# Patient Record
Sex: Female | Born: 1945 | Race: Black or African American | Hispanic: No | Smoking: Former smoker
Health system: Southern US, Community
[De-identification: ages and names within clinical notes are randomized; demographics above are authoritative.]

## PROBLEM LIST (undated history)

## (undated) DIAGNOSIS — Z9981 Dependence on supplemental oxygen: Secondary | ICD-10-CM

## (undated) DIAGNOSIS — N644 Mastodynia: Secondary | ICD-10-CM

## (undated) DIAGNOSIS — R238 Other skin changes: Secondary | ICD-10-CM

## (undated) DIAGNOSIS — E039 Hypothyroidism, unspecified: Secondary | ICD-10-CM

## (undated) DIAGNOSIS — R233 Spontaneous ecchymoses: Secondary | ICD-10-CM

## (undated) DIAGNOSIS — R0600 Dyspnea, unspecified: Secondary | ICD-10-CM

## (undated) DIAGNOSIS — N189 Chronic kidney disease, unspecified: Secondary | ICD-10-CM

## (undated) DIAGNOSIS — K069 Disorder of gingiva and edentulous alveolar ridge, unspecified: Secondary | ICD-10-CM

## (undated) DIAGNOSIS — E079 Disorder of thyroid, unspecified: Secondary | ICD-10-CM

## (undated) DIAGNOSIS — C801 Malignant (primary) neoplasm, unspecified: Secondary | ICD-10-CM

## (undated) DIAGNOSIS — J449 Chronic obstructive pulmonary disease, unspecified: Secondary | ICD-10-CM

## (undated) DIAGNOSIS — K469 Unspecified abdominal hernia without obstruction or gangrene: Secondary | ICD-10-CM

## (undated) DIAGNOSIS — M1990 Unspecified osteoarthritis, unspecified site: Secondary | ICD-10-CM

## (undated) DIAGNOSIS — M5090 Cervical disc disorder, unspecified, unspecified cervical region: Secondary | ICD-10-CM

## (undated) DIAGNOSIS — F419 Anxiety disorder, unspecified: Secondary | ICD-10-CM

## (undated) DIAGNOSIS — F319 Bipolar disorder, unspecified: Secondary | ICD-10-CM

## (undated) DIAGNOSIS — Z973 Presence of spectacles and contact lenses: Secondary | ICD-10-CM

## (undated) DIAGNOSIS — G4700 Insomnia, unspecified: Secondary | ICD-10-CM

## (undated) DIAGNOSIS — F32A Depression, unspecified: Secondary | ICD-10-CM

## (undated) DIAGNOSIS — Z9989 Dependence on other enabling machines and devices: Secondary | ICD-10-CM

## (undated) DIAGNOSIS — R7303 Prediabetes: Secondary | ICD-10-CM

## (undated) DIAGNOSIS — R635 Abnormal weight gain: Secondary | ICD-10-CM

## (undated) DIAGNOSIS — I809 Phlebitis and thrombophlebitis of unspecified site: Secondary | ICD-10-CM

## (undated) HISTORY — PX: THYROIDECTOMY: SHX17

## (undated) HISTORY — DX: Other skin changes: R23.8

## (undated) HISTORY — PX: ABDOMINAL HYSTERECTOMY: SHX81

## (undated) HISTORY — DX: Unspecified osteoarthritis, unspecified site: M19.90

## (undated) HISTORY — PX: TONSILLECTOMY: SUR1361

## (undated) HISTORY — PX: HERNIA REPAIR: SHX51

## (undated) HISTORY — PX: CATARACT EXTRACTION: SUR2

## (undated) HISTORY — DX: Unspecified abdominal hernia without obstruction or gangrene: K46.9

## (undated) HISTORY — DX: Insomnia, unspecified: G47.00

## (undated) HISTORY — DX: Phlebitis and thrombophlebitis of unspecified site: I80.9

## (undated) HISTORY — DX: Chronic obstructive pulmonary disease, unspecified: J44.9

## (undated) HISTORY — DX: Mastodynia: N64.4

## (undated) HISTORY — PX: SPINE SURGERY: SHX786

## (undated) HISTORY — PX: FOOT SURGERY: SHX648

## (undated) HISTORY — DX: Malignant (primary) neoplasm, unspecified: C80.1

## (undated) HISTORY — DX: Cervical disc disorder, unspecified, unspecified cervical region: M50.90

## (undated) HISTORY — DX: Abnormal weight gain: R63.5

## (undated) HISTORY — DX: Disorder of thyroid, unspecified: E07.9

## (undated) HISTORY — DX: Spontaneous ecchymoses: R23.3

## (undated) HISTORY — DX: Presence of spectacles and contact lenses: Z97.3

## (undated) HISTORY — DX: Disorder of gingiva and edentulous alveolar ridge, unspecified: K06.9

---

## 1997-10-22 ENCOUNTER — Ambulatory Visit (HOSPITAL_COMMUNITY): Admission: RE | Admit: 1997-10-22 | Discharge: 1997-10-22 | Payer: Self-pay | Admitting: *Deleted

## 1997-10-25 ENCOUNTER — Other Ambulatory Visit: Admission: RE | Admit: 1997-10-25 | Discharge: 1997-10-25 | Payer: Self-pay | Admitting: Obstetrics & Gynecology

## 1998-02-26 ENCOUNTER — Ambulatory Visit (HOSPITAL_COMMUNITY): Admission: RE | Admit: 1998-02-26 | Discharge: 1998-02-26 | Payer: Self-pay | Admitting: Gastroenterology

## 1998-03-06 ENCOUNTER — Ambulatory Visit (HOSPITAL_COMMUNITY): Admission: RE | Admit: 1998-03-06 | Discharge: 1998-03-06 | Payer: Self-pay | Admitting: Orthopedic Surgery

## 1998-06-28 ENCOUNTER — Ambulatory Visit (HOSPITAL_COMMUNITY): Admission: RE | Admit: 1998-06-28 | Discharge: 1998-06-29 | Payer: Self-pay | Admitting: Orthopaedic Surgery

## 1999-05-12 ENCOUNTER — Other Ambulatory Visit: Admission: RE | Admit: 1999-05-12 | Discharge: 1999-05-12 | Payer: Self-pay | Admitting: Obstetrics and Gynecology

## 1999-05-29 ENCOUNTER — Ambulatory Visit (HOSPITAL_COMMUNITY): Admission: RE | Admit: 1999-05-29 | Discharge: 1999-05-29 | Payer: Self-pay | Admitting: Obstetrics and Gynecology

## 1999-05-29 ENCOUNTER — Encounter: Payer: Self-pay | Admitting: Obstetrics and Gynecology

## 2000-01-28 ENCOUNTER — Ambulatory Visit (HOSPITAL_COMMUNITY): Admission: RE | Admit: 2000-01-28 | Discharge: 2000-01-28 | Payer: Self-pay | Admitting: Family Medicine

## 2000-01-28 ENCOUNTER — Encounter: Payer: Self-pay | Admitting: Family Medicine

## 2000-02-16 ENCOUNTER — Encounter (HOSPITAL_BASED_OUTPATIENT_CLINIC_OR_DEPARTMENT_OTHER): Payer: Self-pay | Admitting: General Surgery

## 2000-02-18 ENCOUNTER — Ambulatory Visit (HOSPITAL_COMMUNITY): Admission: RE | Admit: 2000-02-18 | Discharge: 2000-02-19 | Payer: Self-pay | Admitting: General Surgery

## 2000-02-18 ENCOUNTER — Encounter (INDEPENDENT_AMBULATORY_CARE_PROVIDER_SITE_OTHER): Payer: Self-pay | Admitting: *Deleted

## 2000-10-07 ENCOUNTER — Ambulatory Visit (HOSPITAL_COMMUNITY): Admission: RE | Admit: 2000-10-07 | Discharge: 2000-10-07 | Payer: Self-pay | Admitting: Obstetrics and Gynecology

## 2000-10-07 ENCOUNTER — Encounter: Payer: Self-pay | Admitting: Obstetrics and Gynecology

## 2000-12-14 ENCOUNTER — Other Ambulatory Visit: Admission: RE | Admit: 2000-12-14 | Discharge: 2000-12-14 | Payer: Self-pay | Admitting: Obstetrics and Gynecology

## 2001-07-20 DIAGNOSIS — C3490 Malignant neoplasm of unspecified part of unspecified bronchus or lung: Secondary | ICD-10-CM | POA: Insufficient documentation

## 2001-07-20 DIAGNOSIS — C801 Malignant (primary) neoplasm, unspecified: Secondary | ICD-10-CM

## 2001-07-20 HISTORY — DX: Malignant (primary) neoplasm, unspecified: C80.1

## 2001-07-20 HISTORY — PX: LUNG LOBECTOMY: SHX167

## 2002-01-13 ENCOUNTER — Encounter: Admission: RE | Admit: 2002-01-13 | Discharge: 2002-01-13 | Payer: Self-pay | Admitting: Obstetrics and Gynecology

## 2002-01-13 ENCOUNTER — Encounter: Payer: Self-pay | Admitting: Obstetrics and Gynecology

## 2002-01-20 ENCOUNTER — Encounter (INDEPENDENT_AMBULATORY_CARE_PROVIDER_SITE_OTHER): Payer: Self-pay | Admitting: *Deleted

## 2002-01-20 ENCOUNTER — Encounter: Payer: Self-pay | Admitting: *Deleted

## 2002-01-21 ENCOUNTER — Encounter: Payer: Self-pay | Admitting: Emergency Medicine

## 2002-01-21 ENCOUNTER — Inpatient Hospital Stay (HOSPITAL_COMMUNITY): Admission: EM | Admit: 2002-01-21 | Discharge: 2002-02-02 | Payer: Self-pay | Admitting: Emergency Medicine

## 2002-01-23 ENCOUNTER — Encounter: Payer: Self-pay | Admitting: Family Medicine

## 2002-01-26 ENCOUNTER — Encounter: Payer: Self-pay | Admitting: Family Medicine

## 2002-01-27 ENCOUNTER — Encounter: Payer: Self-pay | Admitting: Thoracic Surgery

## 2002-01-28 ENCOUNTER — Encounter: Payer: Self-pay | Admitting: Thoracic Surgery

## 2002-01-29 ENCOUNTER — Encounter: Payer: Self-pay | Admitting: Thoracic Surgery

## 2002-01-30 ENCOUNTER — Encounter: Payer: Self-pay | Admitting: Thoracic Surgery

## 2002-01-30 ENCOUNTER — Encounter: Payer: Self-pay | Admitting: Family Medicine

## 2002-01-31 ENCOUNTER — Encounter: Payer: Self-pay | Admitting: Thoracic Surgery

## 2002-02-01 ENCOUNTER — Encounter: Payer: Self-pay | Admitting: Thoracic Surgery

## 2002-02-08 ENCOUNTER — Encounter: Payer: Self-pay | Admitting: Thoracic Surgery

## 2002-02-08 ENCOUNTER — Encounter: Admission: RE | Admit: 2002-02-08 | Discharge: 2002-02-08 | Payer: Self-pay | Admitting: Thoracic Surgery

## 2002-03-09 ENCOUNTER — Encounter: Payer: Self-pay | Admitting: Thoracic Surgery

## 2002-03-09 ENCOUNTER — Encounter: Admission: RE | Admit: 2002-03-09 | Discharge: 2002-03-09 | Payer: Self-pay | Admitting: Thoracic Surgery

## 2002-03-24 ENCOUNTER — Encounter: Payer: Self-pay | Admitting: Thoracic Surgery

## 2002-03-24 ENCOUNTER — Ambulatory Visit (HOSPITAL_COMMUNITY): Admission: RE | Admit: 2002-03-24 | Discharge: 2002-03-24 | Payer: Self-pay | Admitting: Thoracic Surgery

## 2002-05-01 ENCOUNTER — Ambulatory Visit: Admission: RE | Admit: 2002-05-01 | Discharge: 2002-05-01 | Payer: Self-pay | Admitting: Hematology & Oncology

## 2002-05-01 ENCOUNTER — Encounter: Payer: Self-pay | Admitting: Hematology & Oncology

## 2002-05-24 ENCOUNTER — Other Ambulatory Visit: Admission: RE | Admit: 2002-05-24 | Discharge: 2002-05-24 | Payer: Self-pay | Admitting: Obstetrics and Gynecology

## 2002-06-06 ENCOUNTER — Encounter: Admission: RE | Admit: 2002-06-06 | Discharge: 2002-06-06 | Payer: Self-pay | Admitting: Thoracic Surgery

## 2002-06-06 ENCOUNTER — Encounter: Payer: Self-pay | Admitting: Thoracic Surgery

## 2002-08-03 ENCOUNTER — Ambulatory Visit (HOSPITAL_COMMUNITY): Admission: RE | Admit: 2002-08-03 | Discharge: 2002-08-03 | Payer: Self-pay | Admitting: Hematology & Oncology

## 2002-08-03 ENCOUNTER — Encounter: Payer: Self-pay | Admitting: Hematology & Oncology

## 2002-11-09 ENCOUNTER — Encounter: Payer: Self-pay | Admitting: Hematology & Oncology

## 2002-11-09 ENCOUNTER — Ambulatory Visit (HOSPITAL_COMMUNITY): Admission: RE | Admit: 2002-11-09 | Discharge: 2002-11-09 | Payer: Self-pay | Admitting: Hematology & Oncology

## 2002-11-28 ENCOUNTER — Emergency Department (HOSPITAL_COMMUNITY): Admission: EM | Admit: 2002-11-28 | Discharge: 2002-11-29 | Payer: Self-pay | Admitting: *Deleted

## 2002-11-28 ENCOUNTER — Encounter: Payer: Self-pay | Admitting: Emergency Medicine

## 2003-01-16 ENCOUNTER — Encounter: Payer: Self-pay | Admitting: Obstetrics and Gynecology

## 2003-01-16 ENCOUNTER — Ambulatory Visit (HOSPITAL_COMMUNITY): Admission: RE | Admit: 2003-01-16 | Discharge: 2003-01-16 | Payer: Self-pay | Admitting: Obstetrics and Gynecology

## 2003-02-05 ENCOUNTER — Ambulatory Visit (HOSPITAL_COMMUNITY): Admission: RE | Admit: 2003-02-05 | Discharge: 2003-02-05 | Payer: Self-pay | Admitting: Hematology & Oncology

## 2003-02-05 ENCOUNTER — Encounter: Payer: Self-pay | Admitting: Hematology & Oncology

## 2003-05-07 ENCOUNTER — Ambulatory Visit (HOSPITAL_COMMUNITY): Admission: RE | Admit: 2003-05-07 | Discharge: 2003-05-07 | Payer: Self-pay | Admitting: Hematology & Oncology

## 2003-05-07 ENCOUNTER — Encounter: Payer: Self-pay | Admitting: Hematology & Oncology

## 2003-06-18 ENCOUNTER — Ambulatory Visit (HOSPITAL_COMMUNITY): Admission: RE | Admit: 2003-06-18 | Discharge: 2003-06-18 | Payer: Self-pay | Admitting: Orthopedic Surgery

## 2003-06-18 ENCOUNTER — Ambulatory Visit (HOSPITAL_COMMUNITY): Admission: RE | Admit: 2003-06-18 | Discharge: 2003-06-18 | Payer: Self-pay | Admitting: Orthopaedic Surgery

## 2003-06-19 ENCOUNTER — Ambulatory Visit (HOSPITAL_COMMUNITY): Admission: RE | Admit: 2003-06-19 | Discharge: 2003-06-19 | Payer: Self-pay | Admitting: Hematology & Oncology

## 2003-08-03 ENCOUNTER — Encounter: Admission: RE | Admit: 2003-08-03 | Discharge: 2003-08-03 | Payer: Self-pay | Admitting: Orthopaedic Surgery

## 2003-08-17 ENCOUNTER — Encounter: Admission: RE | Admit: 2003-08-17 | Discharge: 2003-08-17 | Payer: Self-pay | Admitting: Orthopaedic Surgery

## 2003-09-12 ENCOUNTER — Ambulatory Visit (HOSPITAL_COMMUNITY): Admission: RE | Admit: 2003-09-12 | Discharge: 2003-09-12 | Payer: Self-pay | Admitting: Hematology & Oncology

## 2003-11-16 ENCOUNTER — Ambulatory Visit (HOSPITAL_COMMUNITY): Admission: RE | Admit: 2003-11-16 | Discharge: 2003-11-16 | Payer: Self-pay | Admitting: Hematology & Oncology

## 2003-12-25 ENCOUNTER — Ambulatory Visit (HOSPITAL_COMMUNITY): Admission: RE | Admit: 2003-12-25 | Discharge: 2003-12-25 | Payer: Self-pay | Admitting: Internal Medicine

## 2004-01-17 ENCOUNTER — Ambulatory Visit (HOSPITAL_COMMUNITY): Admission: RE | Admit: 2004-01-17 | Discharge: 2004-01-17 | Payer: Self-pay | Admitting: Hematology & Oncology

## 2004-04-08 ENCOUNTER — Ambulatory Visit (HOSPITAL_COMMUNITY): Admission: RE | Admit: 2004-04-08 | Discharge: 2004-04-08 | Payer: Self-pay | Admitting: Hematology & Oncology

## 2004-07-15 ENCOUNTER — Encounter: Admission: RE | Admit: 2004-07-15 | Discharge: 2004-10-13 | Payer: Self-pay | Admitting: Family Medicine

## 2004-07-28 ENCOUNTER — Ambulatory Visit (HOSPITAL_COMMUNITY): Admission: RE | Admit: 2004-07-28 | Discharge: 2004-07-28 | Payer: Self-pay | Admitting: Hematology & Oncology

## 2004-08-04 ENCOUNTER — Ambulatory Visit: Payer: Self-pay | Admitting: Hematology & Oncology

## 2004-09-02 ENCOUNTER — Ambulatory Visit: Payer: Self-pay | Admitting: Internal Medicine

## 2004-11-06 ENCOUNTER — Encounter: Admission: RE | Admit: 2004-11-06 | Discharge: 2005-02-04 | Payer: Self-pay | Admitting: Family Medicine

## 2005-01-07 ENCOUNTER — Ambulatory Visit: Payer: Self-pay | Admitting: Hematology & Oncology

## 2005-01-12 ENCOUNTER — Ambulatory Visit (HOSPITAL_COMMUNITY): Admission: RE | Admit: 2005-01-12 | Discharge: 2005-01-12 | Payer: Self-pay | Admitting: Hematology & Oncology

## 2005-04-28 ENCOUNTER — Emergency Department (HOSPITAL_COMMUNITY): Admission: EM | Admit: 2005-04-28 | Discharge: 2005-04-28 | Payer: Self-pay | Admitting: Emergency Medicine

## 2005-05-04 ENCOUNTER — Ambulatory Visit (HOSPITAL_COMMUNITY): Admission: RE | Admit: 2005-05-04 | Discharge: 2005-05-04 | Payer: Self-pay | Admitting: Cardiology

## 2005-06-25 ENCOUNTER — Ambulatory Visit: Payer: Self-pay | Admitting: Hematology & Oncology

## 2005-06-30 ENCOUNTER — Ambulatory Visit (HOSPITAL_COMMUNITY): Admission: RE | Admit: 2005-06-30 | Discharge: 2005-06-30 | Payer: Self-pay | Admitting: Hematology & Oncology

## 2005-07-09 ENCOUNTER — Ambulatory Visit (HOSPITAL_COMMUNITY): Admission: RE | Admit: 2005-07-09 | Discharge: 2005-07-09 | Payer: Self-pay | Admitting: Hematology & Oncology

## 2005-09-21 ENCOUNTER — Other Ambulatory Visit: Admission: RE | Admit: 2005-09-21 | Discharge: 2005-09-21 | Payer: Self-pay | Admitting: Obstetrics and Gynecology

## 2005-10-07 ENCOUNTER — Ambulatory Visit: Payer: Self-pay | Admitting: Internal Medicine

## 2006-01-04 ENCOUNTER — Ambulatory Visit (HOSPITAL_COMMUNITY): Admission: RE | Admit: 2006-01-04 | Discharge: 2006-01-04 | Payer: Self-pay | Admitting: Gastroenterology

## 2006-01-12 ENCOUNTER — Ambulatory Visit: Payer: Self-pay | Admitting: Hematology & Oncology

## 2006-01-21 LAB — CBC WITH DIFFERENTIAL/PLATELET
Basophils Absolute: 0 10*3/uL (ref 0.0–0.1)
EOS%: 2.7 % (ref 0.0–7.0)
LYMPH%: 13.8 % — ABNORMAL LOW (ref 14.0–48.0)
MCH: 26.5 pg (ref 26.0–34.0)
MCV: 80.4 fL — ABNORMAL LOW (ref 81.0–101.0)
MONO%: 10.5 % (ref 0.0–13.0)
Platelets: 234 10*3/uL (ref 145–400)
RBC: 5.65 10*6/uL — ABNORMAL HIGH (ref 3.70–5.32)
RDW: 16.3 % — ABNORMAL HIGH (ref 11.3–14.5)

## 2006-01-21 LAB — COMPREHENSIVE METABOLIC PANEL
AST: 17 U/L (ref 0–37)
Albumin: 4 g/dL (ref 3.5–5.2)
Alkaline Phosphatase: 66 U/L (ref 39–117)
BUN: 20 mg/dL (ref 6–23)
Potassium: 3.8 mEq/L (ref 3.5–5.3)
Sodium: 141 mEq/L (ref 135–145)
Total Bilirubin: 0.4 mg/dL (ref 0.3–1.2)

## 2006-01-21 LAB — LACTATE DEHYDROGENASE: LDH: 190 U/L (ref 94–250)

## 2006-01-25 ENCOUNTER — Ambulatory Visit (HOSPITAL_COMMUNITY): Admission: RE | Admit: 2006-01-25 | Discharge: 2006-01-25 | Payer: Self-pay | Admitting: Hematology & Oncology

## 2006-05-26 ENCOUNTER — Ambulatory Visit: Payer: Self-pay | Admitting: Internal Medicine

## 2006-07-21 ENCOUNTER — Ambulatory Visit: Payer: Self-pay | Admitting: Internal Medicine

## 2006-11-29 ENCOUNTER — Encounter: Admission: RE | Admit: 2006-11-29 | Discharge: 2006-11-29 | Payer: Self-pay | Admitting: Orthopaedic Surgery

## 2007-01-01 ENCOUNTER — Encounter: Admission: RE | Admit: 2007-01-01 | Discharge: 2007-01-01 | Payer: Self-pay | Admitting: Cardiology

## 2007-01-27 ENCOUNTER — Ambulatory Visit: Payer: Self-pay | Admitting: Hematology & Oncology

## 2007-01-31 LAB — CBC WITH DIFFERENTIAL/PLATELET
Basophils Absolute: 0 10*3/uL (ref 0.0–0.1)
Eosinophils Absolute: 0.2 10*3/uL (ref 0.0–0.5)
HGB: 15 g/dL (ref 11.6–15.9)
MONO#: 0.5 10*3/uL (ref 0.1–0.9)
NEUT#: 3.6 10*3/uL (ref 1.5–6.5)
RBC: 5.57 10*6/uL — ABNORMAL HIGH (ref 3.70–5.32)
RDW: 15.7 % — ABNORMAL HIGH (ref 11.3–14.5)
WBC: 5.3 10*3/uL (ref 3.9–10.0)
lymph#: 1 10*3/uL (ref 0.9–3.3)

## 2007-01-31 LAB — COMPREHENSIVE METABOLIC PANEL
Albumin: 4 g/dL (ref 3.5–5.2)
BUN: 18 mg/dL (ref 6–23)
Calcium: 8.9 mg/dL (ref 8.4–10.5)
Chloride: 109 mEq/L (ref 96–112)
Glucose, Bld: 108 mg/dL — ABNORMAL HIGH (ref 70–99)
Potassium: 4 mEq/L (ref 3.5–5.3)
Sodium: 144 mEq/L (ref 135–145)
Total Protein: 6.5 g/dL (ref 6.0–8.3)

## 2007-05-11 ENCOUNTER — Ambulatory Visit (HOSPITAL_COMMUNITY): Admission: RE | Admit: 2007-05-11 | Discharge: 2007-05-11 | Payer: Self-pay | Admitting: Family Medicine

## 2007-06-26 ENCOUNTER — Encounter: Admission: RE | Admit: 2007-06-26 | Discharge: 2007-06-26 | Payer: Self-pay | Admitting: Orthopaedic Surgery

## 2007-08-08 ENCOUNTER — Ambulatory Visit (HOSPITAL_COMMUNITY): Admission: RE | Admit: 2007-08-08 | Discharge: 2007-08-08 | Payer: Self-pay | Admitting: Orthopaedic Surgery

## 2007-08-13 ENCOUNTER — Encounter: Admission: RE | Admit: 2007-08-13 | Discharge: 2007-08-13 | Payer: Self-pay | Admitting: Orthopaedic Surgery

## 2007-09-07 ENCOUNTER — Encounter: Admission: RE | Admit: 2007-09-07 | Discharge: 2007-09-07 | Payer: Self-pay | Admitting: Family Medicine

## 2007-10-13 ENCOUNTER — Encounter: Payer: Self-pay | Admitting: Internal Medicine

## 2007-11-01 ENCOUNTER — Ambulatory Visit (HOSPITAL_COMMUNITY): Admission: RE | Admit: 2007-11-01 | Discharge: 2007-11-01 | Payer: Self-pay | Admitting: Obstetrics and Gynecology

## 2007-11-10 ENCOUNTER — Ambulatory Visit (HOSPITAL_COMMUNITY): Admission: RE | Admit: 2007-11-10 | Discharge: 2007-11-10 | Payer: Self-pay | Admitting: Obstetrics and Gynecology

## 2007-11-10 ENCOUNTER — Encounter: Admission: RE | Admit: 2007-11-10 | Discharge: 2007-11-10 | Payer: Self-pay | Admitting: Surgery

## 2008-06-11 ENCOUNTER — Encounter: Admission: RE | Admit: 2008-06-11 | Discharge: 2008-06-11 | Payer: Self-pay | Admitting: Surgery

## 2008-06-21 ENCOUNTER — Encounter: Payer: Self-pay | Admitting: Internal Medicine

## 2008-06-25 ENCOUNTER — Ambulatory Visit (HOSPITAL_COMMUNITY): Admission: RE | Admit: 2008-06-25 | Discharge: 2008-06-25 | Payer: Self-pay | Admitting: Surgery

## 2008-06-26 ENCOUNTER — Ambulatory Visit: Payer: Self-pay | Admitting: Internal Medicine

## 2008-06-26 ENCOUNTER — Telehealth (INDEPENDENT_AMBULATORY_CARE_PROVIDER_SITE_OTHER): Payer: Self-pay | Admitting: *Deleted

## 2008-06-26 DIAGNOSIS — J449 Chronic obstructive pulmonary disease, unspecified: Secondary | ICD-10-CM | POA: Insufficient documentation

## 2008-06-29 ENCOUNTER — Encounter: Admission: RE | Admit: 2008-06-29 | Discharge: 2008-06-29 | Payer: Self-pay | Admitting: Surgery

## 2008-07-01 DIAGNOSIS — E119 Type 2 diabetes mellitus without complications: Secondary | ICD-10-CM | POA: Insufficient documentation

## 2008-07-05 ENCOUNTER — Ambulatory Visit: Payer: Self-pay | Admitting: Internal Medicine

## 2008-07-05 DIAGNOSIS — R0602 Shortness of breath: Secondary | ICD-10-CM | POA: Insufficient documentation

## 2008-07-06 ENCOUNTER — Encounter (HOSPITAL_COMMUNITY): Admission: RE | Admit: 2008-07-06 | Discharge: 2008-07-17 | Payer: Self-pay | Admitting: Cardiology

## 2008-07-16 ENCOUNTER — Telehealth: Payer: Self-pay | Admitting: Internal Medicine

## 2008-07-17 ENCOUNTER — Telehealth: Payer: Self-pay | Admitting: Internal Medicine

## 2008-08-20 HISTORY — PX: LAPAROSCOPIC GASTRIC BANDING: SHX1100

## 2008-08-29 ENCOUNTER — Encounter: Payer: Self-pay | Admitting: Internal Medicine

## 2008-09-04 ENCOUNTER — Ambulatory Visit (HOSPITAL_COMMUNITY): Admission: RE | Admit: 2008-09-04 | Discharge: 2008-09-05 | Payer: Self-pay | Admitting: Surgery

## 2008-09-21 ENCOUNTER — Encounter: Admission: RE | Admit: 2008-09-21 | Discharge: 2008-09-21 | Payer: Self-pay | Admitting: Surgery

## 2008-10-25 ENCOUNTER — Encounter: Payer: Self-pay | Admitting: Internal Medicine

## 2008-10-29 ENCOUNTER — Telehealth: Payer: Self-pay | Admitting: Internal Medicine

## 2008-12-03 ENCOUNTER — Ambulatory Visit (HOSPITAL_COMMUNITY): Admission: RE | Admit: 2008-12-03 | Discharge: 2008-12-03 | Payer: Self-pay | Admitting: Obstetrics and Gynecology

## 2009-03-18 ENCOUNTER — Encounter: Payer: Self-pay | Admitting: Internal Medicine

## 2009-03-18 ENCOUNTER — Ambulatory Visit (HOSPITAL_COMMUNITY): Admission: RE | Admit: 2009-03-18 | Discharge: 2009-03-18 | Payer: Self-pay | Admitting: Cardiology

## 2009-05-13 ENCOUNTER — Telehealth (INDEPENDENT_AMBULATORY_CARE_PROVIDER_SITE_OTHER): Payer: Self-pay | Admitting: *Deleted

## 2009-05-13 ENCOUNTER — Ambulatory Visit: Payer: Self-pay | Admitting: Internal Medicine

## 2009-05-15 ENCOUNTER — Telehealth: Payer: Self-pay | Admitting: Internal Medicine

## 2009-05-16 ENCOUNTER — Encounter: Admission: RE | Admit: 2009-05-16 | Discharge: 2009-05-16 | Payer: Self-pay | Admitting: Family Medicine

## 2009-06-17 ENCOUNTER — Ambulatory Visit: Payer: Self-pay | Admitting: Internal Medicine

## 2009-09-06 ENCOUNTER — Telehealth: Payer: Self-pay | Admitting: Internal Medicine

## 2009-10-03 ENCOUNTER — Telehealth: Payer: Self-pay | Admitting: Internal Medicine

## 2009-10-23 ENCOUNTER — Telehealth (INDEPENDENT_AMBULATORY_CARE_PROVIDER_SITE_OTHER): Payer: Self-pay | Admitting: *Deleted

## 2009-11-13 ENCOUNTER — Encounter (HOSPITAL_COMMUNITY): Admission: RE | Admit: 2009-11-13 | Discharge: 2010-01-13 | Payer: Self-pay | Admitting: Internal Medicine

## 2009-12-04 ENCOUNTER — Ambulatory Visit (HOSPITAL_COMMUNITY): Admission: RE | Admit: 2009-12-04 | Discharge: 2009-12-04 | Payer: Self-pay | Admitting: Family Medicine

## 2009-12-10 ENCOUNTER — Encounter: Admission: RE | Admit: 2009-12-10 | Discharge: 2009-12-10 | Payer: Self-pay | Admitting: Family Medicine

## 2009-12-12 ENCOUNTER — Telehealth: Payer: Self-pay | Admitting: Internal Medicine

## 2009-12-13 ENCOUNTER — Ambulatory Visit: Payer: Self-pay | Admitting: Internal Medicine

## 2009-12-13 DIAGNOSIS — R21 Rash and other nonspecific skin eruption: Secondary | ICD-10-CM | POA: Insufficient documentation

## 2009-12-31 ENCOUNTER — Encounter: Admission: RE | Admit: 2009-12-31 | Discharge: 2009-12-31 | Payer: Self-pay | Admitting: Family Medicine

## 2009-12-31 IMAGING — CR DG ABDOMEN 1V
1 series · 1 of 1 positions shown · non-contrast
Comparison: None

CLINICAL DATA: Gastric banding for morbid obesity.

ABDOMEN - 1 VIEW

[t abdomen supine]
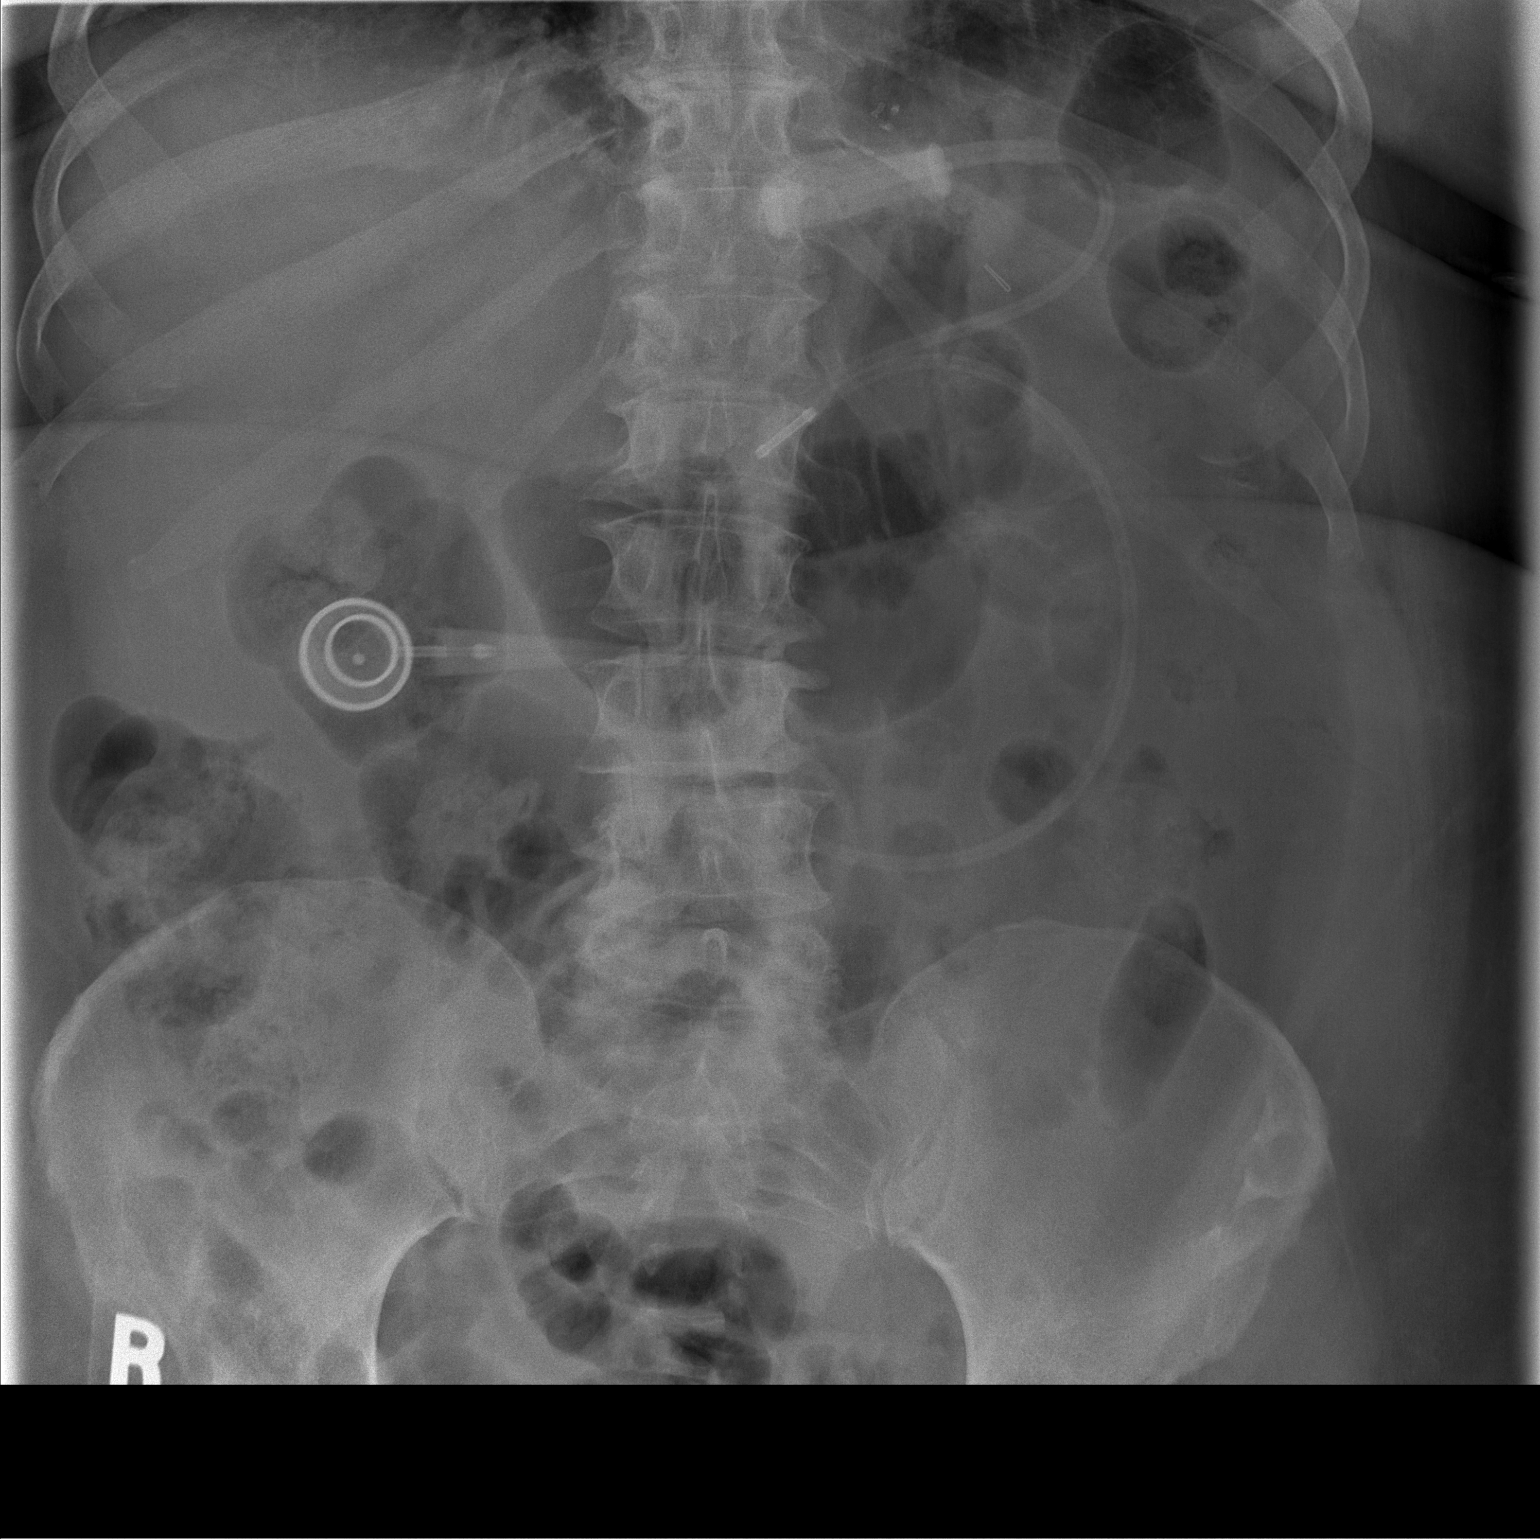

[1 of 1 positions shown; findings below may reference images not displayed]

FINDINGS: Gastric band is in position and it is slightly inclined.
It is nearly at the 3 o'clock to 9 o'clock position.  Bowel gas
pattern is unremarkable.
IMPRESSION: Unremarkable appearance of the gastric lap band.  No acute
findings.

## 2010-01-14 ENCOUNTER — Encounter (HOSPITAL_COMMUNITY): Admission: RE | Admit: 2010-01-14 | Discharge: 2010-03-20 | Payer: Self-pay | Admitting: Internal Medicine

## 2010-03-10 ENCOUNTER — Encounter: Payer: Self-pay | Admitting: Internal Medicine

## 2010-06-17 ENCOUNTER — Encounter: Admission: RE | Admit: 2010-06-17 | Discharge: 2010-06-17 | Payer: Self-pay | Admitting: Obstetrics and Gynecology

## 2010-06-18 ENCOUNTER — Encounter: Admission: RE | Admit: 2010-06-18 | Discharge: 2010-06-18 | Payer: Self-pay | Admitting: Family Medicine

## 2010-06-25 ENCOUNTER — Telehealth: Payer: Self-pay | Admitting: Internal Medicine

## 2010-06-27 ENCOUNTER — Ambulatory Visit: Payer: Self-pay | Admitting: Internal Medicine

## 2010-06-27 ENCOUNTER — Telehealth (INDEPENDENT_AMBULATORY_CARE_PROVIDER_SITE_OTHER): Payer: Self-pay | Admitting: *Deleted

## 2010-06-27 DIAGNOSIS — Z85118 Personal history of other malignant neoplasm of bronchus and lung: Secondary | ICD-10-CM | POA: Insufficient documentation

## 2010-07-01 ENCOUNTER — Telehealth (INDEPENDENT_AMBULATORY_CARE_PROVIDER_SITE_OTHER): Payer: Self-pay | Admitting: *Deleted

## 2010-08-09 ENCOUNTER — Encounter: Payer: Self-pay | Admitting: Hematology & Oncology

## 2010-08-09 ENCOUNTER — Encounter: Payer: Self-pay | Admitting: Family Medicine

## 2010-08-10 ENCOUNTER — Encounter: Payer: Self-pay | Admitting: Family Medicine

## 2010-08-10 ENCOUNTER — Encounter: Payer: Self-pay | Admitting: Orthopaedic Surgery

## 2010-08-10 ENCOUNTER — Encounter: Payer: Self-pay | Admitting: Gastroenterology

## 2010-08-11 ENCOUNTER — Encounter: Payer: Self-pay | Admitting: Cardiology

## 2010-08-19 NOTE — Progress Notes (Signed)
Summary: pulm rehab/ still waiting  Phone Note Call from Patient Call back at Home Phone [phone]   Caller: Patient Call For: pcc Summary of Call: pt has not heard from anyone at [facility]. she needs to do this asap in order to meet her deduction/ have coverage for this.  Initial call taken by: Tivis Ringer, CNA,  October 23, 2009 10:16 AM  Follow-up for Phone Call        spoke to pt she is aware she will hear from them as soon as they get rehab approved Follow-up by: Oneita Jolly,  October 23, 2009 10:51 AM

## 2010-08-19 NOTE — Progress Notes (Signed)
Summary: rash-  Phone Note Call from Patient Call back at Home Phone [phone]   Caller: Patient Call For: Indonesia Mckeough Reason for Call: Talk to Nurse Summary of Call: pt has a rash, above her skin, primarily all over,back of arm worse x 1 month, itches, red.  Initial call taken by: Eugene Gavia,  Dec 12, 2009 9:29 AM  Follow-up for Phone Call        [facility].Carron Curie CMA  Dec 12, 2009 10:15 AM Pt had appt to see CY on 12-13-09 fro rash. Carron Curie CMA  Dec 17, 2009 12:02 PM

## 2010-08-19 NOTE — Progress Notes (Signed)
Summary: cough/?xray  Phone Note Call from Patient Call back at Home Phone [phone]   Caller: Patient Call For: young Reason for Call: Talk to Nurse Summary of Call: Patient c/o hacking cough, sputum red in color, started yesterday.  Requesting xray. Initial call taken by: Lehman Prom,  June 27, 2010 8:10 AM  Follow-up for Phone Call        called spoke with patient who c/o wheezing, DOE, prod cough that has gone from clear to pink overnight, "very little nasal drainage" x1day. denies f/c/s.  wonders if she needs a cxr.  also would like promethazine/codeine cough syrup as this has helped in the past.  please advise, thanks!  nkda.  [facility] [address]  will be going out, please call: [phone].  Follow-up by: Boone Master CNA/MA,  June 27, 2010 9:03 AM  Additional Follow-up for Phone Call Additional follow up Details #1::        Per CDY-okay for patient to come in for CXR-offer workin slot as well for patient to be seen(today at 2pm). CXR is before appt-use hemoptysis and cough as dx codes.   Okay to call in Phenergan/codeine cough syrup #254ml 1 teaspoon qid as needed cough no refills.Reynaldo Minium CMA  June 27, 2010 10:19 AM     Additional Follow-up for Phone Call Additional follow up Details #2::    Spoke with pt and sched appt for this pm at 2 per [facility], cxr ordered in idx and EMR, pt aware to come in at least 20 min prior to appt to go down for cxr. Follow-up by: Vernie Murders,  June 27, 2010 10:39 AM

## 2010-08-19 NOTE — Progress Notes (Signed)
Summary: pulm rehab  Phone Note Call from Patient Call back at Home Phone [phone]   Caller: Patient Call For: young Reason for Call: Talk to Nurse Summary of Call: pt needs to be set up for pUlmonary Rehab again.Marland Kitchen/ Initial call taken by: Eugene Gavia,  October 03, 2009 9:29 AM  Follow-up for Phone Call        order was placed in 11/10 for pulmonary rehab, but for personal reasons pt was unable to attend. she states she is ready to attend now. Pelase adise if ok to place order. Carron Curie CMA  October 03, 2009 10:57 AM   Additional Follow-up for Phone Call Additional follow up Details #1::        Ok to start ;place order for pcc.Reynaldo Minium CMA  October 03, 2009 11:01 AM   pcc order placed. Carron Curie CMA  October 03, 2009 11:14 AM

## 2010-08-19 NOTE — Letter (Signed)
Summary: [facility]   Imported By: Sherian Rein 12/30/2009 11:07:29  _____________________________________________________________________  External Attachment:    Type:   Image     Comment:   External Document

## 2010-08-19 NOTE — Miscellaneous (Signed)
Summary: [facility]   Imported By: Sherian Rein 03/21/2010 08:51:54  _____________________________________________________________________  External Attachment:    Type:   Image     Comment:   External Document

## 2010-08-19 NOTE — Assessment & Plan Note (Signed)
Summary: rash/apc   Primary Provider/Referring Provider:  Renaye Rakers  CC:  Unexplained Rash on back, chest, arms, and and face.Marland Kitchen  History of Present Illness: 06/26/08-COPD, Hx Left LLresection , Considering lap band surgery- needs note. Noting easier DOE, not at rest and no increase in cough orwheeze. No chest pain, but some tightnes.Rarely has awakened with palpitiation.  Harder in hot weather. No known heart condition. No phlegm Daily Aleve for fibromyalgia. Daily Xanax for nerves. Had CX  2009/05/23- COPD, Hx LLL resection. 3 weeks cough, began with sore throat. Non purulent but has brought up some streaks of blood in clear mucus. Sharp pain through left breast with cough. Denies fever, chills, glands, GI upset. She had lap band surgery with 30 lb wt loss - Feb, 2010. Had flu vax. Proair and her neb do help. CXR 8/30//10- reviewed- post op changes on left, no mass, NAD.Marland Kitchen  June 17, 2009- COPD, Hx LLL resection Feel well now with bronchitis resolved and no more heme. Still happy after lap band surgery. She asked about progression of her moderate COPD and we reviewed PFT from last year, discussing exercise and weight loss. Usually just minor throat clearing of clear phlegm. Somedays a little wheeze.  had flu vax.  Dec 13, 2009- COPD, Hx LLL resection/ adenoCa She is pleased with pulmonary rehab. Dry cough x 1 month treated with cough drop. Denies wheeze, postnasal drip, reflux.  New problem- 1 month ago pruritic rash upper anterior chest, now moving to backs of arms, face and back. Switched to [employer] and taking benadryl to sleep. Denies new med.  Consider when to update CXR, PFT.  Preventive Screening-Counseling & Management  Alcohol-Tobacco     Smoking Status: quit > 6 months  Current Medications (verified): 1)  Januvia 100 Mg Tabs (Sitagliptin Phosphate) .... Take 1 By Mouth Once Daily 2)  Centrum Silver  Tabs (Multiple Vitamins-Minerals) .... Take 1 By Mouth Once  Daily 3)  Calcium 500 Mg Tabs (Calcium Carbonate) .... Take 1 By Mouth Two Times A Day 4)  Albuterol Sulfate (2.5 Mg/101ml) 0.083% Nebu (Albuterol Sulfate) .Marland Kitchen.. 1 Three Times A Day As Needed 5)  Proair Hfa 108 (90 Base) Mcg/act Aers (Albuterol Sulfate) .... 2 Puffs Four Times A Day As Needed 6)  Aspirin 81 Mg  Tabs (Aspirin) .... Take 1 Tablet By Mouth Once A Day 7)  Aleve 220 Mg Tabs (Naproxen Sodium) .... Take 2 Tablet By Mouth Once A Day  Allergies (verified): No Known Drug Allergies  Past History:  Past Medical History: Last updated: 06/26/2008 Fibromyalgia COPD Diabetes, Type 2 Degenerative joint disease Hx bronchogenic adenocancer/ left lower lobectomy/ chemo 2003 Hx morbid obesity  Past Surgical History: Last updated: 05-23-09 Total Abdominal Hysterectomy Tonsillectomy parathyroid cervical spine fusion heal spur carpal tunnel bilateral,  left lower lobectomy for adenoca 2003, chemo Lap band bariatric surgery Feb 2010  Family History: Last updated: 05-23-09 Father- died CVA Mother- living, hx CVA  Social History: Last updated: May 23, 2009 Patient states former smoker. 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  The patient denies shortness of breath with activity, shortness of breath at rest, productive cough, chest pain, irregular heartbeats, acid heartburn,  indigestion, loss of appetite, weight change, abdominal pain, difficulty swallowing, sore throat, and tooth/dental problems.    Vital Signs:  Patient profile:   65 year old female Height:      61 inches Weight:      229 pounds BMI:     43.43 O2 Sat:      100 % on Room air Pulse rate:   65 / minute BP sitting:   110 /  78  (left arm) Cuff size:   large  Vitals Entered By: Reynaldo Minium CMA (Dec 13, 2009 4:14 PM)  O2 Flow:  Room air  Physical Exam  Additional Exam:  General: A/Ox3; pleasant and cooperative, NAD ,morbidly obese SKIN: pink area right deltoid identified as area of pruritic rash, no hot, scaling or excoriated. NODES: no lymphadenopathy HEENT: Rondo/AT, EOM- WNL, Conjuctivae- clear, PERRLA, TM-WNL, Nose- clear, Throat- clear and wnl, Mallampati III NECK: Supple w/ fair ROM, JVD- none, normal carotid impulses w/o bruits Thyroid- , old left cervical surgery scar CHEST: very quiet with no cough or wheeze HEART: RRR, no m/g/r heard ABDOMEN: Soft  KVQ:QVZD, nl pulses, no edema  NEURO: Grossly intact to observation      Impression & Recommendations:  Problem # 1:  COPD (ICD-496) She remains off cigarettes and has done fairly well with some seasonal variation. Dry cough has been somewhat increased lately- blaming Spring pollen. Doubt infection. We will want to update PFT.  Problem # 2:  SKIN RASH (ICD-782.1) Diabetic so we considered fungal dermatitis and she will try bathing with dandruff shampoo. Suggest nonsedating antihistamines. There is not  much visible dermatitis. Avoid fragrance and enzyme laundry products.  Other Orders: Est. Patient Level IV (63875)  Patient Instructions: 1)  Please schedule a follow-up appointment in 6 months. 2)  Avoid laundry and bathing products with fragrances and enzyme cleaners for now. 3)  Try nonsedating otc antihistamine for itching- 4)  Claritin/ loratadine 5)  Allegra/ fexofenadine 6)  Try using a dandruff shampoo as a body wash for next few weeks- Head and Shoulders, Selsun Blue. 7)  Consider dermatologist if rash doesn't clear

## 2010-08-19 NOTE — Progress Notes (Signed)
Summary: Losing voice and congestion  Phone Note Call from Patient Call back at Home Phone [phone]   Caller: Patient Call For: Young Reason for Call: Talk to Nurse Summary of Call: Pt c/o losing voice this morning, PND x 2 to 3 weeks, chest congestion and tightness, pain under right shoulder blade, sweats. Denies fever. [phone] Initial call taken by: Zackery Barefoot CMA,  June 25, 2010 9:41 AM  Follow-up for Phone Call        Pt c/o PND with clear drainage for 2 weeks.  Now having chest heaviness and SOB and hoarseness.  Taking Mucinex and Albuterol nebs.  Also,  c/o pain under right shoulder blade.  Please advise. Abigail Miyamoto RN  June 25, 2010 10:40 AM   Additional Follow-up for Phone Call Additional follow up Details #1::        Per CDY- if more allergy type issues then suggest she use Loratadine 10mg  1 by mouth once daily as needed if more acute sickness going on then offer Zpak #1 take as directed no refills.Reynaldo Minium CMA  June 25, 2010 11:30 AM   pt wanted loratidine. rx sent. pt aware.Carron Curie CMA  June 25, 2010 11:41 AM     New/Updated Medications: LORATADINE 10 MG TABS (LORATADINE) Take 1 tablet by mouth once a day as needed Prescriptions: LORATADINE 10 MG TABS (LORATADINE) Take 1 tablet by mouth once a day as needed  #30 x 0   Entered by:   Carron Curie CMA   Authorized by:   Waymon Budge MD   Signed by:   Carron Curie CMA on 06/25/2010   Method used:   [facility]        [facility] [phone]* (retail)       [address]       [place], [place]  [address]       Ph: [phone] or [phone]       Fax: [phone]   RxID:   [id number]

## 2010-08-19 NOTE — Progress Notes (Signed)
Summary: cold / cough  Phone Note Call from Patient Call back at Home Phone [phone]   Caller: Patient Call For: [name] Summary of Call: pt c/o cough/ mostly clear mucus/ chest congestion x 3 days. no fever- no V or N. pt says she has been taking musinex and thera flu OTC. says the mucus is breaking up some, but pt wants some liquid musinex (pills are too big for her to swallow). pt also c/o feeling "drained- tired" with body aches. [facility] [address] [facility]  Initial call taken by: Tivis Ringer, CNA,  September 06, 2009 10:23 AM  Follow-up for Phone Call        Please advise. NKDA. Zackery Barefoot CMA  September 06, 2009 10:29 AM    Additional Follow-up for Phone Call Additional follow up Details #1::        1) Mucinex liquid is available otc 2) Body aches etc sound like flu. Suggest tylenol, fluids, rest. We can give Tamiflu if it has just started in last few days. Additional Follow-up by: Waymon Budge MD,  September 06, 2009 1:32 PM    Additional Follow-up for Phone Call Additional follow up Details #2::    called and spoke with pt and she is aware that mucinex is avaliable otc in liquid form----she will continue to rest and increase her fluids and tylenol as needed as directed by CY.  she will call back for appt if not getting better over the weekend. Randell Loop CMA  September 06, 2009 2:10 PM

## 2010-08-21 NOTE — Progress Notes (Signed)
Summary: cough not better  Phone Note Call from Patient Call back at Home Phone [phone]   Caller: Patient Call For: young Summary of Call: pt was seen 06/27/10. was told to call if cough syrup wasn't helping cough. it is not. [facility] [address] [address] call home # above until 5pm then cell [phone] Initial call taken by: Tivis Ringer, CNA,  July 01, 2010 4:28 PM  Follow-up for Phone Call        Spoke with pt.  She was here for appt with CDY on 06/27/10 and given pred taper and prometh/cod syrup.  She states that she has also started taking mucinex, and her cough is the same- no better or worse.  She is thinking she may need a stronger cough syrup.  Pls advise thanks! NKDA Follow-up by: Vernie Murders,  July 01, 2010 4:38 PM  Additional Follow-up for Phone Call Additional follow up Details #1::        Per CDY-okay to give Tussionex cough syrup #272ml  1 tsp two times a day as needed with 1 refill. Pt is aware this has been called in to pharmacy.Reynaldo Minium CMA  July 01, 2010 5:26 PM     New/Updated Medications: Sandria Senter ER 10-8 MG/5ML LQCR (HYDROCOD POLST-CHLORPHEN POLST) 1 tsp two times a day as needed Prescriptions: TUSSIONEX PENNKINETIC ER 10-8 MG/5ML LQCR (HYDROCOD POLST-CHLORPHEN POLST) 1 tsp two times a day as needed  #221ml x 1   Entered by:   Reynaldo Minium CMA   Authorized by:   Waymon Budge MD   Signed by:   Reynaldo Minium CMA on 07/01/2010   Method used:   Telephoned to ...       [facility]  [address] [place] [phone]* (retail)       [phone]       [place], [place]  [address]       Ph: [phone] or [phone]       Fax: [phone]   RxID:   [id number]

## 2010-08-21 NOTE — Assessment & Plan Note (Signed)
Summary: cough/cxr first//lmr   Primary Provider/Referring Provider:  Renaye Rakers  CC:  Acute visit-hemoptysis, SOB, wheezing, and pain under breasts..  History of Present Illness: 06/26/08-COPD, Hx Left LLresection , Considering lap band surgery- needs note. Noting easier DOE, not at rest and no increase in cough orwheeze. No chest pain, but some tightnes.Rarely has awakened with palpitiation.  Harder in hot weather. No known heart condition. No phlegm Daily Aleve for fibromyalgia. Daily Xanax for nerves. Had CX  05-19-2009- COPD, Hx LLL resection. 3 weeks cough, began with sore throat. Non purulent but has brought up some streaks of blood in clear mucus. Sharp pain through left breast with cough. Denies fever, chills, glands, GI upset. She had lap band surgery with 30 lb wt loss - Feb, 2010. Had flu vax. Proair and her neb do help. CXR 8/30//10- reviewed- post op changes on left, no mass, NAD  June 27, 2010- COPD, Hx LLL resection. Nurse-CC: Acute visit-hemoptysis,SOB,wheezing, pain under breasts. CXR- 06/27/10  NAD, NSC.  Acute visit. 2 weeks coughing hard. Began as chest congestion/ heavy. Mucinex seemed to bring it up. Initially clear. . Today coughed up light pink. Denies fever, sore throat, doesn't feel bad. Exposed to sick grandchildren. Using Proair and nebulizer several times each day with this.        Preventive Screening-Counseling & Management  Alcohol-Tobacco     Smoking Status: quit > 6 months     Packs/Day: 1.5     Year Quit: 2003  Current Medications (verified): 1)  Januvia 100 Mg Tabs (Sitagliptin Phosphate) .... Take 1 By Mouth Once Daily 2)  Centrum Silver  Tabs (Multiple Vitamins-Minerals) .... Take 1 By Mouth Once Daily 3)  Calcium 500 Mg Tabs (Calcium Carbonate) .... Take 1 By Mouth Two Times A Day 4)  Albuterol Sulfate (2.5 Mg/3ml) 0.083% Nebu (Albuterol Sulfate) .Marland Kitchen.. 1 Three Times A Day As Needed 5)  Proair Hfa 108 (90 Base) Mcg/act Aers  (Albuterol Sulfate) .... 2 Puffs Four Times A Day As Needed 6)  Aspirin 81 Mg  Tabs (Aspirin) .... Take 1 Tablet By Mouth Once A Day 7)  Aleve 220 Mg Tabs (Naproxen Sodium) .... Take 2 Tablet By Mouth Once A Day  Allergies (verified): No Known Drug Allergies  Past History:  Past Medical History: Last updated: 06/26/2008 Fibromyalgia COPD Diabetes, Type 2 Degenerative joint disease Hx bronchogenic adenocancer/ left lower lobectomy/ chemo 2003 Hx morbid obesity  Past Surgical History: Last updated: May 19, 2009 Total Abdominal Hysterectomy Tonsillectomy parathyroid cervical spine fusion heal spur carpal tunnel bilateral,  left lower lobectomy for adenoca 2003, chemo Lap band bariatric surgery Feb 2010  Family History: Last updated: 05-19-09 Father- died CVA Mother- living, hx CVA  Social History: Last updated: May 19, 2009 Patient states former smoker. 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  The patient denies shortness of breath at rest, non-productive cough, coughing up blood, chest pain, irregular heartbeats, acid heartburn, indigestion, loss of appetite, weight change, abdominal pain, difficulty swallowing, sore throat, tooth/dental problems, headaches, nasal congestion/difficulty breathing through nose, and sneezing.    Vital Signs:  Patient profile:   65 year old female Height:      61 inches Weight:  242.38 pounds BMI:     45.96 O2 Sat:      92 % on Room air Pulse rate:   109 / minute BP sitting:   118 / 72  (right arm) Cuff size:   large  Vitals Entered By: Reynaldo Minium CMA (June 27, 2010 2:06 PM)  O2 Flow:  Room air CC: Acute visit-hemoptysis,SOB,wheezing,pain under breasts.   Physical  Exam  Additional Exam:  General: A/Ox3; pleasant and cooperative, NAD ,morbidly obese SKIN: pink area right deltoid identified as area of pruritic rash, no hot, scaling or excoriated. NODES: no lymphadenopathy HEENT: Paul/AT, EOM- WNL, Conjuctivae- clear, PERRLA, TM-WNL, Nose- clear, Throat- clear and wnl, Mallampati III NECK: Supple w/ fair ROM, JVD- none, normal carotid impulses w/o bruits Thyroid- , old left cervical surgery scar CHEST: cough, wheeze HEART: RRR, no m/g/r heard ABDOMEN: Soft  EAV:WUJW, nl pulses, no edema  NEURO: Grossly intact to observation      CXR  Procedure date:  06/27/2010  Findings:      DG CHEST 2 VIEW - [id number]   Clinical Data: Cough, short of breath, former smoker, hemoptysis   CHEST - 2 VIEW   Comparison: Chest x-ray of 12/31/2009   Findings: The lungs are clear.  Mediastinal contours appear normal. The heart is within normal limits in size.  There are degenerative changes throughout the thoracic spine.   IMPRESSION: Stable chest x-ray.  No active lung disease.   Read By:  Juline Patch,  M.D.     Released By:  Juline Patch,  M.D.   Impression & Recommendations:  Problem # 1:  COPD (ICD-496) Acute bronchitic exacerbation of COPD. CXR doesn't show new problem. Blood tinge today after several days of hard cough likely reflects benign hemorrhagic bronchitis, but we will need to follow up. Hard cough was associated with some musculoskeletal rib pains. We discussed options and will give prednisone, accepting the impact on her glucose. I don't see a target yet for an anti biotic.   Problem # 2:  HEMOPTYSIS UNSPECIFIED (ICD-786.30) Discussed above. Has not reactivated significant bleed. Her updated medication list for this problem includes:    Centrum Silver Tabs (Multiple vitamins-minerals) .Marland Kitchen... Take 1 by mouth once daily    Calcium 500 Mg Tabs (Calcium carbonate) .Marland Kitchen... Take 1 by mouth two times a day    Albuterol Sulfate (2.5 Mg/25ml)  0.083% Nebu (Albuterol sulfate) .Marland Kitchen... 1 three times a day as needed    Proair Hfa 108 (90 Base) Mcg/act Aers (Albuterol sulfate) .Marland Kitchen... 2 puffs four times a day as needed    Prednisone 10 Mg Tabs (Prednisone) .Marland Kitchen... 1 tab four times daily x 2 days, 3 times daily x 2 days, 2 times daily x 2 days, 1 time daily x 2 days  Problem # 3:  MORBID OBESITY, HX OF (ICD-V13.8) There is a component of obesity hypoventilation and also simple deconditioning which limit exercise tolerance.   Medications Added to Medication List This Visit: 1)  Prednisone 10 Mg Tabs (Prednisone) .Marland Kitchen.. 1 tab four times daily x 2 days, 3 times daily x 2 days, 2 times daily x 2 days, 1 time daily x 2 days 2)  Promethazine Vc/codeine 6.25-5-10 Mg/14ml Syrp (Phenyleph-promethazine-cod) .Marland Kitchen.. 1 teaspoon four times a day as needed cough  Other Orders: Est. Patient Level IV (95621)  Patient Instructions: 1)  Please schedule a follow-up appointment in 6 months. 2)  CXR was done today and discussed. 3)  Script for cough syrup 4)  Script for prednisone 5)  If you notice more blood, persistent cough or anything that concerns you, pleaase let me know.  Prescriptions: PROMETHAZINE VC/CODEINE 6.25-5-10 MG/5ML SYRP (PHENYLEPH-PROMETHAZINE-COD) 1 teaspoon four times a day as needed cough  #200 ml x 0   Entered and Authorized by:   Waymon Budge MD   Signed by:   Waymon Budge MD on 06/27/2010   Method used:   Print then Give to Patient   RxID:   [id number] PREDNISONE 10 MG TABS (PREDNISONE) 1 tab four times daily x 2 days, 3 times daily x 2 days, 2 times daily x 2 days, 1 time daily x 2 days  #20 x 0   Entered and Authorized by:   Waymon Budge MD   Signed by:   Waymon Budge MD on 06/27/2010   Method used:   Print then Give to Patient   RxID:   [id number]      CXR  Procedure date:  06/27/2010  Findings:      DG CHEST 2 VIEW - [id number]   Clinical Data: Cough, short of breath, former smoker, hemoptysis    CHEST - 2 VIEW   Comparison: Chest x-ray of 12/31/2009   Findings: The lungs are clear.  Mediastinal contours appear normal. The heart is within normal limits in size.  There are degenerative changes throughout the thoracic spine.   IMPRESSION: Stable chest x-ray.  No active lung disease.   Read By:  Juline Patch,  M.D.     Released By:  Juline Patch,  M.D.

## 2010-09-10 ENCOUNTER — Telehealth (INDEPENDENT_AMBULATORY_CARE_PROVIDER_SITE_OTHER): Payer: Self-pay | Admitting: *Deleted

## 2010-09-16 NOTE — Progress Notes (Signed)
Summary: albuterol and walker rx<<<done  Phone Note Call from Patient Call back at Home Phone [phone]   Caller: Patient Call For: young Summary of Call: pt is out of albuterol has some that she believes has expired. [facility] [address]  Initial call taken by: Tivis Ringer, CNA,  September 10, 2010 11:06 AM  Follow-up for Phone Call        Called, spoke with pt.  States she has albuterol nebs but they expired in Dec 2011.  Requesting new albuterol rx to be sent to [facility].  Rx sent -- pt aware.  Also, pt states it was recommneded that she use a walker to assist with walking when she was in pulmonary rehab.  States medicare will cover a walker with rx.  Dr. Maple Hudson, pls advise if you are ok with writing rx for walker.  Thanks!  **If walker rx ok with CDY, pt will pick it up. Follow-up by: Gweneth Dimitri RN,  September 10, 2010 12:07 PM  Additional Follow-up for Phone Call Additional follow up Details #1::        Per CDY-okay to order walker.Reynaldo Minium CMA  September 10, 2010 2:20 PM   Rx printed and placed on CY's cart for signature. Gweneth Dimitri RN  September 10, 2010 2:23 PM     Additional Follow-up for Phone Call Additional follow up Details #2::    Rx for walker at front for pick up.Reynaldo Minium CMA  September 11, 2010 10:19 AM   Pt is aware RX is ready for pick up.Michel Bickers CMA  September 11, 2010 10:32 AM  New/Updated Medications: Dan Humphreys use as directed dx code - 496 Prescriptions: WALKER use as directed dx code - 496  #1 x 0   Entered by:   Gweneth Dimitri RN   Authorized by:   Waymon Budge MD   Signed by:   Gweneth Dimitri RN on 09/10/2010   Method used:   Print then Give to Patient   RxID:   [id number] ALBUTEROL SULFATE (2.5 MG/3ML) 0.083% NEBU (ALBUTEROL SULFATE) 1 three times a day as needed  #100 x prn   Entered by:   Gweneth Dimitri RN   Authorized by:   Waymon Budge MD   Signed by:   Gweneth Dimitri RN on 09/10/2010   Method  used:   [facility]        [facility] [phone][facility] (retail)       [address]       [place], [place]  [address]       Ph: [phone] or [phone]       Fax: [phone]   RxID:   [id number]

## 2010-10-02 ENCOUNTER — Other Ambulatory Visit (HOSPITAL_COMMUNITY): Payer: Self-pay | Admitting: Obstetrics and Gynecology

## 2010-10-02 DIAGNOSIS — Z1231 Encounter for screening mammogram for malignant neoplasm of breast: Secondary | ICD-10-CM

## 2010-10-03 LAB — GLUCOSE, CAPILLARY: Glucose-Capillary: 117 mg/dL — ABNORMAL HIGH (ref 70–99)

## 2010-10-06 LAB — GLUCOSE, CAPILLARY: Glucose-Capillary: 92 mg/dL (ref 70–99)

## 2010-10-07 LAB — GLUCOSE, CAPILLARY
Glucose-Capillary: 111 mg/dL — ABNORMAL HIGH (ref 70–99)
Glucose-Capillary: 71 mg/dL (ref 70–99)
Glucose-Capillary: 86 mg/dL (ref 70–99)
Glucose-Capillary: 97 mg/dL (ref 70–99)

## 2010-10-09 ENCOUNTER — Other Ambulatory Visit: Payer: Self-pay | Admitting: Family Medicine

## 2010-10-09 DIAGNOSIS — R609 Edema, unspecified: Secondary | ICD-10-CM

## 2010-10-09 DIAGNOSIS — R52 Pain, unspecified: Secondary | ICD-10-CM

## 2010-10-10 ENCOUNTER — Ambulatory Visit
Admission: RE | Admit: 2010-10-10 | Discharge: 2010-10-10 | Disposition: A | Discharge status: Home / Self Care | Payer: Medicare Other | Source: Ambulatory Visit | Attending: Family Medicine | Admitting: Family Medicine

## 2010-10-10 DIAGNOSIS — R609 Edema, unspecified: Secondary | ICD-10-CM

## 2010-10-10 DIAGNOSIS — R52 Pain, unspecified: Secondary | ICD-10-CM

## 2010-10-14 ENCOUNTER — Other Ambulatory Visit: Payer: Self-pay | Admitting: Interventional Radiology

## 2010-10-14 DIAGNOSIS — I83819 Varicose veins of unspecified lower extremities with pain: Secondary | ICD-10-CM

## 2010-10-21 ENCOUNTER — Other Ambulatory Visit: Payer: Medicare Other

## 2010-11-04 LAB — CBC
Hemoglobin: 13.2 g/dL (ref 12.0–15.0)
MCHC: 32.4 g/dL (ref 30.0–36.0)
MCHC: 32.2 g/dL (ref 30.0–36.0)
MCV: 83.2 fL (ref 78.0–100.0)
MCV: 83.1 fL (ref 78.0–100.0)
Platelets: 186 10*3/uL (ref 150–400)
RBC: 4.92 MIL/uL (ref 3.87–5.11)
RDW: 16.5 % — ABNORMAL HIGH (ref 11.5–15.5)

## 2010-11-04 LAB — DIFFERENTIAL
Basophils Relative: 1 % (ref 0–1)
Eosinophils Absolute: 0 10*3/uL (ref 0.0–0.7)
Eosinophils Relative: 4 % (ref 0–5)
Lymphocytes Relative: 25 % (ref 12–46)
Lymphs Abs: 1.4 10*3/uL (ref 0.7–4.0)
Monocytes Absolute: 0.8 10*3/uL (ref 0.1–1.0)
Monocytes Relative: 13 % — ABNORMAL HIGH (ref 3–12)
Monocytes Relative: 10 % (ref 3–12)

## 2010-11-04 LAB — COMPREHENSIVE METABOLIC PANEL
AST: 18 U/L (ref 0–37)
Albumin: 3.6 g/dL (ref 3.5–5.2)
CO2: 29 mEq/L (ref 19–32)
Calcium: 8.9 mg/dL (ref 8.4–10.5)
Creatinine, Ser: 0.94 mg/dL (ref 0.4–1.2)
GFR calc Af Amer: >60 mL/min (ref >60)
GFR calc non Af Amer: >60 mL/min (ref >60)
Total Protein: 6.4 g/dL (ref 6.0–8.3)

## 2010-11-04 LAB — GLUCOSE, CAPILLARY
Glucose-Capillary: 147 mg/dL — ABNORMAL HIGH (ref 70–99)
Glucose-Capillary: 185 mg/dL — ABNORMAL HIGH (ref 70–99)
Glucose-Capillary: 205 mg/dL — ABNORMAL HIGH (ref 70–99)

## 2010-12-02 NOTE — Op Note (Signed)
NAMEGABRYEL, TALAMO         ACCOUNT NO.:  [id number]   MEDICAL RECORD NO.:  [id number]          PATIENT TYPE:  AMB   LOCATION:  DAY                          FACILITY:  [facility]   PHYSICIAN:  Sandria Bales. Ezzard Standing, M.D.  DATE OF BIRTH:  May 22, 1946   DATE OF PROCEDURE:  09/04/2008  DATE OF DISCHARGE:                               OPERATIVE REPORT   Date of surgery ?   PREOPERATIVE DIAGNOSES:  1. Morbid obesity (weight of 257, BMI of 48.5).  2. Umbilical hernia.   POSTOPERATIVE DIAGNOSES:  1. Morbid obesity (weight of 257, BMI of 48.5).  2. Umbilical hernia with incarcerated omentum.   PROCEDURE:  Lap-Band, AP standard, subcutaneous port placed with mesh  backing.   SURGEON:  Sandria Bales. Ezzard Standing, M.D.   FIRST ASSISTANT:  Sharlet Salina T. Hoxworth, M.D.   ANESTHESIA:  General endotracheal.   ESTIMATED BLOOD LOSS:  Minimal.   INDICATIONS FOR PROCEDURE:  Ms. [name] is a 65 year old black  female, a patient of Renaye Rakers, M.D. who has been through our preop  bariatric program and is interested a Lap-Band placement.   The indications, potential complication of  Lap-Band placement were  explained to the patient.  The potential complications include, but are  not limited to bleeding, infection, open surgery and long-term erosion  or slippage of the band.   OPERATIVE NOTE:  The patient placed was placed in a supine position,  given a general endotracheal  anesthetic.  Her abdomen was prepped with  CHG and sterilely draped.  She was given a gram of Ancef.  She had PAS  stockings in place.  A time-out was held identifying the patient and the  procedure.   I accessed her abdominal cavity in the left upper quadrant using a 12 mm  OptiView Ethicon trocar.  I put it in the abdominal cavity.  She did  have a fair amount of adhesions.  She does have an umbilical hernia with  incarcerated omentum.  She has other scar tissue lower towards the her  pelvis and a little bit on the left side.   I then placed five additional  trocars, a 5 mm subxiphoid port for the liver retractor, a 15 mm right  subcostal port,  a 11 mm right paramedian port, a 11 mm left paramedian  port and a 5 mm left lateral port.   She had a fairly large liver which was somewhat difficult to retract out  of the way, but I put the [facility] liver retractor under the liver.   We passed the sizing tube and blew it up with 15 mL of air and pulled  this back.  There was no evidence of a hiatal hernia at least by this  functional study and the patient has no reflux, so I had no plans for  hiatal hernia repair.   I opened a window along the angle of His.  The left side of the  gastroesophageal junction and then went along and opened the  gastrohepatic ligament well on the inside the right crus.  She actually  had a lot of fat covering her crus, probably more than the  average  patient, but her upper fat otherwise was maybe average, so I was going  to place an AP standard band.  I placed a finger dissector behind her  gastroesophageal junction, passed the tubing around the proximal stomach  and then cinched it down over the sizing tube.  Again, this was an AP  standard band.   I then imbricated the stomach laterally over the band.  I put three  sutures of 2-0 Ethibond suture and held these with a tie knot.  After  completed the band laid good and flat.  The sutures in the outer  curvature of the stomach looked well.  There was no bleeding.  We did  bruise or beat up the undersurface of the liver which I really had  trouble getting out of the way.   The liver retractor was then removed.  There was no bleeding and the  trocars were removed in turn.  The band tubing brought out the right  paramedian port.  Because of the depth of her abdominal wall I did place  a subcutaneous port.  I sewed a piece of polypropylene mesh to the back  of the port and placed the port immediately lateral to her right  paramedian  incision.  I covered this over with 2-0 Vicryl sutures.  The  skin site was closed with 5-0 Vicryl suture.  The skin site was painted  with tincture of Benzoin, Steri-Strips.   The patient tolerated the procedure well and was transported to the  recovery room in good condition.  Sponge and needle count were correct  at the end of the case.      Sandria Bales. Ezzard Standing, M.D.  Electronically Signed     DHN/MEDQ  D:  09/04/2008  T:  09/04/2008  Job:  [id number]   cc:   Veverly Fells. Ophelia Charter, M.D.  Fax: [phone]   Clinton D. Maple Hudson, MD, FCCP, FACP  [facility]-Pulmonary [facility]  [address]  [place]  [place] [address]   Rose Phi. Myna Hidalgo, M.D.  Fax: [phone]   Hal Morales, M.D.  Fax: [phone]   Renaye Rakers, M.D.  Fax: [phone]

## 2010-12-05 NOTE — Assessment & Plan Note (Signed)
[facility]                               PULMONARY OFFICE NOTE   [name], [name]                       MRN:          [id number]  DATE:05/31/2006                            DOB:          [birth year]    HISTORY OF PRESENT ILLNESS:  The patient is a female patient of Dr. Roxy Cedar  who presents for an acute office visit.  Patient complains of a 1 week  history of increased productive cough with thick, yellow sputum, nasal  congestion that has been bloody at times and wheezing.  Patient denies any  chest pain, orthopnea, PND or leg swelling.   PAST MEDICAL HISTORY:  Is reviewed.   CURRENT MEDICATIONS:  Is reviewed.   EXAMINATION:  Patient is a pleasant female in no acute distress.  She is afebrile  with stable vital signs.  O2 saturation is 92% on room air.  HEENT:  Nasal passages are erythematous.  Oropharynx is clear.  NECK:  Supple with no adenopathy.  LUNGS:  Sounds reveal coarse breath sounds with a few expiratory wheezes.  CARDIAC:  Regular rate and rhythm.  ABDOMEN:  Soft.  EXTREMITIES:  No edema.   IMPRESSION AND PLAN:  Acute exacerbation of asthmatic bronchitis.  Patient  is to begin Avelox times 7 days.  Mucinex DM twice a day.  Prednisone taper  over the next week.  Xopenex nebulizer treatment was given in the office  today.  Patient is to return with Dr. Maple Hudson back in 2 weeks or sooner if  needed.      Rubye Oaks, NP  Electronically Signed      Clinton D. Maple Hudson, MD, Tonny Bollman, FACP  Electronically Signed   TP/MedQ  DD: 05/31/2006  DT: 05/31/2006  Job #: [id number]

## 2010-12-05 NOTE — Op Note (Signed)
[place]  Patient:    [name]                        MRN: [id number] Proc. Date: 02/18/00 Adm. Date:  [id number] Disc. Date: [id number] Attending:  Sonda Primes CC:         [name] [name], M.D. 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  PROCEDURE:  Parathyroid exploration and excision of left lower parathyroid gland.  SURGEON:  Dr. [name].  ASSISTANT:  [name]. [name], M.D.  ANESTHESIA:  General.  INDICATIONS FOR PROCEDURE:  The patient is a 65 year old woman presenting with asymptomatic hypercalcemia with PTH levels approximately 270.  Sestamibi scan showing a increased uptake on systems of left lower parathyroid adenoma.  The patient is brought to the operating room now for neck exploration and parathyroidectomy.  DESCRIPTION OF PROCEDURE:  Following the induction of satisfactory general anesthesia with the patient positioned supinely, the anterior neck and chest were prepped and draped to be included in the sterile operative field.  It should be noted that the patient had a previous anterior cervical fusion.  I used the portion of the same incision and made a collar incision approximately two fingerbreadths above the sternal notch.  This was down the subcutaneous tissue and across the platysmal muscle.  Flaps were raised superiorly and inferiorly down to the strap muscles, and the strap muscles were opened in the midline and dissection carried down upon the thyroid.  I used a probe to identify the region of increased uptake in the left lower pole area, and on palpation of this area, there was, in fact, a large parathyroid gland there. Dissection was carried down around the parathyroid gland, carefully dissecting the surrounding soft tissues and  clamping its blood supply with clips. Parathyroid was removed for pathologic evaluation.  Frozen section diagnosis showed parathyroid tissue consistent with an adenoma.  The neck was further explored by exploring the right lower side where a normal parathyroid gland was encountered.  No further exploration was carried out.  All areas of dissection checked for hemostasis and noted to be dry.  Sponge, instrument, and sharp counts verified.  Strap muscles closed in the midline with a running suture of 2-0 Vicryl.  Platysma muscle was reapproximated with interrupted 3-0 Vicryl sutures and the skin closed with running 5-0 Monocryl.  The wound was then reinforced with Steri-Strips and sterile dressings applied.  Anesthetic reversed.  The patient was removed from the operating room to the recovery room in stable condition, having tolerated the procedure well. DD:  02/18/00 TD:  02/19/00 Job: [id number]

## 2010-12-05 NOTE — Discharge Summary (Signed)
[name]. [facility]  Patient:    [name], [name] Visit Number: [id number] MRN: [id number]          Service Type: MED Location: 2000 2018 01 Attending Physician:  Cameron Proud Dictated by:   Maxwell [place], RNFA Admit Date:  01/20/2002 Discharge Date: 02/02/2002   CC:         [name], M.D.  Leilani Able, M.D.  Rose Phi. Myna Hidalgo, M.D.  Charlcie Cradle Delford Field, M.D. [facility]  CVTS Office   Discharge Summary  DATE OF BIRTH: 05-02-1946  ADMISSION DIAGNOSIS: Left lung mass.  PAST MEDICAL HISTORY:  History of phlebitis, superficial.  PAST SURGICAL HISTORY: 1. Cervical spine surgery. 2. Hysterectomy. 3. Parathyroid tumor excision.  ALLERGIES: PENICILLIN. She recalls a rash with this. No bronchospasm or swelling and she has taken cephalosporins without any difficulty.  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  HISTORY OF PRESENT ILLNESS: [name] is a 65 year old African-American female who presented with a one month history of intensifying left sided chest pain, mild shortness of breath and profuse sweating. She sought medical attention at the [facility] emergency room because she thought she was having a heart attack.  HOSPITAL COURSE: On January 21, 2002 the emergency room evaluation included an EKG which returned as normal, a chest x-ray which revealed a 6 cm left mid lung lesion. A CT scan done the same day confirmed a 5.9 cm mass on the left lower lobe of her lung. This was thought to be consistent with carcinoma, also showed mild lymphadenopathy in the hilar area and the AP window. Pulmonology consult was obtained with Dr. Shan Levans. After examination of the patient and review of available films, he recommended fine needle biopsy to be done by radiologist for diagnoses as well as PFTs in anticipation of probable need for pulmonary surgery and CVTS consult. On January 23, 2002 fine needle biopsy was completed by the radiologist without complications. On January 24, 2002 preliminary pathology was returned on this biopsy as small call carcinoma. PFTs done the same day revealed mild obstructive disease. Also on January 24, 2002 [name] was evaluated by Dr. Edwyna Shell. After exam of the patient and review of all the available records, Dr. Edwyna Shell recommended surgical intervention for final diagnosis. The procedure and benefits were discussed with [name] and her family and she agreed to proceed.  SURGICAL PROCEDURE: On January 26, 2002 [name] underwent an uncomplicated left VATS, left lower lobectomy, and node sampling and insertion of ON-Q catheter for postoperative pain control with Marcaine. She tolerated this procedure well and was transferred in stable condition to the PACU. [name] has remained hemodynamically stable since surgery and her postoperative course has been uneventful. Lovenox was initiated on postoperative day one for her history of phlebitis. On January 26, 2002 final pathology, [facility] #[id number], revealed moderately differentiated squamous cell carcinoma, invading but not through the visceral pleura. Bronchial margins are free of disease and lymph nodes were all negative for metastatic disease. On January 27, 2002 oncology consult was obtained with Dr. Arlan Organ. He classified this cancer as a stage B (T2-N0). He thinks that she is a possible candidate for his current protocol. He further recommended repeat staging with CT scans of the brain, chest, abdomen, and pelvis as well as a bone scan. He plans to follow her as an inpatient and an outpatient. On January 30, 2002 CT scans of the brain, chest, abdomen,  and pelvis were performed. They were all essentially negative for metastatic disease. Bone scan was also performed and the results are unavailable at this time. On January 31, 2002 [name] reports feeling well except for incisional soreness.  Her lung sounds are clearing. She is requiring very little supplemental O2. She has felt oral candidiasis which is being treated with magic mouthwash and Diflucan. She is tolerating a regular diet, no nausea. She is voiding without any difficulty. Her incisions are healing well. She is ambulating three to four times a day with minimal assistance and her pain control was adequate. It is anticipated that she will be ready for discharge home tomorrow, February 01, 2002.  DISCHARGE CONDITION: Improved.  ACTIVITY: She has been asked to refrain from any driving, heavy lifting, pushing, or pulling. She has also been instructed to continue her breathing exercises and daily walking.  DIET: Low fat, low salt.  WOUND CARE: She may shower with mild soap and water. She has been asked to examine her incisions daily and if they become red, hot, swollen, draining, or if she has a fever greater than 101 degrees, she has been asked to call Dr. Remo Lipps office.  DISCHARGE MEDICATIONS: 1. Tylox one to two p.o. q.4-6h. p.r.n. pain. 2. Nicotine patch 14 mg. She is to change the patch daily. Continue this    dosage for four more weeks and decrease the dosage to 7 mg patch for four    more weeks. She has also been reminded to refrain from smoking,    particularly while using this patch. 3. Enteric coated aspirin 325 mg p.o. q.d. 4. Allegra p.r.n.  FOLLOW-UP: 1. She has an appointment to see Dr. Edwyna Shell at the CVTS Office on Thursday,    February 09, 2002 at 2:10 p.m. She has been asked to get a chest x-ray at 1:45    p.m. that day and bring the chest x-ray to Dr. Remo Lipps office. 2. She will be followed up as an outpatient by Dr. Myna Hidalgo. Arrangements     will be made prior to discharge. Dictated by:   Maxwell [place], RNFA Attending Physician:  Cameron Proud DD:  01/31/02 TD:  02/02/02 Job: [id number]

## 2010-12-05 NOTE — Assessment & Plan Note (Signed)
[facility]                             PULMONARY OFFICE NOTE   GLORENE, LEITZKE                       MRN:          [id number]  DATE:07/21/2006                            DOB:          1946-01-16    PROBLEM:  1. Chronic obstructive pulmonary disease.  2. History of left lower lobectomy for bronchogenic cancer, 2003.  3. Degenerative joint disease.  4. Diabetes.   HISTORY:  Had flu vaccine and believes she is up-to-date on pneumococcal  vaccine.  Three or four days ago began generalized aching, mild nausea,  anorexia, chest congestion, and temperatures in the range of 101.  Sputum is brown and gray.  She feels that her ears are draining.   MEDICATION:  1. Alprazolam 1/2 x1 mg t.i.d.  2. Aspirin 325 mg.  3. Aleve 220 mg.  4. Allegra 180 mg.  5. Januvia 100 mg.  6. Quinine 325 mg at h.s.  7. Albuterol inhaler.  8. Albuterol by nebulizer for Combivent inhaler.   DRUG INTOLERANCE:  DRUG INTOLERANT TO PENICILLIN WITH RASH.   OBJECTIVE:  Weight 246 pounds, BP 138/82, pulse regular 100, room air  saturation 91%.  Red throat, mild, diffuse chest congestion.  Work of breathing is not  labored.  There is no stridor.  I do not find adenopathy.  Tympanic  membranes look clear.  HEART:  Sounds are regular.   A CT scan of the chest a year ago had shown stable emphysema and  postoperative changes from prior left lower lobectomy.   IMPRESSION:  1. Viral syndrome suggestive of influenza.  2. Chronic obstructive pulmonary disease.  3. History of lower lobectomy for cancer.   PLAN:  She is given an inhalation treatment today with Xopenex 1.25 mg,  Depo-Medrol 80 mg IM.  Tamiflu 75 mg b.i.d. for 5 days, albuterol Rescue  inhaler 2 puffs q.i.d. p.r.n., Tussionex,  because of her interest in trying it, 150 ml at 5 ml q.12 h. p.r.n.  Schedule return in 6 weeks anticipating that we are going to need a  chest x-ray and pulmonary function test.     Clinton D. Maple Hudson, MD, Tonny Bollman, FACP  Electronically Signed    CDY/MedQ  DD: 07/21/2006  DT: 07/21/2006  Job #: [id number]   cc:   Rose Phi. Myna Hidalgo, M.D.  Betti D. Pecola Leisure, M.D.

## 2010-12-05 NOTE — Op Note (Signed)
[place]  Patient:    [name], [name] Visit Number: [id number] MRN: [id number]          Service Type: MED Location: [phone] Attending Physician:  Beverely Low Dictated by:   D. Karle Plumber, M.D. Admit Date:  01/20/2002   CC:         Charlcie Cradle. Delford Field, M.D. [facility]  [name], M.D.   Operative Report  PREOPERATIVE DIAGNOSIS:  Squamous cell cancer, left lower lobe.  POSTOPERATIVE DIAGNOSIS:  Squamous cell cancer, left lower lobe.  OPERATION PERFORMED:  Left video-assisted thoracic surgery, left thoracotomy, left lower lobectomy.  SURGEON:  D. Karle Plumber, M.D.  FIRST ASSISTANT:  Maxwell [place], RNFA  ANESTHESIA:  General anesthesia.  INDICATION:  This patient was found to have a left lower lobe mass, with a history of smoking.  A needle biopsy revealed squamous cell cancer.  There was no hilar or mediastinal adenopathy.  DESCRIPTION OF PROCEDURE:  She was brought to the operating room, underwent general anesthesia and was turned to the left lateral thoracotomy position. She was prepped and draped in the usual sterile manner.  A dual-lumen tube was inserted.  Two trocar sites were made in the anterior and posterior axillary lines at the 7th intercostal space.  Two trocars were inserted.  A 30 degree scope was inserted and the lesion was seen in the superior segment of the left lower lobe; it was not attached to the chest wall.  Posterolateral thoracotomy was made over the 5th intercostal space, with the latissimus being divided and serratus reflected anterior.  A portion of the 6th rib was taken posteriorly at the angle subperiosteally, then a Finochietto retractor was inserted and a Tuffier was placed at right angles.  Inferior pulmonary ligament was taken down with electrocautery.  The posterior mediastinum was dissected out.  There were a marked amount of what appeared to be 1- to 2-cm nodes that appeared to be  more plain than with cancer, but multiple 11-L, 10-L and 9-L nodes were dissected out.  The inferior pulmonary vein was dissected out and looped with a vascular tape.  Dissection was then carried up to the pulmonary artery and the major portion of the fissure was dissected out and divided with an EZ-45; this exposed the superior pulmonary vein and two nodes were dissected out around the vein -- they were 11-L nodes -- and then the vein was divided with an Autosuture 30 stapler.  Dissection was carried inferiorly, dividing the inferior portion of the fissure with the EZ-45, and then dissecting out some 10-L and 11-L nodes.  The basilar branches of the pulmonary artery were looped with a vascular tape and then stapled and divided with the Autosuture vascular stapler.  Finally, several more nodes were dissected around the bronchus.  The inferior pulmonary vein was then stapled and divided with Autosuture stapler. The bronchus was divided with an EZ-45; the left lower lobe was removed.  Two chest tubes were brought through the trocar sites and tied in place. FocalSeal was applied to the staple line using first the primer, then the sealant, and then the ultraviolet wand for ______.  A Marcaine block was done in the usual fashion and the chest was closed with #2 chromics and then the chest was then closed with interrupted #1 Dexon in the muscle layer, 2-0 Dexon in the subcutaneous tissue.  Prior to closing though, On-Q catheters were inserted separately through sheaths and secured underneath the pericostals  for Marcaine infusion.  The patient tolerated the procedure well and was returned to the recovery room in stable condition. Dictated by:   D. Karle Plumber, M.D. Attending Physician:  Beverely Low DD:  01/26/02 TD:  01/29/02 Job: [id number]

## 2010-12-05 NOTE — Op Note (Signed)
   NAMETEQUILA, ROTTMANN                          ACCOUNT NO.:  [id number]   MEDICAL RECORD NO.:  [id number]                   PATIENT TYPE:  OIB   LOCATION:  2854                                 FACILITY:  [facility]   PHYSICIAN:  Ines Bloomer, M.D.              DATE OF BIRTH:  07-20-1946   DATE OF PROCEDURE:  03/24/2002  DATE OF DISCHARGE:  03/24/2002                                 OPERATIVE REPORT   PREOPERATIVE DIAGNOSIS:  Status post left lower lobectomy for non-small cell  lung cancer.   POSTOPERATIVE DIAGNOSIS:  Status post left lower lobectomy for non-small  cell lung cancer.   OPERATION:  Insertion of left subclavian Port-A-Cath.   SURGEON:  Ines Bloomer, M.D.   ANESTHESIA:  General, 1% Xylocaine anesthesia and IV sedation.   DESCRIPTION OF PROCEDURE:  After prepping and draping the left chest, area  was infiltrated with 1% Xylocaine.  A left subclavian puncture was  performed.  A guide wire was threaded to the right atrium.  Then another  area was infiltrated with 1% Xylocaine.  Below this, a transverse incision  was made, and a pocket for the reservoir was dissected out.  The Port-A-Cath  was placed in the pocket and tunneled up to the stab wound around the guide  wire.  The tubing was measured to full, and cut __________; over the guide  wire was passed the dilator with peel-away sheath.  The dilator and guide  wire were removed, and through the peel away sheath was passed the tubing to  be in the right atrium by fluoro.  The peel away sheath was removed.  The  Port-A-Cath was flushed with heparin.  Wounds were closed with 3-0 Vicryl on  subcutaneous tissue and Dermabond to the skin.  The patient was returned to  the recovery room in stable condition.     Ines Bloomer, M.D.                    Ines Bloomer, M.D.    DPB/MEDQ  D:  03/24/2002  T:  03/25/2002  Job:  [id number]

## 2010-12-05 NOTE — Consult Note (Signed)
[place]. [facility]  Patient:    [name], [name] Visit Number: [id number] MRN: [id number]          Service Type: MED Location: [phone] Attending Physician:  Beverely Low Dictated by:   Charlcie Cradle Delford Field, M.D. [facility] Proc. Date: 01/21/02 Admit Date:  01/20/2002   CC:         [name], M.D.  Leilani Able, M.D.   Consultation Report  CHIEF COMPLAINT:  Lung mass and chest pain.  HISTORY OF PRESENT ILLNESS:  A 65 year old African-American female, history of left lung mass that has developed in the face of increasing left-sided pleuritic chest pain over the past 30 days, mild increased shortness of breath, and diffuse profuse night sweats and daytime sweating.  The patient was found to have on CT scan of the chest a large left lung mass with small amounts of lymph nodes in the hilar region and also the aortopulmonary window.  The patient is referred for further evaluation.  The patient has had streaky hemoptysis with otherwise a dry cough.  Denies any severe shortness of breath, has had no change in weight or appetite or leg pain or swelling.  PAST MEDICAL HISTORY:  No history of diabetes or hypertension.  Does have a history of phlebitis in the past.  ALLERGIES:  PENICILLIN.  SOCIAL HISTORY:  She smokes three-fourths pack a day for 37 years.  Is unemployed.  Previously worked at [employer].  PAST SURGICAL HISTORY:  Cervical spine surgery, hysterectomy, parathyroid tumor removed.  MEDICATIONS:  Medications prior to admission:  Allegra and aspirin p.r.n.  FAMILY HISTORY:  Noncontributory.  PHYSICAL EXAMINATION:  VITAL SIGNS:  Temperature 98, pulse 57, respirations 20, blood pressure 100/65, saturation 95% on room air.  HEENT/NECK:  No jugular venous distention or lymphadenopathy.  Oropharynx clear.  Neck supple.  CHEST:  Clear bilaterally to auscultation and percussion.  There is no evidence of wheeze or rhonchi.  CARDIAC:   Regular rate and rhythm without S3, normal S1, S2.  ABDOMEN:  Soft, nontender, bowel sounds active.  EXTREMITIES:  No edema or clubbing, no venous disease.  NEUROLOGIC:  Intact.  LABORATORY DATA:  CT scan of the chest showed a large mass seen in the superior segment of the left lower lobe measuring 5.9 x 5 cm in diameter that is continuous with the left hilum.  There are small amounts of left hilar adenopathy.  There is also a small 1 cm lymph node seen in the aortopulmonary window.  No other masses or adenopathy seen.  The liver is clear.  No evidence of pleural disease.  There is mild emphysema seen in both upper lobes.  White count 6.0, hemoglobin 14.9, and platelet count 214.  Sodium 141, potassium 4.1, chloride 109, CO2 28, blood sugar 113, BUN 15, creatinine 1.1, calcium 9.0.  CK-MB 3.5, troponin I was 0.02.  IMPRESSION: 1. Large left lung mass with contiguous involvement of the left hilum and left    chest wall with secondary pleuritic chest pain secondary to chest wall    involvement and small hilar lymph nodes. This may be a stage III-A.  The    aortopulmonary window nodule is somewhat concerning and may make this a    stage III-B lung cancer. 2. Early emphysema. 3. Active smoking. 4. Doubt pulmonary embolism or myocardial infarction or cardiac event.  RECOMMENDATIONS: 1. Obtain needle biopsy of the left lung mass per radiology. 2. Obtain full set of pulmonary function studies.  3. Obtain a CVTS consult on January 23, 2002, to evaluate the potential    resectability of this lesion. Dictated by:   Charlcie Cradle Delford Field, M.D. [facility] Attending Physician:  Beverely Low DD:  01/21/02 TD:  01/24/02 Job: [id number]

## 2010-12-05 NOTE — Procedures (Signed)
[place]. [facility]  Patient:    [name], [name] Visit Number: [id number] MRN: [id number]          Service Type: MED Location: [phone] Attending Physician:  Beverely Low Dictated by:   Guadalupe Maple, M.D. Proc. Date: 01/26/02 Admit Date:  01/20/2002   CC:         Guadalupe Maple, M.D.  Patients chart   Procedure Report  PROCEDURE:  Lumbar epidural catheter placement for postoperative pain control.  INDICATION:  The patient is a 65 year old African-American female with a history of a carcinoma involving the left upper lobe of the lung.  She underwent a left thoracotomy and left upper lobectomy by Dr. Norton Blizzard under general anesthesia.  Dr. Norton Blizzard requested that I insert an epidural catheter for postoperative pain control.  Both he and I agreed that this was indicated in this patient due to the painful nature of the surgery.  DESCRIPTION OF PROCEDURE:  Upon completion of the procedure, while the patient was still in the right lower decubitus position, the lower thoracic and upper lumbar area was prepped and draped sterilely.  The T12 and L1 interspace was entered in the midline using an 18 gauge Tuohy needle.  One pass of the needle was required to enter the epidural space using a loss of resistance technique with air.  The aspiration of the needle was negative for blood or CSF.  Then 10 cc of preservative-free containing 50 mg of 1% lidocaine was injected through the needle without difficulty.  A catheter was then threaded 4 cm into the epidural space.  The needle was removed without difficulty and the catheter was taped securely in place.  The patient tolerated the procedure well without apparent complications.  She was then turned to the supine position and extubated. When she comes to the recovery room, she will be started an epidural fentanyl infusion and followed on a daily by the anesthesia service.  She  tolerated the procedure well. Dictated by:   Guadalupe Maple, M.D. Attending Physician:  Beverely Low DD:  01/26/02 TD:  01/29/02 Job: [id number]

## 2010-12-08 ENCOUNTER — Ambulatory Visit (HOSPITAL_COMMUNITY)
Admission: RE | Admit: 2010-12-08 | Discharge: 2010-12-08 | Disposition: A | Discharge status: Home / Self Care | Payer: Medicare Other | Source: Ambulatory Visit | Attending: Obstetrics and Gynecology | Admitting: Obstetrics and Gynecology

## 2010-12-08 ENCOUNTER — Ambulatory Visit (HOSPITAL_COMMUNITY): Payer: BC Managed Care – PPO

## 2010-12-08 DIAGNOSIS — Z1231 Encounter for screening mammogram for malignant neoplasm of breast: Secondary | ICD-10-CM | POA: Insufficient documentation

## 2010-12-22 ENCOUNTER — Telehealth: Payer: Self-pay | Admitting: Internal Medicine

## 2010-12-22 NOTE — Telephone Encounter (Signed)
[id number]

## 2010-12-23 MED ORDER — AZITHROMYCIN 250 MG PO TABS: ORAL_TABLET | ORAL | Status: AC

## 2010-12-23 MED ORDER — PREDNISONE 10 MG PO TABS: ORAL_TABLET | ORAL | Status: DC

## 2010-12-23 MED ORDER — HYDROCODONE-HOMATROPINE 5-1.5 MG/5ML PO SYRP: 5.0000 mL | ORAL_SOLUTION | Freq: Four times a day (QID) | ORAL | Status: AC | PRN

## 2010-12-23 NOTE — Telephone Encounter (Signed)
Spoke with pt and notified that rxs for pred taper, zpack and hydromet have been called to pharm. Pt verbalized understanding.

## 2010-12-23 NOTE — Telephone Encounter (Signed)
Called, spoke with pt.  She c/o cough, wheezing, PND - started on Sunday.  Cough is prod with gray mucus.  Hoarseness today.  Denies increased SOB.  Using mucinex once daily and tussionex which is helping some.  Pt also c/o itching qhs x months.  States she takes a benadryl at night to help with this.  Requesting OV but no openings.  Advised will forward message to CDY for his recs.  She is ok with this.  Pls advise.  Thanks  nkda - verified [facility] [place]

## 2010-12-23 NOTE — Telephone Encounter (Signed)
[id number]

## 2010-12-23 NOTE — Telephone Encounter (Signed)
When I spoke with pharmacist to call in rxs, she advised me that pt just had refill on tussionex 200 ml picked up on 12/21/10, so I cancelled the hydromet and advised pt of this and she verbalized understanding.

## 2010-12-23 NOTE — Telephone Encounter (Signed)
Per CY-okay to give Zpak #1 take as directed no refills, Prednisone 10 mg #20 take 4 x 2 days, 3 x 2 days, 2 x 2 days, 1 x 2 days then stop no refills, and Hydromet cough syrup #200 ml 1 tsp every 6 hours prn cough no refills.

## 2010-12-25 ENCOUNTER — Ambulatory Visit: Payer: Self-pay | Admitting: Internal Medicine

## 2011-01-07 ENCOUNTER — Encounter: Payer: Medicare Other | Attending: Surgery | Admitting: *Deleted

## 2011-01-07 ENCOUNTER — Encounter (INDEPENDENT_AMBULATORY_CARE_PROVIDER_SITE_OTHER): Payer: Self-pay | Admitting: Surgery

## 2011-01-07 DIAGNOSIS — Z9884 Bariatric surgery status: Secondary | ICD-10-CM | POA: Insufficient documentation

## 2011-01-07 DIAGNOSIS — Z713 Dietary counseling and surveillance: Secondary | ICD-10-CM | POA: Insufficient documentation

## 2011-01-07 DIAGNOSIS — Z09 Encounter for follow-up examination after completed treatment for conditions other than malignant neoplasm: Secondary | ICD-10-CM | POA: Insufficient documentation

## 2011-01-15 ENCOUNTER — Other Ambulatory Visit: Payer: Self-pay | Admitting: Gastroenterology

## 2011-01-15 ENCOUNTER — Ambulatory Visit (HOSPITAL_COMMUNITY)
Admission: RE | Admit: 2011-01-15 | Discharge: 2011-01-15 | Disposition: A | Discharge status: Home / Self Care | Payer: Medicare Other | Source: Ambulatory Visit | Attending: Gastroenterology | Admitting: Gastroenterology

## 2011-01-15 DIAGNOSIS — Z79899 Other long term (current) drug therapy: Secondary | ICD-10-CM | POA: Insufficient documentation

## 2011-01-15 DIAGNOSIS — D128 Benign neoplasm of rectum: Secondary | ICD-10-CM | POA: Insufficient documentation

## 2011-01-15 DIAGNOSIS — E119 Type 2 diabetes mellitus without complications: Secondary | ICD-10-CM | POA: Insufficient documentation

## 2011-01-15 DIAGNOSIS — J449 Chronic obstructive pulmonary disease, unspecified: Secondary | ICD-10-CM | POA: Insufficient documentation

## 2011-01-15 DIAGNOSIS — Z09 Encounter for follow-up examination after completed treatment for conditions other than malignant neoplasm: Secondary | ICD-10-CM | POA: Insufficient documentation

## 2011-01-15 DIAGNOSIS — M129 Arthropathy, unspecified: Secondary | ICD-10-CM | POA: Insufficient documentation

## 2011-01-15 DIAGNOSIS — D129 Benign neoplasm of anus and anal canal: Secondary | ICD-10-CM | POA: Insufficient documentation

## 2011-01-15 DIAGNOSIS — K620 Anal polyp: Secondary | ICD-10-CM | POA: Insufficient documentation

## 2011-01-15 DIAGNOSIS — K648 Other hemorrhoids: Secondary | ICD-10-CM | POA: Insufficient documentation

## 2011-01-15 DIAGNOSIS — K621 Rectal polyp: Secondary | ICD-10-CM | POA: Insufficient documentation

## 2011-01-15 DIAGNOSIS — J4489 Other specified chronic obstructive pulmonary disease: Secondary | ICD-10-CM | POA: Insufficient documentation

## 2011-01-15 DIAGNOSIS — K5730 Diverticulosis of large intestine without perforation or abscess without bleeding: Secondary | ICD-10-CM | POA: Insufficient documentation

## 2011-01-15 DIAGNOSIS — Z85118 Personal history of other malignant neoplasm of bronchus and lung: Secondary | ICD-10-CM | POA: Insufficient documentation

## 2011-01-22 NOTE — Op Note (Signed)
  [name], [name]         ACCOUNT NO.:  [id number]  MEDICAL RECORD NO.:  [id number]  LOCATION:  WLEN                         FACILITY:  [facility]  PHYSICIAN:  Shirley Friar, MDDATE OF BIRTH:  11-Oct-1945  DATE OF PROCEDURE:  01/15/2011 DATE OF DISCHARGE:                              OPERATIVE REPORT   PROCEDURE:  Colonoscopy.  INDICATIONS:  History of colon polyps.  MEDICATIONS:  MAC by Anesthesia with fentanyl 100 mcg IV, Versed 2 mg IV, propofol 300 mg IV.  FINDINGS:  Rectal exam was unremarkable.  A pediatric colonoscope was inserted into the rectum and advanced to the sigmoid colon where there was very tortuous colon with diffuse diverticulosis which prevented passage of the pediatric colonoscope despite repeated loop reduction and withdrawal and reinsertion as well as placing the patient in the supine position.  Pediatric colonoscope was withdrawn and an adult colonoscope was inserted into the colon.  The patient was placed back in the left lateral decubitus position and advanced with difficulty to the cecum.  There was an excessive looping and repeated loop reduction was necessary.  The ileocecal valve and appendiceal orifice were identified.  On careful withdrawal of the colonoscope, the colon prep was noted to be fair in the cecum and proximal ascending colon, and repeated irrigation changed the fair prep into a good and adequate prep and the rest of the colon was well prepped.  On careful withdrawal of the colonoscope, there were 5 small sessile to flat polyps seen in the rectosigmoid and rectum, the largest being 4 mm.  These polyps were removed with cold biopsy.  In the sigmoid colon, there were scattered diverticula noted.  The colonoscope was pulled back into the rectum and retroflexion was unsuccessful but on slow withdrawal from the rectum, there were medium-sized internal hemorrhoids noted.  ASSESSMENT: 1. Colon polyps x5 status post removal with  cold biopsy x5. 2. Sigmoid diverticulosis. 3. Medium-sized internal hemorrhoids.  PLAN: 1. No aspirin products for 2 weeks. 2. Followup on path. 3. High-fiber diet.    Shirley Friar, MD    VCS/MEDQ  D:  01/15/2011  T:  01/15/2011  Job:  [id number]  Electronically Signed by Charlott Rakes MD on 01/22/2011 04:25:33 PM

## 2011-02-18 ENCOUNTER — Other Ambulatory Visit: Payer: Self-pay | Admitting: Internal Medicine

## 2011-02-27 ENCOUNTER — Encounter (INDEPENDENT_AMBULATORY_CARE_PROVIDER_SITE_OTHER): Payer: Self-pay

## 2011-03-12 ENCOUNTER — Encounter (INDEPENDENT_AMBULATORY_CARE_PROVIDER_SITE_OTHER): Payer: Self-pay | Admitting: Surgery

## 2011-03-13 ENCOUNTER — Ambulatory Visit (INDEPENDENT_AMBULATORY_CARE_PROVIDER_SITE_OTHER): Payer: Medicare Other | Admitting: Physician Assistant

## 2011-03-13 ENCOUNTER — Encounter (INDEPENDENT_AMBULATORY_CARE_PROVIDER_SITE_OTHER): Payer: Self-pay

## 2011-03-13 VITALS — BP 122/82 | Ht 61.0 in | Wt 248.2 lb

## 2011-03-13 DIAGNOSIS — Z4651 Encounter for fitting and adjustment of gastric lap band: Secondary | ICD-10-CM

## 2011-03-13 NOTE — Patient Instructions (Signed)
Take clear liquids for the next 48 hours. Thin protein shakes are ok to start on Saturday evening. Call us if you have persistent vomiting or regurgitation, night cough or reflux symptoms. Return as scheduled or sooner if you notice no changes in hunger/portion sizes.   

## 2011-03-13 NOTE — Progress Notes (Signed)
  HISTORY: [name] is a 65 y.o.female who received an AP-Standard lap-band in February 2010 by Dr. Ezzard Standing. She comes in today very frustrated with her lack of progress with weight loss with the band. She says her hunger is still present but she has never eaten large portion sizes in the past nor now. She is asking whether conversion to a gastric bypass is possible but in the meantime she would like an adjustment.  VITAL SIGNS: Filed Vitals:   03/13/11 1007  BP: 122/82    PHYSICAL EXAM: Physical exam reveals a very well-appearing 65 y.o.female in no apparent distress Neurologic: Awake, alert, oriented Psych: Bright affect, conversant Respiratory: Breathing even and unlabored. No stridor or wheezing Abdomen: Soft, nontender, nondistended to palpation. Incisions well-healed. No incisional hernias. Port easily palpated. Extremities: Atraumatic, good range of motion.  ASSESMENT: 65 y.o.  female  s/p AP-Standard lap-band.   PLAN: The patient's port was accessed with a 20G Huber needle without difficulty. Clear fluid was aspirated and 0.5 mL saline was added to the port to give a total volume of 6 mL. The patient was able to swallow water without difficulty following the procedure and was instructed to take clear liquids for the next 24-48 hours and advance slowly as tolerated. We'll have her follow-up with Dr. Ezzard Standing in 2 months.

## 2011-04-03 ENCOUNTER — Ambulatory Visit (INDEPENDENT_AMBULATORY_CARE_PROVIDER_SITE_OTHER): Payer: Medicare Other | Admitting: Surgery

## 2011-04-06 ENCOUNTER — Encounter (INDEPENDENT_AMBULATORY_CARE_PROVIDER_SITE_OTHER): Payer: Self-pay

## 2011-04-08 ENCOUNTER — Encounter (INDEPENDENT_AMBULATORY_CARE_PROVIDER_SITE_OTHER): Payer: Self-pay | Admitting: Surgery

## 2011-04-08 ENCOUNTER — Ambulatory Visit (INDEPENDENT_AMBULATORY_CARE_PROVIDER_SITE_OTHER): Payer: Medicare Other | Admitting: Surgery

## 2011-04-08 DIAGNOSIS — Z9884 Bariatric surgery status: Secondary | ICD-10-CM

## 2011-04-08 NOTE — Progress Notes (Signed)
ASSESSMENT AND PLAN: 1.  Lap Band  Placed 09/14/2008.  AP standard.  Originial weight 257, BMI 48.49.  Last band fill 04/08/2011.  Doing much better this time.   She came back to talk about changing to a RYGB, but now she is happy with her band and seems to have the right volume.  I or Mardelle Matte will see her back in 3 months.  2.  Morbid Obesity  3.  Umbilical hernia 4.  History of lung cancer. 5.  COPD.  Followed by Dr. Melba Coon.  She is periodically on steroids for pulmonary flair ups.  She is not right now. 6.  History of back pain and knee pain. 7.  DM.  Type II.  She has been off her meds for 2 month.  HISTORY OF PRESENT ILLNESS: Chief Complaint  Patient presents with  . Other    discuss change from lap band to RNY    [name] is a 65 y.o. (DOB: 1945/08/12)  AA female who is a patient of [name], MD and comes to me today for follow up of lap band.  The patient had been unhappy with her band. She made appointment for the visit today to talk about possibly converting to Roux-en-Y gastric bypass, but since her last band fill she has lost 15 pounds. She has much better restriction from her diet and satisfied with her lap band currently.  We did not talk about further surgery.  We did talk a little about steroids and how they affect her appetite.  She occasionally needs steroids for her lungs.  I think she is doing a lot of things correct now involving her diet. She has been off her diabetes medicine about 2 months.  PHYSICAL EXAM: There were no vitals taken for this visit.  General: Obese AA female. HEENT: Normal. Pupils equal. Normal dentition. Neck: Supple. No thyroid mass. Lymph Nodes:  No supraclavicular or cervical nodes. Lungs: Clear and symmetric. Heart:  RRR. No murmur. Abdomen: Port in the RUQ.  No mass. Extremities:  Good strength in upper and lower extremities. Neurologic:  Grossly intact to motor and sensory function.  DATA REVIEWED: Chart  flow sheet

## 2011-04-09 ENCOUNTER — Ambulatory Visit: Payer: Medicare Other | Admitting: *Deleted

## 2011-04-09 LAB — CREATININE, SERUM
Creatinine, Ser: 0.88
GFR calc non Af Amer: >60

## 2011-04-16 ENCOUNTER — Other Ambulatory Visit: Payer: Self-pay

## 2011-04-16 DIAGNOSIS — M7989 Other specified soft tissue disorders: Secondary | ICD-10-CM

## 2011-04-16 DIAGNOSIS — M79609 Pain in unspecified limb: Secondary | ICD-10-CM

## 2011-04-24 LAB — COMPREHENSIVE METABOLIC PANEL
BUN: 15 mg/dL (ref 6–23)
CO2: 27 mEq/L (ref 19–32)
Chloride: 105 mEq/L (ref 96–112)
Creatinine, Ser: 1.02 mg/dL (ref 0.4–1.2)
GFR calc non Af Amer: 55 mL/min — ABNORMAL LOW (ref >60)
Total Bilirubin: 1 mg/dL (ref 0.3–1.2)

## 2011-04-24 LAB — DIFFERENTIAL
Basophils Absolute: 0 10*3/uL (ref 0.0–0.1)
Lymphocytes Relative: 20 % (ref 12–46)
Neutro Abs: 3.3 10*3/uL (ref 1.7–7.7)
Neutrophils Relative %: 64 % (ref 43–77)

## 2011-04-24 LAB — CBC
HCT: 47.3 % — ABNORMAL HIGH (ref 36.0–46.0)
MCHC: 32.7 g/dL (ref 30.0–36.0)
MCV: 81.8 fL (ref 78.0–100.0)
RBC: 5.78 MIL/uL — ABNORMAL HIGH (ref 3.87–5.11)
WBC: 5.1 10*3/uL (ref 4.0–10.5)

## 2011-05-04 ENCOUNTER — Encounter: Payer: Self-pay | Admitting: Vascular Surgery

## 2011-05-11 ENCOUNTER — Ambulatory Visit (INDEPENDENT_AMBULATORY_CARE_PROVIDER_SITE_OTHER)
Admission: RE | Admit: 2011-05-11 | Discharge: 2011-05-11 | Disposition: A | Discharge status: Home / Self Care | Payer: Medicare Other | Source: Ambulatory Visit | Attending: Internal Medicine | Admitting: Internal Medicine

## 2011-05-11 ENCOUNTER — Ambulatory Visit (INDEPENDENT_AMBULATORY_CARE_PROVIDER_SITE_OTHER): Payer: Medicare Other | Admitting: Internal Medicine

## 2011-05-11 ENCOUNTER — Encounter: Payer: Self-pay | Admitting: Internal Medicine

## 2011-05-11 VITALS — BP 112/64 | HR 69 | Ht 61.0 in | Wt 232.4 lb

## 2011-05-11 DIAGNOSIS — Z23 Encounter for immunization: Secondary | ICD-10-CM

## 2011-05-11 DIAGNOSIS — J4489 Other specified chronic obstructive pulmonary disease: Secondary | ICD-10-CM

## 2011-05-11 DIAGNOSIS — J449 Chronic obstructive pulmonary disease, unspecified: Secondary | ICD-10-CM

## 2011-05-11 DIAGNOSIS — Z87898 Personal history of other specified conditions: Secondary | ICD-10-CM

## 2011-05-11 DIAGNOSIS — Z85118 Personal history of other malignant neoplasm of bronchus and lung: Secondary | ICD-10-CM

## 2011-05-11 MED ORDER — ALBUTEROL SULFATE HFA 108 (90 BASE) MCG/ACT IN AERS: 2.0000 | INHALATION_SPRAY | Freq: Four times a day (QID) | RESPIRATORY_TRACT | Status: DC | PRN

## 2011-05-11 NOTE — Patient Instructions (Addendum)
Order- CXR- COPD, hx LLL lobectomy/ CA  Flu vax

## 2011-05-11 NOTE — Progress Notes (Signed)
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 Last here- 06/27/10 She feels "80% better" since last here. Has been able to lose a little weight which helped. No more hemoptysis. Using pro-air rescue inhaler only if she exerts herself a lot and continues maintenance Spiriva. No cough or recent wheeze. CXR 06/27/2010-NAD, clear.  ROS-see HPI Constitutional:   No-   weight loss, night sweats, fevers, chills, fatigue, lassitude. HEENT:   No-  headaches, difficulty swallowing, tooth/dental problems, sore throat,       No-  sneezing, itching, ear ache, nasal congestion, post nasal drip,  CV:  No-   chest pain, orthopnea, PND, swelling in lower extremities, anasarca, dizziness, palpitations Resp: No-   shortness of breath with exertion or at rest.              No-   productive cough,  No non-productive cough,  No- coughing up of blood.              No-   change in color of mucus.  No- wheezing.   Skin: No-   rash or lesions. GI:  No-   heartburn, indigestion, abdominal pain, nausea, vomiting, diarrhea,                 change in bowel habits, loss of appetite GU: No-   dysuria, change in color of urine, no urgency or frequency.  No- flank pain. MS:  No-   joint pain or swelling.  No- decreased range of motion.  No- back pain. Neuro-     nothing unusual Psych:  No- change in mood or affect. No depression or anxiety.  No memory loss.  OBJ General- Alert, Oriented, Affect-appropriate, Distress- none acute, obese Skin- rash-none, lesions- none, excoriation- none Lymphadenopathy- none Head- atraumatic            Eyes- Gross vision intact, PERRLA, conjunctivae clear secretions            Ears- Hearing, canals-normal            Nose- Clear, no-Septal dev, mucus, polyps, erosion, perforation             Throat- Mallampati II , mucosa clear , drainage- none, tonsils- atrophic Neck- flexible , trachea midline, no stridor  , thyroid nl, carotid no bruit Chest - symmetrical excursion , unlabored           Heart/CV- RRR , no murmur , no gallop  , no rub, nl s1 s2                           - JVD- none , edema- none, stasis changes- none, varices- none           Lung- clear to P&A, wheeze- none, cough- none , dullness-none, rub- none           Chest wall- scar left chest Port-A-Cath Abd- tender-no, distended-no, bowel sounds-present, HSM- no Br/ Gen/ Rectal- Not done, not indicated Extrem- cyanosis- none, clubbing, none, atrophy- none, strength- nl Neuro- grossly intact to observation

## 2011-05-12 NOTE — Assessment & Plan Note (Signed)
No recent hemoptysis. Clinically stable.

## 2011-05-12 NOTE — Assessment & Plan Note (Signed)
Feeling comfortable and stable on present therapy

## 2011-05-19 NOTE — Progress Notes (Signed)
Quick Note:  Pt aware of results. ______ 

## 2011-06-19 ENCOUNTER — Encounter: Payer: Self-pay | Admitting: Vascular Surgery

## 2011-06-22 ENCOUNTER — Other Ambulatory Visit: Payer: Medicare Other | Admitting: Vascular Surgery

## 2011-06-22 ENCOUNTER — Other Ambulatory Visit: Payer: Medicare Other

## 2011-06-22 ENCOUNTER — Ambulatory Visit (INDEPENDENT_AMBULATORY_CARE_PROVIDER_SITE_OTHER): Payer: Medicare Other | Admitting: Vascular Surgery

## 2011-06-22 ENCOUNTER — Encounter: Payer: Self-pay | Admitting: Vascular Surgery

## 2011-06-22 VITALS — BP 132/72 | HR 86 | Resp 20 | Ht 61.0 in | Wt 224.0 lb

## 2011-06-22 DIAGNOSIS — M79609 Pain in unspecified limb: Secondary | ICD-10-CM

## 2011-06-22 DIAGNOSIS — I83893 Varicose veins of bilateral lower extremities with other complications: Secondary | ICD-10-CM

## 2011-06-22 NOTE — Progress Notes (Signed)
Subjective:     Patient ID: [name], female   DOB: 03/05/1946, 65 y.o.   MRN: [id number]  HPI this 65 year old female was referred for venous insufficiency of the left leg. She has no history of definite DVT although she was told by Dr. Tanda Rockers many years ago that she had clots in both legs. She was never treated with Coumadin. She has been noticing some aching discomfort in the left leg over the past few years and has had chronic swelling in the left leg he has no history of stasis ulcers, bleeding, or bulging varicosities. She is not were long light elastic compression stockings or elevate her legs or regular basis.  Past Medical History  Diagnosis Date  . Diabetes mellitus   . COPD (chronic obstructive pulmonary disease)   . Arthritis   . Phlebitis   . DJD (degenerative joint disease)   . Hernia   . Bruises easily   . Wears glasses   . Gum disease   . Breast pain in female   . Abdominal pain     around naval  . Weight increase   . Cancer 2003    lung  . Thyroid disease   . Chronic depression   . Cervical disc disease   . Insomnia   . Hypertension     History  Substance Use Topics  . Smoking status: Former Smoker -- 37 years    Types: Cigarettes    Quit date: 06/21/2002  . Smokeless tobacco: Never Used  . Alcohol Use: No    Family History  Problem Relation Age of Onset  . Stroke Mother   . Stroke Father     No Active Allergies  Current outpatient prescriptions:albuterol (PROAIR HFA) 108 (90 BASE) MCG/ACT inhaler, Inhale 2 puffs into the lungs 4 (four) times daily as needed for wheezing or shortness of breath. NEEDS OV FOR MORE REFILLS, Disp: 1 Inhaler, Rfl: prn;  ALPRAZolam (XANAX) 0.25 MG tablet, as needed., Disp: , Rfl: ;  calcium carbonate (OS-CAL) 600 MG TABS, Take 600 mg by mouth 2 (two) times daily with a meal.  , Disp: , Rfl:  Multiple Vitamin (MULTIVITAMIN) tablet, Take 1 tablet by mouth daily.  , Disp: , Rfl: ;  tiotropium (SPIRIVA) 18 MCG  inhalation capsule, Place 18 mcg into inhaler and inhale daily.  , Disp: , Rfl: ;  traMADol (ULTRAM-ER) 100 MG 24 hr tablet, Take 100 mg by mouth daily.  , Disp: , Rfl:   BP 132/72  Pulse 86  Resp 20  Ht 5\' 1"  (1.549 m)  Wt 224 lb (101.606 kg)  BMI 42.32 kg/m2  Body mass index is 42.32 kg/(m^2).        Review of Systems she complains of change in eyesight, leg discomfort with walking and lying flat, bronchitis, asthma, wheezing, arthritis, joint pain, muscle pain, dyspnea on exertion, depression, anxiety, attention deficit disorder, hiatal hernia, constipation, in dysuria denies all other systems in the complete review of systems     Objective:   Physical Exam blood pressure 130/72 heart rate 86 respirations 20 Generally she is an obese female is in no apparent distress alert and oriented x3 HEENT normal for age Lungs no rhonchi or wheezing Cardiovascular regular and no murmurs carotid pulses 3+ no audible bruits Abdomen obese no palpable masses Musculoskeletal free major deformities Neurologic exam normal In free of rashes Lower extremity exam reveals 3+ femoral and dorsalis pedis pulses palpable bilaterally. There is mild edema in the left calf compared  to the right 1 cm difference in circumference. There are no active ulcerations or bulging varicosities noted she does have multiple spider and reticular veins particularly in the left leg below the knee.  Today I ordered a venous duplex exam of both leg which are reviewed and interpreted. She does have reflux in areas of both great saphenous systems could not throughout the veins are not large. There is no DVT small saphenous veins are unremarkable.    Assessment:    chronic venous insufficiency left leg with scattered areas of reflux in left and right great saphenous systems but no DVT Nothing to treat with laser ablation at this point    Plan:  #1 elevate legs as much as possible during the day #2 short leg elastic  compression stockings on a daily basis #3 if she develops bulging varicosities or worsening of her symptoms or edema return for repeat duplex exam no specific treatment indicated at this time

## 2011-07-27 ENCOUNTER — Telehealth (INDEPENDENT_AMBULATORY_CARE_PROVIDER_SITE_OTHER): Payer: Self-pay | Admitting: Surgery

## 2011-08-05 ENCOUNTER — Encounter (INDEPENDENT_AMBULATORY_CARE_PROVIDER_SITE_OTHER): Payer: Medicare Other | Admitting: Surgery

## 2011-08-07 ENCOUNTER — Encounter (INDEPENDENT_AMBULATORY_CARE_PROVIDER_SITE_OTHER): Payer: Medicare Other

## 2011-08-12 NOTE — Procedures (Unsigned)
LOWER EXTREMITY VENOUS REFLUX EXAM  INDICATION:  Varicose veins and lower extremity pain  EXAM:  Using color-flow imaging and pulse Doppler spectral analysis, the bilateral common femoral, femoral, popliteal, posterior tibial, great and small saphenous veins were evaluated.  There is evidence suggesting deep venous insufficiency in the bilateral lower extremity common femoral veins.  The right saphenofemoral junction is competent.  The left saphenofemoral junction is not competent with reflux of >500 milliseconds.  The bilateral GSV's are not competent with reflux of >500 milliseconds with the calibers as described below.   The bilateral proximal small saphenous veins demonstrate competency.  GSV Diameter (used if found to be incompetent only)                                                    Right     Left Proximal Greater Saphenous Vein                    0.84 cm   0.91 cm Proximal-to-mid-thigh                              0.53 cm   0.68 cm Mid thigh                                          0.52 cm   0.57 cm Mid-distal thigh Distal thigh                                       0.65 cm   0.56 cm Knee                                               0.47 cm   V.V  IMPRESSION: 1. The bilateral great saphenous veins are not competent with reflux     of >500 milliseconds. 2. The right great saphenous vein is not tortuous. 3. The left great saphenous vein at the knee segment is tortuous. 4. The deep venous system bilateral common femoral veins are not     competent with reflux of >500 milliseconds. 5. The bilateral small saphenous veins are competent.  ___________________________________________ [name]. [name], M.D.  SH/MEDQ  D:  06/22/2011  T:  06/22/2011  Job:  [id number]

## 2011-08-21 ENCOUNTER — Encounter (INDEPENDENT_AMBULATORY_CARE_PROVIDER_SITE_OTHER): Payer: Self-pay | Admitting: Surgery

## 2011-08-21 ENCOUNTER — Ambulatory Visit (INDEPENDENT_AMBULATORY_CARE_PROVIDER_SITE_OTHER): Payer: Medicare Other | Admitting: Surgery

## 2011-08-21 ENCOUNTER — Encounter (INDEPENDENT_AMBULATORY_CARE_PROVIDER_SITE_OTHER): Payer: Medicare Other

## 2011-08-21 VITALS — BP 128/81 | HR 74 | Temp 98.2°F | Resp 24 | Ht 60.25 in | Wt 221.2 lb

## 2011-08-21 DIAGNOSIS — Z9884 Bariatric surgery status: Secondary | ICD-10-CM

## 2011-08-21 NOTE — Progress Notes (Signed)
[facility]  [phone]  Office Visit  ASSESSMENT AND PLAN: 1.  Lap Band  Placed 09/14/2008.  AP standard.  Originial weight 257, BMI 48.49.  Last band fill 04/08/2011.  She is doing much better.  She has lost another 11 pounds since I last saw her in Sept 2012.  She can tell a difference in her clothes.  She will see me back in one year - on her lap band anniversary.  2.  Morbid Obesity  3.  Umbilical hernia 4.  History of lung cancer. 5.  COPD.  Followed by Dr. Melba Mitchell.  She is periodically on steroids for pulmonary flair ups.  She is not right now. 6.  History of back pain and knee pain. 7.  DM.  Type II.  She has been off her meds for 2 month.  HISTORY OF PRESENT ILLNESS: Chief Complaint  Patient presents with  . Lap Band Fill    [name] is a 66 y.o. (DOB: July 20, 1946)  AA female who is a patient of Adrienne Parr, MD, MD and comes to me today for follow up of lap band.  She is doing much better.  She knows what she needs to eat.  Ice cream is a weakness of hers.  She is not getting enough exercise. 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  She is to finish this in Dec 2013.  Then she works at night.  We talked about exercise.  She has thought about buying a stationary bike, which I think would be great.  She is doing well enough to see me on an annual basis.  PHYSICAL EXAM: There were no vitals taken for this visit.  General: Obese AA female.  In good spirits.  Abdomen: Port in the RUQ.  No mass.  No tenderness or hernia.  I did not adjust her lap band.  She has an estimated 6.0 cc in the band.  DATA REVIEWED: Chart flow sheet  Adrienne Kin, MD, [facility] Office phone:  [phone]

## 2011-08-21 NOTE — Patient Instructions (Signed)
1.  Keep up the good work on the diet.  Watch out for the ice cream!  2.  Try to increase exercise - treadmill or exercise bike would be great.

## 2011-08-26 ENCOUNTER — Telehealth: Payer: Self-pay | Admitting: Internal Medicine

## 2011-08-26 NOTE — Telephone Encounter (Signed)
I spoke with pt and advised her she did the flu shot in oct, 2012. Pt voiced her understanding and needed nothing further

## 2011-09-18 ENCOUNTER — Ambulatory Visit: Payer: Medicare Other | Admitting: Internal Medicine

## 2011-11-27 ENCOUNTER — Other Ambulatory Visit (HOSPITAL_COMMUNITY): Payer: Self-pay | Admitting: Family Medicine

## 2011-11-27 DIAGNOSIS — Z1231 Encounter for screening mammogram for malignant neoplasm of breast: Secondary | ICD-10-CM

## 2011-12-22 ENCOUNTER — Ambulatory Visit (HOSPITAL_COMMUNITY): Payer: Medicare Other

## 2012-01-15 ENCOUNTER — Ambulatory Visit (HOSPITAL_COMMUNITY)
Admission: RE | Admit: 2012-01-15 | Discharge: 2012-01-15 | Disposition: A | Discharge status: Home / Self Care | Payer: Medicare Other | Source: Ambulatory Visit | Attending: Family Medicine | Admitting: Family Medicine

## 2012-01-15 DIAGNOSIS — Z1231 Encounter for screening mammogram for malignant neoplasm of breast: Secondary | ICD-10-CM | POA: Insufficient documentation

## 2012-01-20 ENCOUNTER — Other Ambulatory Visit: Payer: Self-pay | Admitting: Family Medicine

## 2012-01-20 DIAGNOSIS — R928 Other abnormal and inconclusive findings on diagnostic imaging of breast: Secondary | ICD-10-CM

## 2012-01-27 ENCOUNTER — Ambulatory Visit
Admission: RE | Admit: 2012-01-27 | Discharge: 2012-01-27 | Disposition: A | Discharge status: Home / Self Care | Payer: Medicare Other | Source: Ambulatory Visit | Attending: Family Medicine | Admitting: Family Medicine

## 2012-01-27 DIAGNOSIS — R928 Other abnormal and inconclusive findings on diagnostic imaging of breast: Secondary | ICD-10-CM

## 2012-04-19 ENCOUNTER — Encounter (HOSPITAL_COMMUNITY): Payer: Self-pay | Admitting: Pharmacy Technician

## 2012-04-29 ENCOUNTER — Other Ambulatory Visit: Payer: Self-pay

## 2012-04-29 ENCOUNTER — Emergency Department (HOSPITAL_COMMUNITY): Payer: Medicare Other

## 2012-04-29 ENCOUNTER — Inpatient Hospital Stay (HOSPITAL_COMMUNITY)
Admission: EM | Admit: 2012-04-29 | Discharge: 2012-05-03 | DRG: 287 | Disposition: A | Discharge status: Home / Self Care | Payer: Medicare Other | Attending: Cardiology | Admitting: Cardiology

## 2012-04-29 ENCOUNTER — Encounter (HOSPITAL_COMMUNITY): Payer: Self-pay | Admitting: *Deleted

## 2012-04-29 DIAGNOSIS — Z7982 Long term (current) use of aspirin: Secondary | ICD-10-CM

## 2012-04-29 DIAGNOSIS — E669 Obesity, unspecified: Secondary | ICD-10-CM | POA: Diagnosis present

## 2012-04-29 DIAGNOSIS — E119 Type 2 diabetes mellitus without complications: Secondary | ICD-10-CM | POA: Diagnosis present

## 2012-04-29 DIAGNOSIS — Z9884 Bariatric surgery status: Secondary | ICD-10-CM

## 2012-04-29 DIAGNOSIS — Z823 Family history of stroke: Secondary | ICD-10-CM

## 2012-04-29 DIAGNOSIS — E785 Hyperlipidemia, unspecified: Secondary | ICD-10-CM | POA: Diagnosis present

## 2012-04-29 DIAGNOSIS — I10 Essential (primary) hypertension: Secondary | ICD-10-CM | POA: Diagnosis present

## 2012-04-29 DIAGNOSIS — F329 Major depressive disorder, single episode, unspecified: Secondary | ICD-10-CM | POA: Diagnosis present

## 2012-04-29 DIAGNOSIS — J449 Chronic obstructive pulmonary disease, unspecified: Secondary | ICD-10-CM | POA: Diagnosis present

## 2012-04-29 DIAGNOSIS — Z6841 Body Mass Index (BMI) 40.0 and over, adult: Secondary | ICD-10-CM

## 2012-04-29 DIAGNOSIS — F3289 Other specified depressive episodes: Secondary | ICD-10-CM | POA: Diagnosis present

## 2012-04-29 DIAGNOSIS — I200 Unstable angina: Secondary | ICD-10-CM

## 2012-04-29 DIAGNOSIS — Z87891 Personal history of nicotine dependence: Secondary | ICD-10-CM

## 2012-04-29 DIAGNOSIS — I9589 Other hypotension: Secondary | ICD-10-CM | POA: Diagnosis not present

## 2012-04-29 DIAGNOSIS — J4489 Other specified chronic obstructive pulmonary disease: Secondary | ICD-10-CM | POA: Diagnosis present

## 2012-04-29 DIAGNOSIS — R0789 Other chest pain: Principal | ICD-10-CM | POA: Diagnosis present

## 2012-04-29 DIAGNOSIS — Z79899 Other long term (current) drug therapy: Secondary | ICD-10-CM

## 2012-04-29 LAB — COMPREHENSIVE METABOLIC PANEL
Albumin: 3.8 g/dL (ref 3.5–5.2)
Alkaline Phosphatase: 76 U/L (ref 39–117)
BUN: 12 mg/dL (ref 6–23)
Chloride: 105 mEq/L (ref 96–112)
Potassium: 4.7 mEq/L (ref 3.5–5.1)
Total Bilirubin: 0.2 mg/dL — ABNORMAL LOW (ref 0.3–1.2)

## 2012-04-29 LAB — CBC
HCT: 45.3 % (ref 36.0–46.0)
Hemoglobin: 14.6 g/dL (ref 12.0–15.0)
RBC: 5.46 MIL/uL — ABNORMAL HIGH (ref 3.87–5.11)
RDW: 15.5 % (ref 11.5–15.5)
WBC: 6.5 10*3/uL (ref 4.0–10.5)

## 2012-04-29 LAB — POCT I-STAT TROPONIN I: Troponin i, poc: 0.02 ng/mL (ref 0.00–0.08)

## 2012-04-29 MED ORDER — METOPROLOL TARTRATE 25 MG PO TABS
25.0000 mg | ORAL_TABLET | Freq: Two times a day (BID) | ORAL | Status: DC
  Administered 2012-04-29 – 2012-05-01 (×4): 25 mg via ORAL
  Filled 2012-04-29 (×7): qty 1

## 2012-04-29 MED ORDER — ALPRAZOLAM 0.25 MG PO TABS
0.2500 mg | ORAL_TABLET | Freq: Every evening | ORAL | Status: DC | PRN
  Administered 2012-04-30 – 2012-05-02 (×3): 0.25 mg via ORAL
  Filled 2012-04-29 (×3): qty 1

## 2012-04-29 MED ORDER — LORATADINE 10 MG PO TABS
10.0000 mg | ORAL_TABLET | ORAL | Status: AC
  Filled 2012-04-29 (×2): qty 1

## 2012-04-29 MED ORDER — EZETIMIBE-SIMVASTATIN 10-40 MG PO TABS
1.0000 | ORAL_TABLET | ORAL | Status: AC
  Administered 2012-04-30: 1 via ORAL
  Filled 2012-04-29 (×2): qty 1

## 2012-04-29 MED ORDER — ASPIRIN EC 325 MG PO TBEC
325.0000 mg | DELAYED_RELEASE_TABLET | Freq: Every day | ORAL | Status: DC
  Administered 2012-04-30: 325 mg via ORAL
  Filled 2012-04-29 (×2): qty 1

## 2012-04-29 MED ORDER — ALBUTEROL SULFATE HFA 108 (90 BASE) MCG/ACT IN AERS: 2.0000 | INHALATION_SPRAY | Freq: Four times a day (QID) | RESPIRATORY_TRACT | Status: DC | PRN

## 2012-04-29 MED ORDER — CLOPIDOGREL BISULFATE 75 MG PO TABS
75.0000 mg | ORAL_TABLET | Freq: Every day | ORAL | Status: DC
  Administered 2012-04-30 – 2012-05-03 (×4): 75 mg via ORAL
  Filled 2012-04-29 (×4): qty 1

## 2012-04-29 NOTE — ED Notes (Signed)
Patient was told by her MD, Nolon Rod that if she started having chest pain she should respond to the ED.  Patient is scheduled to have a cardiac catheterization for Tuesday.

## 2012-04-29 NOTE — ED Notes (Addendum)
Patient with recurrent chest pain since 09/26 and today pain got worse.  Patient has been taking nitro for relief and it would help but the pain would come back.  Patient did become nauseated, pain radiated down to her left breast.  Patient is under MD care for it.  Patient is scheduled to have a cath on tuesday

## 2012-04-29 NOTE — ED Notes (Signed)
Family at bedside. 

## 2012-04-29 NOTE — ED Provider Notes (Signed)
History     CSN: [id number]  Arrival date & time 04/29/12  4098   First MD Initiated Contact with Patient 04/29/12 2034      Chief Complaint  Patient presents with  . Chest Pain    (Consider location/radiation/quality/duration/timing/severity/associated sxs/prior treatment) Patient is a 66 y.o. female presenting with chest pain. The history is provided by the patient.  Chest Pain The chest pain began 3 - 5 hours ago. Duration of episode(s) is 2 hours. Chest pain occurs constantly. The chest pain is resolved. Associated with: Started at rest. At its most intense, the pain is at 7/10. The pain is currently at 0/10. The severity of the pain is moderate. The quality of the pain is described as dull, pressure-like and squeezing. The pain radiates to the left shoulder. Primary symptoms include cough. Pertinent negatives for primary symptoms include no shortness of breath, no wheezing, no palpitations, no nausea and no vomiting.  Pertinent negatives for associated symptoms include no diaphoresis. She tried nothing for the symptoms.  Her past medical history is significant for diabetes, hyperlipidemia and hypertension.     Past Medical History  Diagnosis Date  . Diabetes mellitus   . COPD (chronic obstructive pulmonary disease)   . Arthritis   . Phlebitis   . DJD (degenerative joint disease)   . Hernia   . Bruises easily   . Wears glasses   . Gum disease   . Breast pain in female   . Abdominal pain     around naval  . Weight increase   . Cancer 2003    lung  . Thyroid disease   . Chronic depression   . Cervical disc disease   . Insomnia   . Hypertension     Past Surgical History  Procedure Date  . Laparoscopic gastric banding Feb 2010  . Lung lobectomy 2003    left  . Foot surgery     right  . Abdominal hysterectomy     partial  . Thyroidectomy     partial  . Tonsillectomy   . Spine surgery     fusion on the neck    Family History  Problem Relation Age of  Onset  . Stroke Mother   . Stroke Father     History  Substance Use Topics  . Smoking status: Former Smoker -- 37 years    Types: Cigarettes    Quit date: 06/21/2002  . Smokeless tobacco: Never Used  . Alcohol Use: No    OB History    Grav Para Term Preterm Abortions TAB SAB Ect Mult Living                  Review of Systems  Constitutional: Negative for diaphoresis.  Respiratory: Positive for cough. Negative for shortness of breath and wheezing.   Cardiovascular: Positive for chest pain. Negative for palpitations.  Gastrointestinal: Negative for nausea and vomiting.  All other systems reviewed and are negative.    Allergies  Review of patient's allergies indicates no known allergies.  Home Medications   Current Outpatient Rx  Name Route Sig Dispense Refill  . ALBUTEROL SULFATE HFA 108 (90 BASE) MCG/ACT IN AERS Inhalation Inhale 2 puffs into the lungs every 6 (six) hours as needed. For shortness of breath    . ALPRAZOLAM 0.25 MG PO TABS Oral Take 0.25 mg by mouth at bedtime as needed. For sleep    . ASPIRIN EC 325 MG PO TBEC Oral Take 325 mg by mouth daily.    Marland Kitchen  CLOPIDOGREL BISULFATE 75 MG PO TABS Oral Take 75 mg by mouth daily.    Marland Kitchen DIPHENHYDRAMINE HCL 25 MG PO CAPS Oral Take 25 mg by mouth daily as needed. For itching    . EZETIMIBE-SIMVASTATIN 10-40 MG PO TABS Oral Take 1 tablet by mouth at bedtime.    Marland Kitchen FEXOFENADINE HCL 180 MG PO TABS Oral Take 180 mg by mouth daily.    Marland Kitchen METOPROLOL TARTRATE 25 MG PO TABS Oral Take 25 mg by mouth 2 (two) times daily.    . ADULT MULTIVITAMIN W/MINERALS CH Oral Take 1 tablet by mouth daily.    Frazier Butt FREE OP Both Eyes Place 1 drop into both eyes daily as needed. For red eyes      BP 119/80  Pulse 65  Temp 98.6 F (37 C) (Oral)  Resp 16  SpO2 98%  Physical Exam  Nursing note and vitals reviewed. Constitutional: She is oriented to person, place, and time. She appears well-developed and well-nourished. No distress.  HENT:    Head: Normocephalic and atraumatic.  Mouth/Throat: Oropharynx is clear and moist.  Eyes: Conjunctivae normal and EOM are normal. Pupils are equal, round, and reactive to light.  Neck: Normal range of motion. Neck supple.  Cardiovascular: Normal rate, regular rhythm and intact distal pulses.   No murmur heard. Pulmonary/Chest: Effort normal and breath sounds normal. No respiratory distress. She has no wheezes. She has no rales.  Abdominal: Soft. She exhibits no distension. There is no tenderness. There is no rebound and no guarding.  Musculoskeletal: Normal range of motion. She exhibits no edema and no tenderness.  Neurological: She is alert and oriented to person, place, and time.  Skin: Skin is warm and dry. No rash noted. No erythema.  Psychiatric: She has a normal mood and affect. Her behavior is normal.    ED Course  Procedures (including critical care time)  Labs Reviewed  CBC - Abnormal; Notable for the following:    RBC 5.46 (*)     All other components within normal limits  COMPREHENSIVE METABOLIC PANEL - Abnormal; Notable for the following:    Total Bilirubin 0.2 (*)     GFR calc non Af Amer 55 (*)     GFR calc Af Amer 63 (*)     All other components within normal limits  POCT I-STAT TROPONIN I   Dg Chest 2 View  04/29/2012  *RADIOLOGY REPORT*  Clinical Data: Chest pain and shortness of breath  CHEST - 2 VIEW  Comparison: 05/11/2011  Findings: The cardiac silhouette is normal in size and configuration.  No mediastinal or hilar mass or adenopathy. Postsurgical changes from left lung surgery including rib defects of the left fourth and fifth ribs are stable.  The lungs are clear. No pleural effusion or pneumothorax.  IMPRESSION: No acute cardiopulmonary disease.  No change from the prior study.   Original Report Authenticated By: Domenic Moras, M.D.     Date: 04/29/2012  Rate: 62  Rhythm: normal sinus rhythm  QRS Axis: left  Intervals: normal  ST/T Wave abnormalities:  Diffuse T-wave inversion  Conduction Disutrbances:none  Narrative Interpretation:   Old EKG Reviewed: changes noted    No diagnosis found.    MDM   Patient with complaints of chest pain that have been intermittent since the end of September. Today she had the pain in her left breast going into her left arm which did not go away with nitroglycerin. She's taken 5 nitroglycerin and the pain is  still persistent. On EKG patient had significant T wave inversion in diffusely. Which is new from a prior EKG which I have from 2009. Patient sees Dr. Jacinto Halim and has a scheduled catheterization on Tuesday. Currently patient is pain-free she is already taken aspirin 325 and Plavix and nitroglycerin.  Troponin, chest x-ray and labs are within normal limits. Patient is currently pain free on exam. Will discuss the patient with her cardiologist for further care        Gwyneth Sprout, MD 04/29/12 2348

## 2012-04-30 ENCOUNTER — Other Ambulatory Visit: Payer: Self-pay

## 2012-04-30 LAB — TROPONIN I: Troponin I: <0.3 ng/mL (ref <0.30)

## 2012-04-30 MED ORDER — ASPIRIN EC 81 MG PO TBEC
81.0000 mg | DELAYED_RELEASE_TABLET | Freq: Every day | ORAL | Status: DC
  Administered 2012-05-01: 81 mg via ORAL
  Filled 2012-04-30: qty 1

## 2012-04-30 MED ORDER — DIPHENHYDRAMINE HCL 25 MG PO CAPS: 25.0000 mg | ORAL_CAPSULE | Freq: Three times a day (TID) | ORAL | Status: DC | PRN

## 2012-04-30 MED ORDER — NITROGLYCERIN 2 % TD OINT
0.5000 [in_us] | TOPICAL_OINTMENT | Freq: Four times a day (QID) | TRANSDERMAL | Status: DC
Start: 2012-04-30 — End: 2012-05-02
  Administered 2012-04-30 – 2012-05-02 (×8): 0.5 [in_us] via TOPICAL
  Filled 2012-04-30: qty 30

## 2012-04-30 MED ORDER — ASPIRIN 300 MG RE SUPP
300.0000 mg | RECTAL | Status: AC
  Filled 2012-04-30: qty 1

## 2012-04-30 MED ORDER — ACETAMINOPHEN 325 MG PO TABS: 650.0000 mg | ORAL_TABLET | ORAL | Status: DC | PRN

## 2012-04-30 MED ORDER — ONDANSETRON HCL 4 MG/2ML IJ SOLN: 4.0000 mg | Freq: Four times a day (QID) | INTRAMUSCULAR | Status: DC | PRN

## 2012-04-30 MED ORDER — NITROGLYCERIN 0.4 MG SL SUBL: 0.4000 mg | SUBLINGUAL_TABLET | SUBLINGUAL | Status: DC | PRN

## 2012-04-30 MED ORDER — SODIUM CHLORIDE 0.9 % IJ SOLN: 3.0000 mL | Freq: Two times a day (BID) | INTRAMUSCULAR | Status: DC

## 2012-04-30 MED ORDER — ASPIRIN 81 MG PO CHEW: 324.0000 mg | CHEWABLE_TABLET | ORAL | Status: AC

## 2012-04-30 MED ORDER — MAGNESIUM HYDROXIDE 400 MG/5ML PO SUSP
30.0000 mL | Freq: Every day | ORAL | Status: DC | PRN
  Administered 2012-04-30: 30 mL via ORAL
  Filled 2012-04-30: qty 30

## 2012-04-30 MED ORDER — ENOXAPARIN SODIUM 100 MG/ML ~~LOC~~ SOLN
1.0000 mg/kg | Freq: Two times a day (BID) | SUBCUTANEOUS | Status: DC
  Administered 2012-04-30 – 2012-05-02 (×6): 95 mg via SUBCUTANEOUS
  Filled 2012-04-30 (×8): qty 1

## 2012-04-30 NOTE — H&P (Signed)
[name] is an 66 y.o. female.   Chief Complaint: Chest pain HPI: Patient is a 66 year old African American female with history of diabetes, obesity, hyperlipidemia and COPD with prior tobacco use, presenting with chest discomfort that started about to 3 weeks ago. I've seen her in the office about 10 days ago and her symptoms were typical for exertional angina pectoris with unstable angina that occurred probably week prior to me seeing her and she has had outpatient lab drawn which had revealed positive troponins. However patient had been asymptomatic when I saw her without any chest discomfort. I had initiated aggressive medical therapy with planned outpatient cardiac catheterization. I did before patient echocardiogram which had revealed result left systolic function without any regional wall motion modality. She had been doing well and has been using sublingual nitroglycerin approximately one a day with immediate relief of chest discomfort. Yesterday she had chest heaviness with radiation to the left arm associated with dizziness and used several sublingual nitroglycerin finally presented to the emergency department. By the time she came to the emergency room, the chest pain has abated. However I kept her here due to her multiple cardiovascular risk factors and symptoms suggesting unstable angina. Patient is presently having minimal chest discomfort that last a few seconds to a few minutes but  spontaneously subsides. She states that she's back to her baseline and feels comfortable. She denies any shortness of breath, PND or orthopnea with the chest discomfort. She has chronic baseline shortness of breath which has not significantly changed. Her daughter is at the bedside.  Past Medical History  Diagnosis Date  . Diabetes mellitus   . COPD (chronic obstructive pulmonary disease)   . Arthritis   . Phlebitis   . DJD (degenerative joint disease)   . Hernia   . Bruises easily   . Wears  glasses   . Gum disease   . Breast pain in female   . Abdominal pain     around naval  . Weight increase   . Cancer 2003    lung  . Thyroid disease   . Chronic depression   . Cervical disc disease   . Insomnia     Past Surgical History  Procedure Date  . Laparoscopic gastric banding Feb 2010  . Lung lobectomy 2003    left  . Foot surgery     right  . Abdominal hysterectomy     partial  . Thyroidectomy     partial  . Tonsillectomy   . Spine surgery     fusion on the neck    Family History  Problem Relation Age of Onset  . Stroke Mother   . Stroke Father    Social History:  reports that she quit smoking about 9 years ago. Her smoking use included Cigarettes. She quit after 37 years of use. She has never used smokeless tobacco. She reports that she does not drink alcohol or use illicit drugs.  Allergies: No Known Allergies  Medications Prior to Admission  Medication Sig Dispense Refill  . albuterol (PROVENTIL HFA;VENTOLIN HFA) 108 (90 BASE) MCG/ACT inhaler Inhale 2 puffs into the lungs every 6 (six) hours as needed. For shortness of breath      . ALPRAZolam (XANAX) 0.25 MG tablet Take 0.25 mg by mouth at bedtime as needed. For sleep      . aspirin EC 325 MG tablet Take 325 mg by mouth daily.      . clopidogrel (PLAVIX) 75 MG tablet Take 75  mg by mouth daily.      . diphenhydrAMINE (BENADRYL) 25 mg capsule Take 25 mg by mouth daily as needed. For itching      . ezetimibe-simvastatin (VYTORIN) 10-40 MG per tablet Take 1 tablet by mouth at bedtime.      . fexofenadine (ALLEGRA) 180 MG tablet Take 180 mg by mouth daily.      . metoprolol tartrate (LOPRESSOR) 25 MG tablet Take 25 mg by mouth 2 (two) times daily.      . Multiple Vitamin (MULTIVITAMIN WITH MINERALS) TABS Take 1 tablet by mouth daily.      Bertram Gala Glycol-Propyl Glycol (SYSTANE FREE OP) Place 1 drop into both eyes daily as needed. For red eyes        Results for orders placed during the hospital encounter  of 04/29/12 (from the past 48 hour(s))  CBC     Status: Abnormal   Collection Time   04/29/12  7:59 PM      Component Value Range Comment   WBC 6.5  4.0 - 10.5 K/uL    RBC 5.46 (*) 3.87 - 5.11 MIL/uL    Hemoglobin 14.6  12.0 - 15.0 g/dL    HCT 91.4  78.2 - 95.6 %    MCV 83.0  78.0 - 100.0 fL    MCH 26.7  26.0 - 34.0 pg    MCHC 32.2  30.0 - 36.0 g/dL    RDW 21.3  08.6 - 57.8 %    Platelets 200  150 - 400 K/uL   COMPREHENSIVE METABOLIC PANEL     Status: Abnormal   Collection Time   04/29/12  7:59 PM      Component Value Range Comment   Sodium 144  135 - 145 mEq/L    Potassium 4.7  3.5 - 5.1 mEq/L    Chloride 105  96 - 112 mEq/L    CO2 30  19 - 32 mEq/L    Glucose, Bld 94  70 - 99 mg/dL    BUN 12  6 - 23 mg/dL    Creatinine, Ser 4.69  0.50 - 1.10 mg/dL    Calcium 9.8  8.4 - 62.9 mg/dL    Total Protein 6.8  6.0 - 8.3 g/dL    Albumin 3.8  3.5 - 5.2 g/dL    AST 17  0 - 37 U/L    ALT 10  0 - 35 U/L    Alkaline Phosphatase 76  39 - 117 U/L    Total Bilirubin 0.2 (*) 0.3 - 1.2 mg/dL    GFR calc non Af Amer 55 (*) >90 mL/min    GFR calc Af Amer 63 (*) >90 mL/min   POCT I-STAT TROPONIN I     Status: Normal   Collection Time   04/29/12  8:33 PM      Component Value Range Comment   Troponin i, poc 0.02  0.00 - 0.08 ng/mL    Comment 3            TROPONIN I     Status: Normal   Collection Time   04/29/12 10:36 PM      Component Value Range Comment   Troponin I <0.30  <0.30 ng/mL   TROPONIN I     Status: Normal   Collection Time   04/30/12  6:05 AM      Component Value Range Comment   Troponin I <0.30  <0.30 ng/mL   TROPONIN I     Status: Normal  Collection Time   04/30/12 10:18 AM      Component Value Range Comment   Troponin I <0.30  <0.30 ng/mL    Dg Chest 2 View  04/29/2012  *RADIOLOGY REPORT*  Clinical Data: Chest pain and shortness of breath  CHEST - 2 VIEW  Comparison: 05/11/2011  Findings: The cardiac silhouette is normal in size and configuration.  No mediastinal  or hilar mass or adenopathy. Postsurgical changes from left lung surgery including rib defects of the left fourth and fifth ribs are stable.  The lungs are clear. No pleural effusion or pneumothorax.  IMPRESSION: No acute cardiopulmonary disease.  No change from the prior study.   Original Report Authenticated By: Domenic Moras, M.D.     Review of Systems - General ROS: positive for  - diabetes diet controlled negative for - chills, fatigue, fever or hot flashes Respiratory ROS: no cough, shortness of breath, or wheezing With chest pain. No hemoptysis. Cardiovascular ROS: positive for - chest pain and dyspnea on exertion   Blood pressure 105/70, pulse 55, temperature 98.4 F (36.9 C), temperature source Oral, resp. rate 19, height 5\' 1"  (1.549 m), weight 97.251 kg (214 lb 6.4 oz), SpO2 94.00%. General appearance: alert, cooperative and appears stated age Eyes: conjunctivae/corneas clear. PERRL, EOM's intact. Fundi benign. Neck: no adenopathy, no carotid bruit, no JVD, supple, symmetrical, trachea midline and thyroid not enlarged, symmetric, no tenderness/mass/nodules Neck: JVP - normal, carotids 2+= without bruits Resp: clear to auscultation bilaterally Chest wall: no tenderness Cardio: regular rate and rhythm, S1, S2 normal, no murmur, click, rub or gallop GI: soft, non-tender; bowel sounds normal; no masses,  no organomegaly and obese Extremities: extremities normal, atraumatic, no cyanosis or edema Pulses: 2+ and symmetric Skin: Skin color, texture, turgor normal. No rashes or lesions Neurologic: Alert and oriented X 3, normal strength and tone. Normal symmetric reflexes. Normal coordination and gait   Assessment/Plan 1. Chest pain suggestive of unstable angina pectoris 2. Abnormal EKG. EKG performed on 04/29/2012 and 04/30/2012 revealed normal sinus rhythm, normal axis, inferior and lateral deep T wave inversion suggestive of inferolateral ischemia versus subendocardial myocardial  infarction. QT is mildly prolonged. 3. Obesity, moderate. History of bariatric surgery in the remote past. 4. Hypertension 5. Hyperlipidemia 6. Diabetes mellitus type 2 controlled, diet  Recommendation: Patient is extremely high risk for cardiovascular events. Markedly abnormal EKG with ongoing chest discomfort. Patient is presently stable in the cardiac markers have been negative myocardial injury. Her echocardiogram that was done a week ago had revealed preserved left systolic function with mild left hypertrophy and mild diastolic dysfunction without any significant valvular abnormality. I will admit the patient and start her on subcutaneous Lovenox and also nitroglycerin paste. She was planned to have outpatient cardiac catheterization on Tuesday, however I will change it to inpatient cardiac catheterization on Monday morning given her ongoing chest discomfort and unstable angina pectoris. I have met the patient and her daughter and explained to them regarding the risks, benefits, alternatives to cardiac catheterization and is willing to proceed.  Pamella Pert, MD 04/30/2012, 11:26 AM

## 2012-05-01 LAB — LIPID PANEL
HDL: 47 mg/dL (ref >39)
LDL Cholesterol: 60 mg/dL (ref 0–99)
Total CHOL/HDL Ratio: 2.6 RATIO
VLDL: 15 mg/dL (ref 0–40)

## 2012-05-01 LAB — CBC WITH DIFFERENTIAL/PLATELET
Eosinophils Absolute: 0.3 10*3/uL (ref 0.0–0.7)
Eosinophils Relative: 5 % (ref 0–5)
Hemoglobin: 14.3 g/dL (ref 12.0–15.0)
Lymphs Abs: 1.6 10*3/uL (ref 0.7–4.0)
MCH: 26.7 pg (ref 26.0–34.0)
MCV: 82.6 fL (ref 78.0–100.0)
Monocytes Absolute: 0.7 10*3/uL (ref 0.1–1.0)
Monocytes Relative: 14 % — ABNORMAL HIGH (ref 3–12)
RBC: 5.36 MIL/uL — ABNORMAL HIGH (ref 3.87–5.11)

## 2012-05-01 MED ORDER — ACTIVE PARTNERSHIP FOR HEALTH OF YOUR HEART BOOK
Freq: Once | Status: AC
  Administered 2012-05-01: 15:00:00
  Filled 2012-05-01 (×2): qty 1

## 2012-05-01 MED ORDER — SODIUM CHLORIDE 0.9 % IV SOLN
1.0000 mL/kg/h | INTRAVENOUS | Status: DC
  Administered 2012-05-02: 1 mL/kg/h via INTRAVENOUS

## 2012-05-01 MED ORDER — SODIUM CHLORIDE 0.9 % IV SOLN: 250.0000 mL | INTRAVENOUS | Status: DC | PRN

## 2012-05-01 MED ORDER — ASPIRIN 81 MG PO CHEW
324.0000 mg | CHEWABLE_TABLET | ORAL | Status: AC
  Administered 2012-05-02: 324 mg via ORAL
  Filled 2012-05-01: qty 4

## 2012-05-01 MED ORDER — SODIUM CHLORIDE 0.9 % IJ SOLN
3.0000 mL | Freq: Two times a day (BID) | INTRAMUSCULAR | Status: DC
  Administered 2012-05-01 – 2012-05-02 (×2): 3 mL via INTRAVENOUS

## 2012-05-01 MED ORDER — ASPIRIN EC 81 MG PO TBEC
81.0000 mg | DELAYED_RELEASE_TABLET | Freq: Every day | ORAL | Status: DC
  Administered 2012-05-03: 81 mg via ORAL
  Filled 2012-05-01: qty 1

## 2012-05-01 MED ORDER — SODIUM CHLORIDE 0.9 % IJ SOLN: 3.0000 mL | INTRAMUSCULAR | Status: DC | PRN

## 2012-05-01 NOTE — Progress Notes (Signed)
Subjective:  Patient presently not having any chest discomfort since starting nitroglycerin paste and subcutaneous Lovenox. Slept well no new symptomatology. Objective:  Vital Signs in the last 24 hours: Temp:  [98 F (36.7 C)-98.3 F (36.8 C)] 98.3 F (36.8 C) (10/13 1705) Pulse Rate:  [57-102] 67  (10/13 1705) Resp:  [16-18] 16  (10/13 1705) BP: (98-111)/(51-64) 111/64 mmHg (10/13 1705) SpO2:  [95 %-99 %] 99 % (10/13 1705)  Intake/Output from previous day: 10/12 0701 - 10/13 0700 In: 720 [P.O.:720] Out: -   Physical Exam:  General appearance: alert, cooperative and appears stated age  Eyes: conjunctivae/corneas clear. PERRL, EOM's intact. Fundi benign.  Neck: no adenopathy, no carotid bruit, no JVD, supple, symmetrical, trachea midline and thyroid not enlarged, symmetric, no tenderness/mass/nodules  Neck: JVP - normal, carotids 2+= without bruits  Resp: clear to auscultation bilaterally  Chest wall: no tenderness  Cardio: regular rate and rhythm, S1, S2 normal, no murmur, click, rub or gallop  GI: soft, non-tender; bowel sounds normal; no masses, no organomegaly and obese  Extremities: extremities normal, atraumatic, no cyanosis or edema  Pulses: 2+ and symmetric  Skin: Skin color, texture, turgor normal. No rashes or lesions  Neurologic: Alert and oriented X 3, normal strength and tone. Normal symmetric reflexes. Normal coordination and gait       Lab Results:  Basename 05/01/12 0610 04/29/12 1959  WBC 5.3 6.5  HGB 14.3 14.6  PLT 180 200    Basename 04/29/12 1959  NA 144  K 4.7  CL 105  CO2 30  GLUCOSE 94  BUN 12  CREATININE 1.04    Basename 04/30/12 1018 04/30/12 0605  TROPONINI <0.30 <0.30   Hepatic Function Panel  Basename 04/29/12 1959  PROT 6.8  ALBUMIN 3.8  AST 17  ALT 10  ALKPHOS 76  BILITOT 0.2*  BILIDIR --  IBILI --   Lipid Panel     Component Value Date/Time   CHOL 122 05/01/2012 0610   TRIG 76 05/01/2012 0610   HDL 47 05/01/2012  0610   CHOLHDL 2.6 05/01/2012 0610   VLDL 15 05/01/2012 0610   LDLCALC 60 05/01/2012 0610      Assessment/Plan:  1. Chest pain suggestive of unstable angina pectoris  2. Abnormal EKG. EKG performed on 04/29/2012 and 04/30/2012 and 04/21/12:  revealed normal sinus rhythm, normal axis, inferior and lateral deep T wave inversion suggestive of inferolateral ischemia versus subendocardial myocardial infarction. QT is mildly prolonged.  3. Obesity, moderate. History of bariatric surgery in the remote past.  4. Hypertension  5. Hyperlipidemia  6. Diabetes mellitus type 2 controlled, diet  Recommendation: As patient remained stable without any further chest discomfort, we will continue to observe her. Patient is scheduled for cardiac catheterization in the morning. All questions were answered. Patient is alreadyseen the video  for the cardiac catheterization.  Hyatt Capobianco,JAGADEESH R, M.D. 05/01/2012, 5:27 PM  

## 2012-05-01 NOTE — Plan of Care (Signed)
Problem: Phase II Progression Outcomes Goal: Cath/PCI Day Path if indicated Outcome: Completed/Met Date Met:  05/01/12 Monday 05/02/12, #115 cath video watched, cath brochure given

## 2012-05-02 ENCOUNTER — Encounter (HOSPITAL_COMMUNITY)
Admission: EM | Disposition: A | Discharge status: Home / Self Care | Payer: Self-pay | Source: Home / Self Care | Attending: Cardiology

## 2012-05-02 ENCOUNTER — Ambulatory Visit (HOSPITAL_COMMUNITY): Admission: RE | Admit: 2012-05-02 | Payer: Medicare Other | Source: Ambulatory Visit | Admitting: Cardiology

## 2012-05-02 HISTORY — PX: LEFT HEART CATHETERIZATION WITH CORONARY ANGIOGRAM: SHX5451

## 2012-05-02 LAB — BASIC METABOLIC PANEL
BUN: 9 mg/dL (ref 6–23)
Creatinine, Ser: 0.79 mg/dL (ref 0.50–1.10)
GFR calc Af Amer: >90 mL/min (ref >90)
GFR calc non Af Amer: 85 mL/min — ABNORMAL LOW (ref >90)
Potassium: 5.1 mEq/L (ref 3.5–5.1)

## 2012-05-02 LAB — CBC
HCT: 43 % (ref 36.0–46.0)
MCHC: 32.8 g/dL (ref 30.0–36.0)
RDW: 15.7 % — ABNORMAL HIGH (ref 11.5–15.5)

## 2012-05-02 LAB — GLUCOSE, CAPILLARY: Glucose-Capillary: 96 mg/dL (ref 70–99)

## 2012-05-02 SURGERY — LEFT HEART CATHETERIZATION WITH CORONARY ANGIOGRAM
Anesthesia: LOCAL

## 2012-05-02 MED ORDER — HEPARIN (PORCINE) IN NACL 2-0.9 UNIT/ML-% IJ SOLN
INTRAMUSCULAR | Status: AC
  Filled 2012-05-02: qty 1000

## 2012-05-02 MED ORDER — ONDANSETRON HCL 4 MG/2ML IJ SOLN
4.0000 mg | Freq: Four times a day (QID) | INTRAMUSCULAR | Status: DC | PRN
  Administered 2012-05-02: 4 mg via INTRAVENOUS

## 2012-05-02 MED ORDER — NITROGLYCERIN 0.2 MG/ML ON CALL CATH LAB
INTRAVENOUS | Status: AC
  Filled 2012-05-02: qty 1

## 2012-05-02 MED ORDER — ACETAMINOPHEN 325 MG PO TABS: 650.0000 mg | ORAL_TABLET | ORAL | Status: DC | PRN

## 2012-05-02 MED ORDER — SODIUM CHLORIDE 0.9 % IV SOLN: 1.0000 mL/kg/h | INTRAVENOUS | Status: DC

## 2012-05-02 MED ORDER — HYDROMORPHONE HCL PF 2 MG/ML IJ SOLN
INTRAMUSCULAR | Status: AC
  Filled 2012-05-02: qty 1

## 2012-05-02 MED ORDER — MIDAZOLAM HCL 2 MG/2ML IJ SOLN
INTRAMUSCULAR | Status: AC
  Filled 2012-05-02: qty 2

## 2012-05-02 MED ORDER — ATROPINE SULFATE 1 MG/ML IJ SOLN
INTRAMUSCULAR | Status: AC
  Filled 2012-05-02: qty 1

## 2012-05-02 MED ORDER — VERAPAMIL HCL 2.5 MG/ML IV SOLN
INTRAVENOUS | Status: AC
  Filled 2012-05-02: qty 2

## 2012-05-02 MED ORDER — HEPARIN SODIUM (PORCINE) 1000 UNIT/ML IJ SOLN
INTRAMUSCULAR | Status: AC
  Filled 2012-05-02: qty 1

## 2012-05-02 MED ORDER — ONDANSETRON HCL 4 MG/2ML IJ SOLN
INTRAMUSCULAR | Status: AC
  Filled 2012-05-02: qty 2

## 2012-05-02 MED ORDER — LIDOCAINE HCL (PF) 1 % IJ SOLN
INTRAMUSCULAR | Status: AC
  Filled 2012-05-02: qty 30

## 2012-05-02 NOTE — Interval H&P Note (Signed)
History and Physical Interval Note:  05/02/2012 6:38 AM  [name]  has presented today for surgery, with the diagnosis of Chest pain  The various methods of treatment have been discussed with the patient and family. After consideration of risks, benefits and other options for treatment, the patient has consented to  Procedure(s) (LRB) with comments: LEFT HEART CATHETERIZATION WITH CORONARY ANGIOGRAM (N/A) and possible angioplasty as a surgical intervention .  The patient's history has been reviewed, patient examined, no change in status, stable for surgery.  I have reviewed the patient's chart and labs.  Questions were answered to the patient's satisfaction.     Pamella Pert

## 2012-05-02 NOTE — Progress Notes (Signed)
05/02/2012 11:22 AM Nursing note  Upon return from cath lab pt. bp noted at 93/50 HR sinus brady 55. Dr. Jacinto Halim paged and made aware. Orders received and enacted. Will continue to closely monitor patient.  Geni Skorupski, Blanchard Kelch

## 2012-05-02 NOTE — Discharge Summary (Addendum)
Physician Discharge Summary  Patient ID: [name] MRN: [id number] DOB/AGE: 67-Sep-1947 67 y.o.  Admit date: 04/29/2012 Discharge date: 05/02/2012  Primary Discharge Diagnosis: Chest pain, very typical for unstable angina, abnormal EKG. Secondary Discharge Diagnosis Diet controlled diabetes mellitus 2. Hypertension 3. Obesity 4. Shortness of breath and dyspnea and exertion 5. Post procedure (cardiac catheterization) hypertension due to conscious sedation.  Significant Diagnostic Studies: Cardiac catheterization on 05/02/2012: Normal coronary arteries, codominant left and right coronary system. Circumflex coronary artery very large. No evidence of atherosclerotic coronary artery disease by cardiac catheterization. Normal left systolic function.  Consults:   Hospital Course: Patient presented to the emergency department with chest pain and an abnormal EKG. She was ruled out for myocardial infarction. Because of ongoing chest discomfort she was kept in the hospital. She was started on anticoagulation with subcutaneous Lovenox and nitro paste was applied to the patient. Since doing this, patient did not have any further chest discomfort. The following Monday, she underwent cardiac catheterization revealing normal coronary arteries. Hence patient was felt stable for discharge. However after the procedure, while patient was in the coronary area, patient continued to be hypotensive requiring fluid resuscitation. She was essentially asymptomatic. Hence we decided to keep her overnight and performed CBC and BMP which were all within normal limits without any evidence of blood loss. Patient's telemetry showed normal sinus rhythm. She remained stable the following morning and her right wrist site at arterial access, is without any hematoma. Patient was ready for discharge. I have advised her that she needs to make an appointment to see her GI doctor to evaluate for noncardiac causes of chest  pain.  Recommendation: She'll need aggressive risk modification. Continued primary prevention is indicated. Evaluation for noncardiac cause of chest pain is indicated.   Discharge Exam: Blood pressure 99/62, pulse 58, temperature 98 F (36.7 C), temperature source Oral, resp. rate 18, height 5\' 1"  (1.549 m), weight 97.251 kg (214 lb 6.4 oz), SpO2 99.00%.   General appearance: alert, cooperative and appears stated age  Eyes: conjunctivae/corneas clear. PERRL, EOM's intact. Fundi benign.  Neck: no adenopathy, no carotid bruit, no JVD, supple, symmetrical, trachea midline and thyroid not enlarged, symmetric, no tenderness/mass/nodules  Neck: JVP - normal, carotids 2+= without bruits  Resp: clear to auscultation bilaterally  Chest wall: no tenderness  Cardio: regular rate and rhythm, S1, S2 normal, no murmur, click, rub or gallop  GI: soft, non-tender; bowel sounds normal; no masses, no organomegaly and obese  Extremities: extremities normal, atraumatic, no cyanosis or edema  Pulses: 2+ and symmetric  Skin: Skin color, texture, turgor normal. No rashes or lesions  Neurologic: Alert and oriented X 3, normal strength and tone. Normal symmetric reflexes. Normal coordination and gait   Labs:   Lab Results  Component Value Date   WBC 5.7 05/02/2012   HGB 14.1 05/02/2012   HCT 43.0 05/02/2012   MCV 82.5 05/02/2012   PLT 173 05/02/2012    Lab 04/29/12 1959  NA 144  K 4.7  CL 105  CO2 30  BUN 12  CREATININE 1.04  CALCIUM 9.8  PROT 6.8  BILITOT 0.2*  ALKPHOS 76  ALT 10  AST 17  GLUCOSE 94   Lab Results  Component Value Date   TROPONINI <0.30 04/30/2012    Lipid Panel     Component Value Date/Time   CHOL 122 05/01/2012 0610   TRIG 76 05/01/2012 0610   HDL 47 05/01/2012 0610   CHOLHDL 2.6 05/01/2012 0610  VLDL 15 05/01/2012 0610   LDLCALC 60 05/01/2012 0610     Dg Chest 2 View  04/29/2012  *RADIOLOGY REPORT*  Clinical Data: Chest pain and shortness of breath  CHEST  - 2 VIEW  Comparison: 05/11/2011  Findings: The cardiac silhouette is normal in size and configuration.  No mediastinal or hilar mass or adenopathy. Postsurgical changes from left lung surgery including rib defects of the left fourth and fifth ribs are stable.  The lungs are clear. No pleural effusion or pneumothorax.  IMPRESSION: No acute cardiopulmonary disease.  No change from the prior study.   Original Report Authenticated By: Domenic Moras, M.D.     EKG: Normal sinus rhythm, normal axis, inferior and lateral T wave inversion suggestive of subendocardial myocardial infarction versus ischemia.  FOLLOW UP PLANS AND APPOINTMENTS Discharge Orders    Future Appointments: Provider: Department: Dept Phone: Center:   05/05/2012 3:15 PM [facility] [phone] None   05/20/2012 2:45 PM Waymon Budge, MD [facility] [phone] None       Medication List     As of 05/03/2012  9:43 AM    STOP taking these medications         clopidogrel 75 MG tablet   Commonly known as: PLAVIX      ezetimibe-simvastatin 10-40 MG per tablet   Commonly known as: VYTORIN      TAKE these medications         albuterol 108 (90 BASE) MCG/ACT inhaler   Commonly known as: PROVENTIL HFA;VENTOLIN HFA   Inhale 2 puffs into the lungs every 6 (six) hours as needed. For shortness of breath      ALPRAZolam 0.25 MG tablet   Commonly known as: XANAX   Take 0.25 mg by mouth at bedtime as needed. For sleep      aspirin 81 MG EC tablet   Take 1 tablet (81 mg total) by mouth daily.      diphenhydrAMINE 25 mg capsule   Commonly known as: BENADRYL   Take 25 mg by mouth daily as needed. For itching      fexofenadine 180 MG tablet   Commonly known as: ALLEGRA   Take 180 mg by mouth daily.      metoprolol tartrate 25 MG tablet   Commonly known as: LOPRESSOR   1/2 tablet twice daily      multivitamin with minerals Tabs   Take 1 tablet by mouth daily.      omeprazole 40 MG  capsule   Commonly known as: PRILOSEC   Take 1 capsule (40 mg total) by mouth daily.      pravastatin 10 MG tablet   Commonly known as: PRAVACHOL   Take 2 tablets (20 mg total) by mouth every evening.      SYSTANE FREE OP   Place 1 drop into both eyes daily as needed. For red eyes        Follow-up Information    Follow up with Pamella Pert, MD. Call in 4 weeks.   Contact information:   [address]  [place] [address] [phone]       Follow up with Shirley Friar., MD. Call in 1 week. (For evaluation of chest pain)    Contact information:   [address] [place] [place]nie Crum[place] [address] [phone]           Pamella Pert, MD 05/02/2012, 8:24 AM

## 2012-05-02 NOTE — Care Management Note (Unsigned)
    Page 1 of 1   05/02/2012     3:27:54 PM   CARE MANAGEMENT NOTE 05/02/2012  Patient:  [name],[name]   Account Number:  [id number]  Date Initiated:  05/02/2012  Documentation initiated by:  Javid Kemler  Subjective/Objective Assessment:   PT ADM ON 04/29/12 WITH UNSTABLE ANGINA PAIN.  PTA, PT INDEPENDENT, LIVES WITH FAMILY.     Action/Plan:   CATH PENDING TODAY.  LIKELY DC IF CATH NORMAL.  WILL FOLLOW FOR HOME NEEDS.   Anticipated DC Date:  05/02/2012   Anticipated DC Plan:  HOME/SELF CARE      DC Planning Services  CM consult      Choice offered to / List presented to:             Status of service:  In process, will continue to follow Medicare Important Message given?   (If response is "NO", the following Medicare IM given date fields will be blank) Date Medicare IM given:   Date Additional Medicare IM given:    Discharge Disposition:    Per UR Regulation:  Reviewed for med. necessity/level of care/duration of stay  If discussed at Long Length of Stay Meetings, dates discussed:    Comments:

## 2012-05-02 NOTE — CV Procedure (Signed)
Procedure performed:  Left heart catheterization including hemodynamic monitoring of the left ventricle, left ventriculography, selective right and left coronary arteriography.  Indication patient is a 66 year-old woman with history of hypertension,  hyperlipidemia, diet controlled  Diabetes Mellitus   who presents with chest pain suggestive of unstable angina pectoris with abnormal EKG.  Hence is brought to the cardiac catheterization lab to evaluate her coronary anatomy for definitive diagnosis of CAD.  Hemodynamic data:  Left ventricular pressure was 83/8 with LVEDP of 11 mm mercury. Aortic pressure was 83/55 with a mean of 69 mm mercury. No pressure gradient across the aortic valve  Left ventricle: Performed in the RAO projection revealed LVEF of 55-60 There was No MR. No Wall motion abnormality.  Right coronary artery: The vessel is smooth, normal, Very small with a very tiny PD branch. It is Co- Dominant. Normal  Left main coronary artery is large and normal.  Circumflex coronary artery: A large vessel giving origin to a large obtuse marginal 1. It is dominant. Normal  LAD:  LAD gives origin to a large diagonal-1. LAD Ends before reaching the apex. Normal  Ramus intermediate: It is a very large branch. It is smooth and normal.  Impression: Normal coronary arteries left and right codominant system, right coronary artery is extremely small. No angiographic evidence of coronary artery disease. Evaluation for noncardiac causes of chest pain is indicated. I suspect the markedly abnormal EKG is probably due to hypertension.  Technique: Under sterile precautions using a 6 French right radial  arterial access, a 6 French sheath was introduced into the right radial artery. A 5 [place] Tig 4 catheter was advanced into the ascending aorta selective  right coronary artery and left coronary artery was cannulated and angiography was performed in multiple views. The catheter was pulled back Out of the  body over exchange length J-wire. 50 cc of contrast was utilized for diagnostic the  Sam Catheter was used to perform LV gram which was performed in LAO projection.  Catheter exchanged out of the body over J-Wire. NO immediate complications noted. Patient tolerated the procedure well.   Rec: Medical therapy with aggressive risk factor reduction.   Disposition: Will be discharged home today with outpatient follow up.

## 2012-05-02 NOTE — H&P (View-Only) (Signed)
Subjective:  Patient presently not having any chest discomfort since starting nitroglycerin paste and subcutaneous Lovenox. Slept well no new symptomatology. Objective:  Vital Signs in the last 24 hours: Temp:  [98 F (36.7 C)-98.3 F (36.8 C)] 98.3 F (36.8 C) (10/13 1705) Pulse Rate:  [57-102] 67  (10/13 1705) Resp:  [16-18] 16  (10/13 1705) BP: (98-111)/(51-64) 111/64 mmHg (10/13 1705) SpO2:  [95 %-99 %] 99 % (10/13 1705)  Intake/Output from previous day: 10/12 0701 - 10/13 0700 In: 720 [P.O.:720] Out: -   Physical Exam:  General appearance: alert, cooperative and appears stated age  Eyes: conjunctivae/corneas clear. PERRL, EOM's intact. Fundi benign.  Neck: no adenopathy, no carotid bruit, no JVD, supple, symmetrical, trachea midline and thyroid not enlarged, symmetric, no tenderness/mass/nodules  Neck: JVP - normal, carotids 2+= without bruits  Resp: clear to auscultation bilaterally  Chest wall: no tenderness  Cardio: regular rate and rhythm, S1, S2 normal, no murmur, click, rub or gallop  GI: soft, non-tender; bowel sounds normal; no masses, no organomegaly and obese  Extremities: extremities normal, atraumatic, no cyanosis or edema  Pulses: 2+ and symmetric  Skin: Skin color, texture, turgor normal. No rashes or lesions  Neurologic: Alert and oriented X 3, normal strength and tone. Normal symmetric reflexes. Normal coordination and gait       Lab Results:  Basename 05/01/12 0610 04/29/12 1959  WBC 5.3 6.5  HGB 14.3 14.6  PLT 180 200    Basename 04/29/12 1959  NA 144  K 4.7  CL 105  CO2 30  GLUCOSE 94  BUN 12  CREATININE 1.04    Basename 04/30/12 1018 04/30/12 0605  TROPONINI <0.30 <0.30   Hepatic Function Panel  Basename 04/29/12 1959  PROT 6.8  ALBUMIN 3.8  AST 17  ALT 10  ALKPHOS 76  BILITOT 0.2*  BILIDIR --  IBILI --   Lipid Panel     Component Value Date/Time   CHOL 122 05/01/2012 0610   TRIG 76 05/01/2012 0610   HDL 47 05/01/2012  0610   CHOLHDL 2.6 05/01/2012 0610   VLDL 15 05/01/2012 0610   LDLCALC 60 05/01/2012 0610      Assessment/Plan:  1. Chest pain suggestive of unstable angina pectoris  2. Abnormal EKG. EKG performed on 04/29/2012 and 04/30/2012 and 04/21/12:  revealed normal sinus rhythm, normal axis, inferior and lateral deep T wave inversion suggestive of inferolateral ischemia versus subendocardial myocardial infarction. QT is mildly prolonged.  3. Obesity, moderate. History of bariatric surgery in the remote past.  4. Hypertension  5. Hyperlipidemia  6. Diabetes mellitus type 2 controlled, diet  Recommendation: As patient remained stable without any further chest discomfort, we will continue to observe her. Patient is scheduled for cardiac catheterization in the morning. All questions were answered. Patient is alreadyseen the video  for the cardiac catheterization.  Pamella Pert, M.D. 05/01/2012, 5:27 PM

## 2012-05-03 MED ORDER — ASPIRIN 81 MG PO TBEC: 81.0000 mg | DELAYED_RELEASE_TABLET | Freq: Every day | ORAL | Status: DC

## 2012-05-03 MED ORDER — OMEPRAZOLE 40 MG PO CPDR: 40.0000 mg | DELAYED_RELEASE_CAPSULE | Freq: Every day | ORAL | Status: DC

## 2012-05-03 MED ORDER — METOPROLOL TARTRATE 25 MG PO TABS: ORAL_TABLET | ORAL | Status: DC

## 2012-05-03 MED ORDER — PRAVASTATIN SODIUM 10 MG PO TABS: 20.0000 mg | ORAL_TABLET | Freq: Every evening | ORAL | Status: DC

## 2012-05-03 NOTE — Plan of Care (Signed)
Problem: Phase III Progression Outcomes Goal: Vascular site scale level 0 - I Vascular Site Scale Level 0: No bruising/bleeding/hematoma Level I (Mild): Bruising/Ecchymosis, minimal bleeding/ooozing, palpable hematoma < 3 cm Level II (Moderate): Bleeding not affecting hemodynamic parameters, pseudoaneurysm, palpable hematoma > 3 cm  Outcome: Completed/Met Date Met:  05/03/12 Level 0

## 2012-05-05 ENCOUNTER — Ambulatory Visit (INDEPENDENT_AMBULATORY_CARE_PROVIDER_SITE_OTHER): Payer: Medicare Other | Admitting: Physician Assistant

## 2012-05-05 ENCOUNTER — Encounter (INDEPENDENT_AMBULATORY_CARE_PROVIDER_SITE_OTHER): Payer: Self-pay

## 2012-05-05 VITALS — BP 144/84 | HR 60 | Ht 60.25 in | Wt 213.2 lb

## 2012-05-05 DIAGNOSIS — Z4651 Encounter for fitting and adjustment of gastric lap band: Secondary | ICD-10-CM

## 2012-05-05 NOTE — Progress Notes (Signed)
  HISTORY: Mashayla Dilone Hannen is a 66 y.o.female who received an AP-Standard lap-band in February 2010 by Dr. Ezzard Standing. She complains of persistent solid food dysphagia and chest discomfort. She says she was recently discharged from the hospital for a negative chest pain workup including a cardiac cath. She is supposed to follow-up with GI as well.  VITAL SIGNS: Filed Vitals:   05/05/12 1504  BP: 144/84  Pulse: 60    PHYSICAL EXAM: Physical exam reveals a very well-appearing 66 y.o.female in no apparent distress Neurologic: Awake, alert, oriented Psych: Bright affect, conversant Respiratory: Breathing even and unlabored. No stridor or wheezing Abdomen: Soft, nontender, nondistended to palpation. Incisions well-healed. No incisional hernias. Port easily palpated. Extremities: Atraumatic, good range of motion.  ASSESMENT: 66 y.o.  female  s/p AP-Standard lap-band.   PLAN: The patient's port was accessed with a 20G Huber needle without difficulty. Clear fluid was aspirated and 2 mL saline was removed from the port after which she was able to swallow water with much greater ease. She is prescribed Omeprazole, which I asked that she continue. We'll have her back in 3 weeks for re-evaluation.

## 2012-05-05 NOTE — Patient Instructions (Addendum)
Return in 3 weeks. Focus on good food choices as well as physical activity. Return sooner if you have an increase in hunger, portion sizes or weight. Return also for difficulty swallowing, night cough, reflux. Take the omeprazole as prescribed.

## 2012-05-20 ENCOUNTER — Ambulatory Visit: Payer: Medicare Other | Admitting: Internal Medicine

## 2012-05-24 ENCOUNTER — Encounter: Payer: Self-pay | Admitting: Internal Medicine

## 2012-05-24 ENCOUNTER — Ambulatory Visit (INDEPENDENT_AMBULATORY_CARE_PROVIDER_SITE_OTHER): Payer: Medicare Other | Admitting: Internal Medicine

## 2012-05-24 ENCOUNTER — Encounter: Payer: Self-pay | Admitting: *Deleted

## 2012-05-24 VITALS — BP 110/62 | HR 56 | Ht 62.0 in | Wt 223.0 lb

## 2012-05-24 DIAGNOSIS — Z23 Encounter for immunization: Secondary | ICD-10-CM

## 2012-05-24 DIAGNOSIS — J4489 Other specified chronic obstructive pulmonary disease: Secondary | ICD-10-CM

## 2012-05-24 DIAGNOSIS — J449 Chronic obstructive pulmonary disease, unspecified: Secondary | ICD-10-CM

## 2012-05-24 MED ORDER — ALBUTEROL SULFATE HFA 108 (90 BASE) MCG/ACT IN AERS: 2.0000 | INHALATION_SPRAY | Freq: Four times a day (QID) | RESPIRATORY_TRACT | Status: DC | PRN

## 2012-05-24 NOTE — Progress Notes (Signed)
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 Last here- 06/27/10 She feels "80% better" since last here. Has been able to lose a little weight which helped. No more hemoptysis. Using pro-air rescue inhaler only if she exerts herself a lot and continues maintenance Spiriva. No cough or recent wheeze. CXR 06/27/2010-NAD, clear.  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 States that breathing has not been good lately. lots of sob, wheezing and cough. feels fatigued all the time. Little effect from her rescue inhaler. Easy dyspnea walking across parking lot. Post hospital visit-04/29/2012 2 05/02/2012 by Dr.Ganji/ cardiology for unstable angina. Clear coronary arteries on cath. Was not anemic. Reports increased sharp pains in chest. Has felt better since fluid was removed from her lap band. COPD assessment test (CABG) score 22/40 PFT 07/05/2008-moderate obstructive airways disease with response to bronchodilator, diffusion moderately reduced. FEV1 1.75/87%, FEV1/FVC 0.53, DLCO 49% CXR 04/29/12-reviewed IMPRESSION:  No acute cardiopulmonary disease. No change from the prior study.  Original Report Authenticated By: Domenic Moras, M.D.   ROS-see HPI Constitutional:   No-   weight loss, night sweats, fevers, chills,+ fatigue, lassitude. HEENT:   No-  headaches, difficulty swallowing, tooth/dental problems, sore throat,       No-  sneezing, itching, ear ache, nasal congestion, post nasal drip,  CV:  No-   chest pain, orthopnea, PND, swelling in lower extremities, anasarca, dizziness, palpitations Resp: +  shortness of breath with exertion or at rest.              No-   productive cough,  + non-productive cough,  No- coughing up of blood.              No-   change in color of mucus. +wheezing.   Skin: No-   rash or lesions. GI:   No-   heartburn, indigestion, abdominal pain, nausea, vomiting, GU: . MS:  No-   joint pain or swelling.   Neuro-     nothing unusual Psych:  No- change in mood or affect. No depression or anxiety.  No memory loss.  OBJ General- Alert, Oriented, Affect-appropriate, Distress- none acute, +obese Skin- rash-none, lesions- none, excoriation- none Lymphadenopathy- none Head- atraumatic            Eyes- Gross vision intact, PERRLA, conjunctivae clear secretions            Ears- Hearing, canals-normal            Nose- Clear, no-Septal dev, mucus, polyps, erosion, perforation             Throat- Mallampati II , mucosa clear , drainage- none, tonsils- atrophic Neck- flexible , trachea midline, no stridor , thyroid nl, carotid no bruit Chest - symmetrical excursion , unlabored           Heart/CV- RRR , no murmur , no gallop  , no rub, nl s1 s2                           - JVD- none , edema- none, stasis changes- none, varices- none           Lung- clear to P&A, wheeze- none, cough- none , dullness-none, rub- none           Chest wall- scar left chest Port-A-Cath Abd-  Br/  Gen/ Rectal- Not done, not indicated Extrem- cyanosis- none, clubbing, none, atrophy- none, strength- nl Neuro- grossly intact to observation

## 2012-05-24 NOTE — Patient Instructions (Addendum)
Refill script sent for rescue inhaler  Flu vax  Pace yourself,  But try to walk regularly so you can build some stamina

## 2012-05-25 ENCOUNTER — Encounter: Payer: Self-pay | Admitting: *Deleted

## 2012-05-25 ENCOUNTER — Telehealth: Payer: Self-pay | Admitting: Internal Medicine

## 2012-05-25 NOTE — Telephone Encounter (Signed)
LMTCB

## 2012-05-26 ENCOUNTER — Encounter (INDEPENDENT_AMBULATORY_CARE_PROVIDER_SITE_OTHER): Payer: Medicare Other

## 2012-05-27 NOTE — Telephone Encounter (Signed)
I spoke with pt and she stated her rescue inhaler was going to cost her $65 and she can't afford that. I called the pharmacy and was advised she will need to call her insurance to see what brand name RX for the albuterol that they will cover cheaper. I called and made pt aware of this. She stated she will call us back and let us know once she finds out

## 2012-06-05 MED ORDER — ALBUTEROL SULFATE (2.5 MG/3ML) 0.083% IN NEBU: 2.5000 mg | INHALATION_SOLUTION | Freq: Four times a day (QID) | RESPIRATORY_TRACT | Status: DC | PRN

## 2012-06-05 NOTE — Assessment & Plan Note (Signed)
She is noticing much effect from her rescue inhaler. It helped reduce fluid volume in her lap band. Some of the problem may be weight gain/obesity.

## 2012-06-23 ENCOUNTER — Ambulatory Visit (INDEPENDENT_AMBULATORY_CARE_PROVIDER_SITE_OTHER): Payer: Medicare Other | Admitting: Physician Assistant

## 2012-06-23 ENCOUNTER — Encounter (INDEPENDENT_AMBULATORY_CARE_PROVIDER_SITE_OTHER): Payer: Medicare Other

## 2012-06-23 ENCOUNTER — Encounter (INDEPENDENT_AMBULATORY_CARE_PROVIDER_SITE_OTHER): Payer: Self-pay

## 2012-06-23 VITALS — BP 136/78 | HR 82 | Temp 98.0°F | Resp 18 | Ht 60.25 in | Wt 232.0 lb

## 2012-06-23 DIAGNOSIS — Z4651 Encounter for fitting and adjustment of gastric lap band: Secondary | ICD-10-CM

## 2012-06-23 NOTE — Progress Notes (Signed)
  HISTORY: [name] is a 66 y.o.female who received an AP-Standard lap-band in February 2010 by Dr. Ezzard Standing. She comes in with no further symptoms of chest tightness for which a cardiac workup has returned normal per her history. She believes this was secondary to stress from taking a full academic courseload. Since reducing to part-time, she feels much better but she's gained 19 lbs since fluid was removed. She has no regurgitation or reflux but she complains of hunger and large portion sizes.  VITAL SIGNS: Filed Vitals:   06/23/12 1433  BP: 136/78  Pulse: 82  Temp: 98 F (36.7 C)  Resp: 18    PHYSICAL EXAM: Physical exam reveals a very well-appearing 66 y.o.female in no apparent distress Neurologic: Awake, alert, oriented Psych: Bright affect, conversant Respiratory: Breathing even and unlabored. No stridor or wheezing Abdomen: Soft, nontender, nondistended to palpation. Incisions well-healed. No incisional hernias. Port easily palpated. Extremities: Atraumatic, good range of motion.  ASSESMENT: 66 y.o.  female  s/p AP-Standard lap-band.   PLAN: The patient's port was accessed with a 20G Huber needle without difficulty. Clear fluid was aspirated and 1.5 mL saline was added to the port. The patient was able to swallow water without difficulty following the procedure and was instructed to take clear liquids for the next 24-48 hours and advance slowly as tolerated.

## 2012-06-23 NOTE — Patient Instructions (Signed)
Take clear liquids tonight. Thin protein shakes are ok to start tomorrow morning. Slowly advance your diet thereafter. Call us if you have persistent vomiting or regurgitation, night cough or reflux symptoms. Return as scheduled or sooner if you notice no changes in hunger/portion sizes.  

## 2012-07-18 ENCOUNTER — Telehealth: Payer: Self-pay | Admitting: Internal Medicine

## 2012-07-18 MED ORDER — HYDROCODONE-HOMATROPINE 5-1.5 MG/5ML PO SYRP: 5.0000 mL | ORAL_SOLUTION | Freq: Four times a day (QID) | ORAL | Status: DC | PRN

## 2012-07-18 NOTE — Telephone Encounter (Signed)
Called spoke with patient, advised CY okayed for refill on her hydromet.  Pt verbalized her understanding.  Rx telephoned to verified pharmacy to pharmacist Hessie Diener.  Nothing further needed; will sign off.

## 2012-07-18 NOTE — Telephone Encounter (Signed)
Per CY - Hydromet 1 tsp Q 6hrs prn cough

## 2012-07-18 NOTE — Telephone Encounter (Signed)
Spoke with pt  She is c/o cough x 1 wk-prod with white to grey sputum She states not having any increased SOB, CP/chest tightness, f/c/s Would like hydrocodone cough syrup called in Last ov 05/24/12 Next ov 11/25/12 No Known Allergies

## 2012-07-22 ENCOUNTER — Ambulatory Visit (INDEPENDENT_AMBULATORY_CARE_PROVIDER_SITE_OTHER): Payer: Medicare Other | Admitting: Internal Medicine

## 2012-07-22 ENCOUNTER — Encounter: Payer: Self-pay | Admitting: Internal Medicine

## 2012-07-22 ENCOUNTER — Other Ambulatory Visit: Payer: Medicare Other

## 2012-07-22 VITALS — BP 110/68 | HR 98 | Ht 62.0 in | Wt 234.0 lb

## 2012-07-22 DIAGNOSIS — R059 Cough, unspecified: Secondary | ICD-10-CM

## 2012-07-22 DIAGNOSIS — J449 Chronic obstructive pulmonary disease, unspecified: Secondary | ICD-10-CM

## 2012-07-22 DIAGNOSIS — R509 Fever, unspecified: Secondary | ICD-10-CM

## 2012-07-22 DIAGNOSIS — R05 Cough: Secondary | ICD-10-CM

## 2012-07-22 LAB — INFLUENZA A AND B

## 2012-07-22 MED ORDER — AZITHROMYCIN 250 MG PO TABS: ORAL_TABLET | ORAL | Status: DC

## 2012-07-22 MED ORDER — HYDROCOD POLST-CHLORPHEN POLST 10-8 MG/5ML PO LQCR: 5.0000 mL | Freq: Two times a day (BID) | ORAL | Status: DC | PRN

## 2012-07-22 NOTE — Progress Notes (Signed)
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 Last here- 06/27/10 She feels "80% better" since last here. Has been able to lose a little weight which helped. No more hemoptysis. Using pro-air rescue inhaler only if she exerts herself a lot and continues maintenance Spiriva. No cough or recent wheeze. CXR 06/27/2010-NAD, clear.  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 States that breathing has not been good lately. lots of sob, wheezing and cough. feels fatigued all the time. Little effect from her rescue inhaler. Easy dyspnea walking across parking lot. Post hospital visit-04/29/2012 2 05/02/2012 by Dr.Ganji/ cardiology for unstable angina. Clear coronary arteries on cath. Was not anemic. Reports increased sharp pains in chest. Has felt better since fluid was removed from her lap band. COPD assessment test (CABG) score 22/40 PFT 07/05/2008-moderate obstructive airways disease with response to bronchodilator, diffusion moderately reduced. FEV1 1.75/87%, FEV1/FVC 0.53, DLCO 49% CXR 04/29/12-reviewed IMPRESSION:  No acute cardiopulmonary disease. No change from the prior study.  Original Report Authenticated By: Domenic Moras, M.D.   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 FOLLOWS FOR: Acute illness-this fever 100.6 highest; cough-productive dirty yellow in color, SOB, wheezing. Using neb tx(helps slightly and rescue inhaler, as well as Tussionex) Hydromet cough syrup is ineffective. Now fifth day of acute illness as described above. Steadily progressive aching. Queasy stomach with diarrhea  ROS-see HPI Constitutional:   No-   weight loss, night sweats, +fevers, chills,+ fatigue, lassitude. HEENT:   No-  headaches, difficulty  swallowing, tooth/dental problems, sore throat,       No-  sneezing, itching, ear ache, +nasal congestion, post nasal drip,  CV:  No-   chest pain, orthopnea, PND, swelling in lower extremities, anasarca, dizziness, palpitations Resp: +  shortness of breath with exertion or at rest.              No-   productive cough,  + non-productive cough,  No- coughing up of blood.              No-   change in color of mucus. +wheezing.   Skin: No-   rash or lesions. GI:  No-   heartburn, indigestion, abdominal pain, nausea, vomiting, GU: . MS:  No-   joint pain or swelling.   Neuro-     nothing unusual Psych:  No- change in mood or affect. No depression or anxiety.  No memory loss.  OBJ General- Alert, Oriented, Affect-appropriate, Distress- none acute, +obese Skin- rash-none, lesions- none, excoriation- none. Skin is warm to touch+ Lymphadenopathy- none Head- atraumatic            Eyes- Gross vision intact, PERRLA, conjunctivae clear secretions            Ears- Hearing, canals-normal            Nose- Clear, no-Septal dev, mucus, polyps, erosion, perforation             Throat- Mallampati II , mucosa + red , drainage- none, tonsils- atrophic Neck- flexible , trachea midline, no stridor , thyroid nl, carotid no bruit Chest - symmetrical excursion , unlabored           Heart/CV- RRR , no murmur , no gallop  , no rub, nl s1 s2                           -  JVD- none , edema- none, stasis changes- none, varices- none           Lung- clear to P&A, wheeze- none,+ dry cough , dullness-none, rub- none           Chest wall- scar left chest Port-A-Cath Abd-  Br/ Gen/ Rectal- Not done, not indicated Extrem- cyanosis- none, clubbing, none, atrophy- none, strength- nl Neuro- grossly intact to observation

## 2012-07-22 NOTE — Patient Instructions (Addendum)
Scripts for cough syrup and for antibiotic  Flu swab  Fluids, rest, stay warm.

## 2012-07-25 ENCOUNTER — Telehealth: Payer: Self-pay | Admitting: Internal Medicine

## 2012-07-25 NOTE — Telephone Encounter (Signed)
Spoke with patient informed her of results/recs as listed below per Dr. Maple Hudson. Patient verbalized understanding and nothing further needed at this time.  Result Note     Influenza swab was negative

## 2012-07-25 NOTE — Progress Notes (Signed)
Quick Note:  Spoke with patientl, informed her of results as listed below. Patient verbalized understanding and nothing further needed at this time. ______

## 2012-07-26 ENCOUNTER — Telehealth: Payer: Self-pay | Admitting: Pulmonary Disease

## 2012-07-26 MED ORDER — FLUCONAZOLE 150 MG PO TABS: 150.0000 mg | ORAL_TABLET | Freq: Once | ORAL | Status: DC

## 2012-07-26 NOTE — Telephone Encounter (Signed)
Pt states that she has finished the antibiotic that CY gave her and now she thinks she has thrush in her mouth. She wants to know CY recommendations.  Please advise.

## 2012-07-26 NOTE — Telephone Encounter (Signed)
Diflucan 150mg  #3 1 daily for 3 days.  Pt is aware that the prescription has been sent in.

## 2012-08-02 NOTE — Assessment & Plan Note (Signed)
Acute exacerbation with infection/bronchitis. Possibly flu although there is more GI upset unusual for that disease Plan-fluids, Z-Pak, prescription for Tamiflu to hold. Tussionex. Nasal swab assay for influenza

## 2012-08-04 ENCOUNTER — Encounter (INDEPENDENT_AMBULATORY_CARE_PROVIDER_SITE_OTHER): Payer: Self-pay

## 2012-08-04 ENCOUNTER — Ambulatory Visit (INDEPENDENT_AMBULATORY_CARE_PROVIDER_SITE_OTHER): Payer: Medicare Other | Admitting: Physician Assistant

## 2012-08-04 ENCOUNTER — Encounter (INDEPENDENT_AMBULATORY_CARE_PROVIDER_SITE_OTHER): Payer: Medicare Other

## 2012-08-04 VITALS — BP 120/68 | HR 88 | Temp 97.0°F | Ht 60.25 in | Wt 228.6 lb

## 2012-08-04 DIAGNOSIS — Z4651 Encounter for fitting and adjustment of gastric lap band: Secondary | ICD-10-CM

## 2012-08-04 NOTE — Patient Instructions (Signed)
Take clear liquids tonight. Thin protein shakes are ok to start tomorrow morning. Slowly advance your diet thereafter. Call us if you have persistent vomiting or regurgitation, night cough or reflux symptoms. Return as scheduled or sooner if you notice no changes in hunger/portion sizes.  

## 2012-08-04 NOTE — Progress Notes (Signed)
  HISTORY: Shantaya Bluestone Vanes is a 67 y.o.female who received an AP-Standard lap-band in February 2010 by Dr. Ezzard Standing. She comes in with continued hunger and larger than desired portion sizes but fortunately she's lost almost 4 lbs. She denies regurgitation or reflux. She'd like a fill today.  VITAL SIGNS: Filed Vitals:   08/04/12 1354  BP: 120/68  Pulse: 88  Temp: 97 F (36.1 C)    PHYSICAL EXAM: Physical exam reveals a very well-appearing 67 y.o.female in no apparent distress Neurologic: Awake, alert, oriented Psych: Bright affect, conversant Respiratory: Breathing even and unlabored. No stridor or wheezing Abdomen: Soft, nontender, nondistended to palpation. Incisions well-healed. No incisional hernias. Port easily palpated. Extremities: Atraumatic, good range of motion.  ASSESMENT: 67 y.o.  female  s/p AP-Standard lap-band.   PLAN: The patient's port was accessed with a 20G Huber needle without difficulty. Clear fluid was aspirated and 0.5 mL saline was added to the port. The patient was able to swallow water without difficulty following the procedure and was instructed to take clear liquids for the next 24-48 hours and advance slowly as tolerated.

## 2012-08-24 ENCOUNTER — Telehealth: Payer: Self-pay | Admitting: Internal Medicine

## 2012-08-24 MED ORDER — HYDROCOD POLST-CHLORPHEN POLST 10-8 MG/5ML PO LQCR: 5.0000 mL | Freq: Two times a day (BID) | ORAL | Status: DC | PRN

## 2012-08-24 NOTE — Telephone Encounter (Signed)
Ok to refill tussionex 

## 2012-08-24 NOTE — Telephone Encounter (Signed)
CY-please advise if okay to refill. Thanks.  

## 2012-09-09 ENCOUNTER — Telehealth: Payer: Self-pay | Admitting: Internal Medicine

## 2012-09-09 NOTE — Telephone Encounter (Signed)
Last ov 07/22/12. Pending OV 11/25/12. Pt c/o PND, sore/scratchy throat, prod cough with clear to yellow mucus that is worse at night. She denies any fever, sob or chest tightness. She is taking Benadryl QHS and Claritin daily for allergies. CY, pls advise of any recs.No Known Allergies

## 2012-09-09 NOTE — Telephone Encounter (Signed)
lmomtcb x1 for pt 

## 2012-09-09 NOTE — Telephone Encounter (Signed)
Per CY-try Zpak if not allergic and Mucinex

## 2012-09-12 ENCOUNTER — Telehealth: Payer: Self-pay | Admitting: *Deleted

## 2012-09-12 MED ORDER — AZITHROMYCIN 250 MG PO TABS: ORAL_TABLET | ORAL | Status: DC

## 2012-09-12 NOTE — Telephone Encounter (Signed)
LMTCBx1 I went ahead and sent zpak to pharmacy because med list shows pt has had this in the past. Need to advise the pt. Carron Curie, CMA

## 2012-09-12 NOTE — Telephone Encounter (Signed)
Pt aware that Rx has been called into pharmacy.

## 2012-09-13 NOTE — Telephone Encounter (Signed)
LMTCB x 1 

## 2012-09-13 NOTE — Telephone Encounter (Signed)
Pt informed that rx for Zpak was sent to pharmacy.

## 2012-11-03 ENCOUNTER — Encounter (INDEPENDENT_AMBULATORY_CARE_PROVIDER_SITE_OTHER): Payer: MEDICARE

## 2012-11-25 ENCOUNTER — Encounter: Payer: Self-pay | Admitting: Internal Medicine

## 2012-11-25 ENCOUNTER — Ambulatory Visit (INDEPENDENT_AMBULATORY_CARE_PROVIDER_SITE_OTHER): Payer: Medicare Other | Admitting: Internal Medicine

## 2012-11-25 VITALS — BP 122/82 | HR 58 | Ht 62.0 in | Wt 228.0 lb

## 2012-11-25 DIAGNOSIS — Z85118 Personal history of other malignant neoplasm of bronchus and lung: Secondary | ICD-10-CM

## 2012-11-25 DIAGNOSIS — J449 Chronic obstructive pulmonary disease, unspecified: Secondary | ICD-10-CM

## 2012-11-25 NOTE — Patient Instructions (Addendum)
Handicapped application- done  Please call as needed

## 2012-11-25 NOTE — Progress Notes (Signed)
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 Last here- 06/27/10 She feels "80% better" since last here. Has been able to lose a little weight which helped. No more hemoptysis. Using pro-air rescue inhaler only if she exerts herself a lot and continues maintenance Spiriva. No cough or recent wheeze. CXR 06/27/2010-NAD, clear.  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 States that breathing has not been good lately. lots of sob, wheezing and cough. feels fatigued all the time. Little effect from her rescue inhaler. Easy dyspnea walking across parking lot. Post hospital visit-04/29/2012 2 05/02/2012 by Dr.Ganji/ cardiology for unstable angina. Clear coronary arteries on cath. Was not anemic. Reports increased sharp pains in chest. Has felt better since fluid was removed from her lap band. COPD assessment test (CABG) score 22/40 PFT 07/05/2008-moderate obstructive airways disease with response to bronchodilator, diffusion moderately reduced. FEV1 1.75/87%, FEV1/FVC 0.53, DLCO 49% CXR 04/29/12-reviewed IMPRESSION:  No acute cardiopulmonary disease. No change from the prior study.  Original Report Authenticated By: Domenic Moras, M.D.   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 FOLLOWS FOR: Acute illness-this fever 100.6 highest; cough-productive dirty yellow in color, SOB, wheezing. Using neb tx(helps slightly and rescue inhaler, as well as Tussionex) Hydromet cough syrup is ineffective. Now fifth day of acute illness as described above. Steadily progressive aching. 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 Reports DOE, dry cough and wheezing. Denies chest pain or chest tightness.  No acute event. CXR 10/ 11 /13 IMPRESSION:  No acute cardiopulmonary disease. No change from the prior study.  Original Report Authenticated By: Domenic Moras, M.D.is COPD Assessment Test- 11/25/12    19 ROS-see HPI Constitutional:   No-   weight loss, night sweats, +fevers, chills,+ fatigue, lassitude. HEENT:   No-  headaches, difficulty swallowing, tooth/dental problems, sore throat,       No-  sneezing, itching, ear ache, +nasal congestion, post nasal drip,  CV:  No-   chest pain, orthopnea, PND, swelling in lower extremities, anasarca, dizziness, palpitations Resp: +  shortness of breath with exertion or at rest.              No-   productive cough,  + non-productive cough,  No- coughing up of blood.              No-   change in color of mucus. +wheezing.   Skin: No-   rash or lesions. GI:  No-   heartburn, indigestion, abdominal pain, nausea, vomiting, GU: . MS:  No-   joint pain or swelling.   Neuro-     nothing unusual Psych:  No- change in mood or affect. No depression or anxiety.  No memory loss.  OBJ General- Alert, Oriented, Affect-appropriate, Distress- none acute, +obese Skin- rash-none, lesions- none, excoriation- none. Skin is warm to touch+ Lymphadenopathy- none Head- atraumatic            Eyes- Gross vision intact, PERRLA, conjunctivae clear secretions            Ears- Hearing, canals-normal            Nose- Clear, no-Septal dev, mucus, polyps,  erosion, perforation             Throat- Mallampati II , mucosa + red , drainage- none, tonsils- atrophic Neck- flexible , trachea midline, no stridor , thyroid nl, carotid no bruit Chest - symmetrical excursion , unlabored           Heart/CV- RRR , no murmur , no gallop  , no rub, nl s1 s2                           - JVD- none , edema- none, stasis changes- none, varices- none           Lung- clear to P&A, wheeze- none,+  dry cough , dullness-none, rub- none           Chest wall- scar left chest Port-A-Cath Abd-  Br/ Gen/ Rectal- Not done, not indicated Extrem- cyanosis- none, clubbing, none, atrophy- none, strength- nl Neuro- grossly intact to observation

## 2012-12-07 NOTE — Assessment & Plan Note (Signed)
No known recurrence 

## 2012-12-07 NOTE — Assessment & Plan Note (Addendum)
COPD, history of l lobectomy. Mild seasonal exacerbation. Plan- handicapped parking, medication talk, encourage walking for stamina

## 2013-02-27 ENCOUNTER — Other Ambulatory Visit (HOSPITAL_COMMUNITY): Payer: Self-pay | Admitting: Family Medicine

## 2013-02-27 DIAGNOSIS — Z1231 Encounter for screening mammogram for malignant neoplasm of breast: Secondary | ICD-10-CM

## 2013-03-01 ENCOUNTER — Ambulatory Visit (HOSPITAL_COMMUNITY)
Admission: RE | Admit: 2013-03-01 | Discharge: 2013-03-01 | Disposition: A | Discharge status: Home / Self Care | Payer: Medicare Other | Source: Ambulatory Visit | Attending: Family Medicine | Admitting: Family Medicine

## 2013-03-01 DIAGNOSIS — Z1231 Encounter for screening mammogram for malignant neoplasm of breast: Secondary | ICD-10-CM | POA: Insufficient documentation

## 2013-03-23 ENCOUNTER — Encounter (INDEPENDENT_AMBULATORY_CARE_PROVIDER_SITE_OTHER): Payer: Self-pay

## 2013-03-23 ENCOUNTER — Ambulatory Visit (INDEPENDENT_AMBULATORY_CARE_PROVIDER_SITE_OTHER): Payer: Medicare Other | Admitting: Physician Assistant

## 2013-03-23 VITALS — BP 140/90 | HR 78 | Resp 20 | Ht 60.5 in | Wt 232.4 lb

## 2013-03-23 DIAGNOSIS — Z4651 Encounter for fitting and adjustment of gastric lap band: Secondary | ICD-10-CM

## 2013-03-23 NOTE — Patient Instructions (Signed)

## 2013-03-23 NOTE — Progress Notes (Signed)
  HISTORY: [name] is a 67 y.o.female who received an AP-Standard lap-band in February 2010 by Dr. Ezzard Standing. She comes in with close to 4 lbs weight gain since January of this year. She denies regurgitation or reflux symptoms but does have increased hunger and portions. She is still battling sweets and ice cream as sources of calories. She complains of musculoskeletal pain that limits her activity.  VITAL SIGNS: Filed Vitals:   03/23/13 0951  BP: 140/90  Pulse: 78  Resp: 20    PHYSICAL EXAM: Physical exam reveals a very well-appearing 67 y.o.female in no apparent distress Neurologic: Awake, alert, oriented Psych: Bright affect, conversant Respiratory: Breathing even and unlabored. No stridor or wheezing Abdomen: Soft, nontender, nondistended to palpation. Incisions well-healed. No incisional hernias. Port easily palpated. Extremities: Atraumatic, good range of motion.  ASSESMENT: 67 y.o.  female  s/p AP-Standard lap-band.   PLAN: The patient's port was accessed with a 20G Huber needle without difficulty. Clear fluid was aspirated and 0.5 mL saline was added to the port. The patient was able to swallow water without difficulty following the procedure and was instructed to take clear liquids for the next 24-48 hours and advance slowly as tolerated.

## 2013-04-13 ENCOUNTER — Other Ambulatory Visit: Payer: Self-pay | Admitting: Internal Medicine

## 2013-05-15 ENCOUNTER — Telehealth: Payer: Self-pay | Admitting: Internal Medicine

## 2013-05-15 MED ORDER — PREDNISONE 10 MG PO TABS: ORAL_TABLET | ORAL | Status: DC

## 2013-05-15 NOTE — Telephone Encounter (Signed)
rx sent. LMTCBx1 to advise the pt. Carron Curie, CMA

## 2013-05-15 NOTE — Telephone Encounter (Signed)
Last OV 11-2012, upcoming appt on 06-02-13. I spoke with the pt and she is c/o increased SOB with exertion, dry cough, chest congestion but unable to cough up anything, feels it gets stuck in her throat. She also states she can hear herself wheezing.  No Known Allergies Current Outpatient Prescriptions on File Prior to Visit  Medication Sig Dispense Refill  . albuterol (PROVENTIL HFA;VENTOLIN HFA) 108 (90 BASE) MCG/ACT inhaler Inhale 2 puffs into the lungs every 6 (six) hours as needed for wheezing or shortness of breath. For shortness of breath  1 Inhaler  prn  . albuterol (PROVENTIL) (2.5 MG/3ML) 0.083% nebulizer solution Use 1 vial 3 times daily as needed DX 496  300 mL  PRN  . ALPRAZolam (XANAX) 0.25 MG tablet Take 0.25 mg by mouth at bedtime as needed. For sleep      . aspirin EC 81 MG EC tablet Take 1 tablet (81 mg total) by mouth daily.      . chlorpheniramine-HYDROcodone (TUSSIONEX PENNKINETIC ER) 10-8 MG/5ML LQCR Take 5 mLs by mouth every 12 (twelve) hours as needed.  200 mL  0  . diphenhydrAMINE (BENADRYL) 25 mg capsule Take 25 mg by mouth daily as needed. For itching      . metoprolol tartrate (LOPRESSOR) 25 MG tablet 1/2 tablet twice daily      . Polyethyl Glycol-Propyl Glycol (SYSTANE FREE OP) Place 1 drop into both eyes daily as needed. For red eyes      . pravastatin (PRAVACHOL) 10 MG tablet Take 2 tablets (20 mg total) by mouth every evening.  30 tablet  3   No current facility-administered medications on file prior to visit.

## 2013-05-15 NOTE — Telephone Encounter (Signed)
Suggest prednisone 10 mg, # 20, 4 X 2 DAYS, 3 X 2 DAYS, 2 X 2 DAYS, 1 X 2 DAYS  

## 2013-05-15 NOTE — Telephone Encounter (Signed)
Pt advised. Laurette Villescas, CMA  

## 2013-06-02 ENCOUNTER — Encounter (INDEPENDENT_AMBULATORY_CARE_PROVIDER_SITE_OTHER): Payer: Self-pay

## 2013-06-02 ENCOUNTER — Ambulatory Visit (INDEPENDENT_AMBULATORY_CARE_PROVIDER_SITE_OTHER)
Admission: RE | Admit: 2013-06-02 | Discharge: 2013-06-02 | Disposition: A | Discharge status: Home / Self Care | Payer: Medicare Other | Source: Ambulatory Visit | Attending: Internal Medicine | Admitting: Internal Medicine

## 2013-06-02 ENCOUNTER — Encounter: Payer: Self-pay | Admitting: Internal Medicine

## 2013-06-02 ENCOUNTER — Ambulatory Visit (INDEPENDENT_AMBULATORY_CARE_PROVIDER_SITE_OTHER): Payer: Medicare Other | Admitting: Internal Medicine

## 2013-06-02 VITALS — BP 142/84 | HR 67 | Ht 62.0 in | Wt 237.6 lb

## 2013-06-02 DIAGNOSIS — J4489 Other specified chronic obstructive pulmonary disease: Secondary | ICD-10-CM

## 2013-06-02 DIAGNOSIS — Z85118 Personal history of other malignant neoplasm of bronchus and lung: Secondary | ICD-10-CM

## 2013-06-02 DIAGNOSIS — M791 Myalgia, unspecified site: Secondary | ICD-10-CM

## 2013-06-02 DIAGNOSIS — G4733 Obstructive sleep apnea (adult) (pediatric): Secondary | ICD-10-CM

## 2013-06-02 DIAGNOSIS — J449 Chronic obstructive pulmonary disease, unspecified: Secondary | ICD-10-CM

## 2013-06-02 DIAGNOSIS — J440 Chronic obstructive pulmonary disease with acute lower respiratory infection: Secondary | ICD-10-CM

## 2013-06-02 DIAGNOSIS — IMO0001 Reserved for inherently not codable concepts without codable children: Secondary | ICD-10-CM

## 2013-06-02 DIAGNOSIS — J209 Acute bronchitis, unspecified: Secondary | ICD-10-CM

## 2013-06-02 MED ORDER — HYDROCOD POLST-CHLORPHEN POLST 10-8 MG/5ML PO LQCR: 5.0000 mL | Freq: Two times a day (BID) | ORAL | Status: DC | PRN

## 2013-06-02 NOTE — Progress Notes (Signed)
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 Last here- 06/27/10 She feels "80% better" since last here. Has been able to lose a little weight which helped. No more hemoptysis. Using pro-air rescue inhaler only if she exerts herself a lot and continues maintenance Spiriva. No cough or recent wheeze. CXR 06/27/2010-NAD, clear.  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 States that breathing has not been good lately. lots of sob, wheezing and cough. feels fatigued all the time. Little effect from her rescue inhaler. Easy dyspnea walking across parking lot. Post hospital visit-04/29/2012 2 05/02/2012 by Dr.Ganji/ cardiology for unstable angina. Clear coronary arteries on cath. Was not anemic. Reports increased sharp pains in chest. Has felt better since fluid was removed from her lap band. COPD assessment test (CABG) score 22/40 PFT 07/05/2008-moderate obstructive airways disease with response to bronchodilator, diffusion moderately reduced. FEV1 1.75/87%, FEV1/FVC 0.53, DLCO 49% CXR 04/29/12-reviewed IMPRESSION:  No acute cardiopulmonary disease. No change from the prior study.  Original Report Authenticated By: Domenic Moras, M.D.   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 FOLLOWS FOR: Acute illness-this fever 100.6 highest; cough-productive dirty yellow in color, SOB, wheezing. Using neb tx(helps slightly and rescue inhaler, as well as Tussionex) Hydromet cough syrup is ineffective. Now fifth day of acute illness as described above. Steadily progressive aching. 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 Reports DOE, dry cough and wheezing. Denies chest pain or chest tightness.  No acute event. CXR 10/ 11 /13 IMPRESSION:  No acute cardiopulmonary disease. No change from the prior study.  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  CAT score 24 Weight up from 228 in Jan to 237 now.  Using Benadryl to sleep that  carries over with some daytime tiredness Increased cough this week without fever or purulent discharge Complains of chest wall and thigh cramps, present for some months. We discussed possible relationship to her statin.  ROS-see HPI Constitutional:   No-   weight loss, night sweats, fevers, chills,+ fatigue, lassitude. HEENT:   No-  headaches, difficulty swallowing, tooth/dental problems, sore throat,       No-  sneezing, itching, ear ache, +nasal congestion, post nasal drip,  CV:  No-   chest pain, orthopnea, PND, swelling in lower extremities, anasarca, dizziness, palpitations Resp: +  shortness of breath with exertion or at rest.              No-   productive cough,  + non-productive cough,  No- coughing up of blood.              No-   change in color of mucus. +wheezing.   Skin: No-   rash or lesions. GI:  No-   heartburn, indigestion, abdominal pain, nausea, vomiting, GU: . MS:  No-   joint pain or swelling.  + cramping myalgias Neuro-     nothing unusual Psych:  No-  change in mood or affect. No depression or anxiety.  No memory loss.  OBJ General- Alert, Oriented, Affect-appropriate, Distress- none acute, +obese Skin- rash-none, lesions- none, excoriation- none.  Lymphadenopathy- none Head- atraumatic            Eyes- Gross vision intact, PERRLA, conjunctivae clear secretions             Ears- Hearing, canals-normal            Nose- Clear, no-Septal dev, mucus, polyps, erosion, perforation             Throat- Mallampati III-IV , mucosa + red , drainage- none, tonsils- atrophic Neck- flexible , trachea midline, no stridor , thyroid nl, carotid no bruit Chest - symmetrical excursion , unlabored           Heart/CV- RRR , no murmur , no gallop  , no rub, nl s1 s2                           - JVD- none , edema- none, stasis changes- none, varices- none           Lung- +raspy, unlabored, wheeze- none,+ dry cough , dullness-none, rub- none           Chest wall- scar left chest Port-A-Cath Abd-  Br/ Gen/ Rectal- Not done, not indicated Extrem- cyanosis- none, clubbing, none, atrophy- none, strength- nl Neuro- grossly intact to observation

## 2013-06-02 NOTE — Patient Instructions (Signed)
Flu vax- standard  Order- CXR    Dx COPD  Order- split protocol NPSG   Dx OSA

## 2013-06-09 ENCOUNTER — Telehealth: Payer: Self-pay | Admitting: Internal Medicine

## 2013-06-09 NOTE — Telephone Encounter (Signed)
I spoke with patient about results and she verbalized understanding and had no questions 

## 2013-06-09 NOTE — Telephone Encounter (Signed)
CXR- stable with clear lungs. 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 Nothing new or active

## 2013-06-09 NOTE — Telephone Encounter (Signed)
Please advise regarding CXr results Dr. Maple Hudson thanks  --CXR printed with note.

## 2013-06-18 DIAGNOSIS — M791 Myalgia, unspecified site: Secondary | ICD-10-CM | POA: Insufficient documentation

## 2013-06-18 DIAGNOSIS — J209 Acute bronchitis, unspecified: Secondary | ICD-10-CM | POA: Insufficient documentation

## 2013-06-18 NOTE — Assessment & Plan Note (Signed)
No recurrence. 

## 2013-06-18 NOTE — Assessment & Plan Note (Signed)
Suspect relation to her statin. Plan-trial of statin for one month. Discuss results with her PCP.

## 2013-06-18 NOTE — Assessment & Plan Note (Signed)
Sounds like a viral syndrome this week. She will manage symptomatically as discussed. Plan-chest x-ray, flu vaccine

## 2013-06-22 ENCOUNTER — Encounter (INDEPENDENT_AMBULATORY_CARE_PROVIDER_SITE_OTHER): Payer: Self-pay | Admitting: Physician Assistant

## 2013-06-22 ENCOUNTER — Ambulatory Visit (INDEPENDENT_AMBULATORY_CARE_PROVIDER_SITE_OTHER): Payer: Medicare Other | Admitting: Physician Assistant

## 2013-06-22 ENCOUNTER — Encounter (INDEPENDENT_AMBULATORY_CARE_PROVIDER_SITE_OTHER): Payer: Medicare Other

## 2013-06-22 VITALS — BP 126/76 | HR 62 | Temp 98.0°F | Resp 18 | Ht 60.25 in | Wt 239.0 lb

## 2013-06-22 DIAGNOSIS — Z4651 Encounter for fitting and adjustment of gastric lap band: Secondary | ICD-10-CM

## 2013-06-22 NOTE — Progress Notes (Signed)
  HISTORY: [name] is a 67 y.o.female who received an AP-Standard lap-band in February 2010 by Dr. Ezzard Standing. She comes in with 6 lbs weight gain since her last visit. She is now within 18 lbs of her pre-op weight. She says she's now able to eat bread as a part of a sandwich, which she hasn't been able to do in the past. She denies regurgitation or reflux. She rarely eats much during the day but will awaken at night and snack due to persistent insomnia.  VITAL SIGNS: Filed Vitals:   06/22/13 1107  BP: 126/76  Pulse: 62  Temp: 98 F (36.7 C)  Resp: 18    PHYSICAL EXAM: Physical exam reveals a very well-appearing 67 y.o.female in no apparent distress Neurologic: Awake, alert, oriented Psych: Bright affect, conversant Respiratory: Breathing even and unlabored. No stridor or wheezing Abdomen: Soft, nontender, nondistended to palpation. Incisions well-healed. No incisional hernias. Port easily palpated. Extremities: Atraumatic, good range of motion.  ASSESMENT: 67 y.o.  female  s/p AP-Standard lap-band.   PLAN: The patient's port was accessed with a 20G Huber needle without difficulty. Clear fluid was aspirated and 0.5 mL saline was added to the port to give a total predicted volume of 6.5 mL. This is after confirmation of 6 mL fluid in the band. The patient was able to swallow water without difficulty following the procedure and was instructed to take clear liquids for the next 24-48 hours and advance slowly as tolerated. Hopefully this will give her an acceptable level of restriction. We'll have her back in three months or sooner if needed.

## 2013-06-22 NOTE — Patient Instructions (Signed)

## 2013-07-03 ENCOUNTER — Ambulatory Visit (HOSPITAL_BASED_OUTPATIENT_CLINIC_OR_DEPARTMENT_OTHER): Payer: Medicare Other | Attending: Internal Medicine | Admitting: Radiology

## 2013-07-03 VITALS — Ht 62.0 in | Wt 234.0 lb

## 2013-07-03 DIAGNOSIS — R0989 Other specified symptoms and signs involving the circulatory and respiratory systems: Secondary | ICD-10-CM | POA: Insufficient documentation

## 2013-07-03 DIAGNOSIS — G4733 Obstructive sleep apnea (adult) (pediatric): Secondary | ICD-10-CM

## 2013-07-03 DIAGNOSIS — R0609 Other forms of dyspnea: Secondary | ICD-10-CM | POA: Insufficient documentation

## 2013-07-08 DIAGNOSIS — G4733 Obstructive sleep apnea (adult) (pediatric): Secondary | ICD-10-CM

## 2013-07-08 NOTE — Sleep Study (Signed)
   NAME: [name] DATE OF BIRTH:  Jul 19, 1946 MEDICAL RECORD NUMBER [id number]  LOCATION: [facility]  PHYSICIAN: YOUNG,CLINTON D  DATE OF STUDY: 07/03/2013  SLEEP STUDY TYPE: Nocturnal Polysomnogram               REFERRING PHYSICIAN: Jetty Duhamel D, MD  INDICATION FOR STUDY: Insomnia with sleep apnea  EPWORTH SLEEPINESS SCORE:   10/24 HEIGHT: 5\' 2"  (157.5 cm)  WEIGHT: 234 lb (106.142 kg)    Body mass index is 42.79 kg/(m^2).  NECK SIZE: 14.5 in.  MEDICATIONS: Charted for review  SLEEP ARCHITECTURE: Total sleep time 293 minutes with sleep efficiency 73.6%. Stage I was 11.4%, stage II 72.5%, stage III absent, REM 16% of total sleep time. Sleep latency 6.5 minutes, REM latency 175.5 minutes, awake after sleep onset 98.5 minutes, arousal index of 14.3. Bedtime medication: Benadryl, melatonin  RESPIRATORY DATA: Apnea hypopnea index (AHI) 5.3 per hour. A total of 26 events were scored including one obstructive apnea, 3 central apneas, 22 hypopneas. All events were associated with non-supine sleep position. REM AHI 30.6 per hour. There were not enough events to qualify for split protocol CPAP titration.  OXYGEN DATA: Moderately loud snoring with oxygen desaturation to a nadir of 70% and mean oxygen saturation through the study of 90.4% on room air.  CARDIAC DATA: Normal sinus rhythm  MOVEMENT/PARASOMNIA: A total of 20 limb jerks were counted of which 4 were associated with arousal or awakening for periodic limb movement with arousal index of 0.8 per hour. No bathroom trips.  IMPRESSION/ RECOMMENDATION:   1) Minimal obstructive sleep apnea/hypopnea syndrome, AHI 5.3 per hour with nonsupine events. REM AHI 30.6 per hour. Moderately loud snoring with oxygen desaturation to a nadir of 70% and mean oxygen saturation through the study of 90.4% on room air.   Signed Jetty Duhamel M.D. Waymon Budge Diplomate, American Board of Sleep  Medicine   ELECTRONICALLY SIGNED ON:  07/08/2013, 3:44 PM [facility] PH: [phone]   FX: [phone] ACCREDITED BY THE AMERICAN ACADEMY OF SLEEP MEDICINE

## 2013-07-17 ENCOUNTER — Ambulatory Visit (INDEPENDENT_AMBULATORY_CARE_PROVIDER_SITE_OTHER): Payer: Medicare Other | Admitting: Internal Medicine

## 2013-07-17 ENCOUNTER — Encounter: Payer: Self-pay | Admitting: Internal Medicine

## 2013-07-17 VITALS — BP 122/80 | HR 77 | Ht 62.0 in | Wt 228.6 lb

## 2013-07-17 DIAGNOSIS — Z85118 Personal history of other malignant neoplasm of bronchus and lung: Secondary | ICD-10-CM

## 2013-07-17 DIAGNOSIS — G4733 Obstructive sleep apnea (adult) (pediatric): Secondary | ICD-10-CM

## 2013-07-17 DIAGNOSIS — J449 Chronic obstructive pulmonary disease, unspecified: Secondary | ICD-10-CM

## 2013-07-17 NOTE — Progress Notes (Signed)
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 Last here- 06/27/10 She feels "80% better" since last here. Has been able to lose a little weight which helped. No more hemoptysis. Using pro-air rescue inhaler only if she exerts herself a lot and continues maintenance Spiriva. No cough or recent wheeze. CXR 06/27/2010-NAD, clear.  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 States that breathing has not been good lately. lots of sob, wheezing and cough. feels fatigued all the time. Little effect from her rescue inhaler. Easy dyspnea walking across parking lot. Post hospital visit-04/29/2012 2 05/02/2012 by Dr.Ganji/ cardiology for unstable angina. Clear coronary arteries on cath. Was not anemic. Reports increased sharp pains in chest. Has felt better since fluid was removed from her lap band. COPD assessment test (CABG) score 22/40 PFT 07/05/2008-moderate obstructive airways disease with response to bronchodilator, diffusion moderately reduced. FEV1 1.75/87%, FEV1/FVC 0.53, DLCO 49% CXR 04/29/12-reviewed IMPRESSION:  No acute cardiopulmonary disease. No change from the prior study.  Original Report Authenticated By: Domenic Moras, M.D.   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 FOLLOWS FOR: Acute illness-this fever 100.6 highest; cough-productive dirty yellow in color, SOB, wheezing. Using neb tx(helps slightly and rescue inhaler, as well as Tussionex) Hydromet cough syrup is ineffective. Now fifth day of acute illness as described above. Steadily progressive aching. 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 Reports DOE, dry cough and wheezing. Denies chest pain or chest tightness.  No acute event. CXR 10/ 11 /13 IMPRESSION:  No acute cardiopulmonary disease. No change from the prior study.  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  CAT score 24 Weight up from 228 in Jan to 237 now.  Using Benadryl to sleep that  carries over with some daytime tiredness Increased cough this week without fever or purulent discharge Complains of chest wall and thigh cramps, present for some months. We discussed possible relationship to her statin.  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  Discuss sleep study Breathing well with no respiratory infection this winter NPSG 07/03/13- AHI 5.3/ hr, weight 234 pounds CXR 06/02/13 IMPRESSION:  Chronic changes as described above. No acute abnormality seen in the  chest.  Electronically Signed  By: Roque Lias M.D.  On: 06/02/2013 15:49  ROS-see HPI Constitutional:   No-   weight loss, night sweats, fevers, chills,+ fatigue, lassitude. HEENT:   No-  headaches, difficulty swallowing, tooth/dental problems, sore throat,       No-  sneezing, itching, ear ache, +nasal congestion, post nasal drip,  CV:  No-   chest pain, orthopnea, PND, swelling in lower extremities, anasarca, dizziness, palpitations Resp: +  shortness of breath with exertion or at rest.              No-   productive cough,  non-productive cough,  No- coughing up of blood.              No-   change in color of mucus. +wheezing.   Skin: No-   rash or lesions. GI:  No-   heartburn,  indigestion, abdominal pain, nausea, vomiting, GU: . MS:  No-   joint pain or swelling.  + cramping myalgias Neuro-     nothing unusual Psych:  No- change in mood or affect. No depression or anxiety.  No memory loss.  OBJ General- Alert, Oriented, Affect-appropriate, Distress- none acute, +obese Skin- rash-none, lesions- none, excoriation- none.  Lymphadenopathy- none Head- atraumatic            Eyes- Gross vision intact, PERRLA, conjunctivae clear secretions            Ears- Hearing, canals-normal            Nose- Clear, no-Septal dev, mucus, polyps, erosion, perforation             Throat- Mallampati III-IV , mucosa + red , drainage- none, tonsils- atrophic Neck- flexible , trachea midline, no stridor , thyroid nl, carotid no bruit Chest - symmetrical excursion , unlabored           Heart/CV- RRR , no murmur , no gallop  , no rub, nl s1 s2                           - JVD- none , edema- none, stasis changes- none, varices- none           Lung- clear, wheeze- none, cough-none , dullness-none, rub- none           Chest wall- scar left chest Port-A-Cath Abd-  Br/ Gen/ Rectal- Not done, not indicated Extrem- cyanosis- none, clubbing, none, atrophy- none, strength- nl Neuro- grossly intact to observation

## 2013-07-17 NOTE — Patient Instructions (Signed)
Breathing at night and snoring can be better if you can lose some weight and sleep off the flat of your back  Please call as needed

## 2013-07-20 DIAGNOSIS — D493 Neoplasm of unspecified behavior of breast: Secondary | ICD-10-CM | POA: Insufficient documentation

## 2013-08-06 DIAGNOSIS — G4733 Obstructive sleep apnea (adult) (pediatric): Secondary | ICD-10-CM

## 2013-08-06 HISTORY — DX: Obstructive sleep apnea (adult) (pediatric): G47.33

## 2013-08-06 NOTE — Assessment & Plan Note (Signed)
No recurrence at this point

## 2013-08-06 NOTE — Assessment & Plan Note (Signed)
Well-controlled  at this time 

## 2013-11-09 ENCOUNTER — Encounter (INDEPENDENT_AMBULATORY_CARE_PROVIDER_SITE_OTHER): Payer: Self-pay

## 2013-11-09 ENCOUNTER — Ambulatory Visit (INDEPENDENT_AMBULATORY_CARE_PROVIDER_SITE_OTHER): Payer: Medicare Other | Admitting: Physician Assistant

## 2013-11-09 DIAGNOSIS — Z9884 Bariatric surgery status: Secondary | ICD-10-CM

## 2013-11-09 NOTE — Patient Instructions (Signed)
Return in three months. Focus on good food choices as well as physical activity. Return sooner if you have an increase in hunger, portion sizes or weight. Return also for difficulty swallowing, night cough, reflux.

## 2013-11-09 NOTE — Progress Notes (Signed)
  HISTORY: [name] is a 68 y.o.female who received an AP-Standard lap-band in February 2010 by Dr. Lucia Gaskins. She comes in today with 28 lbs weight loss since her last visit. She has lost 46 lbs since surgery. Since her last visit she's had a breast reduction, but the bulk of her weight loss has been from changes in her diet and avoiding sweets. She has no complaints of significant hunger or eating larger than desired portions. She has no regurgitation or reflux.  VITAL SIGNS: Filed Vitals:   11/09/13 1104  BP: 138/92  Pulse: 76  Temp: 98.6 F (37 C)  Resp: 14    PHYSICAL EXAM: Physical exam reveals a very well-appearing 68 y.o.female in no apparent distress Neurologic: Awake, alert, oriented Psych: Bright affect, conversant Respiratory: Breathing even and unlabored. No stridor or wheezing Extremities: Atraumatic, good range of motion. Skin: Warm, Dry, no rashes Musculoskeletal: Normal gait, Joints normal  ASSESMENT: 68 y.o.  female  s/p AP-Standard lap-band.   PLAN: She's squarely in the green zone so we deferred a fill today. She's very happy with her progress and is eager to get under 200 lbs. This is the lowest her weight has been since she's been with [place]. I congratulated her on her progress. I suggested walking about 20 minutes every morning as a start to physical activity. We'll have her back in three months or sooner if needed.

## 2013-12-26 ENCOUNTER — Ambulatory Visit (INDEPENDENT_AMBULATORY_CARE_PROVIDER_SITE_OTHER): Payer: Medicare Other | Admitting: Internal Medicine

## 2013-12-26 ENCOUNTER — Encounter: Payer: Self-pay | Admitting: Internal Medicine

## 2013-12-26 ENCOUNTER — Encounter (INDEPENDENT_AMBULATORY_CARE_PROVIDER_SITE_OTHER): Payer: Self-pay

## 2013-12-26 ENCOUNTER — Ambulatory Visit (INDEPENDENT_AMBULATORY_CARE_PROVIDER_SITE_OTHER)
Admission: RE | Admit: 2013-12-26 | Discharge: 2013-12-26 | Disposition: A | Discharge status: Home / Self Care | Payer: Medicare Other | Source: Ambulatory Visit | Attending: Internal Medicine | Admitting: Internal Medicine

## 2013-12-26 VITALS — BP 112/70 | HR 60 | Temp 98.0°F | Ht 60.0 in | Wt 207.6 lb

## 2013-12-26 DIAGNOSIS — J440 Chronic obstructive pulmonary disease with acute lower respiratory infection: Secondary | ICD-10-CM

## 2013-12-26 DIAGNOSIS — J209 Acute bronchitis, unspecified: Secondary | ICD-10-CM

## 2013-12-26 DIAGNOSIS — J441 Chronic obstructive pulmonary disease with (acute) exacerbation: Secondary | ICD-10-CM

## 2013-12-26 DIAGNOSIS — Z85118 Personal history of other malignant neoplasm of bronchus and lung: Secondary | ICD-10-CM

## 2013-12-26 MED ORDER — HYDROCOD POLST-CHLORPHEN POLST 10-8 MG/5ML PO LQCR: 5.0000 mL | Freq: Two times a day (BID) | ORAL | Status: DC | PRN

## 2013-12-26 MED ORDER — AZITHROMYCIN 250 MG PO TABS: ORAL_TABLET | ORAL | Status: DC

## 2013-12-26 NOTE — Patient Instructions (Signed)
Order- CXR dx  Acute exacerbation of COPD  Script sent for Zpak  Script printed for cough syrup

## 2013-12-26 NOTE — Progress Notes (Signed)
05/10/11- 2 yoF former smoker, followed for COPD, hx LLL resection/chemo 5038 NSCCA, complicated by DM, obesity/ lap band surgery. Last here- 06/27/10 She feels "80% better" since last here. Has been able to lose a little weight which helped. No more hemoptysis. Using pro-air rescue inhaler only if she exerts herself a lot and continues maintenance Spiriva. No cough or recent wheeze. CXR 06/27/2010-NAD, clear.  05/24/12- 6 yoF former smoker, followed for COPD, hx LLL resection/chemo 8828 NSCCA, complicated by DM, obesity/ lap band surgery. States that breathing has not been good lately. lots of sob, wheezing and cough. feels fatigued all the time. Little effect from her rescue inhaler. Easy dyspnea walking across parking lot. Post hospital visit-04/29/2012 2 05/02/2012 by Dr.Ganji/ cardiology for unstable angina. Clear coronary arteries on cath. Was not anemic. Reports increased sharp pains in chest. Has felt better since fluid was removed from her lap band. COPD assessment test (CABG) score 22/40 PFT 07/05/2008-moderate obstructive airways disease with response to bronchodilator, diffusion moderately reduced. FEV1 1.75/87%, FEV1/FVC 0.53, DLCO 49% CXR 04/29/12-reviewed IMPRESSION:  No acute cardiopulmonary disease. No change from the prior study.  Original Report Authenticated By: Lasandra Beech, M.D.   07/22/12- 64 yoF former smoker, followed for COPD, hx LLL resection/chemo 0034 NSCCA, complicated by DM, obesity/ lap band surgery. FOLLOWS FOR: Acute illness-this fever 100.6 highest; cough-productive dirty yellow in color, SOB, wheezing. Using neb tx(helps slightly and rescue inhaler, as well as Tussionex) Hydromet cough syrup is ineffective. Now fifth day of acute illness as described above. Steadily progressive aching. Queasy stomach with diarrhea  11/25/12- 66 yoF former smoker, followed for COPD, hx LLL resection/chemo 9179 NSCCA, complicated by DM, obesity/ lap band surgery FOLLOWS FOR:  Breathing has gotten worse. Reports DOE, dry cough and wheezing. Denies chest pain or chest tightness.  No acute event. CXR 10/ 11 /13 IMPRESSION:  No acute cardiopulmonary disease. No change from the prior study.  Original Report Authenticated By: Lasandra Beech, M.D.is COPD Assessment Test- 11/25/12    19  06/02/13- 66 yoF former smoker, followed for COPD, hx LLL resection/chemo 1505 NSCCA, complicated by DM, obesity/ lap band surgery Follows for- SOB with exertion, frequent urination, some chest discomfort, fatigue.  CAT score 24 Weight up from 228 in Jan to 237 now.  Using Benadryl to sleep that  carries over with some daytime tiredness Increased cough this week without fever or purulent discharge Complains of chest wall and thigh cramps, present for some months. We discussed possible relationship to her statin.  07/17/13- 67 yoF former smoker, followed for COPD, hx LLL resection/chemo 6979 NSCCA, complicated by DM, obesity/ lap band surgery FOLLOWS FOR:  Breathing doing well.  Discuss sleep study Breathing well with no respiratory infection this winter NPSG 07/03/13- AHI 5.3/ hr, weight 234 pounds CXR 06/02/13 IMPRESSION:  Chronic changes as described above. No acute abnormality seen in the  chest.  Electronically Signed  By: Sabino Dick M.D.  On: 06/02/2013 15:49  12/26/13- 67 yoF former smoker, followed for COPD, hx LLL resection/chemo 4801 NSCCA, complicated by DM, obesity/ lap band surgery FOLLOWS FOR: Pt states she has an increase in cough with grey, green mucous with bloody streaks since 6/4. Pt states these s/s started when she went to nail salon. Denies CP/tightness.   Blames smell of acetone at nail salon. Small streak of blood only with very hard cough. Denies chest pain, palpitation, edema, adenopathy. Had uncomplicated breast reduction surgery previously.  ROS-see HPI Constitutional:   No-   weight  loss, night sweats, fevers, chills,+ fatigue, lassitude. HEENT:   No-   headaches, difficulty swallowing, tooth/dental problems, sore throat,       No-  sneezing, itching, ear ache, +nasal congestion, post nasal drip,  CV:  No-   chest pain, orthopnea, PND, swelling in lower extremities, anasarca, dizziness, palpitations Resp: +  shortness of breath with exertion or at rest.              +productive cough,  non-productive cough,  + coughing up of blood.              No-   change in color of mucus. +wheezing.   Skin: No-   rash or lesions. GI:  No-   heartburn, indigestion, abdominal pain, nausea, vomiting, GU: . MS:  No-   joint pain or swelling.  + cramping myalgias Neuro-     nothing unusual Psych:  No- change in mood or affect. No depression or anxiety.  No memory loss.  OBJ General- Alert, Oriented, Affect-appropriate, Distress- none acute, +obese Skin- rash-none, lesions- none, excoriation- none.  Lymphadenopathy- none Head- atraumatic            Eyes- Gross vision intact, PERRLA, conjunctivae clear secretions            Ears- Hearing, canals-normal            Nose- Clear, no-Septal dev, mucus, polyps, erosion, perforation             Throat- Mallampati III-IV , mucosa + red , drainage- none, tonsils- atrophic Neck- flexible , trachea midline, no stridor , thyroid nl, carotid no bruit Chest - symmetrical excursion , unlabored           Heart/CV- RRR , no murmur , no gallop  , no rub, nl s1 s2                           - JVD- none , edema- none, stasis changes- none, varices- none           Lung- clear, wheeze- none, cough+ mild , dullness-none, rub- none           Chest wall- scar left chest Port-A-Cath Abd-  Br/ Gen/ Rectal- Not done, not indicated Extrem- cyanosis- none, clubbing, none, atrophy- none, strength- nl Neuro- grossly intact to observation

## 2013-12-28 ENCOUNTER — Telehealth: Payer: Self-pay | Admitting: Internal Medicine

## 2013-12-28 NOTE — Telephone Encounter (Signed)
Results have been explained to patient, pt expressed understanding. Nothing further needed.  

## 2014-01-04 ENCOUNTER — Ambulatory Visit
Admission: RE | Admit: 2014-01-04 | Discharge: 2014-01-04 | Disposition: A | Discharge status: Home / Self Care | Payer: Medicare Other | Source: Ambulatory Visit | Attending: Family Medicine | Admitting: Family Medicine

## 2014-01-04 ENCOUNTER — Other Ambulatory Visit: Payer: Self-pay | Admitting: Family Medicine

## 2014-01-04 DIAGNOSIS — M25512 Pain in left shoulder: Secondary | ICD-10-CM

## 2014-01-15 ENCOUNTER — Ambulatory Visit: Payer: Medicare Other | Admitting: Internal Medicine

## 2014-01-29 ENCOUNTER — Telehealth: Payer: Self-pay | Admitting: Internal Medicine

## 2014-01-29 MED ORDER — AMOXICILLIN 500 MG PO TABS: 500.0000 mg | ORAL_TABLET | Freq: Three times a day (TID) | ORAL | Status: DC

## 2014-01-29 NOTE — Telephone Encounter (Signed)
Pt aware of recs. RX called in. Nothing further needed 

## 2014-01-29 NOTE — Telephone Encounter (Signed)
Last OV w/ CDY 12/26/13. Called spoke w/ pt. C/o hacking prod cough light yellow phlem, exhausted and SOB d/t all her coughing x Saturday. She is taking hydrocone cough syrup but doesn't seem to help. Please advise CDY thanks  No Known Allergies   Current Outpatient Prescriptions on File Prior to Visit  Medication Sig Dispense Refill  . albuterol (PROVENTIL) (2.5 MG/3ML) 0.083% nebulizer solution Use 1 vial 3 times daily as needed DX 496  300 mL  PRN  . ALPRAZolam (XANAX) 0.25 MG tablet Take 0.25 mg by mouth at bedtime as needed. For sleep      . aspirin EC 81 MG EC tablet Take 1 tablet (81 mg total) by mouth daily.      Marland Kitchen azithromycin (ZITHROMAX) 250 MG tablet 2 today then one daily  6 tablet  0  . chlorpheniramine-HYDROcodone (TUSSIONEX PENNKINETIC ER) 10-8 MG/5ML LQCR Take 5 mLs by mouth every 12 (twelve) hours as needed.  200 mL  0  . diphenhydrAMINE (BENADRYL) 25 mg capsule Take 25 mg by mouth daily as needed. For itching      . metoprolol tartrate (LOPRESSOR) 25 MG tablet 1/2 tablet twice daily      . Polyethyl Glycol-Propyl Glycol (SYSTANE FREE OP) Place 1 drop into both eyes daily as needed. For red eyes      . pravastatin (PRAVACHOL) 10 MG tablet Take 2 tablets (20 mg total) by mouth every evening.  30 tablet  3  . tiotropium (SPIRIVA) 18 MCG inhalation capsule Place 18 mcg into inhaler and inhale daily.       No current facility-administered medications on file prior to visit.

## 2014-01-29 NOTE — Telephone Encounter (Signed)
Pt returned call

## 2014-01-29 NOTE — Telephone Encounter (Signed)
lmomtcb x1 for pt 

## 2014-01-29 NOTE — Telephone Encounter (Signed)
Offer amoxacillin 500 mg, # 21, 1 three times daily  May also help to take an antihistamine like Allegra 180 or benadryl.

## 2014-02-07 ENCOUNTER — Telehealth: Payer: Self-pay | Admitting: Internal Medicine

## 2014-02-07 MED ORDER — PREDNISONE 10 MG PO TABS: ORAL_TABLET | ORAL | Status: DC

## 2014-02-07 NOTE — Telephone Encounter (Signed)
Called spoke with patient and discussed VS' recommendations.  Pt okay with these recommendations and verbalized her understanding.  Pt is aware her rx was sent to [facility] and that our office will call her back in the morning about an appt.  Dr Annamaria Boots, please advise on appt.  TP is not in the office the rest of the week.  Thank you.

## 2014-02-07 NOTE — Telephone Encounter (Signed)
Pt returning call. [place] Bing, CMA

## 2014-02-07 NOTE — Telephone Encounter (Signed)
lmomtcb x1 

## 2014-02-07 NOTE — Telephone Encounter (Signed)
Please send script for prednisone 10 mg pills >> 3 pills for 2 days, 2 pills for 2 days, 1 pill for 2 days.  Dispense 12 pills with no refills.  Please schedule her an appointment this week with Dr. Annamaria Boots or Tammy Parrett to further assess.

## 2014-02-07 NOTE — Telephone Encounter (Signed)
Called spoke with patient who reports her cough has worsened again, with mostly clear mucus but the amount of the mucus had increased.  She also mentions some wheezing, dyspnea, hoarseness, scratchy throat, fatigue.  The Tussionex does help the cough at night, but she does not take this during the day.  [name] is concerned about pneumonia (no recent xrays mention pneumonia).    CDY not in the office this afternoon, will forward to doc of the afternoon.  Dr Halford Chessman please advise, thank you. [facility] [name] was given zpak on 6.9.15 and Amoxicillin 500mg  TID x7days on 7.13.15 No Known Allergies    Medication List       This list is accurate as of: 02/07/14  2:49 PM.  Always use your most recent med list.               albuterol (2.5 MG/3ML) 0.083% nebulizer solution  Commonly known as:  PROVENTIL  Use 1 vial 3 times daily as needed DX 496     ALPRAZolam 0.25 MG tablet  Commonly known as:  XANAX  Take 0.25 mg by mouth at bedtime as needed. For sleep     amoxicillin 500 MG tablet  Commonly known as:  AMOXIL  Take 1 tablet (500 mg total) by mouth 3 (three) times daily.     aspirin 81 MG EC tablet  Take 1 tablet (81 mg total) by mouth daily.     azithromycin 250 MG tablet  Commonly known as:  ZITHROMAX  2 today then one daily     chlorpheniramine-HYDROcodone 10-8 MG/5ML Lqcr  Commonly known as:  TUSSIONEX PENNKINETIC ER  Take 5 mLs by mouth every 12 (twelve) hours as needed.     diphenhydrAMINE 25 mg capsule  Commonly known as:  BENADRYL  Take 25 mg by mouth daily as needed. For itching     metoprolol tartrate 25 MG tablet  Commonly known as:  LOPRESSOR  1/2 tablet twice daily     pravastatin 10 MG tablet  Commonly known as:  PRAVACHOL  Take 2 tablets (20 mg total) by mouth every evening.     SYSTANE FREE OP  Place 1 drop into both eyes daily as needed. For red eyes     tiotropium 18 MCG inhalation capsule  Commonly known as:  SPIRIVA  Place 18 mcg into inhaler and inhale  daily.

## 2014-02-08 ENCOUNTER — Encounter (INDEPENDENT_AMBULATORY_CARE_PROVIDER_SITE_OTHER): Payer: Medicare Other

## 2014-02-08 NOTE — Telephone Encounter (Signed)
Pt aware of CDY recs. Nothing further needed

## 2014-02-08 NOTE — Telephone Encounter (Signed)
Per CY-lets have patient take Rx of Prednisone given and call back first part of next week and let us know how she is feeling. If patient is concerned about PNA then she can come by for CBC diff and CXR as outpatient. Thanks.

## 2014-02-13 ENCOUNTER — Telehealth: Payer: Self-pay | Admitting: Internal Medicine

## 2014-02-13 NOTE — Telephone Encounter (Signed)
Pt returned call

## 2014-02-13 NOTE — Telephone Encounter (Signed)
Called spoke with pt. She reports she has finished the prednisone RX. Pt reports she is still having coughing spells-white/slight beige phlem. Every AM she coughs for about 10-15 min.  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 Please advise Dr. Annamaria Boots thanks  No Known Allergies    Current Outpatient Prescriptions on File Prior to Visit  Medication Sig Dispense Refill  . albuterol (PROVENTIL) (2.5 MG/3ML) 0.083% nebulizer solution Use 1 vial 3 times daily as needed DX 496  300 mL  PRN  . ALPRAZolam (XANAX) 0.25 MG tablet Take 0.25 mg by mouth at bedtime as needed. For sleep      . amoxicillin (AMOXIL) 500 MG tablet Take 1 tablet (500 mg total) by mouth 3 (three) times daily.  21 tablet  0  . aspirin EC 81 MG EC tablet Take 1 tablet (81 mg total) by mouth daily.      Marland Kitchen azithromycin (ZITHROMAX) 250 MG tablet 2 today then one daily  6 tablet  0  . chlorpheniramine-HYDROcodone (TUSSIONEX PENNKINETIC ER) 10-8 MG/5ML LQCR Take 5 mLs by mouth every 12 (twelve) hours as needed.  200 mL  0  . diphenhydrAMINE (BENADRYL) 25 mg capsule Take 25 mg by mouth daily as needed. For itching      . metoprolol tartrate (LOPRESSOR) 25 MG tablet 1/2 tablet twice daily      . Polyethyl Glycol-Propyl Glycol (SYSTANE FREE OP) Place 1 drop into both eyes daily as needed. For red eyes      . pravastatin (PRAVACHOL) 10 MG tablet Take 2 tablets (20 mg total) by mouth every evening.  30 tablet  3  . predniSONE (DELTASONE) 10 MG tablet 3 pills for 2 days, 2 pills for 2 days, 1 pill for 2 days and stop.  12 tablet  0  . tiotropium (SPIRIVA) 18 MCG inhalation capsule Place 18 mcg into inhaler and inhale daily.       No current facility-administered medications on file prior to visit.

## 2014-02-13 NOTE — Telephone Encounter (Signed)
Spoke with pt-- states that she wants to try the Mucinex DM for now and will call back if abx needed.  Nothing further needed.

## 2014-02-13 NOTE — Telephone Encounter (Signed)
LMTC x 1  

## 2014-02-13 NOTE — Telephone Encounter (Signed)
She could try Mucinex DM to help control this If she feels she still has some infection, the offer doxycycline 100 mg, # 8, 2 today then one daiy

## 2014-02-25 NOTE — Assessment & Plan Note (Signed)
Acute exacerbation associated with irritant odors exposure. Blood streak with hard coughing is probably a benign hemorrhagic bronchitis but we need to watch this former smoker as discussed. Plan-chest x-ray, Z-Pak, refill cough syrup

## 2014-02-25 NOTE — Assessment & Plan Note (Signed)
Plan-chest x-ray

## 2014-04-30 ENCOUNTER — Telehealth: Payer: Self-pay | Admitting: Internal Medicine

## 2014-04-30 MED ORDER — HYDROCOD POLST-CHLORPHEN POLST 10-8 MG/5ML PO LQCR: 5.0000 mL | Freq: Two times a day (BID) | ORAL | Status: DC | PRN

## 2014-04-30 NOTE — Telephone Encounter (Signed)
Called and spoke with pt and she is aware of rx that is ready to be picked up. She will try and come by today to pick this up.  Will hold in my box to see if she picks this up today and if not, i will mail to her tomorrow.

## 2014-04-30 NOTE — Telephone Encounter (Signed)
Spoke with patient, requesting refill of her Hydrocodone (Hycodan) cough syrup. Pt reports she has caught a cold and has a cough, unable to sleep at night.  Hycodan Rx last filled 2013 Upcoming OV 06-28-14  Please advise Dr Annamaria Boots. Thanks.  No Known Allergies Current Outpatient Prescriptions on File Prior to Visit  Medication Sig Dispense Refill  . albuterol (PROVENTIL) (2.5 MG/3ML) 0.083% nebulizer solution Use 1 vial 3 times daily as needed DX 496  300 mL  PRN  . ALPRAZolam (XANAX) 0.25 MG tablet Take 0.25 mg by mouth at bedtime as needed. For sleep      . amoxicillin (AMOXIL) 500 MG tablet Take 1 tablet (500 mg total) by mouth 3 (three) times daily.  21 tablet  0  . aspirin EC 81 MG EC tablet Take 1 tablet (81 mg total) by mouth daily.      Marland Kitchen azithromycin (ZITHROMAX) 250 MG tablet 2 today then one daily  6 tablet  0  . chlorpheniramine-HYDROcodone (TUSSIONEX PENNKINETIC ER) 10-8 MG/5ML LQCR Take 5 mLs by mouth every 12 (twelve) hours as needed.  200 mL  0  . diphenhydrAMINE (BENADRYL) 25 mg capsule Take 25 mg by mouth daily as needed. For itching      . metoprolol tartrate (LOPRESSOR) 25 MG tablet 1/2 tablet twice daily      . Polyethyl Glycol-Propyl Glycol (SYSTANE FREE OP) Place 1 drop into both eyes daily as needed. For red eyes      . pravastatin (PRAVACHOL) 10 MG tablet Take 2 tablets (20 mg total) by mouth every evening.  30 tablet  3  . predniSONE (DELTASONE) 10 MG tablet 3 pills for 2 days, 2 pills for 2 days, 1 pill for 2 days and stop.  12 tablet  0  . tiotropium (SPIRIVA) 18 MCG inhalation capsule Place 18 mcg into inhaler and inhale daily.       No current facility-administered medications on file prior to visit.

## 2014-04-30 NOTE — Telephone Encounter (Signed)
Ok to refill 

## 2014-05-02 NOTE — Telephone Encounter (Signed)
I checked outgoing folder and rx has been picked up.  Will close message

## 2014-06-08 ENCOUNTER — Other Ambulatory Visit: Payer: Self-pay | Admitting: Orthopaedic Surgery

## 2014-06-08 DIAGNOSIS — M542 Cervicalgia: Secondary | ICD-10-CM

## 2014-06-27 ENCOUNTER — Ambulatory Visit
Admission: RE | Admit: 2014-06-27 | Discharge: 2014-06-27 | Disposition: A | Discharge status: Home / Self Care | Payer: Medicare Other | Source: Ambulatory Visit | Attending: Orthopaedic Surgery | Admitting: Orthopaedic Surgery

## 2014-06-27 DIAGNOSIS — M542 Cervicalgia: Secondary | ICD-10-CM

## 2014-06-28 ENCOUNTER — Encounter (HOSPITAL_COMMUNITY): Payer: Self-pay | Admitting: Cardiology

## 2014-06-28 ENCOUNTER — Ambulatory Visit: Payer: Medicare Other | Admitting: Internal Medicine

## 2014-06-28 VITALS — BP 122/68 | HR 57 | Ht 60.0 in | Wt 204.0 lb

## 2014-06-28 DIAGNOSIS — J440 Chronic obstructive pulmonary disease with acute lower respiratory infection: Principal | ICD-10-CM

## 2014-06-28 DIAGNOSIS — J209 Acute bronchitis, unspecified: Secondary | ICD-10-CM

## 2014-06-28 MED ORDER — ALBUTEROL SULFATE (2.5 MG/3ML) 0.083% IN NEBU: INHALATION_SOLUTION | RESPIRATORY_TRACT | Status: DC

## 2014-06-28 MED ORDER — ALBUTEROL SULFATE 108 (90 BASE) MCG/ACT IN AEPB: 2.0000 | INHALATION_SPRAY | RESPIRATORY_TRACT | Status: DC | PRN

## 2014-06-28 MED ORDER — TIOTROPIUM BROMIDE MONOHYDRATE 18 MCG IN CAPS: 18.0000 ug | ORAL_CAPSULE | Freq: Every day | RESPIRATORY_TRACT | Status: DC

## 2014-06-28 MED ORDER — COMPRESSOR/NEBULIZER MISC
Status: AC
Start: 2014-06-28 — End: ?

## 2014-06-28 NOTE — Patient Instructions (Signed)
Script printed for new nebulizer and albuterol neb solution to send to [facility]             For neb every 6 hours if needed    Dx COPD with bronchitis, controlled  Script printed, with sample card and instruction, for free albuterol rescue inhaler  When you are ready, you can decide whether to go back to your  blue Ventolin albuterol inhaler, or stay with the new Proair Respiclick device. Don't use both, that would be duplication.

## 2014-06-28 NOTE — Progress Notes (Signed)
05/10/11- 76 yoF former smoker, followed for COPD, hx LLL resection/chemo 6734 NSCCA, complicated by DM, obesity/ lap band surgery. Last here- 06/27/10 She feels "80% better" since last here. Has been able to lose a little weight which helped. No more hemoptysis. Using pro-air rescue inhaler only if she exerts herself a lot and continues maintenance Spiriva. No cough or recent wheeze. CXR 06/27/2010-NAD, clear.  05/24/12- 33 yoF former smoker, followed for COPD, hx LLL resection/chemo 1937 NSCCA, complicated by DM, obesity/ lap band surgery. States that breathing has not been good lately. lots of sob, wheezing and cough. feels fatigued all the time. Little effect from her rescue inhaler. Easy dyspnea walking across parking lot. Post hospital visit-04/29/2012 2 05/02/2012 by Dr.Ganji/ cardiology for unstable angina. Clear coronary arteries on cath. Was not anemic. Reports increased sharp pains in chest. Has felt better since fluid was removed from her lap band. COPD assessment test (CABG) score 22/40 PFT 07/05/2008-moderate obstructive airways disease with response to bronchodilator, diffusion moderately reduced. FEV1 1.75/87%, FEV1/FVC 0.53, DLCO 49% CXR 04/29/12-reviewed IMPRESSION:  No acute cardiopulmonary disease. No change from the prior study.  Original Report Authenticated By: Lasandra Beech, M.D.   07/22/12- 3 yoF former smoker, followed for COPD, hx LLL resection/chemo 9024 NSCCA, complicated by DM, obesity/ lap band surgery. FOLLOWS FOR: Acute illness-this fever 100.6 highest; cough-productive dirty yellow in color, SOB, wheezing. Using neb tx(helps slightly and rescue inhaler, as well as Tussionex) Hydromet cough syrup is ineffective. Now fifth day of acute illness as described above. Steadily progressive aching. Queasy stomach with diarrhea  11/25/12- 66 yoF former smoker, followed for COPD, hx LLL resection/chemo 0973 NSCCA, complicated by DM, obesity/ lap band surgery FOLLOWS FOR:  Breathing has gotten worse. Reports DOE, dry cough and wheezing. Denies chest pain or chest tightness.  No acute event. CXR 10/ 11 /13 IMPRESSION:  No acute cardiopulmonary disease. No change from the prior study.  Original Report Authenticated By: Lasandra Beech, M.D.is COPD Assessment Test- 11/25/12    19  06/02/13- 66 yoF former smoker, followed for COPD, hx LLL resection/chemo 5329 NSCCA, complicated by DM, obesity/ lap band surgery Follows for- SOB with exertion, frequent urination, some chest discomfort, fatigue.  CAT score 24 Weight up from 228 in Jan to 237 now.  Using Benadryl to sleep that  carries over with some daytime tiredness Increased cough this week without fever or purulent discharge Complains of chest wall and thigh cramps, present for some months. We discussed possible relationship to her statin.  07/17/13- 67 yoF former smoker, followed for COPD, hx LLL resection/chemo 9242 NSCCA, complicated by DM, obesity/ lap band surgery FOLLOWS FOR:  Breathing doing well.  Discuss sleep study Breathing well with no respiratory infection this winter NPSG 07/03/13- AHI 5.3/ hr, weight 234 pounds CXR 06/02/13 IMPRESSION:  Chronic changes as described above. No acute abnormality seen in the  chest.  Electronically Signed  By: Sabino Dick M.D.  On: 06/02/2013 15:49  12/26/13- 67 yoF former smoker, followed for COPD, hx LLL resection/chemo 6834 NSCCA, complicated by DM, obesity/ lap band surgery FOLLOWS FOR: Pt states she has an increase in cough with grey, green mucous with bloody streaks since 6/4. Pt states these s/s started when she went to nail salon. Denies CP/tightness.   Blames smell of acetone at nail salon. Small streak of blood only with very hard cough. Denies chest pain, palpitation, edema, adenopathy. Had uncomplicated breast reduction surgery previously.  06/28/14 7 yoF former smoker, followed for COPD, hx  LLL resection/chemo 1610 NSCCA, complicated by DM, obesity/ lap  band surgery FOLLOWS RUE:AVWUJ neb machine and albuterol rx to [facility]; rescue inhaler refilled (uses Ventolin but needs something she can get for now for cheaper-Proair with coupon).Will need Spiriva refilled as well. Breathing is doing well as long as no extreme exertion.   CXR 12/26/13 IMPRESSION: Emphysema. No acute abnormalities. Electronically Signed  By: Rozetta Nunnery M.D.  On: 12/26/2013 15:20  ROS-see HPI Constitutional:   No-   weight loss, night sweats, fevers, chills,+ fatigue, lassitude. HEENT:   No-  headaches, difficulty swallowing, tooth/dental problems, sore throat,       No-  sneezing, itching, ear ache, +nasal congestion, post nasal drip,  CV:  No-   chest pain, orthopnea, PND, swelling in lower extremities, anasarca, dizziness, palpitations Resp: +  shortness of breath with exertion or at rest.              +productive cough,  non-productive cough,  + coughing up of blood.              No-   change in color of mucus. +wheezing.   Skin: No-   rash or lesions. GI:  No-   heartburn, indigestion, abdominal pain, nausea, vomiting, GU: . MS:  No-   joint pain or swelling.  + cramping myalgias Neuro-     nothing unusual Psych:  No- change in mood or affect. No depression or anxiety.  No memory loss.  OBJ General- Alert, Oriented, Affect-appropriate, Distress- none acute, +obese Skin- rash-none, lesions- none, excoriation- none.  Lymphadenopathy- none Head- atraumatic            Eyes- Gross vision intact, PERRLA, conjunctivae clear secretions            Ears- Hearing, canals-normal            Nose- Clear, no-Septal dev, mucus, polyps, erosion, perforation             Throat- Mallampati III-IV , mucosa + red , drainage- none, tonsils- atrophic Neck- flexible , trachea midline, no stridor , thyroid nl, carotid no bruit Chest - symmetrical excursion , unlabored           Heart/CV- RRR , no murmur , no gallop  , no rub, nl s1 s2                           - JVD- none , edema-  none, stasis changes- none, varices- none           Lung- clear, wheeze- none, cough+ mild , dullness-none, rub- none           Chest wall- scar left chest Port-A-Cath Abd-  Br/ Gen/ Rectal- Not done, not indicated Extrem- cyanosis- none, clubbing, none, atrophy- none, strength- nl Neuro- grossly intact to observation

## 2014-12-31 ENCOUNTER — Ambulatory Visit: Payer: Medicare Other | Admitting: Internal Medicine

## 2015-05-08 ENCOUNTER — Other Ambulatory Visit: Payer: Self-pay

## 2015-05-08 DIAGNOSIS — Z1231 Encounter for screening mammogram for malignant neoplasm of breast: Secondary | ICD-10-CM

## 2015-06-03 ENCOUNTER — Ambulatory Visit: Payer: BC Managed Care – PPO

## 2015-07-03 ENCOUNTER — Telehealth: Payer: Self-pay | Admitting: Internal Medicine

## 2015-07-03 NOTE — Telephone Encounter (Signed)
Attempted to contact pt at [phone] (the number given) and received a message that this number has been disconnected. lmtcb x1 for pt on her listed home number.

## 2015-07-03 NOTE — Telephone Encounter (Signed)
Patient Returned call [phone]

## 2015-07-03 NOTE — Telephone Encounter (Signed)
Spoke with patient- Needs OV - c/o increased SOB, chest tightness/heaviness and sluggish feeling x 2 weeks. Reports O2 reading in low 90's. Pt has not been seen since 06/28/2014 and cancelled last OV 12/2014. Aware that we have no available appts with Dr Annamaria Boots today. Patient okay with waiting until 07/04/15 to see CY, states that she will call if she gets any worse. Patient scheduled to see Dr Annamaria Boots 07/04/15 at 3pm. Nothing further needed.

## 2015-07-04 ENCOUNTER — Encounter: Payer: Self-pay | Admitting: Internal Medicine

## 2015-07-04 ENCOUNTER — Ambulatory Visit (INDEPENDENT_AMBULATORY_CARE_PROVIDER_SITE_OTHER): Payer: Medicare Other | Admitting: Internal Medicine

## 2015-07-04 ENCOUNTER — Ambulatory Visit (INDEPENDENT_AMBULATORY_CARE_PROVIDER_SITE_OTHER)
Admission: RE | Admit: 2015-07-04 | Discharge: 2015-07-04 | Disposition: A | Discharge status: Home / Self Care | Payer: Medicare Other | Source: Ambulatory Visit | Attending: Internal Medicine | Admitting: Internal Medicine

## 2015-07-04 VITALS — BP 112/78 | HR 63 | Ht 60.0 in | Wt 215.4 lb

## 2015-07-04 DIAGNOSIS — Z85118 Personal history of other malignant neoplasm of bronchus and lung: Secondary | ICD-10-CM | POA: Diagnosis not present

## 2015-07-04 DIAGNOSIS — J449 Chronic obstructive pulmonary disease, unspecified: Secondary | ICD-10-CM

## 2015-07-04 DIAGNOSIS — Z23 Encounter for immunization: Secondary | ICD-10-CM | POA: Diagnosis not present

## 2015-07-04 MED ORDER — HYDROCODONE-HOMATROPINE 5-1.5 MG/5ML PO SYRP: 5.0000 mL | ORAL_SOLUTION | Freq: Four times a day (QID) | ORAL | Status: DC | PRN

## 2015-07-04 NOTE — Assessment & Plan Note (Signed)
Mild to moderate obstructive airways disease. Mild bronchitic exacerbation. Previous lung cancer resection. Also thinks she needs right now is cough syrup-discussed. Plan-chest x-ray, refill cough syrup, office spirometry done

## 2015-07-04 NOTE — Patient Instructions (Addendum)
Script printed for cough syrup   Order- office spirometry   Dx dyspnea on exertion  Order CXR    Dx hx lung Ca resection, COPD  Prevnar 13   Pneumonia vaccine

## 2015-07-04 NOTE — Assessment & Plan Note (Signed)
No recurrence so far Plan-chest x-ray

## 2015-07-04 NOTE — Progress Notes (Signed)
05/10/11- 69 yoF former smoker, followed for COPD, hx LLL resection/chemo 6734 NSCCA, complicated by DM, obesity/ lap band surgery. Last here- 06/27/10 She feels "80% better" since last here. Has been able to lose a little weight which helped. No more hemoptysis. Using pro-air rescue inhaler only if she exerts herself a lot and continues maintenance Spiriva. No cough or recent wheeze. CXR 06/27/2010-NAD, clear.  05/24/12- 69 yoF former smoker, followed for COPD, hx LLL resection/chemo 1937 NSCCA, complicated by DM, obesity/ lap band surgery. States that breathing has not been good lately. lots of sob, wheezing and cough. feels fatigued all the time. Little effect from her rescue inhaler. Easy dyspnea walking across parking lot. Post hospital visit-04/29/2012 2 05/02/2012 by Dr.Ganji/ cardiology for unstable angina. Clear coronary arteries on cath. Was not anemic. Reports increased sharp pains in chest. Has felt better since fluid was removed from her lap band. COPD assessment test (CABG) score 22/40 PFT 07/05/2008-moderate obstructive airways disease with response to bronchodilator, diffusion moderately reduced. FEV1 1.75/87%, FEV1/FVC 0.53, DLCO 49% CXR 04/29/12-reviewed IMPRESSION:  No acute cardiopulmonary disease. No change from the prior study.  Original Report Authenticated By: Lasandra Beech, M.D.   07/22/12- 69 yoF former smoker, followed for COPD, hx LLL resection/chemo 9024 NSCCA, complicated by DM, obesity/ lap band surgery. FOLLOWS FOR: Acute illness-this fever 100.6 highest; cough-productive dirty yellow in color, SOB, wheezing. Using neb tx(helps slightly and rescue inhaler, as well as Tussionex) Hydromet cough syrup is ineffective. Now fifth day of acute illness as described above. Steadily progressive aching. Queasy stomach with diarrhea  11/25/12- 66 yoF former smoker, followed for COPD, hx LLL resection/chemo 0973 NSCCA, complicated by DM, obesity/ lap band surgery FOLLOWS FOR:  Breathing has gotten worse. Reports DOE, dry cough and wheezing. Denies chest pain or chest tightness.  No acute event. CXR 10/ 11 /13 IMPRESSION:  No acute cardiopulmonary disease. No change from the prior study.  Original Report Authenticated By: Lasandra Beech, M.D.is COPD Assessment Test- 11/25/12    19  06/02/13- 66 yoF former smoker, followed for COPD, hx LLL resection/chemo 5329 NSCCA, complicated by DM, obesity/ lap band surgery Follows for- SOB with exertion, frequent urination, some chest discomfort, fatigue.  CAT score 24 Weight up from 228 in Jan to 237 now.  Using Benadryl to sleep that  carries over with some daytime tiredness Increased cough this week without fever or purulent discharge Complains of chest wall and thigh cramps, present for some months. We discussed possible relationship to her statin.  07/17/13- 67 yoF former smoker, followed for COPD, hx LLL resection/chemo 9242 NSCCA, complicated by DM, obesity/ lap band surgery FOLLOWS FOR:  Breathing doing well.  Discuss sleep study Breathing well with no respiratory infection this winter NPSG 07/03/13- AHI 5.3/ hr, weight 234 pounds CXR 06/02/13 IMPRESSION:  Chronic changes as described above. No acute abnormality seen in the  chest.  Electronically Signed  By: Sabino Dick M.D.  On: 06/02/2013 15:49  12/26/13- 67 yoF former smoker, followed for COPD, hx LLL resection/chemo 6834 NSCCA, complicated by DM, obesity/ lap band surgery FOLLOWS FOR: Pt states she has an increase in cough with grey, green mucous with bloody streaks since 6/4. Pt states these s/s started when she went to nail salon. Denies CP/tightness.   Blames smell of acetone at nail salon. Small streak of blood only with very hard cough. Denies chest pain, palpitation, edema, adenopathy. Had uncomplicated breast reduction surgery previously.  06/28/14 69 yoF former smoker, followed for COPD, hx  LLL resection/chemo 2992 NSCCA, complicated by DM, obesity/ lap  band surgery FOLLOWS EQA:STMHD neb machine and albuterol rx to [facility]; rescue inhaler refilled (uses Ventolin but needs something she can get for now for cheaper-Proair with coupon).Will need Spiriva refilled as well. Breathing is doing well as long as no extreme exertion.   CXR 12/26/13 IMPRESSION: Emphysema. No acute abnormalities. Electronically Signed  By: Rozetta Nunnery M.D.  On: 12/26/2013 15:20  07/04/2015-69 year old female former smoker followed for COPD, history left lower lobe resection/chemotherapy 6222 NSCCA, complicated by DM, obesity/lap band surgery Follows for: COPD,OSA-minimal/no CPAP. Pt c/o mostly dry cough, wheeze, SOB with exertion and some CP/tightness when SOB.  Increased dyspnea on exertion last few weeks-possibly related to seasonal weather change. Admits some chest tightness with exertion. We reviewed previous cardiac cath not showing coronary obstruction. She has follow-up with Dr. Einar Gip. Office Spirometry 07/04/2015-mild airway obstruction. FEV1/FVC 0.55  ROS-see HPI Constitutional:   No-   weight loss, night sweats, fevers, chills,+ fatigue, lassitude. HEENT:   No-  headaches, difficulty swallowing, tooth/dental problems, sore throat,       No-  sneezing, itching, ear ache, +nasal congestion, post nasal drip,  CV:  No-   chest pain, orthopnea, PND, swelling in lower extremities, anasarca, dizziness, palpitations Resp: +  shortness of breath with exertion or at rest.        productive cough,  non-productive cough,   coughing up of blood.              No-   change in color of mucus. wheezing.   Skin: No-   rash or lesions. GI:  No-   heartburn, indigestion, abdominal pain, nausea, vomiting, GU: . MS:  No-   joint pain or swelling.   cramping myalgias Neuro-     nothing unusual Psych:  No- change in mood or affect. No depression or anxiety.  No memory loss.  OBJ General- Alert, Oriented, Affect-appropriate, Distress- none acute, +obese Skin- rash-none, lesions-  none, excoriation- none.  Lymphadenopathy- none Head- atraumatic            Eyes- Gross vision intact, PERRLA, conjunctivae clear secretions            Ears- Hearing, canals-normal            Nose- Clear, no-Septal dev, mucus, polyps, erosion, perforation             Throat- Mallampati III-IV , mucosa + red , drainage- none, tonsils- atrophic Neck- flexible , trachea midline, no stridor , thyroid nl, carotid no bruit Chest - symmetrical excursion , unlabored           Heart/CV- RRR , no murmur , no gallop  , no rub, nl s1 s2                           - JVD- none , edema- none, stasis changes- none, varices- none           Lung- clear, wheeze- none, cough-none , dullness-none, rub- none           Chest wall- scar left chest Port-A-Cath Abd-  Br/ Gen/ Rectal- Not done, not indicated Extrem- cyanosis- none, clubbing, none, atrophy- none, strength- nl Neuro- grossly intact to observation

## 2015-07-08 ENCOUNTER — Telehealth: Payer: Self-pay | Admitting: Internal Medicine

## 2015-07-08 NOTE — Telephone Encounter (Signed)
Informed pt of results per CY Nothing further is needed

## 2015-07-08 NOTE — Telephone Encounter (Signed)
[phone], pt cb

## 2015-07-31 ENCOUNTER — Telehealth: Payer: Self-pay | Admitting: Internal Medicine

## 2015-07-31 NOTE — Telephone Encounter (Signed)
Spoke with pt. Reports increased coughing and chest tightness. Cough is producing yellow mucus. Onset was 3 weeks ago. Denies fever, wheezing or SOB. She has been scheduled with Sarah on 08/02/15 at 12pm. Nothing further was needed.

## 2015-07-31 NOTE — Telephone Encounter (Signed)
[phone], pt cb again

## 2015-08-02 ENCOUNTER — Other Ambulatory Visit: Payer: Self-pay | Admitting: Acute Care

## 2015-08-02 ENCOUNTER — Ambulatory Visit (INDEPENDENT_AMBULATORY_CARE_PROVIDER_SITE_OTHER): Payer: Medicare Other | Admitting: Acute Care

## 2015-08-02 ENCOUNTER — Encounter: Payer: Self-pay | Admitting: Acute Care

## 2015-08-02 ENCOUNTER — Ambulatory Visit (INDEPENDENT_AMBULATORY_CARE_PROVIDER_SITE_OTHER)
Admission: RE | Admit: 2015-08-02 | Discharge: 2015-08-02 | Disposition: A | Discharge status: Home / Self Care | Payer: Medicare Other | Source: Ambulatory Visit | Attending: Acute Care | Admitting: Acute Care

## 2015-08-02 ENCOUNTER — Telehealth: Payer: Self-pay | Admitting: Acute Care

## 2015-08-02 VITALS — BP 124/70 | HR 61 | Temp 98.2°F | Wt 216.0 lb

## 2015-08-02 DIAGNOSIS — J440 Chronic obstructive pulmonary disease with acute lower respiratory infection: Secondary | ICD-10-CM | POA: Diagnosis not present

## 2015-08-02 DIAGNOSIS — R05 Cough: Secondary | ICD-10-CM | POA: Diagnosis not present

## 2015-08-02 DIAGNOSIS — J449 Chronic obstructive pulmonary disease, unspecified: Secondary | ICD-10-CM

## 2015-08-02 DIAGNOSIS — J209 Acute bronchitis, unspecified: Secondary | ICD-10-CM

## 2015-08-02 DIAGNOSIS — R059 Cough, unspecified: Secondary | ICD-10-CM

## 2015-08-02 MED ORDER — HYDROCODONE-CHLORPHENIRAMINE 5-4 MG/5ML PO SOLN: 5.0000 mL | Freq: Two times a day (BID) | ORAL | Status: DC | PRN

## 2015-08-02 MED ORDER — AZITHROMYCIN 250 MG PO TABS
250.0000 mg | ORAL_TABLET | ORAL | Status: DC
Start: 2015-08-02 — End: 2016-01-06

## 2015-08-02 MED ORDER — PREDNISONE 10 MG PO TABS: ORAL_TABLET | ORAL | Status: DC

## 2015-08-02 NOTE — Patient Instructions (Addendum)
Continue to take your Mucinex DM syrup We will start Z-Pack. While on antibiotic take a probiotic OTC or eat Activia Yogurt We will get a chest x ray today to make sure you are not developing a pneumonia. I will call with the results. Prednisone taper, 10 mg tablets, 4 tabs x 2 days, 3 tabs x 2 days, 2 tabs x 2 days, then 1 tab x 2 days, then stop. Hydrocodone-Chlorphen ER Suspension. Take 5 mls. Every 12 hours. Don't drive if sleepy. Follow up with me in  2 weeks. Nasal Saline Gel for nasal stuffiness Try Flonase 2 sprays every 12 hours for nasal stuffiness. Please contact office for sooner follow up if symptoms do not improve or worsen or seek emergency care .

## 2015-08-02 NOTE — Telephone Encounter (Signed)
I spoke with patient about results and she verbalized understanding and had no questions 

## 2015-08-02 NOTE — Assessment & Plan Note (Signed)
Pt returns to the office today with continued SOB on exertion, yellow to brown thick sputum, and cough , sore throat, nasal congestion. She states it started as a cold and has moved to her chest. This has been ongoing for about 4 weeks. No fever,Denies chest pain, palpitation, edema, adenopathy.  Continue to take your Mucinex DM syrup We will start Z-Pack. While on antibiotic take a probiotic OTC or eat Activia Yogurt We will get a chest x ray today to make sure you are not developing a pneumonia. I will call with the results. Prednisone taper, 10 mg tablets, 4 tabs x 2 days, 3 tabs x 2 days, 2 tabs x 2 days, then 1 tab x 2 days, then stop. Hydrocodone-Chlorphen ER Suspension for cough. Take 5 mls. Every 12 hours. Don't drive if sleepy. Follow up with me in  2 weeks. Nasal Saline Gel for nasal stuffiness Try Flonase 2 sprays every 12 hours for nasal stuffiness. Please contact office for sooner follow up if symptoms do not improve or worsen or seek emergency care .

## 2015-08-02 NOTE — Progress Notes (Signed)
Subjective:    Patient ID: [name], female    DOB: 1946/06/15, 70 y.o.   MRN: [id number]  HPI 70 year old female former smoker followed for COPD mixed type, history left lower lobe resection/chemotherapy 4098 NSCCA, complicated by DM, obesity/lap band surgery Follows for: COPD,OSA-minimal/no CPAP, mild to moderate obstructive disease. Significant Studies:  08/02/15: CXR: No acute cardiopulmonary disease. 2. Stable postsurgical changes on the left. No change from the prior chest radiograph.  08/02/15: Acute OV: Pt states she has  SOB on exertion and increase in cough and yellow sputum, sore throat, nasal congestion and chest congestion. She states that this has been going on for 3 weeks and is not getting better. She denies fever, chills,othopnea,or hemoptysis.Cough is preventing her from sleeping.  No Known Allergies  Past Medical History  Diagnosis Date  . Diabetes mellitus   . COPD (chronic obstructive pulmonary disease) ([place])   . Arthritis   . Phlebitis   . DJD (degenerative joint disease)   . Hernia   . Bruises easily   . Wears glasses   . Gum disease   . Breast pain in female   . Abdominal pain     around naval  . Weight increase   . Cancer ([place]) 2003    lung  . Thyroid disease   . Chronic depression   . Cervical disc disease   . Insomnia     Current outpatient prescriptions:  .  albuterol (PROVENTIL) (2.5 MG/3ML) 0.083% nebulizer solution, Use 1 vial 3 times daily as needed DX 496, Disp: 300 mL, Rfl: PRN .  Albuterol Sulfate (PROAIR RESPICLICK) 119 (90 BASE) MCG/ACT AEPB, Inhale 2 puffs into the lungs every 4 (four) hours as needed., Disp: 1 each, Rfl: prn .  ALPRAZolam (XANAX) 0.25 MG tablet, Take 0.25 mg by mouth at bedtime as needed. For sleep, Disp: , Rfl:  .  aspirin EC 81 MG EC tablet, Take 1 tablet (81 mg total) by mouth daily., Disp: , Rfl:  .  cetirizine (ZYRTEC) 10 MG tablet, Take 10 mg by mouth daily., Disp: , Rfl:  .  cholecalciferol  (VITAMIN D) 1000 units tablet, Take 1,000 Units by mouth daily., Disp: , Rfl:  .  diphenhydrAMINE (BENADRYL) 25 mg capsule, Take 25 mg by mouth daily as needed. For itching, Disp: , Rfl:  .  fluvastatin (LESCOL) 20 MG capsule, Take 1 capsule by mouth daily., Disp: , Rfl: 30 .  metoprolol tartrate (LOPRESSOR) 25 MG tablet, 1/2 tablet twice daily, Disp: , Rfl:  .  Multiple Vitamin (MULTIVITAMIN) tablet, Take 1 tablet by mouth daily., Disp: , Rfl:  .  Nebulizers (COMPRESSOR/NEBULIZER) MISC, Use as directed, Disp: 1 each, Rfl: prn .  tiotropium (SPIRIVA) 18 MCG inhalation capsule, Place 1 capsule (18 mcg total) into inhaler and inhale daily., Disp: 30 capsule, Rfl: prn .  azithromycin (ZITHROMAX) 250 MG tablet, Take 1 tablet (250 mg total) by mouth as directed., Disp: 6 tablet, Rfl: 0 .  Hydrocodone-Chlorpheniramine 5-4 MG/5ML SOLN, Take 5 mLs by mouth every 12 (twelve) hours as needed (cough)., Disp: 200 mL, Rfl: 0 .  HYDROcodone-homatropine (HYCODAN) 5-1.5 MG/5ML syrup, Take 5 mLs by mouth every 6 (six) hours as needed for cough. (Patient not taking: Reported on 08/02/2015), Disp: 180 mL, Rfl: 0 .  predniSONE (DELTASONE) 10 MG tablet, Take 4 tabs po x 2 days, then 3 x 2 days, then 2 x 2 days, then 1 x 2 days then stop., Disp: 20 tablet, Rfl: 0 Review of Systems  Bold text indicates positive  Constitutional:   No  weight loss, night sweats,  Fevers, chills, fatigue, or  lassitude.  HEENT:   No headaches,  Difficulty swallowing,  Tooth/dental problems, or  Sore throat,                No sneezing, itching, ear ache, nasal congestion, post nasal drip,   CV:  No chest pain,  Orthopnea, PND, swelling in lower extremities, anasarca, dizziness, palpitations, syncope.   GI  No heartburn, indigestion, abdominal pain, nausea, vomiting, diarrhea, change in bowel habits, loss of appetite, bloody stools.   Resp:  shortness of breath with exertion not at rest.   excess mucus,  productive cough,  No  non-productive cough,  No coughing up of blood. change in color of mucus.   wheezing.  No chest wall deformity  Skin: no rash or lesions.  GU: no dysuria, change in color of urine, no urgency or frequency.  No flank pain, no hematuria   MS:  No joint pain or swelling.  No decreased range of motion.  No back pain.  Psych:  No change in mood or affect. No depression or anxiety.  No memory loss.          Objective:   Physical Exam  BP 124/70 mmHg  Pulse 61  Temp(Src) 98.2 F (36.8 C) (Oral)  Wt 216 lb (97.977 kg)  SpO2 96%  Physical Exam:  General- No distress,  A&Ox3 ENT: slight sinus tenderness, TM clear, pale nasal mucosa, no oral exudate, post nasal drip, no LAN, Cough Cardiac: S1, S2, regular rate and rhythm, no murmur Chest: + wheeze/ rales/ dullness; no accessory muscle use, no nasal flaring, no sternal retractions Abd.: Soft Non-tender Ext: No edema Neuro:  normal strength Skin: No rashes, warm and dry Psych: normal mood and behavior     Assessment & Plan:

## 2015-08-02 NOTE — Assessment & Plan Note (Signed)
Continued cough with sinus congestion, chest congestion.  Plan:  Hydrocodone-chlorphen ER Suspension: take 1 teaspoon ( 5cc's) every 12 hours. Counseled on not driving if sleepy.

## 2015-08-02 NOTE — Telephone Encounter (Signed)
LM x 1  Notes Recorded by Magdalen Spatz, NP on 08/02/2015 at 1:33 PM Please call Ms. Corpuz and let he know her CXR was fine.To take the Z-Pack and prednisone taper as we prescribed, and we will see her back in 2 weeks or sooner as needed.The number she gave me was [phone], which is the [facility]. I called earlier and they told me she won't be there until after 3pm. Thanks so much.

## 2015-08-05 ENCOUNTER — Other Ambulatory Visit: Payer: Self-pay | Admitting: Internal Medicine

## 2015-08-16 ENCOUNTER — Ambulatory Visit: Payer: Medicare Other | Admitting: Acute Care

## 2016-01-06 ENCOUNTER — Ambulatory Visit (INDEPENDENT_AMBULATORY_CARE_PROVIDER_SITE_OTHER): Payer: BC Managed Care – PPO | Admitting: Internal Medicine

## 2016-01-06 ENCOUNTER — Encounter: Payer: Self-pay | Admitting: Internal Medicine

## 2016-01-06 VITALS — BP 130/74 | HR 59 | Ht 60.0 in | Wt 214.6 lb

## 2016-01-06 DIAGNOSIS — J449 Chronic obstructive pulmonary disease, unspecified: Secondary | ICD-10-CM | POA: Diagnosis not present

## 2016-01-06 DIAGNOSIS — Z85118 Personal history of other malignant neoplasm of bronchus and lung: Secondary | ICD-10-CM

## 2016-01-06 MED ORDER — HYDROCODONE-CHLORPHENIRAMINE 5-4 MG/5ML PO SOLN: 5.0000 mL | Freq: Two times a day (BID) | ORAL | Status: DC | PRN

## 2016-01-06 NOTE — Patient Instructions (Signed)
Script for cough syrup to hold   Suggest you restart your Spiriva and see if that helps the shortness of breath with exertion.  Please call as needed

## 2016-01-06 NOTE — Progress Notes (Signed)
05/10/11- 76 yoF former smoker, followed for COPD, hx LLL resection/chemo 6734 NSCCA, complicated by DM, obesity/ lap band surgery. Last here- 06/27/10 She feels "80% better" since last here. Has been able to lose a little weight which helped. No more hemoptysis. Using pro-air rescue inhaler only if she exerts herself a lot and continues maintenance Spiriva. No cough or recent wheeze. CXR 06/27/2010-NAD, clear.  05/24/12- 33 yoF former smoker, followed for COPD, hx LLL resection/chemo 1937 NSCCA, complicated by DM, obesity/ lap band surgery. States that breathing has not been good lately. lots of sob, wheezing and cough. feels fatigued all the time. Little effect from her rescue inhaler. Easy dyspnea walking across parking lot. Post hospital visit-04/29/2012 2 05/02/2012 by Dr.Ganji/ cardiology for unstable angina. Clear coronary arteries on cath. Was not anemic. Reports increased sharp pains in chest. Has felt better since fluid was removed from her lap band. COPD assessment test (CABG) score 22/40 PFT 07/05/2008-moderate obstructive airways disease with response to bronchodilator, diffusion moderately reduced. FEV1 1.75/87%, FEV1/FVC 0.53, DLCO 49% CXR 04/29/12-reviewed IMPRESSION:  No acute cardiopulmonary disease. No change from the prior study.  Original Report Authenticated By: Lasandra Beech, M.D.   07/22/12- 3 yoF former smoker, followed for COPD, hx LLL resection/chemo 9024 NSCCA, complicated by DM, obesity/ lap band surgery. FOLLOWS FOR: Acute illness-this fever 100.6 highest; cough-productive dirty yellow in color, SOB, wheezing. Using neb tx(helps slightly and rescue inhaler, as well as Tussionex) Hydromet cough syrup is ineffective. Now fifth day of acute illness as described above. Steadily progressive aching. Queasy stomach with diarrhea  11/25/12- 66 yoF former smoker, followed for COPD, hx LLL resection/chemo 0973 NSCCA, complicated by DM, obesity/ lap band surgery FOLLOWS FOR:  Breathing has gotten worse. Reports DOE, dry cough and wheezing. Denies chest pain or chest tightness.  No acute event. CXR 10/ 11 /13 IMPRESSION:  No acute cardiopulmonary disease. No change from the prior study.  Original Report Authenticated By: Lasandra Beech, M.D.is COPD Assessment Test- 11/25/12    19  06/02/13- 66 yoF former smoker, followed for COPD, hx LLL resection/chemo 5329 NSCCA, complicated by DM, obesity/ lap band surgery Follows for- SOB with exertion, frequent urination, some chest discomfort, fatigue.  CAT score 24 Weight up from 228 in Jan to 237 now.  Using Benadryl to sleep that  carries over with some daytime tiredness Increased cough this week without fever or purulent discharge Complains of chest wall and thigh cramps, present for some months. We discussed possible relationship to her statin.  07/17/13- 67 yoF former smoker, followed for COPD, hx LLL resection/chemo 9242 NSCCA, complicated by DM, obesity/ lap band surgery FOLLOWS FOR:  Breathing doing well.  Discuss sleep study Breathing well with no respiratory infection this winter NPSG 07/03/13- AHI 5.3/ hr, weight 234 pounds CXR 06/02/13 IMPRESSION:  Chronic changes as described above. No acute abnormality seen in the  chest.  Electronically Signed  By: Sabino Dick M.D.  On: 06/02/2013 15:49  12/26/13- 67 yoF former smoker, followed for COPD, hx LLL resection/chemo 6834 NSCCA, complicated by DM, obesity/ lap band surgery FOLLOWS FOR: Pt states she has an increase in cough with grey, green mucous with bloody streaks since 6/4. Pt states these s/s started when she went to nail salon. Denies CP/tightness.   Blames smell of acetone at nail salon. Small streak of blood only with very hard cough. Denies chest pain, palpitation, edema, adenopathy. Had uncomplicated breast reduction surgery previously.  06/28/14 7 yoF former smoker, followed for COPD, hx  LLL resection/chemo 5427 NSCCA, complicated by DM, obesity/ lap  band surgery FOLLOWS CWC:BJSEG neb machine and albuterol rx to [facility]; rescue inhaler refilled (uses Ventolin but needs something she can get for now for cheaper-Proair with coupon).Will need Spiriva refilled as well. Breathing is doing well as long as no extreme exertion.   CXR 12/26/13 IMPRESSION: Emphysema. No acute abnormalities. Electronically Signed  By: Rozetta Nunnery M.D.  On: 12/26/2013 15:20  07/04/2015-70 year old female former smoker followed for COPD, history left lower lobe resection/chemotherapy 3151 NSCCA, complicated by DM, obesity/lap band surgery Follows for: COPD,OSA-minimal/no CPAP. Pt c/o mostly dry cough, wheeze, SOB with exertion and some CP/tightness when SOB.  Increased dyspnea on exertion last few weeks-possibly related to seasonal weather change. Admits some chest tightness with exertion. We reviewed previous cardiac cath not showing coronary obstruction. She has follow-up with Dr. Einar Gip. Office Spirometry 07/04/2015-mild airway obstruction. FEV1/FVC 0.55  01/06/2016-70 year old female former smoker followed for COPD, history left lower lobe resection/chemotherapy 7616 NSCCa, complicated by DM, obesity/lap band surgery lov 1/13- treated for acute bronchitis with Z-Pak, prednisone taper, cough syrup, Flonase FOLLOWS FOR: Pt would like to have cough syrup Rx that Maggie Schwalbe gave last--Not Hycodan. Pt feeling much better since last OV with SG.  Pt then noted slight wheezing sounds at night on some occasions. Occasional cough with irritant exposure like strong odors and temperature changes. Woke from sleep once "gurgling" but doesn't recognize reflux. Gradually think she's becoming more short of breath with exertion such as hills and stairs. No acute events. She had dropped off Spiriva but is willing to retry. CXR 08/02/2015 IMPRESSION: 1. No acute cardiopulmonary disease. 2. Stable postsurgical changes on the left. No change from the prior chest  radiograph. Electronically Signed  By: Lajean Manes M.D.  On: 08/02/2015 13:16  ROS-see HPI Constitutional:   No-   weight loss, night sweats, fevers, chills,+ fatigue, lassitude. HEENT:   No-  headaches, difficulty swallowing, tooth/dental problems, sore throat,       No-  sneezing, itching, ear ache, +nasal congestion, post nasal drip,  CV:  No-   chest pain, orthopnea, PND, swelling in lower extremities, anasarca, dizziness, palpitations Resp: +  shortness of breath with exertion or at rest.        productive cough,  non-productive cough,   coughing up of blood.              No-   change in color of mucus. wheezing.   Skin: No-   rash or lesions. GI:  No-   heartburn, indigestion, abdominal pain, nausea, vomiting, GU: . MS:  No-   joint pain or swelling.   cramping myalgias Neuro-     nothing unusual Psych:  No- change in mood or affect. No depression or anxiety.  No memory loss.  OBJ General- Alert, Oriented, Affect-appropriate, Distress- none acute, +obese Skin- rash-none, lesions- none, excoriation- none.  Lymphadenopathy- none Head- atraumatic            Eyes- Gross vision intact, PERRLA, conjunctivae clear secretions            Ears- Hearing, canals-normal            Nose- Clear, no-Septal dev, mucus, polyps, erosion, perforation             Throat- Mallampati III-IV , mucosa + red , drainage- none, tonsils- atrophic Neck- flexible , trachea midline, no stridor , thyroid nl, carotid no bruit Chest - symmetrical excursion , unlabored  Heart/CV- RRR , no murmur , no gallop  , no rub, nl s1 s2                           - JVD- none , edema- none, stasis changes- none, varices- none           Lung- clear, wheeze- none, cough-none , dullness-none, rub- none           Chest wall- scar left chest Port-A-Cath Abd-  Br/ Gen/ Rectal- Not done, not indicated Extrem- cyanosis- none, clubbing, none, atrophy- none, strength- nl Neuro- grossly intact to  observation

## 2016-01-07 NOTE — Assessment & Plan Note (Signed)
Clinically in remission, followed by Oncology

## 2016-01-07 NOTE — Assessment & Plan Note (Signed)
Some chronic cough and exertional dyspnea which may have begun when she dropped Spiriva. Plan-restart Spiriva. Refill cough syrup.

## 2016-01-07 NOTE — Assessment & Plan Note (Signed)
She would still benefit from further weight loss and is encouraged to try

## 2016-01-13 DIAGNOSIS — F419 Anxiety disorder, unspecified: Secondary | ICD-10-CM | POA: Diagnosis not present

## 2016-01-13 DIAGNOSIS — R252 Cramp and spasm: Secondary | ICD-10-CM | POA: Diagnosis not present

## 2016-01-13 DIAGNOSIS — R5382 Chronic fatigue, unspecified: Secondary | ICD-10-CM | POA: Diagnosis not present

## 2016-01-13 DIAGNOSIS — E118 Type 2 diabetes mellitus with unspecified complications: Secondary | ICD-10-CM | POA: Diagnosis not present

## 2016-01-13 DIAGNOSIS — J449 Chronic obstructive pulmonary disease, unspecified: Secondary | ICD-10-CM | POA: Diagnosis not present

## 2016-02-18 DIAGNOSIS — H35363 Drusen (degenerative) of macula, bilateral: Secondary | ICD-10-CM | POA: Diagnosis not present

## 2016-02-18 DIAGNOSIS — D3131 Benign neoplasm of right choroid: Secondary | ICD-10-CM | POA: Diagnosis not present

## 2016-02-18 DIAGNOSIS — E119 Type 2 diabetes mellitus without complications: Secondary | ICD-10-CM | POA: Diagnosis not present

## 2016-02-25 ENCOUNTER — Other Ambulatory Visit: Payer: Self-pay | Admitting: Specialist

## 2016-02-25 ENCOUNTER — Telehealth: Payer: Self-pay | Admitting: Internal Medicine

## 2016-02-25 DIAGNOSIS — R5381 Other malaise: Secondary | ICD-10-CM | POA: Diagnosis not present

## 2016-02-25 DIAGNOSIS — I10 Essential (primary) hypertension: Secondary | ICD-10-CM | POA: Diagnosis not present

## 2016-02-25 DIAGNOSIS — E7800 Pure hypercholesterolemia, unspecified: Secondary | ICD-10-CM | POA: Diagnosis not present

## 2016-02-25 DIAGNOSIS — R5383 Other fatigue: Secondary | ICD-10-CM | POA: Diagnosis not present

## 2016-02-25 DIAGNOSIS — E119 Type 2 diabetes mellitus without complications: Secondary | ICD-10-CM | POA: Diagnosis not present

## 2016-02-25 NOTE — Telephone Encounter (Signed)
Received request for records from [facility]. I faxed 8 pages to them and received confirmation from the fax that they received the 8 pages

## 2016-02-27 ENCOUNTER — Telehealth: Payer: Self-pay | Admitting: Adult Health

## 2016-02-27 NOTE — Telephone Encounter (Signed)
Received paper from [facility] for patient to receive surgical clearance for bariatric surgery.  Spoke with TP about surgical clearance  Okay to place on TP's schedule in a 30 minute slot Arlyce Harman must be done at ov  LVM for pt to return call.

## 2016-02-28 NOTE — Telephone Encounter (Signed)
Pt cb, appt scheduled for surgical clearance w/spiro for 03/06/16 @ 930am w/TP, nothing further needed

## 2016-03-06 ENCOUNTER — Encounter: Payer: Self-pay | Admitting: Adult Health

## 2016-03-06 ENCOUNTER — Ambulatory Visit (INDEPENDENT_AMBULATORY_CARE_PROVIDER_SITE_OTHER)
Admission: RE | Admit: 2016-03-06 | Discharge: 2016-03-06 | Disposition: A | Discharge status: Home / Self Care | Payer: Medicare Other | Source: Ambulatory Visit | Attending: Adult Health | Admitting: Adult Health

## 2016-03-06 ENCOUNTER — Ambulatory Visit (INDEPENDENT_AMBULATORY_CARE_PROVIDER_SITE_OTHER): Payer: Medicare Other | Admitting: Adult Health

## 2016-03-06 VITALS — BP 122/80 | HR 57 | Temp 97.9°F | Ht 60.0 in | Wt 218.0 lb

## 2016-03-06 DIAGNOSIS — J449 Chronic obstructive pulmonary disease, unspecified: Secondary | ICD-10-CM

## 2016-03-06 DIAGNOSIS — Z01818 Encounter for other preprocedural examination: Secondary | ICD-10-CM | POA: Diagnosis not present

## 2016-03-06 NOTE — Assessment & Plan Note (Signed)
Mild to Moderate COPD well controlled on Spiriva   Plan  Patient Instructions  You have a moderate surgical risk from pulmonary standpoint .  Chest xray today  Continue on Spiriva daily  Follow up Dr. Annamaria Boots  In 4-6 months and As needed

## 2016-03-06 NOTE — Progress Notes (Signed)
Subjective:    Patient ID: [name], female    DOB: Jul 23, 1945, 70 y.o.   MRN: [id number]  HPI 70 year old female former smoker followed for COPD with Dr. Annamaria Boots. She has a history of a left lower lobe resection and chemotherapy in 2003 for non-small cell lung cancer. History of obesity status post lap band surgery.   03/06/2016 Follow up : COPD , surgical clearance She presents for a follow-up for COPD. She also will be undergoing gastric bypass surgery and requires pulmonary surgical clearance.  Patient says overall she is doing well with her breathing. She denies any shortness of breath, wheezing, or increased cough. It is not on any oxygen, and O2 saturation today is 97% on room air. She says that she is active, does housework. Says can not walk long distance due to dyspnea.  Simple spirometry today shows mild COPD with an FEV1 at 78%, ratio 58, FVC 104%. Takes Spiriva daily .  Chest x-ray in January 2017 showed no acute process with stable postsurgical changes on the left. Had lap bad surgery in 2008 . Lost around 60lbs. But wants to lose more and has trouble with lap band with taking her meds.  No recent abx or steroids, last 07/2015 .  Dr. Darnell Level with [facility] surgeon on [place].  Has very  mild OSA with 2014 PSG at ~5.3/hr (wt 234lbs). Did not require CPAP . Recommended weight loss.  Has DM , diet controlled.    Past Medical History:  Diagnosis Date  . Abdominal pain    around naval  . Arthritis   . Breast pain in female   . Bruises easily   . Cancer ([place]) 2003   lung  . Cervical disc disease   . Chronic depression   . COPD (chronic obstructive pulmonary disease) ([place])   . Diabetes mellitus   . DJD (degenerative joint disease)   . Gum disease   . Hernia   . Insomnia   . Phlebitis   . Thyroid disease   . Wears glasses   . Weight increase    Current Outpatient Prescriptions on File Prior to Visit  Medication Sig Dispense Refill  . albuterol  (PROVENTIL) (2.5 MG/3ML) 0.083% nebulizer solution Use 1 vial 3 times daily as needed DX 496 300 mL PRN  . Albuterol Sulfate (PROAIR RESPICLICK) 269 (90 BASE) MCG/ACT AEPB Inhale 2 puffs into the lungs every 4 (four) hours as needed. 1 each prn  . ALPRAZolam (XANAX) 0.25 MG tablet Take 0.25 mg by mouth at bedtime as needed. For sleep    . aspirin EC 81 MG EC tablet Take 1 tablet (81 mg total) by mouth daily.    . cetirizine (ZYRTEC) 10 MG tablet Take 10 mg by mouth daily.    . cholecalciferol (VITAMIN D) 1000 units tablet Take 1,000 Units by mouth daily.    . diphenhydrAMINE (BENADRYL) 25 mg capsule Take 25 mg by mouth daily as needed. For itching    . fluvastatin (LESCOL) 20 MG capsule Take 1 capsule by mouth daily.  30  . Hydrocodone-Chlorpheniramine 5-4 MG/5ML SOLN Take 5 mLs by mouth every 12 (twelve) hours as needed (cough). 200 mL 0  . metoprolol tartrate (LOPRESSOR) 25 MG tablet 1/2 tablet twice daily    . Multiple Vitamin (MULTIVITAMIN) tablet Take 1 tablet by mouth daily.    . Nebulizers (COMPRESSOR/NEBULIZER) MISC Use as directed 1 each prn  . SPIRIVA HANDIHALER 18 MCG inhalation capsule PLACE 1 CAPSULE INTO INHALER AND INHALE  DAILY. 30 capsule 3   No current facility-administered medications on file prior to visit.       Review of Systems  Constitutional:   No  weight loss, night sweats,  Fevers, chills, fatigue, or  lassitude.  HEENT:   No headaches,  Difficulty swallowing,  Tooth/dental problems, or  Sore throat,                No sneezing, itching, ear ache, nasal congestion, post nasal drip,   CV:  No chest pain,  Orthopnea, PND, swelling in lower extremities, anasarca, dizziness, palpitations, syncope.   GI  No heartburn, indigestion, abdominal pain, nausea, vomiting, diarrhea, change in bowel habits, loss of appetite, bloody stools.   Resp:  .  No excess mucus, no productive cough,  No non-productive cough,  No coughing up of blood.  No change in color of mucus.  No  wheezing.  No chest wall deformity  Skin: no rash or lesions.  GU: no dysuria, change in color of urine, no urgency or frequency.  No flank pain, no hematuria   MS:  No joint pain or swelling.  No decreased range of motion.  No back pain.  Psych:  No change in mood or affect. No depression or anxiety.  No memory loss.          Objective:   Physical Exam  Vitals:   03/06/16 0942  BP: 122/80  Pulse: (!) 57  Temp: 97.9 F (36.6 C)  TempSrc: Oral  SpO2: 97%  Weight: 218 lb (98.9 kg)  Height: 5' (1.524 m)  Body mass index is 42.58 kg/m.  GEN: A/Ox3; pleasant , NAD, obese    HEENT:  [place]/AT,  EACs-clear, TMs-wnl, NOSE-clear, THROAT-clear, no lesions, no postnasal drip or exudate noted.   NECK:  Supple w/ fair ROM; no JVD; normal carotid impulses w/o bruits; no thyromegaly or nodules palpated; no lymphadenopathy.    RESP  Clear  P & A; w/o, wheezes/ rales/ or rhonchi. no accessory muscle use, no dullness to percussion  CARD:  RRR, no m/r/g  , no peripheral edema, pulses intact, no cyanosis or clubbing.  GI:   Soft & nt; nml bowel sounds; no organomegaly or masses detected.   Musco: Warm bil, no deformities or joint swelling noted.   Neuro: alert, no focal deficits noted.    Skin: Warm, no lesions or rashes  Annisha Baar NP-C   Pulmonary and Critical Care  03/06/2016

## 2016-03-06 NOTE — Assessment & Plan Note (Signed)
Pulmonary Preop surgical clearance for upcoming gastric bypass surgery  She has controlled COPD with Mild to moderate airflow obstruction  Along with her age, COPD and Mild OSA does place her at a moderate surgical risk but does not exclude her from surgery.  I have discussed the possible potential pulmonary complications and she is aware of risk.    Major Pulmonary risks identified in the multifactorial risk analysis are but not limited to a) pneumonia; b) recurrent intubation risk; c) prolonged or recurrent acute respiratory failure needing mechanical ventilation; d) prolonged hospitalization; e) DVT/Pulmonary embolism; f) Acute Pulmonary edema  Recommend : Pulmonary consult as needed.  1. Short duration of surgery as much as possible and avoid paralytic if possible 2. Recovery in step down or ICU with Pulmonary consultation if needed  3. DVT prophylaxis 4. Aggressive pulmonary toilet with o2, bronchodilatation, and incentive spirometry and early ambulation   Case discussed with Dr. Annamaria Boots  .

## 2016-03-06 NOTE — Patient Instructions (Signed)
You have a moderate surgical risk from pulmonary standpoint .  Chest xray today  Continue on Spiriva daily  Follow up Dr. Annamaria Boots  In 4-6 months and As needed

## 2016-03-09 ENCOUNTER — Ambulatory Visit (HOSPITAL_COMMUNITY)
Admission: RE | Admit: 2016-03-09 | Discharge: 2016-03-09 | Disposition: A | Discharge status: Home / Self Care | Payer: Medicare Other | Source: Ambulatory Visit | Attending: Specialist | Admitting: Specialist

## 2016-03-09 ENCOUNTER — Telehealth: Payer: Self-pay | Admitting: Adult Health

## 2016-03-09 DIAGNOSIS — I10 Essential (primary) hypertension: Secondary | ICD-10-CM | POA: Diagnosis not present

## 2016-03-09 DIAGNOSIS — K760 Fatty (change of) liver, not elsewhere classified: Secondary | ICD-10-CM | POA: Diagnosis not present

## 2016-03-09 DIAGNOSIS — E7800 Pure hypercholesterolemia, unspecified: Secondary | ICD-10-CM | POA: Insufficient documentation

## 2016-03-09 NOTE — Telephone Encounter (Signed)
[name], NP  Katherine Roan Cox, CMA        CXR is clear cont w/ ov recs  ---  I spoke with patient about results and she verbalized understanding and had no questions

## 2016-04-16 DIAGNOSIS — E119 Type 2 diabetes mellitus without complications: Secondary | ICD-10-CM | POA: Diagnosis not present

## 2016-04-16 DIAGNOSIS — Z713 Dietary counseling and surveillance: Secondary | ICD-10-CM | POA: Diagnosis not present

## 2016-04-20 ENCOUNTER — Telehealth: Payer: Self-pay | Admitting: Adult Health

## 2016-04-20 NOTE — Telephone Encounter (Signed)
Spoke with pt and advised that OV note was routed to Dr Darnell Level via [facility] Pro[facility]cal. This included her surgical clearance information. She will let us know if they need anything else. Nothing further needed at this time.

## 2016-05-05 DIAGNOSIS — I10 Essential (primary) hypertension: Secondary | ICD-10-CM | POA: Diagnosis not present

## 2016-05-05 DIAGNOSIS — Z01812 Encounter for preprocedural laboratory examination: Secondary | ICD-10-CM | POA: Diagnosis not present

## 2016-05-05 DIAGNOSIS — R638 Other symptoms and signs concerning food and fluid intake: Secondary | ICD-10-CM | POA: Diagnosis not present

## 2016-05-05 DIAGNOSIS — R5383 Other fatigue: Secondary | ICD-10-CM | POA: Diagnosis not present

## 2016-05-05 DIAGNOSIS — Z5181 Encounter for therapeutic drug level monitoring: Secondary | ICD-10-CM | POA: Diagnosis not present

## 2016-05-05 DIAGNOSIS — R5381 Other malaise: Secondary | ICD-10-CM | POA: Diagnosis not present

## 2016-05-11 ENCOUNTER — Encounter: Payer: Self-pay | Admitting: Cardiology

## 2016-05-11 ENCOUNTER — Encounter (INDEPENDENT_AMBULATORY_CARE_PROVIDER_SITE_OTHER): Payer: Self-pay

## 2016-05-11 ENCOUNTER — Ambulatory Visit (INDEPENDENT_AMBULATORY_CARE_PROVIDER_SITE_OTHER): Payer: Medicare Other | Admitting: Cardiology

## 2016-05-11 VITALS — BP 134/86 | HR 64 | Ht 60.0 in | Wt 217.6 lb

## 2016-05-11 DIAGNOSIS — Z01810 Encounter for preprocedural cardiovascular examination: Secondary | ICD-10-CM | POA: Diagnosis not present

## 2016-05-11 DIAGNOSIS — E7800 Pure hypercholesterolemia, unspecified: Secondary | ICD-10-CM

## 2016-05-11 DIAGNOSIS — I10 Essential (primary) hypertension: Secondary | ICD-10-CM | POA: Diagnosis not present

## 2016-05-11 NOTE — Patient Instructions (Signed)
Medication Instructions:  The current medical regimen is effective;  continue present plan and medications.  Follow-Up: Follow up as needed with Dr Marlou Porch.  If you need a refill on your cardiac medications before your next appointment, please call your pharmacy.  Thank you for choosing [facility]!!

## 2016-05-11 NOTE — Progress Notes (Signed)
Cardiology Office Note    Date:  05/11/2016   ID:  MILDRETH REEK, DOB 02/24/1946, MRN [id number]  PCP:  Maggie Font, MD  Cardiologist:   Candee Furbish, MD     History of Present Illness:  [name] is a 70 y.o. female here for pre op bariatric surgery evaluation at the request of Dr. Darnell Level.   Has COPD, FEV1 at 78% (mild to moderate in review of Rexene Edison, NP note). hx LLL resection/chemo 2003 [facility]CCA. Had lap bad surgery in 2008 with 60lbs weight loss.  She wants to lose more and has trouble with lap band with taking her meds.   Mild OSA - no CPAP required.   DM, essential hypertension.   Had left heart cath on 05/02/12 by Dr. Einar Gip with normal coronary arteries. Indication was abnormal ECG.   Overall she feels well, no orthopnea, no PND, no chest pain, no syncope, no bleeding, mild shortness of breath with exertion which is at baseline from her COPD.  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   Past Medical History:  Diagnosis Date  . Abdominal pain    around naval  . Arthritis   . Breast pain in female   . Bruises easily   . Cancer ([place]) 2003   lung  . Cervical disc disease   . Chronic depression   . COPD (chronic obstructive pulmonary disease) ([place])   . Diabetes mellitus   . DJD (degenerative joint disease)   . Gum disease   . Hernia   . Insomnia   . Phlebitis   . Thyroid disease   . Wears glasses   . Weight increase     Past Surgical History:  Procedure Laterality Date  . ABDOMINAL HYSTERECTOMY     partial  . FOOT SURGERY     right  . LAPAROSCOPIC GASTRIC BANDING  Feb 2010  . LEFT HEART CATHETERIZATION WITH CORONARY ANGIOGRAM N/A 05/02/2012   Procedure: LEFT HEART CATHETERIZATION WITH CORONARY ANGIOGRAM;  Surgeon: Laverda Page, MD;  Location: [facility] CATH LAB;  Service: Cardiovascular;  Laterality: N/A;  . LUNG  LOBECTOMY  2003   left  . SPINE SURGERY     fusion on the neck  . THYROIDECTOMY     partial  . TONSILLECTOMY      Current Medications: Outpatient Medications Prior to Visit  Medication Sig Dispense Refill  . albuterol (PROVENTIL) (2.5 MG/3ML) 0.083% nebulizer solution Use 1 vial 3 times daily as needed DX 496 300 mL PRN  . Albuterol Sulfate (PROAIR RESPICLICK) 202 (90 BASE) MCG/ACT AEPB Inhale 2 puffs into the lungs every 4 (four) hours as needed. 1 each prn  . ALPRAZolam (XANAX) 0.25 MG tablet Take 0.25 mg by mouth at bedtime as needed. For sleep    . cetirizine (ZYRTEC) 10 MG tablet Take 10 mg by mouth daily.    . cholecalciferol (VITAMIN D) 1000 units tablet Take 1,000 Units by mouth daily.    . diphenhydrAMINE (BENADRYL) 25 mg capsule Take 25 mg by mouth daily as needed. For itching    . fluvastatin (LESCOL) 20 MG capsule Take 1 capsule by mouth daily.  30  . Hydrocodone-Chlorpheniramine 5-4 MG/5ML SOLN Take 5 mLs by mouth every 12 (twelve) hours as needed (cough). 200 mL 0  . Multiple Vitamin (MULTIVITAMIN) tablet Take 1 tablet by mouth daily.    . Nebulizers (COMPRESSOR/NEBULIZER)  MISC Use as directed 1 each prn  . SPIRIVA HANDIHALER 18 MCG inhalation capsule PLACE 1 CAPSULE INTO INHALER AND INHALE DAILY. 30 capsule 3  . aspirin EC 81 MG EC tablet Take 1 tablet (81 mg total) by mouth daily.    . metoprolol tartrate (LOPRESSOR) 25 MG tablet 1/2 tablet twice daily     No facility-administered medications prior to visit.      Allergies:   Review of patient's allergies indicates no known allergies.   Social History   Social History  . Marital status: Divorced    Spouse name: N/A  . Number of children: N/A  . Years of education: N/A   Social History Main Topics  . Smoking status: Former Smoker    Years: 37.00    Types: Cigarettes    Quit date: 06/21/2002  . Smokeless tobacco: Never Used  . Alcohol use No  . Drug use: No  . Sexual activity: Not Asked   Other Topics  Concern  . None   Social History Narrative  . None     Family History:  The patient's family history includes Stroke in her father and mother.   ROS:   Please see the history of present illness.    ROS All other systems reviewed and are negative.   PHYSICAL EXAM:   VS:  BP 134/86   Pulse 64   Ht 5' (1.524 m)   Wt 217 lb 9.6 oz (98.7 kg)   BMI 42.50 kg/m    GEN: Well nourished, well developed, in no acute distress  HEENT: normal  Neck: no JVD, carotid bruits, or masses Cardiac: RRR; no murmurs, rubs, or gallops,no edema  Respiratory:  clear to auscultation bilaterally, normal work of breathing GI: soft, nontender, nondistended, + BS MS: no deformity or atrophy  Skin: warm and dry, no rash Neuro:  Alert and Oriented x 3, Strength and sensation are intact Psych: euthymic mood, full affect  Wt Readings from Last 3 Encounters:  05/11/16 217 lb 9.6 oz (98.7 kg)  03/06/16 218 lb (98.9 kg)  01/06/16 214 lb 9.6 oz (97.3 kg)      Studies/Labs Reviewed:   EKG:  EKG is ordered today.  The ekg ordered today demonstrates 05/11/16-sinus rhythm, incomplete right bundle branch block, left anterior fascicular block, small septal infarct pattern. Poor R-wave progression. Personally viewed.  Recent Labs: No results found for requested labs within last 8760 hours.   Lipid Panel    Component Value Date/Time   CHOL 122 05/01/2012 0610   TRIG 76 05/01/2012 0610   HDL 47 05/01/2012 0610   CHOLHDL 2.6 05/01/2012 0610   VLDL 15 05/01/2012 0610   LDLCALC 60 05/01/2012 0610    Additional studies/ records that were reviewed today include:   Cardiac cath 05/02/16:  Hemodynamic data:  Left ventricular pressure was 83/8 with LVEDP of 11 mm mercury. Aortic pressure was 83/55 with a mean of 69 mm mercury. No pressure gradient across the aortic valve  Left ventricle: Performed in the RAO projection revealed LVEF of 55-60 There was No MR. No Wall motion abnormality.  Right coronary  artery: The vessel is smooth, normal, Very small with a very tiny PD branch. It is Co- Dominant. Normal  Left main coronary artery is large and normal.  Circumflex coronary artery: A large vessel giving origin to a large obtuse marginal 1. It is dominant. Normal  LAD:  LAD gives origin to a large diagonal-1. LAD Ends before reaching the apex. Normal  Ramus intermediate: It is a very large branch. It is smooth and normal.  Impression: Normal coronary arteries left and right codominant system, right coronary artery is extremely small. No angiographic evidence of coronary artery disease. Evaluation for noncardiac causes of chest pain is indicated. I suspect the markedly abnormal EKG is probably due to hypertension.     ASSESSMENT:    1. Preoperative cardiovascular examination   2. Essential hypertension   3. Pure hypercholesterolemia   4. Morbid obesity (HCC)      PLAN:  In order of problems listed above:  Preoperative cardiovascular exam  - Based upon prior cardiac catheterization which showed normal coronary arteries, normal pump function, lack of anginal symptoms, baseline COPD with mild shortness of breath with activity, she may proceed with her bariatric surgery with low overall cardiac risk. No further cardiac testing is needed at this time. She is able to complete greater than 4 METS of activity.  COPD  - Mild to moderate as above.  - Please see pulmonary note for further details.  Hypertension, essential  - Well controlled with metoprolol.  Hyperlipidemia  - Continue with Lescol. Doing well.  Morbid obesity  - Wishes to proceed forward with gastric bypass surgery. History of lap band in the past.  Left anterior fascicular block  - Watch for any other conduction abnormalities. Currently stable.  Medication Adjustments/Labs and Tests Ordered: Current medicines are reviewed at length with the patient today.  Concerns regarding medicines are outlined above.   Medication changes, Labs and Tests ordered today are listed in the Patient Instructions below. Patient Instructions  Medication Instructions:  The current medical regimen is effective;  continue present plan and medications.  Follow-Up: Follow up as needed with Dr Marlou Porch.  If you need a refill on your cardiac medications before your next appointment, please call your pharmacy.  Thank you for choosing [facility]!!        Signed, Candee Furbish, MD  05/11/2016 10:29 AM    [address], [place], [place]  [address] Phone: [phone]; Fax: [phone]

## 2016-05-12 NOTE — Addendum Note (Signed)
Addended by: Shellia Cleverly on: 05/12/2016 01:05 PM   Modules accepted: Orders

## 2016-05-14 DIAGNOSIS — Z5181 Encounter for therapeutic drug level monitoring: Secondary | ICD-10-CM | POA: Diagnosis not present

## 2016-05-22 DIAGNOSIS — K5730 Diverticulosis of large intestine without perforation or abscess without bleeding: Secondary | ICD-10-CM | POA: Diagnosis not present

## 2016-05-22 DIAGNOSIS — K640 First degree hemorrhoids: Secondary | ICD-10-CM | POA: Diagnosis not present

## 2016-05-22 DIAGNOSIS — Z86010 Personal history of colonic polyps: Secondary | ICD-10-CM | POA: Diagnosis not present

## 2016-05-22 DIAGNOSIS — D126 Benign neoplasm of colon, unspecified: Secondary | ICD-10-CM | POA: Diagnosis not present

## 2016-05-26 DIAGNOSIS — D126 Benign neoplasm of colon, unspecified: Secondary | ICD-10-CM | POA: Diagnosis not present

## 2016-06-01 DIAGNOSIS — Z79899 Other long term (current) drug therapy: Secondary | ICD-10-CM | POA: Diagnosis not present

## 2016-06-01 DIAGNOSIS — J449 Chronic obstructive pulmonary disease, unspecified: Secondary | ICD-10-CM | POA: Diagnosis not present

## 2016-06-09 DIAGNOSIS — F4323 Adjustment disorder with mixed anxiety and depressed mood: Secondary | ICD-10-CM | POA: Diagnosis not present

## 2016-06-16 DIAGNOSIS — E559 Vitamin D deficiency, unspecified: Secondary | ICD-10-CM | POA: Diagnosis not present

## 2016-07-10 DIAGNOSIS — K449 Diaphragmatic hernia without obstruction or gangrene: Secondary | ICD-10-CM | POA: Diagnosis not present

## 2016-07-10 DIAGNOSIS — K319 Disease of stomach and duodenum, unspecified: Secondary | ICD-10-CM | POA: Diagnosis not present

## 2016-07-10 DIAGNOSIS — K2970 Gastritis, unspecified, without bleeding: Secondary | ICD-10-CM | POA: Diagnosis not present

## 2016-07-10 DIAGNOSIS — R0602 Shortness of breath: Secondary | ICD-10-CM | POA: Diagnosis not present

## 2016-07-20 HISTORY — PX: LAPAROSCOPIC ROUX-EN-Y GASTRIC BYPASS WITH UPPER ENDOSCOPY AND REMOVAL OF LAP BAND: SHX6505

## 2016-07-21 ENCOUNTER — Ambulatory Visit: Payer: Medicare Other | Admitting: Internal Medicine

## 2016-08-04 DIAGNOSIS — J302 Other seasonal allergic rhinitis: Secondary | ICD-10-CM | POA: Diagnosis not present

## 2016-08-04 DIAGNOSIS — R1310 Dysphagia, unspecified: Secondary | ICD-10-CM | POA: Diagnosis not present

## 2016-08-04 DIAGNOSIS — Z9884 Bariatric surgery status: Secondary | ICD-10-CM | POA: Diagnosis not present

## 2016-08-20 DIAGNOSIS — Z713 Dietary counseling and surveillance: Secondary | ICD-10-CM | POA: Diagnosis not present

## 2016-08-20 DIAGNOSIS — E119 Type 2 diabetes mellitus without complications: Secondary | ICD-10-CM | POA: Diagnosis not present

## 2016-09-07 ENCOUNTER — Ambulatory Visit: Payer: Medicare Other | Admitting: Internal Medicine

## 2016-09-23 ENCOUNTER — Emergency Department (HOSPITAL_COMMUNITY)
Admission: EM | Admit: 2016-09-23 | Discharge: 2016-09-23 | Discharge status: Against Medical Advice | Payer: Medicare Other | Attending: Emergency Medicine | Admitting: Emergency Medicine

## 2016-09-23 ENCOUNTER — Ambulatory Visit (INDEPENDENT_AMBULATORY_CARE_PROVIDER_SITE_OTHER)
Admission: RE | Admit: 2016-09-23 | Discharge: 2016-09-23 | Disposition: A | Discharge status: Home / Self Care | Payer: Medicare Other | Source: Ambulatory Visit | Attending: Internal Medicine | Admitting: Internal Medicine

## 2016-09-23 ENCOUNTER — Ambulatory Visit (INDEPENDENT_AMBULATORY_CARE_PROVIDER_SITE_OTHER): Payer: Medicare Other | Admitting: Internal Medicine

## 2016-09-23 ENCOUNTER — Encounter: Payer: Self-pay | Admitting: Internal Medicine

## 2016-09-23 ENCOUNTER — Telehealth: Payer: Self-pay | Admitting: Internal Medicine

## 2016-09-23 ENCOUNTER — Encounter (HOSPITAL_COMMUNITY): Payer: Self-pay

## 2016-09-23 ENCOUNTER — Emergency Department (HOSPITAL_COMMUNITY): Payer: Medicare Other

## 2016-09-23 VITALS — BP 130/80 | HR 80 | Temp 98.8°F | Ht 60.0 in | Wt 215.6 lb

## 2016-09-23 DIAGNOSIS — R0789 Other chest pain: Secondary | ICD-10-CM | POA: Insufficient documentation

## 2016-09-23 DIAGNOSIS — R042 Hemoptysis: Secondary | ICD-10-CM | POA: Diagnosis not present

## 2016-09-23 DIAGNOSIS — J449 Chronic obstructive pulmonary disease, unspecified: Secondary | ICD-10-CM | POA: Diagnosis not present

## 2016-09-23 DIAGNOSIS — Z79899 Other long term (current) drug therapy: Secondary | ICD-10-CM | POA: Diagnosis not present

## 2016-09-23 DIAGNOSIS — Z85118 Personal history of other malignant neoplasm of bronchus and lung: Secondary | ICD-10-CM | POA: Insufficient documentation

## 2016-09-23 DIAGNOSIS — R0602 Shortness of breath: Secondary | ICD-10-CM | POA: Insufficient documentation

## 2016-09-23 DIAGNOSIS — E119 Type 2 diabetes mellitus without complications: Secondary | ICD-10-CM | POA: Insufficient documentation

## 2016-09-23 DIAGNOSIS — R05 Cough: Secondary | ICD-10-CM | POA: Diagnosis not present

## 2016-09-23 DIAGNOSIS — Z7982 Long term (current) use of aspirin: Secondary | ICD-10-CM | POA: Insufficient documentation

## 2016-09-23 DIAGNOSIS — Z87891 Personal history of nicotine dependence: Secondary | ICD-10-CM | POA: Diagnosis not present

## 2016-09-23 DIAGNOSIS — R059 Cough, unspecified: Secondary | ICD-10-CM

## 2016-09-23 DIAGNOSIS — R0600 Dyspnea, unspecified: Secondary | ICD-10-CM | POA: Diagnosis not present

## 2016-09-23 LAB — I-STAT TROPONIN, ED: TROPONIN I, POC: 0 ng/mL (ref 0.00–0.08)

## 2016-09-23 LAB — CBC WITH DIFFERENTIAL/PLATELET
BASOS ABS: 0 10*3/uL (ref 0.0–0.1)
BASOS PCT: 0 %
Eosinophils Absolute: 0.1 10*3/uL (ref 0.0–0.7)
Eosinophils Relative: 1 %
HEMATOCRIT: 45 % (ref 36.0–46.0)
HEMOGLOBIN: 14.6 g/dL (ref 12.0–15.0)
LYMPHS PCT: 16 %
Lymphs Abs: 0.9 10*3/uL (ref 0.7–4.0)
MCH: 26.6 pg (ref 26.0–34.0)
MCHC: 32.4 g/dL (ref 30.0–36.0)
MCV: 82 fL (ref 78.0–100.0)
Monocytes Absolute: 0.6 10*3/uL (ref 0.1–1.0)
Monocytes Relative: 11 %
NEUTROS ABS: 4.1 10*3/uL (ref 1.7–7.7)
NEUTROS PCT: 72 %
Platelets: 153 10*3/uL (ref 150–400)
RBC: 5.49 MIL/uL — AB (ref 3.87–5.11)
RDW: 15.8 % — AB (ref 11.5–15.5)
WBC: 5.6 10*3/uL (ref 4.0–10.5)

## 2016-09-23 LAB — COMPREHENSIVE METABOLIC PANEL
ALBUMIN: 4.2 g/dL (ref 3.5–5.0)
ALT: 15 U/L (ref 14–54)
AST: 28 U/L (ref 15–41)
Alkaline Phosphatase: 59 U/L (ref 38–126)
Anion gap: 6 (ref 5–15)
BILIRUBIN TOTAL: 0.6 mg/dL (ref 0.3–1.2)
BUN: 14 mg/dL (ref 6–20)
CO2: 27 mmol/L (ref 22–32)
CREATININE: 0.94 mg/dL (ref 0.44–1.00)
Calcium: 8.9 mg/dL (ref 8.9–10.3)
Chloride: 106 mmol/L (ref 101–111)
GFR calc Af Amer: >60 mL/min (ref >60)
GFR, EST NON AFRICAN AMERICAN: 60 mL/min — AB (ref >60)
GLUCOSE: 95 mg/dL (ref 65–99)
POTASSIUM: 4.2 mmol/L (ref 3.5–5.1)
Sodium: 139 mmol/L (ref 135–145)
Total Protein: 7.3 g/dL (ref 6.5–8.1)

## 2016-09-23 LAB — URINALYSIS, ROUTINE W REFLEX MICROSCOPIC
Bilirubin Urine: NEGATIVE
Glucose, UA: NEGATIVE mg/dL
Hgb urine dipstick: NEGATIVE
KETONES UR: 20 mg/dL — AB
LEUKOCYTES UA: NEGATIVE
NITRITE: NEGATIVE
PH: 7 (ref 5.0–8.0)
PROTEIN: NEGATIVE mg/dL
Specific Gravity, Urine: 1.02 (ref 1.005–1.030)

## 2016-09-23 LAB — PROTIME-INR
INR: 1.07
Prothrombin Time: 13.9 seconds (ref 11.4–15.2)

## 2016-09-23 LAB — POCT INFLUENZA A/B
INFLUENZA A, POC: NEGATIVE
INFLUENZA B, POC: NEGATIVE

## 2016-09-23 LAB — I-STAT CG4 LACTIC ACID, ED: LACTIC ACID, VENOUS: 1.2 mmol/L (ref 0.5–1.9)

## 2016-09-23 MED ORDER — HYDROCODONE-CHLORPHENIRAMINE 5-4 MG/5ML PO SOLN
5.0000 mL | Freq: Two times a day (BID) | ORAL | 0 refills | Status: DC | PRN
Start: 2016-09-23 — End: 2016-10-26

## 2016-09-23 MED ORDER — IOPAMIDOL (ISOVUE-370) INJECTION 76%
100.0000 mL | Freq: Once | INTRAVENOUS | Status: AC | PRN
  Administered 2016-09-23: 100 mL via INTRAVENOUS

## 2016-09-23 MED ORDER — IOPAMIDOL (ISOVUE-370) INJECTION 76%
INTRAVENOUS | Status: DC
Start: 2016-09-23 — End: 2016-09-24
  Filled 2016-09-23: qty 100

## 2016-09-23 NOTE — ED Notes (Addendum)
Patient transported to CT 

## 2016-09-23 NOTE — ED Notes (Signed)
Pt anxious to leave. RN informed pt and family that CT had not yet resulted and we were still waiting but would come back in as soon as results were in.

## 2016-09-23 NOTE — ED Notes (Addendum)
MD made aware that CT scan was resulted.

## 2016-09-23 NOTE — ED Provider Notes (Signed)
[facility] DEPT Provider Note   CSN: [id number] Arrival date & time: 09/23/16  1657     History   Chief Complaint Chief Complaint  Patient presents with  . Hemoptysis    HPI [name] is a 71 y.o. female with a past medical history significant for prior lung cancer status post lobectomy on the left side and chemotherapy in 2003, diabetes, and COPD who presents from her PCP office for fevers, chills, hemoptysis, productive cough, shortness of breath, and chest pain. Patient reports that she was sent from her PCP office to rule out pneumonia and pulmonary embolism. Patient says that over the last several days, she has had gradually worsening symptoms. She says that her chest pain started several days ago suddenly. She says it is a pressure-like pain in her central chest that radiates to the right side and towards her right shoulder and arm. She describes the pain as 8 out of 10 at times and is worse with exertion. It is nonpleuritic. She says that it is not worse with palpation. She reports having a fever earlier of 100.8. She says the shortness of breath has been constant but is better when she rests. She denies dysuria but does report some urinary frequency. She reports a scratchy throat and reports chronic constipation. She denies dysuria or diarrhea. She denies any recent chest trauma. She reports that family members had upper extremity infections over the last few weeks. She takes aspirin.    HPI  Past Medical History:  Diagnosis Date  . Abdominal pain    around naval  . Arthritis   . Breast pain in female   . Bruises easily   . Cancer ([place]) 2003   lung  . Cervical disc disease   . Chronic depression   . COPD (chronic obstructive pulmonary disease) ([place])   . Diabetes mellitus   . DJD (degenerative joint disease)   . Gum disease   . Hernia   . Insomnia   . Phlebitis   . Thyroid disease   . Wears glasses   . Weight increase     Patient Active Problem List     Diagnosis Date Noted  . Encounter for preoperative examination for general surgical procedure 03/06/2016  . Cough 08/02/2015  . Obstructive sleep apnea 08/06/2013  . COPD with acute bronchitis ([place]) 06/18/2013  . Myalgia 06/18/2013  . History of laparoscopic adjustable gastric banding, APS. 09/04/2008. 04/08/2011  . History of lung cancer 06/27/2010  . SKIN RASH 12/13/2009  . DYSPNEA 07/05/2008  . DIABETES, TYPE 2 07/01/2008  . Obesity, morbid ([place]) 07/01/2008  . COPD mixed type ([place]) 06/26/2008    Past Surgical History:  Procedure Laterality Date  . ABDOMINAL HYSTERECTOMY     partial  . FOOT SURGERY     right  . LAPAROSCOPIC GASTRIC BANDING  Feb 2010  . LEFT HEART CATHETERIZATION WITH CORONARY ANGIOGRAM N/A 05/02/2012   Procedure: LEFT HEART CATHETERIZATION WITH CORONARY ANGIOGRAM;  Surgeon: Laverda Page, MD;  Location: [facility] CATH LAB;  Service: Cardiovascular;  Laterality: N/A;  . LUNG LOBECTOMY  2003   left  . SPINE SURGERY     fusion on the neck  . THYROIDECTOMY     partial  . TONSILLECTOMY      OB History    No data available       Home Medications    Prior to Admission medications   Medication Sig Start Date End Date Taking? Authorizing Provider  acetaminophen (TYLENOL) 500 MG tablet  Take 1,000 mg by mouth every 6 (six) hours as needed for mild pain or moderate pain.   Yes Historical Provider, MD  albuterol (PROVENTIL) (2.5 MG/3ML) 0.083% nebulizer solution Use 1 vial 3 times daily as needed DX 496 Patient taking differently: Take 2.5 mg by nebulization every 8 (eight) hours as needed for wheezing or shortness of breath.  06/28/14  Yes Deneise Lever, MD  Albuterol Sulfate (PROAIR RESPICLICK) 841 (90 BASE) MCG/ACT AEPB Inhale 2 puffs into the lungs every 4 (four) hours as needed. Patient taking differently: Inhale 2 puffs into the lungs every 4 (four) hours as needed (SOB, wheezing).  06/28/14  Yes Deneise Lever, MD  ALPRAZolam Duanne Moron) 0.25 MG tablet Take  0.25 mg by mouth at bedtime as needed for sleep.  01/14/11  Yes Historical Provider, MD  aspirin 325 MG EC tablet Take 325 mg by mouth once.   Yes Historical Provider, MD  Biotin 5000 MCG TABS Take 10,000 mcg by mouth daily.   Yes Historical Provider, MD  cetirizine (ZYRTEC) 10 MG tablet Take 10 mg by mouth daily.   Yes Historical Provider, MD  cholecalciferol (VITAMIN D) 1000 units tablet Take 1,000 Units by mouth daily.   Yes Historical Provider, MD  diphenhydrAMINE (BENADRYL) 25 mg capsule Take 25 mg by mouth daily as needed for itching, allergies or sleep. For itching    Yes Historical Provider, MD  Hydrocodone-Chlorpheniramine 5-4 MG/5ML SOLN Take 5 mLs by mouth every 12 (twelve) hours as needed (cough). 09/23/16  Yes Tanda Rockers, MD  metoprolol tartrate (LOPRESSOR) 25 MG tablet Take 25 mg by mouth daily.   Yes Historical Provider, MD  Multiple Vitamin (MULTIVITAMIN) tablet Take 1 tablet by mouth daily.   Yes Historical Provider, MD  Nebulizers (COMPRESSOR/NEBULIZER) MISC Use as directed 06/28/14  Yes Deneise Lever, MD  SPIRIVA HANDIHALER 18 MCG inhalation capsule PLACE 1 CAPSULE INTO INHALER AND INHALE DAILY. 08/06/15  Yes Deneise Lever, MD    Family History Family History  Problem Relation Age of Onset  . Stroke Mother   . Stroke Father     Social History Social History  Substance Use Topics  . Smoking status: Former Smoker    Years: 37.00    Types: Cigarettes    Quit date: 06/21/2002  . Smokeless tobacco: Never Used  . Alcohol use No     Allergies   Patient has no known allergies.   Review of Systems Review of Systems  Constitutional: Positive for chills, fatigue and fever. Negative for activity change and diaphoresis.  HENT: Positive for congestion and rhinorrhea.   Respiratory: Positive for cough and shortness of breath. Negative for chest tightness and wheezing.   Cardiovascular: Positive for chest pain. Negative for palpitations and leg swelling.    Gastrointestinal: Negative for abdominal pain and nausea.  Genitourinary: Negative for dysuria and flank pain.  Musculoskeletal: Negative for back pain, neck pain and neck stiffness.  Skin: Negative for rash and wound.  Neurological: Negative for dizziness, seizures, light-headedness and headaches.  Psychiatric/Behavioral: Negative for agitation and confusion.  All other systems reviewed and are negative.    Physical Exam Updated Vital Signs BP 156/98 (BP Location: Left Arm)   Pulse 89   Temp 99.5 F (37.5 C) (Oral)   Resp 18   SpO2 96%   Physical Exam  Constitutional: She is oriented to person, place, and time. She appears well-developed and well-nourished. No distress.  HENT:  Head: Normocephalic and atraumatic.  Right Ear: External  ear normal.  Left Ear: External ear normal.  Nose: Nose normal.  Mouth/Throat: Oropharynx is clear and moist. No oropharyngeal exudate.  Eyes: Conjunctivae and EOM are normal. Pupils are equal, round, and reactive to light.  Neck: Normal range of motion. Neck supple.  Cardiovascular: Normal rate, normal heart sounds and intact distal pulses.   No murmur heard. Pulmonary/Chest: Effort normal and breath sounds normal. No stridor. No respiratory distress. She has no wheezes. She has no rales. She exhibits no tenderness.  Abdominal: Soft. She exhibits no distension. There is no tenderness. There is no rebound.  Musculoskeletal: She exhibits no tenderness.  Neurological: She is alert and oriented to person, place, and time. She has normal reflexes. No sensory deficit. She exhibits normal muscle tone.  Skin: Skin is warm. Capillary refill takes less than 2 seconds. No rash noted. She is not diaphoretic. No erythema.  Psychiatric: She has a normal mood and affect.  Nursing note and vitals reviewed.    ED Treatments / Results  Labs (all labs ordered are listed, but only abnormal results are displayed) Labs Reviewed  CBC WITH DIFFERENTIAL/PLATELET  - Abnormal; Notable for the following:       Result Value   RBC 5.49 (*)    RDW 15.8 (*)    All other components within normal limits  COMPREHENSIVE METABOLIC PANEL - Abnormal; Notable for the following:    GFR calc non Af Amer 60 (*)    All other components within normal limits  URINALYSIS, ROUTINE W REFLEX MICROSCOPIC - Abnormal; Notable for the following:    Ketones, ur 20 (*)    All other components within normal limits  URINE CULTURE  PROTIME-INR  I-STAT CG4 LACTIC ACID, ED  I-STAT TROPOININ, ED    EKG  EKG Interpretation None      ED ECG REPORT   Date: 09/24/2016  Rate: 91  Rhythm: normal sinus rhythm  QRS Axis: left ,  Probable LVH  Intervals: normal  ST/T Wave abnormalities: Abnormal R wave transistion  Conduction Disutrbances:left anterior fascicular block  Narrative Interpretation:   Old EKG Reviewed: unchanged - T waves improved in lateral leads.   I have personally reviewed the EKG tracing and agree with the computerized printout as noted.    Radiology Dg Chest 2 View  Result Date: 09/23/2016 CLINICAL DATA:  Hemoptysis since yesterday. Dyspnea/chest heaviness x 3 days; worsening. Hx of COPD, DM, emphysema, lung ca (left), LLL lobectomy. Ex smoker. EXAM: CHEST - 2 VIEW COMPARISON:  03/06/2016 FINDINGS: Attenuated bronchovascular markings in the upper lobes as before. Changes of previous left thoracotomy. Surgical clips inferior to the left hilum. No new infiltrate or overt edema. Heart size and mediastinal contours are within normal limits. No effusion. Visualized bones unremarkable. IMPRESSION: Stable postop and chronic changes.  No acute disease. Electronically Signed   By: Lucrezia Europe M.D.   On: 09/23/2016 16:43   Ct Angio Chest Pe W And/or Wo Contrast  Result Date: 09/23/2016 CLINICAL DATA:  Hemoptysis since last evening. History of left lower lobe resection and chemotherapy in 2003 for non-small cell lung cancer. Cough since Tuesday with low grade fever.  EXAM: CT ANGIOGRAPHY CHEST WITH CONTRAST TECHNIQUE: Multidetector CT imaging of the chest was performed using the standard protocol during bolus administration of intravenous contrast. Multiplanar CT image reconstructions and MIPs were obtained to evaluate the vascular anatomy. CONTRAST:  100 cc Isovue 370 IV COMPARISON:  None. FINDINGS: Cardiovascular: Satisfactory opacification of the pulmonary arteries to the segmental level. No  evidence of pulmonary embolism. Normal heart size. No pericardial effusion. No aortic aneurysm or dissection. Mediastinum/Nodes: Small paratracheal and AP window lymph nodes the largest approximately 1 cm short axis in the aortic or pulmonary window. Patent trachea and mainstem bronchi. Esophagus is unremarkable. No thyroid nodules. Lungs/Pleura: Bilateral centrilobular emphysema. Status post left lower lobectomy. Subpleural blebs along the periphery the left lobe laterally with adjacent left basilar scarring. No dominant mass, effusion or pneumothorax. Upper Abdomen: Status post lap band. Normal bilateral adrenal glands. No acute abnormality. Musculoskeletal: No osteolytic or blastic disease. Thoracolumbar spondylosis with multilevel degenerative disc space narrowing and osteophytes. Review of the MIP images confirms the above findings. IMPRESSION: 1. No acute pulmonary embolus. 2. Status post left lower lobectomy with pulmonary scarring. COPD. No acute pneumonic consolidation, pneumothorax nor effusion. Electronically Signed   By: Ashley Royalty M.D.   On: 09/23/2016 21:27    Procedures Procedures (including critical care time)  Medications Ordered in ED Medications  iopamidol (ISOVUE-370) 76 % injection 100 mL (100 mLs Intravenous Contrast Given 09/23/16 2048)     Initial Impression / Assessment and Plan / ED Course  I have reviewed the triage vital signs and the nursing notes.  Pertinent labs & imaging results that were available during my care of the patient were reviewed  by me and considered in my medical decision making (see chart for details).     [name] is a 71 y.o. female with a past medical history significant for prior lung cancer status post lobectomy on the left side and chemotherapy in 2003, diabetes, and COPD who presents from her PCP office for fevers, chills, hemoptysis, productive cough, shortness of breath, and chest pain.  History and exam are seen above.  On exam, patient's lungs were clear. Patient's chest was nontender. Patient had symmetric pulses bilaterally. Patient's abdomen was nontender. No focal neurologic deficits were seen. Patient's oropharyngeal exam was unremarkable.  Based on patient's report of lung cancer history, hemoptysis, fevers, and chills, and productive cough, patient was worked up for pneumonia versus pulmonary embolism. Feel patient is too high risk for a d-dimer. CT PE study will be ordered to be done after adequate kidney function is tested for. Patient says she is not in pain while at rest. Patient is on room air.  Anticipate assessment after workup is completed.   CT scan showed no evidence of pulmonary embolism or pneumonia. Operatory testing reassuring with unremarkable CMP, unremarkable CBC, and no evidence of urinary tract infection. Troponin negative and INR was normal.  Patient eloped from the emergency department prior to the final results of her imaging. Patient was on room air and otherwise appeared well. Patient reports that she will follow-up with her PCP for the results or patient returns emergency department should any new symptoms. Patient eloped in stable condition.   Final Clinical Impressions(s) / ED Diagnoses   Final diagnoses:  Hemoptysis  Cough with hemoptysis  Other chest pain  Shortness of breath    Clinical Impression: 1. Hemoptysis   2. Cough with hemoptysis   3. Other chest pain   4. Shortness of breath     Disposition: Eloped  Condition: Stable      Courtney Paris, MD 09/24/16 [phone]

## 2016-09-23 NOTE — ED Triage Notes (Signed)
Pt spitting up blood starting last night.  Hx of lung cancer.  Pt states mixed with mucous.  Pt has cough since Tuesday.  Low grade fever.  MD sent her here for eval.

## 2016-09-23 NOTE — Patient Instructions (Signed)
Please go to [facility] and I will call them and let them know what you need to be sure you haven't had a blood clot

## 2016-09-23 NOTE — Assessment & Plan Note (Signed)
Probably viral uri/bronchitis though the hemoptysis and localized (to R ) cp are worrisome for PE in this morbidly obese pt > no choice at this late hour but send to er to eval for possible PE/ sepsis > called triage to make Tim RN aware she's on her way, safe to travel by personal vehicle to [facility] across the street.   I had an extended discussion with the patient reviewing all relevant studies completed to date and  lasting 25 minutes of a 40  minute acute office  visit in pt not previously known to me with new      non-specific but potentially very serious refractory respiratory symptoms of unknown etiology.

## 2016-09-23 NOTE — ED Notes (Signed)
Pt left AMA stating that everyone had been nice but this was 'the longest process I have ever been through." Pt was urged to return if anything worsened and to f/u with PCP tomorrow to get results that were not discussed. Patient was alert, oriented and stable appearing upon leaving.

## 2016-09-23 NOTE — Progress Notes (Signed)
Subjective:    Patient ID: [name], female    DOB: 05-Dec-1945,     MRN: [id number]    71 year old female quit smoking 2003  followed for COPD with Dr. Annamaria Boots. She has a history of a left lower lobe resection and chemotherapy in 2003 for non-small cell lung cancer. History of obesity status post lap band surgery.  GOLD II criteria 03/06/16      03/06/2016 NP Follow up : COPD , surgical clearance She presents for a follow-up for COPD. She also will be undergoing gastric bypass surgery and requires pulmonary surgical clearance.  Patient says overall she is doing well with her breathing. She denies any shortness of breath, wheezing, or increased cough. It is not on any oxygen, and O2 saturation today is 97% on room air. She says that she is active, does housework. Says can not walk long distance due to dyspnea.  Simple spirometry today shows mild COPD with an FEV1 at 78%, ratio 58, FVC 104%. Takes Spiriva daily .  Had lap bad surgery in 2008 . Lost around 60lbs. But wants to lose more and has trouble with lap band with taking her meds.   Dr. Darnell Level with [facility] surgeon on [place].  Has very  mild OSA with 2014 PSG at ~5.3/hr (wt 234lbs). Did not require CPAP . Recommended weight loss.  Has DM , diet controlled.  rec No change rx/ cleared for surgery    09/23/2016 acute extended ov/[facility] re:  GOLD II COPD / acute cough with hemptysis   Chief Complaint  Patient presents with  . Acute Visit    Pt c/o cough with clear sputum mixed with dark red since last night. She states she has been feeling more SOB for the past 2 days. She had low grade temp this morning. She also c/o aches- back and arms.   acute onset sob with bad cough variably bloody assoc   R sided cp not classically pleuritic assoc with polyuria and headache.  Also some leg swelling but nothing new/acute in last few weeks. Mild chills/fever no rigors/ no epistaxis   No obvious day to day or daytime variability  or assoc   chest tightness, subjective wheeze or overt sinus or hb symptoms. No unusual exp hx or h/o childhood pna/ asthma or knowledge of premature birth.  Sleeping ok without nocturnal  or early am exacerbation  of respiratory  c/o's or need for noct saba. Also denies any obvious fluctuation of symptoms with weather or environmental changes or other aggravating or alleviating factors except as outlined above   Current Medications, Allergies, Complete Past Medical History, Past Surgical History, Family History, and Social History were reviewed in [employer] record.  ROS  The following are not active complaints unless bolded sore throat, dysphagia, dental problems, itching, sneezing,  nasal congestion or excess/ purulent secretions, ear ache,   fever, chills, sweats, unintended wt loss, classically pleuritic or exertional cp,  orthopnea pnd or leg swelling, presyncope, palpitations, abdominal pain, anorexia, nausea, vomiting, diarrhea  or change in bowel or bladder habits, change in stools or urine, dysuria,hematuria,  rash, arthralgias, visual complaints, headache, numbness, weakness or ataxia or problems with walking or coordination,  change in mood/affect or memory.                Objective:   Physical Exam   obese bf with somewhat of a belle affect/ nad  Wt Readings from Last 3 Encounters:  09/23/16 215 lb 9.6  oz (97.8 kg)  05/11/16 217 lb 9.6 oz (98.7 kg)  03/06/16 218 lb (98.9 kg)    Vital signs reviewed  - Note on arrival 02 sats  96% on RA     HEENT: nl dentition, turbinates bilaterally, and oropharynx. Nl external ear canals without cough reflex   NECK :  without JVD/Nodes/TM/ nl carotid upstrokes bilaterally   LUNGS: no acc muscle use,  Nl contour chest which is clear to A and P bilaterally without cough on insp or exp maneuvers   CV:  RRR  no s3 or murmur or increase in P2, and no edema   ABD:  soft and nontender with nl inspiratory excursion  in the supine position. No bruits or organomegaly appreciated, bowel sounds nl  MS:  Nl gait/ ext warm without deformities, calf tenderness, cyanosis or clubbing No obvious joint restrictions   SKIN: warm and dry without lesions    NEURO:  alert, approp, nl sensorium with  no motor or cerebellar deficits apparent.    CXR PA and Lateral:   09/23/2016 :    I personally reviewed images and agree with radiology impression as follows:   Old L basilar changes/ nothing on R acutely   Influenza Sreen  09/23/2016  A and B :  Neg    Outpatient Encounter Prescriptions as of 09/23/2016  Medication Sig  . albuterol (PROVENTIL) (2.5 MG/3ML) 0.083% nebulizer solution Use 1 vial 3 times daily as needed DX 496  . Albuterol Sulfate (PROAIR RESPICLICK) 001 (90 BASE) MCG/ACT AEPB Inhale 2 puffs into the lungs every 4 (four) hours as needed.  . ALPRAZolam (XANAX) 0.25 MG tablet Take 0.25 mg by mouth at bedtime as needed. For sleep  . Biotin 5000 MCG TABS Take 10,000 mcg by mouth daily.  . cetirizine (ZYRTEC) 10 MG tablet Take 10 mg by mouth daily.  . cholecalciferol (VITAMIN D) 1000 units tablet Take 1,000 Units by mouth daily.  . diphenhydrAMINE (BENADRYL) 25 mg capsule Take 25 mg by mouth daily as needed. For itching  . Hydrocodone-Chlorpheniramine 5-4 MG/5ML SOLN Take 5 mLs by mouth every 12 (twelve) hours as needed (cough).  . metoprolol tartrate (LOPRESSOR) 25 MG tablet Take 25 mg by mouth daily.  . Multiple Vitamin (MULTIVITAMIN) tablet Take 1 tablet by mouth daily.  . Nebulizers (COMPRESSOR/NEBULIZER) MISC Use as directed  . SPIRIVA HANDIHALER 18 MCG inhalation capsule PLACE 1 CAPSULE INTO INHALER AND INHALE DAILY.  . [DISCONTINUED] Hydrocodone-Chlorpheniramine 5-4 MG/5ML SOLN Take 5 mLs by mouth every 12 (twelve) hours as needed (cough).  . [DISCONTINUED] fluvastatin (LESCOL) 20 MG capsule Take 1 capsule by mouth daily.  . [DISCONTINUED] naproxen sodium (ANAPROX) 220 MG tablet Take 220 mg by mouth  daily.   No facility-administered encounter medications on file as of 09/23/2016.

## 2016-09-23 NOTE — Telephone Encounter (Signed)
Spoke with the pt  She is having increased SOB and hemoptysis since last night  OV with MW at 4:30, advised ER sooner if needed

## 2016-09-24 ENCOUNTER — Telehealth: Payer: Self-pay | Admitting: Internal Medicine

## 2016-09-24 MED ORDER — AZITHROMYCIN 250 MG PO TABS: ORAL_TABLET | ORAL | 0 refills | Status: AC

## 2016-09-24 NOTE — Telephone Encounter (Signed)
Spoke with pt. She is aware of MW's response. Rx has been sent in. Pt will call if she needs an appointment. Nothing further was needed.

## 2016-09-24 NOTE — Telephone Encounter (Signed)
lmtcb x1 for pt. 

## 2016-09-24 NOTE — Telephone Encounter (Signed)
[phone] Pt calling back

## 2016-09-24 NOTE — Telephone Encounter (Signed)
It was fine so she probably just has a bronchitis which was my original diagnosis before she told be about the chest pain  rz  zpak Take the cough med as needed No aspirin until all bleeding stops   F/u in one week unless 100% better, if getting worse call sooner

## 2016-09-24 NOTE — Telephone Encounter (Signed)
Spoke with pt. States that she would like the results from her CT Angio. She went to the ED to have this done but didn't stay to get her results. Reports that she was there until 10:30pm and wasn't given any water or anything to eat so she left without getting the results.  MW - please advise. Thanks.

## 2016-09-25 LAB — URINE CULTURE

## 2016-10-01 DIAGNOSIS — Z713 Dietary counseling and surveillance: Secondary | ICD-10-CM | POA: Diagnosis not present

## 2016-10-01 DIAGNOSIS — E119 Type 2 diabetes mellitus without complications: Secondary | ICD-10-CM | POA: Diagnosis not present

## 2016-10-12 DIAGNOSIS — K429 Umbilical hernia without obstruction or gangrene: Secondary | ICD-10-CM | POA: Diagnosis not present

## 2016-10-12 DIAGNOSIS — J449 Chronic obstructive pulmonary disease, unspecified: Secondary | ICD-10-CM | POA: Diagnosis not present

## 2016-10-12 DIAGNOSIS — E669 Obesity, unspecified: Secondary | ICD-10-CM | POA: Diagnosis not present

## 2016-10-12 DIAGNOSIS — E118 Type 2 diabetes mellitus with unspecified complications: Secondary | ICD-10-CM | POA: Diagnosis not present

## 2016-10-26 ENCOUNTER — Encounter: Payer: Self-pay | Admitting: Internal Medicine

## 2016-10-26 ENCOUNTER — Ambulatory Visit (INDEPENDENT_AMBULATORY_CARE_PROVIDER_SITE_OTHER): Payer: Medicare Other | Admitting: Internal Medicine

## 2016-10-26 DIAGNOSIS — J449 Chronic obstructive pulmonary disease, unspecified: Secondary | ICD-10-CM | POA: Diagnosis not present

## 2016-10-26 DIAGNOSIS — J209 Acute bronchitis, unspecified: Secondary | ICD-10-CM | POA: Diagnosis not present

## 2016-10-26 DIAGNOSIS — J440 Chronic obstructive pulmonary disease with acute lower respiratory infection: Secondary | ICD-10-CM | POA: Diagnosis not present

## 2016-10-26 DIAGNOSIS — G4733 Obstructive sleep apnea (adult) (pediatric): Secondary | ICD-10-CM

## 2016-10-26 MED ORDER — HYDROCODONE-CHLORPHENIRAMINE 5-4 MG/5ML PO SOLN: 5.0000 mL | Freq: Two times a day (BID) | ORAL | 0 refills | Status: DC | PRN

## 2016-10-26 MED ORDER — ALBUTEROL SULFATE (2.5 MG/3ML) 0.083% IN NEBU: 2.5000 mg | INHALATION_SOLUTION | Freq: Three times a day (TID) | RESPIRATORY_TRACT | 12 refills | Status: DC | PRN

## 2016-10-26 MED ORDER — ALBUTEROL SULFATE 108 (90 BASE) MCG/ACT IN AEPB: 2.0000 | INHALATION_SPRAY | RESPIRATORY_TRACT | 12 refills | Status: DC | PRN

## 2016-10-26 MED ORDER — GLYCOPYRROLATE-FORMOTEROL 9-4.8 MCG/ACT IN AERO: 2.0000 | INHALATION_SPRAY | Freq: Two times a day (BID) | RESPIRATORY_TRACT | 12 refills | Status: DC

## 2016-10-26 NOTE — Patient Instructions (Signed)
Sample and Script to try Bevespi maintenance inhaler     Inhale 2 puffs, twice daily                   Try this instead of Spriva  Refill scripts for cough syrup, rescue inhaler, neb solution  Schedule PFT     Dx COPD mixed type  Please call as needed

## 2016-10-26 NOTE — Progress Notes (Signed)
HPI female former smoker followed for COPD, history left lower lobe resection/chemotherapy 8469 NSCCa, complicated by DM, obesity/lap band surgery PFT 07/05/2008-moderate obstructive airways disease with response to bronchodilator, diffusion moderately reduced. FEV1 1.75/87%, FEV1/FVC 0.53, DLCO 49 Office Spirometry 07/04/2015-mild airway obstruction. FEV1/FVC 0.55 Office Spirometry 03/06/2016- mild obstructive airways disease-FVC 2.01/104%, FEV1 1.16/78%, ratio 0.58, FEF 25-75% 0.61/ 43% NPSG 07/03/13- AHI 5.3/ hr, weight 234 pounds  ----------------------------------------------------------------------------------------------------------------------  01/06/2016-71 year old female former smoker followed for COPD, history left lower lobe resection/chemotherapy 6295 NSCCa, complicated by DM, obesity/lap band surgery lov 1/13- treated for acute bronchitis with Z-Pak, prednisone taper, cough syrup, Flonase FOLLOWS FOR: Pt would like to have cough syrup Rx that Maggie Schwalbe gave last--Not Hycodan. Pt feeling much better since last OV with SG.  Pt then noted slight wheezing sounds at night on some occasions. Occasional cough with irritant exposure like strong odors and temperature changes. Woke from sleep once "gurgling" but doesn't recognize reflux. Gradually think she's becoming more short of breath with exertion such as hills and stairs. No acute events. She had dropped off Spiriva but is willing to retry. CXR 08/02/2015 IMPRESSION: 1. No acute cardiopulmonary disease. 2. Stable postsurgical changes on the left. No change from the prior chest radiograph. Electronically Signed  By: Lajean Manes M.D.  On: 08/02/2015 13:16  10/26/2016-70  female former smoker followed for COPD, history left lower lobe resection/chemotherapy 2841 NSCCa, complicated by DM, obesity/lap band surgery 6 month follow up for COPD. States she has more SOB when walking that usual. Denies any chest pain.  Had a bad cough  back in March 2018 and saw Dr. Melvyn Novas. States the cough is now gone.  She has been aware of gradually increasing dyspnea on exertion over the past year. Cardiology is following. She thinks the problem is her weight and is pending insurance approval for gastric bypass after weight gain following remote lap band surgery Denies cough, wheeze, chest pain or palpitation. Mainly she notices dyspnea on exertion, not at rest. Her walker helps. CTa chest- 09/23/2016 IMPRESSION: 1. No acute pulmonary embolus. 2. Status post left lower lobectomy with pulmonary scarring. COPD. No acute pneumonic consolidation, pneumothorax nor effusion.  ROS-see HPI Constitutional:   No-   weight loss, night sweats, fevers, chills,+ fatigue, lassitude. HEENT:   No-  headaches, difficulty swallowing, tooth/dental problems, sore throat,       No-  sneezing, itching, ear ache, +nasal congestion, post nasal drip,  CV:  No-   chest pain, orthopnea, PND, swelling in lower extremities, anasarca, dizziness, palpitations Resp: +  shortness of breath with exertion or at rest.        productive cough,  non-productive cough,   coughing up of blood.              No-   change in color of mucus. wheezing.   Skin: No-   rash or lesions. GI:  No-   heartburn, indigestion, abdominal pain, nausea, vomiting, GU: . MS:  No-   joint pain or swelling.   cramping myalgias Neuro-     nothing unusual Psych:  No- change in mood or affect. No depression or anxiety.  No memory loss.  OBJ General- Alert, Oriented, Affect-appropriate, Distress- none acute, +obese Skin- rash-none, lesions- none, excoriation- none.  Lymphadenopathy- none Head- atraumatic            Eyes- Gross vision intact, PERRLA, conjunctivae clear secretions            Ears- Hearing, canals-normal  Nose- Clear, no-Septal dev, mucus, polyps, erosion, perforation             Throat- Mallampati III-IV , mucosa + red , drainage- none, tonsils- atrophic Neck- flexible ,  trachea midline, no stridor , thyroid nl, carotid no bruit Chest - symmetrical excursion , unlabored           Heart/CV- RRR , no murmur , no gallop  , no rub, nl s1 s2                           - JVD- none , edema- none, stasis changes- none, varices- none           Lung- clear, wheeze- none, cough-none , dullness-none, rub- none           Chest wall- scar left chest Port-A-Cath Abd-  Br/ Gen/ Rectal- Not done, not indicated Extrem- cyanosis- none, clubbing, none, atrophy- none, strength- nl Neuro- grossly intact to observation

## 2016-10-27 MED ORDER — GLYCOPYRROLATE-FORMOTEROL 9-4.8 MCG/ACT IN AERO: 2.0000 | INHALATION_SPRAY | Freq: Two times a day (BID) | RESPIRATORY_TRACT | 0 refills | Status: DC

## 2016-10-27 NOTE — Assessment & Plan Note (Signed)
Continue to recommend weight loss and sleep off flat of back

## 2016-10-27 NOTE — Assessment & Plan Note (Signed)
I think we need to clear her assessment of her pulmonary function. There is a significant obesity hypoventilation component and much of her exertional dyspnea may reflect deconditioning and obesity. Previous evaluations have shown mild to moderate obstructive disease. Plan-trial sample Bevespi, schedule formal PFT

## 2017-01-26 DIAGNOSIS — E119 Type 2 diabetes mellitus without complications: Secondary | ICD-10-CM | POA: Diagnosis not present

## 2017-01-26 DIAGNOSIS — K449 Diaphragmatic hernia without obstruction or gangrene: Secondary | ICD-10-CM | POA: Insufficient documentation

## 2017-03-18 DIAGNOSIS — G4730 Sleep apnea, unspecified: Secondary | ICD-10-CM | POA: Diagnosis not present

## 2017-03-18 DIAGNOSIS — K429 Umbilical hernia without obstruction or gangrene: Secondary | ICD-10-CM | POA: Insufficient documentation

## 2017-03-18 DIAGNOSIS — Z85118 Personal history of other malignant neoplasm of bronchus and lung: Secondary | ICD-10-CM | POA: Diagnosis not present

## 2017-03-18 DIAGNOSIS — K439 Ventral hernia without obstruction or gangrene: Secondary | ICD-10-CM | POA: Diagnosis not present

## 2017-03-18 DIAGNOSIS — J449 Chronic obstructive pulmonary disease, unspecified: Secondary | ICD-10-CM | POA: Diagnosis not present

## 2017-03-18 DIAGNOSIS — I10 Essential (primary) hypertension: Secondary | ICD-10-CM | POA: Diagnosis not present

## 2017-03-18 DIAGNOSIS — K436 Other and unspecified ventral hernia with obstruction, without gangrene: Secondary | ICD-10-CM | POA: Diagnosis not present

## 2017-03-18 DIAGNOSIS — Z7982 Long term (current) use of aspirin: Secondary | ICD-10-CM | POA: Diagnosis not present

## 2017-03-18 DIAGNOSIS — E119 Type 2 diabetes mellitus without complications: Secondary | ICD-10-CM | POA: Diagnosis not present

## 2017-03-18 DIAGNOSIS — G4733 Obstructive sleep apnea (adult) (pediatric): Secondary | ICD-10-CM | POA: Diagnosis not present

## 2017-03-18 DIAGNOSIS — Z87891 Personal history of nicotine dependence: Secondary | ICD-10-CM | POA: Diagnosis not present

## 2017-03-18 DIAGNOSIS — Z6841 Body Mass Index (BMI) 40.0 and over, adult: Secondary | ICD-10-CM | POA: Diagnosis not present

## 2017-03-18 DIAGNOSIS — Z4651 Encounter for fitting and adjustment of gastric lap band: Secondary | ICD-10-CM | POA: Diagnosis not present

## 2017-04-08 DIAGNOSIS — E860 Dehydration: Secondary | ICD-10-CM | POA: Diagnosis not present

## 2017-04-12 DIAGNOSIS — M8580 Other specified disorders of bone density and structure, unspecified site: Secondary | ICD-10-CM | POA: Insufficient documentation

## 2017-04-12 DIAGNOSIS — J45909 Unspecified asthma, uncomplicated: Secondary | ICD-10-CM | POA: Insufficient documentation

## 2017-04-12 DIAGNOSIS — K589 Irritable bowel syndrome without diarrhea: Secondary | ICD-10-CM | POA: Insufficient documentation

## 2017-04-12 DIAGNOSIS — Z6841 Body Mass Index (BMI) 40.0 and over, adult: Secondary | ICD-10-CM | POA: Insufficient documentation

## 2017-04-13 DIAGNOSIS — Z713 Dietary counseling and surveillance: Secondary | ICD-10-CM | POA: Diagnosis not present

## 2017-04-13 DIAGNOSIS — E119 Type 2 diabetes mellitus without complications: Secondary | ICD-10-CM | POA: Diagnosis not present

## 2017-04-13 DIAGNOSIS — Z9884 Bariatric surgery status: Secondary | ICD-10-CM | POA: Diagnosis not present

## 2017-04-26 DIAGNOSIS — Z0000 Encounter for general adult medical examination without abnormal findings: Secondary | ICD-10-CM | POA: Diagnosis not present

## 2017-04-30 ENCOUNTER — Other Ambulatory Visit: Payer: Self-pay | Admitting: Internal Medicine

## 2017-04-30 DIAGNOSIS — R0600 Dyspnea, unspecified: Secondary | ICD-10-CM

## 2017-05-03 ENCOUNTER — Encounter: Payer: Self-pay | Admitting: Internal Medicine

## 2017-05-03 ENCOUNTER — Ambulatory Visit (INDEPENDENT_AMBULATORY_CARE_PROVIDER_SITE_OTHER): Payer: Medicare Other | Admitting: Emergency Medicine

## 2017-05-03 ENCOUNTER — Ambulatory Visit (INDEPENDENT_AMBULATORY_CARE_PROVIDER_SITE_OTHER): Payer: Medicare Other | Admitting: Internal Medicine

## 2017-05-03 VITALS — BP 120/70 | HR 68 | Ht 60.0 in | Wt 203.0 lb

## 2017-05-03 DIAGNOSIS — J449 Chronic obstructive pulmonary disease, unspecified: Secondary | ICD-10-CM

## 2017-05-03 DIAGNOSIS — G4733 Obstructive sleep apnea (adult) (pediatric): Secondary | ICD-10-CM

## 2017-05-03 DIAGNOSIS — Z23 Encounter for immunization: Secondary | ICD-10-CM

## 2017-05-03 DIAGNOSIS — R0600 Dyspnea, unspecified: Secondary | ICD-10-CM | POA: Diagnosis not present

## 2017-05-03 LAB — PULMONARY FUNCTION TEST
DL/VA % pred: 60 %
DL/VA: 2.58 ml/min/mmHg/L
DLCO COR % PRED: 59 %
DLCO COR: 11.19 ml/min/mmHg
DLCO UNC % PRED: 59 %
DLCO unc: 11.19 ml/min/mmHg
FEF 25-75 POST: 0.76 L/s
FEF 25-75 Pre: 0.72 L/sec
FEF2575-%Change-Post: 5 %
FEF2575-%PRED-PRE: 53 %
FEF2575-%Pred-Post: 56 %
FEV1-%Change-Post: 3 %
FEV1-%PRED-POST: 109 %
FEV1-%Pred-Pre: 105 %
FEV1-POST: 1.58 L
FEV1-Pre: 1.52 L
FEV1FVC-%CHANGE-POST: 4 %
FEV1FVC-%PRED-PRE: 73 %
FEV6-%Change-Post: -1 %
FEV6-%Pred-Post: 148 %
FEV6-%Pred-Pre: 150 %
FEV6-POST: 2.65 L
FEV6-Pre: 2.68 L
FEV6FVC-%Change-Post: 0 %
FEV6FVC-%PRED-POST: 103 %
FEV6FVC-%Pred-Pre: 104 %
FVC-%Change-Post: 0 %
FVC-%PRED-POST: 144 %
FVC-%PRED-PRE: 144 %
FVC-POST: 2.7 L
FVC-PRE: 2.7 L
PRE FEV1/FVC RATIO: 56 %
Post FEV1/FVC ratio: 59 %
Post FEV6/FVC ratio: 98 %
Pre FEV6/FVC Ratio: 99 %
RV % pred: 85 %
RV: 1.74 L
TLC % PRED: 104 %
TLC: 4.64 L

## 2017-05-03 NOTE — Patient Instructions (Addendum)
We can continue present meds  Flu vax- senior  Please call if we can help

## 2017-05-03 NOTE — Progress Notes (Signed)
HPI female former smoker followed for COPD, history left lower lobe resection/chemotherapy 4496 NSCCa, complicated by DM, obesity/lap band surgery PFT 07/05/2008-moderate obstructive airways disease with response to bronchodilator, diffusion moderately reduced. FEV1 1.75/87%, FEV1/FVC 0.53, DLCO 49 Office Spirometry 07/04/2015-mild airway obstruction. FEV1/FVC 0.55 Office Spirometry 03/06/2016- mild obstructive airways disease-FVC 2.01/104%, FEV1 1.16/78%, ratio 0.58, FEF 25-75% 0.61/ 43% NPSG 07/03/13- AHI 5.3/ hr, weight 234 pounds  ----------------------------------------------------------------------------------------------------------------------  10/26/2016-70  female former smoker followed for COPD, history left lower lobe resection/chemotherapy 7591 NSCCa, complicated by DM, obesity/lap band surgery 6 month follow up for COPD. States she has more SOB when walking that usual. Denies any chest pain.  Had a bad cough back in March 2018 and saw Dr. Melvyn Novas. States the cough is now gone.  She has been aware of gradually increasing dyspnea on exertion over the past year. Cardiology is following. She thinks the problem is her weight and is pending insurance approval for gastric bypass after weight gain following remote lap band surgery Denies cough, wheeze, chest pain or palpitation. Mainly she notices dyspnea on exertion, not at rest. Her walker helps. CTa chest- 09/23/2016 IMPRESSION: 1. No acute pulmonary embolus. 2. Status post left lower lobectomy with pulmonary scarring. COPD. No acute pneumonic consolidation, pneumothorax nor effusion.  05/03/17- 6  female former smoker followed for COPD, history left lower lobe resection/chemotherapy 6384 NSCCa, complicated by DM 2, obesity / lap band surgery COPD mixed type; review PFT. Pt continues to have SOB with exertion  Respiratory triggers include strong odors.  She uses fan and mask to do housecleaning. Stable dyspnea with exertion.  PFT reviewed.   Not anemic and no history of heart disease. Bevespi, nebulizer CTa chest 09/23/16 IMPRESSION: 1. No acute pulmonary embolus. 2. Status post left lower lobectomy with pulmonary scarring. COPD. No acute pneumonic consolidation, pneumothorax nor effusion. PFT 05/03/17-mild obstructive airways disease, no response to bronchodilator.  Diffusion moderately reduced.  FEV1/FVC 0.59, DLCO 59%  ROS-see HPI + = positive Constitutional:   No-   weight loss, night sweats, fevers, chills,+ fatigue, lassitude. HEENT:   No-  headaches, difficulty swallowing, tooth/dental problems, sore throat,       No-  sneezing, itching, ear ache, +nasal congestion, post nasal drip,  CV:  No-   chest pain, orthopnea, PND, swelling in lower extremities, anasarca, dizziness, palpitations Resp: +  shortness of breath with exertion or at rest.        productive cough,  non-productive cough,   coughing up of blood.              No-   change in color of mucus. wheezing.   Skin: No-   rash or lesions. GI:  No-   heartburn, indigestion, abdominal pain, nausea, vomiting, GU: . MS:  No-   joint pain or swelling.   cramping myalgias Neuro-     nothing unusual Psych:  No- change in mood or affect. No depression or anxiety.  No memory loss.  OBJ General- Alert, Oriented, Affect-appropriate, Distress- none acute, +obese Skin- rash-none, lesions- none, excoriation- none.  Lymphadenopathy- none Head- atraumatic            Eyes- Gross vision intact, PERRLA, conjunctivae clear secretions            Ears- Hearing, canals-normal            Nose- Clear, no-Septal dev, mucus, polyps, erosion, perforation             Throat- Mallampati III-IV , mucosa + red ,  drainage- none, tonsils- atrophic Neck- flexible , trachea midline, no stridor , thyroid nl, carotid no bruit Chest - symmetrical excursion , unlabored           Heart/CV- RRR , no murmur , no gallop  , no rub, nl s1 s2                           - JVD- none , edema- none, stasis  changes- none, varices- none           Lung- clear, wheeze- none, cough-none , dullness-none, rub- none           Chest wall- scar left chest Port-A-Cath Abd-  Br/ Gen/ Rectal- Not done, not indicated Extrem- cyanosis- none, clubbing, none, atrophy- none, strength- nl Neuro- grossly intact to observation

## 2017-05-03 NOTE — Patient Instructions (Signed)
PFT done today. 

## 2017-05-11 ENCOUNTER — Other Ambulatory Visit: Payer: Self-pay | Admitting: Internal Medicine

## 2017-05-12 NOTE — Telephone Encounter (Signed)
Ok to refill 

## 2017-05-12 NOTE — Telephone Encounter (Signed)
CY please advise on refill. Thanks.

## 2017-05-25 DIAGNOSIS — K429 Umbilical hernia without obstruction or gangrene: Secondary | ICD-10-CM | POA: Diagnosis not present

## 2017-05-25 DIAGNOSIS — I872 Venous insufficiency (chronic) (peripheral): Secondary | ICD-10-CM | POA: Diagnosis not present

## 2017-05-25 DIAGNOSIS — E118 Type 2 diabetes mellitus with unspecified complications: Secondary | ICD-10-CM | POA: Diagnosis not present

## 2017-05-25 DIAGNOSIS — J449 Chronic obstructive pulmonary disease, unspecified: Secondary | ICD-10-CM | POA: Diagnosis not present

## 2017-06-20 NOTE — Assessment & Plan Note (Signed)
Conservative therapy-sleep off a lot of back, encourage weight loss

## 2017-06-20 NOTE — Assessment & Plan Note (Signed)
She continues to have mild flow limitation on PFT, but diffusion is moderately reduced.  This reflects previous smoking and her lobectomy for lung cancer. Plan-encourage walking for endurance and weight control.  Flu vaccine.

## 2017-07-15 DIAGNOSIS — M79605 Pain in left leg: Secondary | ICD-10-CM | POA: Diagnosis not present

## 2017-07-15 DIAGNOSIS — J449 Chronic obstructive pulmonary disease, unspecified: Secondary | ICD-10-CM | POA: Diagnosis not present

## 2017-07-15 DIAGNOSIS — E118 Type 2 diabetes mellitus with unspecified complications: Secondary | ICD-10-CM | POA: Diagnosis not present

## 2017-07-17 DIAGNOSIS — M79609 Pain in unspecified limb: Secondary | ICD-10-CM | POA: Diagnosis not present

## 2017-07-17 DIAGNOSIS — E7210 Disorders of sulfur-bearing amino-acid metabolism, unspecified: Secondary | ICD-10-CM | POA: Diagnosis not present

## 2017-07-21 ENCOUNTER — Other Ambulatory Visit: Payer: Self-pay

## 2017-07-21 DIAGNOSIS — H25813 Combined forms of age-related cataract, bilateral: Secondary | ICD-10-CM | POA: Diagnosis not present

## 2017-07-21 DIAGNOSIS — H04123 Dry eye syndrome of bilateral lacrimal glands: Secondary | ICD-10-CM | POA: Diagnosis not present

## 2017-07-21 DIAGNOSIS — M79605 Pain in left leg: Secondary | ICD-10-CM | POA: Diagnosis not present

## 2017-07-21 DIAGNOSIS — M79604 Pain in right leg: Secondary | ICD-10-CM | POA: Diagnosis not present

## 2017-07-21 DIAGNOSIS — E119 Type 2 diabetes mellitus without complications: Secondary | ICD-10-CM | POA: Diagnosis not present

## 2017-07-21 DIAGNOSIS — H40013 Open angle with borderline findings, low risk, bilateral: Secondary | ICD-10-CM | POA: Diagnosis not present

## 2017-07-22 ENCOUNTER — Ambulatory Visit (INDEPENDENT_AMBULATORY_CARE_PROVIDER_SITE_OTHER)
Admission: RE | Admit: 2017-07-22 | Discharge: 2017-07-22 | Disposition: A | Discharge status: Home / Self Care | Payer: Medicare Other | Source: Ambulatory Visit | Attending: Vascular Surgery | Admitting: Vascular Surgery

## 2017-07-22 ENCOUNTER — Ambulatory Visit (HOSPITAL_COMMUNITY)
Admission: RE | Admit: 2017-07-22 | Discharge: 2017-07-22 | Disposition: A | Discharge status: Home / Self Care | Payer: Medicare Other | Source: Ambulatory Visit | Attending: Vascular Surgery | Admitting: Vascular Surgery

## 2017-07-22 DIAGNOSIS — M79605 Pain in left leg: Secondary | ICD-10-CM | POA: Diagnosis not present

## 2017-07-22 LAB — VAS US LOWER EXTREMITY ARTERIAL DUPLEX
LATIBDISTSYS: -112 cm/s
LPTIBDISTSYS: -80 cm/s
Left super femoral dist sys PSV: -80 cm/s
Left super femoral mid sys PSV: -74 cm/s
Left super femoral prox sys PSV: 64 cm/s
RIGHT ANT DIST TIBAL SYS PSV: 65 cm/s
RIGHT POST TIB DIST SYS: -76 cm/s
RSFDPSV: -85 cm/s
RSFMPSV: -66 cm/s
Right super femoral prox sys PSV: -67 cm/s

## 2017-07-29 DIAGNOSIS — Z1231 Encounter for screening mammogram for malignant neoplasm of breast: Secondary | ICD-10-CM | POA: Diagnosis not present

## 2017-07-29 DIAGNOSIS — M8580 Other specified disorders of bone density and structure, unspecified site: Secondary | ICD-10-CM | POA: Diagnosis not present

## 2017-07-29 DIAGNOSIS — Z6838 Body mass index (BMI) 38.0-38.9, adult: Secondary | ICD-10-CM | POA: Diagnosis not present

## 2017-07-29 DIAGNOSIS — Z01419 Encounter for gynecological examination (general) (routine) without abnormal findings: Secondary | ICD-10-CM | POA: Diagnosis not present

## 2017-08-02 NOTE — Congregational Nurse Program (Signed)
Congregational Nurse Program Note  Date of Encounter: 08/02/2017  Past Medical History: Past Medical History:  Diagnosis Date  . Abdominal pain    around naval  . Arthritis   . Breast pain in female   . Bruises easily   . Cancer ([place]) 2003   lung  . Cervical disc disease   . Chronic depression   . COPD (chronic obstructive pulmonary disease) ([place])   . Diabetes mellitus   . DJD (degenerative joint disease)   . Gum disease   . Hernia   . Insomnia   . Phlebitis   . Thyroid disease   . Wears glasses   . Weight increase     Encounter Details: CNP Questionnaire - 08/02/17 1553      Questionnaire   Patient Status  Not Applicable    Race  Black or African American    Location Patient Served At  [employer]    Uninsured  Not Applicable    Food  No food insecurities    Housing/Utilities  Yes, have permanent housing    Interpersonal Safety  Yes, feel physically and emotionally safe where you currently live    Medication  No medication insecurities    Medical Provider  Yes    Referrals  Not Applicable    ED Visit Averted  Not Applicable    Life-Saving Intervention Made  Not Applicable       Talk with her about her diabetes. Encouraged to keep snacks with her because she has been having problems it dropping a lot. Will follow with her to make sure she is doing okay with this. She has seen her Wilderness Rim PhD, RN BSN [facility] CN [phone].

## 2017-08-20 ENCOUNTER — Encounter: Payer: Medicare Other | Admitting: Vascular Surgery

## 2017-10-19 DIAGNOSIS — J449 Chronic obstructive pulmonary disease, unspecified: Secondary | ICD-10-CM | POA: Diagnosis not present

## 2017-10-19 DIAGNOSIS — E118 Type 2 diabetes mellitus with unspecified complications: Secondary | ICD-10-CM | POA: Diagnosis not present

## 2017-10-27 DIAGNOSIS — Z9884 Bariatric surgery status: Secondary | ICD-10-CM | POA: Diagnosis not present

## 2017-10-27 DIAGNOSIS — E162 Hypoglycemia, unspecified: Secondary | ICD-10-CM | POA: Diagnosis not present

## 2017-11-23 DIAGNOSIS — J449 Chronic obstructive pulmonary disease, unspecified: Secondary | ICD-10-CM | POA: Diagnosis not present

## 2017-11-23 DIAGNOSIS — E162 Hypoglycemia, unspecified: Secondary | ICD-10-CM | POA: Diagnosis not present

## 2017-11-23 DIAGNOSIS — E7210 Disorders of sulfur-bearing amino-acid metabolism, unspecified: Secondary | ICD-10-CM | POA: Diagnosis not present

## 2017-11-23 DIAGNOSIS — E118 Type 2 diabetes mellitus with unspecified complications: Secondary | ICD-10-CM | POA: Diagnosis not present

## 2017-11-23 DIAGNOSIS — K429 Umbilical hernia without obstruction or gangrene: Secondary | ICD-10-CM | POA: Diagnosis not present

## 2017-11-24 ENCOUNTER — Other Ambulatory Visit: Payer: Self-pay | Admitting: Family Medicine

## 2017-11-24 DIAGNOSIS — E162 Hypoglycemia, unspecified: Secondary | ICD-10-CM

## 2017-11-25 ENCOUNTER — Ambulatory Visit
Admission: RE | Admit: 2017-11-25 | Discharge: 2017-11-25 | Disposition: A | Discharge status: Home / Self Care | Payer: Medicare Other | Source: Ambulatory Visit | Attending: Family Medicine | Admitting: Family Medicine

## 2017-11-25 DIAGNOSIS — E162 Hypoglycemia, unspecified: Secondary | ICD-10-CM

## 2017-11-25 DIAGNOSIS — N2889 Other specified disorders of kidney and ureter: Secondary | ICD-10-CM | POA: Diagnosis not present

## 2017-11-25 MED ORDER — IOPAMIDOL (ISOVUE-300) INJECTION 61%
100.0000 mL | Freq: Once | INTRAVENOUS | Status: AC | PRN
  Administered 2017-11-25: 100 mL via INTRAVENOUS

## 2017-12-08 DIAGNOSIS — R9431 Abnormal electrocardiogram [ECG] [EKG]: Secondary | ICD-10-CM | POA: Diagnosis not present

## 2017-12-08 DIAGNOSIS — Z902 Acquired absence of lung [part of]: Secondary | ICD-10-CM | POA: Diagnosis not present

## 2017-12-08 DIAGNOSIS — R42 Dizziness and giddiness: Secondary | ICD-10-CM | POA: Diagnosis not present

## 2017-12-08 DIAGNOSIS — Z9884 Bariatric surgery status: Secondary | ICD-10-CM | POA: Diagnosis not present

## 2017-12-09 DIAGNOSIS — R55 Syncope and collapse: Secondary | ICD-10-CM | POA: Diagnosis not present

## 2017-12-16 DIAGNOSIS — K1379 Other lesions of oral mucosa: Secondary | ICD-10-CM | POA: Diagnosis not present

## 2017-12-30 ENCOUNTER — Encounter: Payer: Self-pay | Admitting: Internal Medicine

## 2017-12-30 ENCOUNTER — Ambulatory Visit (INDEPENDENT_AMBULATORY_CARE_PROVIDER_SITE_OTHER): Payer: Medicare Other | Admitting: Internal Medicine

## 2017-12-30 VITALS — BP 126/84 | HR 60 | Ht 60.0 in | Wt 203.0 lb

## 2017-12-30 DIAGNOSIS — R1313 Dysphagia, pharyngeal phase: Secondary | ICD-10-CM | POA: Diagnosis not present

## 2017-12-30 DIAGNOSIS — R1310 Dysphagia, unspecified: Secondary | ICD-10-CM | POA: Insufficient documentation

## 2017-12-30 DIAGNOSIS — J449 Chronic obstructive pulmonary disease, unspecified: Secondary | ICD-10-CM

## 2017-12-30 MED ORDER — HYDROCODONE-CHLORPHENIRAMINE 5-4 MG/5ML PO SOLN: 5.0000 mL | Freq: Two times a day (BID) | ORAL | 0 refills | Status: DC | PRN

## 2017-12-30 NOTE — Patient Instructions (Addendum)
We can continue present meds  Please call if we can help  [facility] parking renewed  Script printed to refill cough syrup  Order- please schedule Speech Therapy assisted Modified Barium Swallow   Dx dysphagia           You can discuss the results of this test with your GI doctor

## 2017-12-30 NOTE — Progress Notes (Signed)
HPI female former smoker followed for COPD, history left lower lobe resection/chemotherapy 7591 NSCCa, complicated by DM, obesity/lap band surgery PFT 07/05/2008-moderate obstructive airways disease with response to bronchodilator, diffusion moderately reduced. FEV1 1.75/87%, FEV1/FVC 0.53, DLCO 49 Office Spirometry 07/04/2015-mild airway obstruction. FEV1/FVC 0.55 Office Spirometry 03/06/2016- mild obstructive airways disease-FVC 2.01/104%, FEV1 1.16/78%, ratio 0.58, FEF 25-75% 0.61/ 43% NPSG 07/03/13- AHI 5.3/ hr, weight 234 pounds PFT 05/03/17-mild obstructive airways disease, no response to bronchodilator.  Diffusion moderately reduced.  FEV1/FVC 0.59, DLCO 59%  ---------------------------------------------------------------------------------------------------------------------- 05/03/17- 70  female former smoker followed for COPD, history left lower lobe resection/chemotherapy 6384 NSCCa, complicated by DM 2, obesity / lap band surgery COPD mixed type; review PFT. Pt continues to have SOB with exertion  Respiratory triggers include strong odors.  She uses fan and mask to do housecleaning. Stable dyspnea with exertion.  PFT reviewed.  Not anemic and no history of heart disease. Bevespi, nebulizer CTa chest 09/23/16 IMPRESSION: 1. No acute pulmonary embolus. 2. Status post left lower lobectomy with pulmonary scarring. COPD. No acute pneumonic consolidation, pneumothorax nor effusion. PFT 05/03/17-mild obstructive airways disease, no response to bronchodilator.  Diffusion moderately reduced.  FEV1/FVC 0.59, DLCO 59%  12/30/2017- 65  female former smoker followed for COPD, history left lower lobe resection/chemotherapy 6659 NSCCa, complicated by DM 2, obesity / lap band surgery -----COPD. Her breathing is unchanged. No new co's. She rarely uses her albuterol inhaler or neb. Cough is not bothersome today, but she req refill on hydrocodone cough syrup to have on hand prn.  Asks renewal [facility]  parking Bevespi, neb albuterol, Proair Respiclick,  Occasionally bothered by pollen this spring but never bad.  Using rescue inhaler only occasionally.  Has had some drops in blood sugar and is wearing monitor tracking that. Dysphagia on swallowing Scherzer sometimes because she is alone.  She tries to do Heimlich on herself leaning over back of chair when this happens.  Feels difficulty mostly at the level of the larynx.  ROS-see HPI + = positive Constitutional:   No-   weight loss, night sweats, fevers, chills,+ fatigue, lassitude. HEENT:   No-  headaches, +difficulty swallowing, tooth/dental problems, sore throat,       No-  sneezing, itching, ear ache, +nasal congestion, post nasal drip,  CV:  No-   chest pain, orthopnea, PND, swelling in lower extremities, anasarca, dizziness, palpitations Resp: +  shortness of breath with exertion or at rest.        productive cough,  non-productive cough,   coughing up of blood.              No-   change in color of mucus. wheezing.   Skin: No-   rash or lesions. GI:  No-   heartburn, indigestion, abdominal pain, nausea, vomiting, GU: . MS:  No-   joint pain or swelling.   cramping myalgias Neuro-     nothing unusual Psych:  No- change in mood or affect. No depression or anxiety.  No memory loss.  OBJ General- Alert, Oriented, Affect-appropriate, Distress- none acute, +obese Skin- rash-none, lesions- none, excoriation- none.  Lymphadenopathy- none Head- atraumatic            Eyes- Gross vision intact, PERRLA, conjunctivae clear secretions            Ears- Hearing, canals-normal            Nose- Clear, no-Septal dev, mucus, polyps, erosion, perforation             Throat-  Mallampati III-IV , mucosa + red , drainage- none, tonsils- atrophic Neck- flexible , trachea midline, no stridor , thyroid nl, carotid no bruit Chest - symmetrical excursion , unlabored           Heart/CV- RRR , no murmur , no gallop  , no rub, nl s1 s2                            - JVD- none , edema- none, stasis changes- none, varices- none           Lung- clear, wheeze- none, cough-none , dullness-none, rub- none           Chest wall- scar left chest Port-A-Cath Abd-  Br/ Gen/ Rectal- Not done, not indicated Extrem- cyanosis- none, clubbing, none, atrophy- none, strength- nl Neuro- grossly intact to observation

## 2017-12-30 NOTE — Assessment & Plan Note (Signed)
I think she is describing oral phase dysphagia, possibly on the basis of esophageal dysmotility.  Concerning that she has felt need to try to do Heimlich on herself, living alone. Plan-we can help schedule modified barium swallow with speech therapy.  She has seen a GI for colonoscopies and can take the results there for follow-up.

## 2017-12-30 NOTE — Assessment & Plan Note (Signed)
Has felt very stable without exacerbations.  Not much exposure to sick people.  She will hang on to current meds.

## 2017-12-31 ENCOUNTER — Other Ambulatory Visit (HOSPITAL_COMMUNITY): Payer: Self-pay | Admitting: Internal Medicine

## 2017-12-31 DIAGNOSIS — R1310 Dysphagia, unspecified: Secondary | ICD-10-CM

## 2018-01-06 ENCOUNTER — Other Ambulatory Visit: Payer: Self-pay | Admitting: Internal Medicine

## 2018-01-06 DIAGNOSIS — K1329 Other disturbances of oral epithelium, including tongue: Secondary | ICD-10-CM | POA: Diagnosis not present

## 2018-01-06 DIAGNOSIS — K1379 Other lesions of oral mucosa: Secondary | ICD-10-CM | POA: Diagnosis not present

## 2018-01-06 DIAGNOSIS — R55 Syncope and collapse: Secondary | ICD-10-CM | POA: Diagnosis not present

## 2018-01-07 ENCOUNTER — Ambulatory Visit (HOSPITAL_COMMUNITY)
Admission: RE | Admit: 2018-01-07 | Discharge: 2018-01-07 | Disposition: A | Discharge status: Home / Self Care | Payer: Medicare Other | Source: Ambulatory Visit | Attending: Internal Medicine | Admitting: Internal Medicine

## 2018-01-07 DIAGNOSIS — J449 Chronic obstructive pulmonary disease, unspecified: Secondary | ICD-10-CM | POA: Diagnosis not present

## 2018-01-07 DIAGNOSIS — R1310 Dysphagia, unspecified: Secondary | ICD-10-CM

## 2018-01-07 DIAGNOSIS — E119 Type 2 diabetes mellitus without complications: Secondary | ICD-10-CM | POA: Diagnosis not present

## 2018-01-07 NOTE — Therapy (Signed)
Objective Swallowing Evaluation: Type of Study: MBS-Modified Barium Swallow Study   Patient Details  Name: [name] MRN: [id number] Date of Birth: January 31, 1946  Today's Date: 01/07/2018 Time: SLP Start Time (ACUTE ONLY): 1250 -SLP Stop Time (ACUTE ONLY): 1320  SLP Time Calculation (min) (ACUTE ONLY): 30 min   Past Medical History:  Past Medical History:  Diagnosis Date  . Abdominal pain    around naval  . Arthritis   . Breast pain in female   . Bruises easily   . Cancer ([place]) 2003   lung  . Cervical disc disease   . Chronic depression   . COPD (chronic obstructive pulmonary disease) ([place])   . Diabetes mellitus   . DJD (degenerative joint disease)   . Gum disease   . Hernia   . Insomnia   . Phlebitis   . Thyroid disease   . Wears glasses   . Weight increase    Past Surgical History:  Past Surgical History:  Procedure Laterality Date  . ABDOMINAL HYSTERECTOMY     partial  . FOOT SURGERY     right  . LAPAROSCOPIC GASTRIC BANDING  Feb 2010  . LEFT HEART CATHETERIZATION WITH CORONARY ANGIOGRAM N/A 05/02/2012   Procedure: LEFT HEART CATHETERIZATION WITH CORONARY ANGIOGRAM;  Surgeon: Laverda Page, MD;  Location: [facility] CATH LAB;  Service: Cardiovascular;  Laterality: N/A;  . LUNG LOBECTOMY  2003   left  . SPINE SURGERY     fusion on the neck  . THYROIDECTOMY     partial  . TONSILLECTOMY     HPI: The patient was seen for outpatient MBS due to concerns for dysphagia.  The patient has had several episodes of choking where she felt it necessary to perform the heimilch maneuver on herself.  She endorses intermittent issues with feeling choked with intake.  There are no mitigating or relieving factors.  She stated it can happen just with saliva.  She does not report history of GERD but does have COPD that she takes inhalers for.  She has a history of lung cancer that was treated in 2003 as well as an ACDF in the 1990s.  She had mouth surgery yesterday for biopsy to  biopsy a lesion on her gums that she currently has stitches for.    Subjective: The patient was seen as an outpatient in radiology for MBS.      Assessment / Plan / Recommendation  CHL IP CLINICAL IMPRESSIONS 01/07/2018  Clinical Impression MBS was completed using thin liquids via spoon, cup and straw, pureed material and dual textured solids.  Of note, patient had oral surgery yesterday to biopsy lesion on left back upper gum line with sticthes present.  Patient with complaints of mild pain.  The patient's oral and pharyngeal swallow appeared to be functional.  Mastication of dry solids was functional even given oral surgery yesterday given softer solids.  No oral residue was seen and good lingual contol/movement observed.  Swallow trigger was timely and no pharyngeal residue was seen.  No penetration or aspiration seen.  Esohpageal sweep revealed it slow to clear.  63mm barium tablet hung at mid esophagus breifly which patient was sensate too but second liquid bolus easily cleared tablet into stomach.  Recommend a regular diet with thin liquids.  Patient was encouraged to alternate food and liquids to facilitate clearance of esophagus.  ST follow up is not indicated.     SLP Visit Diagnosis Dysphagia, unspecified (R13.10)  Attention and concentration deficit following --  Frontal lobe and executive function deficit following --  Impact on safety and function No limitations      CHL IP TREATMENT RECOMMENDATION 01/07/2018  Treatment Recommendations No treatment recommended at this time     No flowsheet data found.  CHL IP DIET RECOMMENDATION 01/07/2018  SLP Diet Recommendations Regular solids;Thin liquid  Liquid Administration via Cup;Straw  Medication Administration Whole meds with liquid  Compensations --  Postural Changes Remain semi-upright after after feeds/meals (Comment);Seated upright at 90 degrees      CHL IP OTHER RECOMMENDATIONS 01/07/2018  Recommended Consults Consider GI  evaluation  Oral Care Recommendations Oral care BID  Other Recommendations --      CHL IP FOLLOW UP RECOMMENDATIONS 01/07/2018  Follow up Recommendations None      No flowsheet data found.         CHL IP ORAL PHASE 01/07/2018  Oral Phase WFL  Oral - Pudding Teaspoon --  Oral - Pudding Cup --  Oral - Honey Teaspoon --  Oral - Honey Cup --  Oral - Nectar Teaspoon --  Oral - Nectar Cup --  Oral - Nectar Straw --  Oral - Thin Teaspoon --  Oral - Thin Cup --  Oral - Thin Straw --  Oral - Puree --  Oral - Mech Soft --  Oral - Regular --  Oral - Multi-Consistency --  Oral - Pill --  Oral Phase - Comment --    CHL IP PHARYNGEAL PHASE 01/07/2018  Pharyngeal Phase WFL  Pharyngeal- Pudding Teaspoon --  Pharyngeal --  Pharyngeal- Pudding Cup --  Pharyngeal --  Pharyngeal- Honey Teaspoon --  Pharyngeal --  Pharyngeal- Honey Cup --  Pharyngeal --  Pharyngeal- Nectar Teaspoon --  Pharyngeal --  Pharyngeal- Nectar Cup --  Pharyngeal --  Pharyngeal- Nectar Straw --  Pharyngeal --  Pharyngeal- Thin Teaspoon --  Pharyngeal --  Pharyngeal- Thin Cup --  Pharyngeal --  Pharyngeal- Thin Straw --  Pharyngeal --  Pharyngeal- Puree --  Pharyngeal --  Pharyngeal- Mechanical Soft --  Pharyngeal --  Pharyngeal- Regular --  Pharyngeal --  Pharyngeal- Multi-consistency --  Pharyngeal --  Pharyngeal- Pill --  Pharyngeal --  Pharyngeal Comment --     CHL IP CERVICAL ESOPHAGEAL PHASE 01/07/2018  Cervical Esophageal Phase WFL  Pudding Teaspoon --  Pudding Cup --  Honey Teaspoon --  Honey Cup --  Nectar Teaspoon --  Nectar Cup --  Nectar Straw --  Thin Teaspoon --  Thin Cup --  Thin Straw --  Puree --  Mechanical Soft --  Regular --  Multi-consistency --  Pill --  Cervical Esophageal Comment --    No flowsheet data found.  Shelly Flatten, MA, [facility] SLP [phone]  Lamar Sprinkles 01/07/2018, 1:36 PM

## 2018-01-12 NOTE — Progress Notes (Signed)
LMTCB

## 2018-01-13 ENCOUNTER — Telehealth: Payer: Self-pay | Admitting: Internal Medicine

## 2018-01-13 DIAGNOSIS — K1329 Other disturbances of oral epithelium, including tongue: Secondary | ICD-10-CM | POA: Diagnosis not present

## 2018-01-13 NOTE — Telephone Encounter (Signed)
Notes recorded by Rosana Berger, CMA on 01/12/2018 at 9:53 AM EDT LMTCB ------  Notes recorded by Riley Kill, CMA on 01/07/2018 at 4:29 PM EDT Called patient, unable to reach left message to give [place] a call back. ------  Notes recorded by Deneise Lever, MD on 01/07/2018 at 3:46 PM EDT Swallowing evaluation study was basically normal. Chew and swallow while sitting upright, not lying down or in recliner.  Pt aware of results. No further questions or concerns at this time.

## 2018-02-04 DIAGNOSIS — E119 Type 2 diabetes mellitus without complications: Secondary | ICD-10-CM | POA: Diagnosis not present

## 2018-02-04 DIAGNOSIS — E669 Obesity, unspecified: Secondary | ICD-10-CM | POA: Diagnosis not present

## 2018-02-04 DIAGNOSIS — Z713 Dietary counseling and surveillance: Secondary | ICD-10-CM | POA: Diagnosis not present

## 2018-02-04 DIAGNOSIS — Z9884 Bariatric surgery status: Secondary | ICD-10-CM | POA: Diagnosis not present

## 2018-02-10 ENCOUNTER — Ambulatory Visit: Payer: Medicare Other | Admitting: Internal Medicine

## 2018-02-14 DIAGNOSIS — K1329 Other disturbances of oral epithelium, including tongue: Secondary | ICD-10-CM | POA: Diagnosis not present

## 2018-02-14 DIAGNOSIS — K1379 Other lesions of oral mucosa: Secondary | ICD-10-CM | POA: Diagnosis not present

## 2018-02-22 DIAGNOSIS — K1329 Other disturbances of oral epithelium, including tongue: Secondary | ICD-10-CM | POA: Diagnosis not present

## 2018-02-25 DIAGNOSIS — R55 Syncope and collapse: Secondary | ICD-10-CM | POA: Diagnosis not present

## 2018-02-25 DIAGNOSIS — R9431 Abnormal electrocardiogram [ECG] [EKG]: Secondary | ICD-10-CM | POA: Diagnosis not present

## 2018-02-25 DIAGNOSIS — I739 Peripheral vascular disease, unspecified: Secondary | ICD-10-CM | POA: Diagnosis not present

## 2018-03-02 NOTE — Congregational Nurse Program (Signed)
Client very concern about her lower extremities. She had  a problem over the weekend but was abe to avoid going to the ER. Legs are swollen but she will be seeing her medical doctor.Reinforced the importance of being careful because of her diabetes and circulation problems. I will continue   Follow up with her. [address] RN BSN CN [facility] PhD. [phone].

## 2018-03-04 DIAGNOSIS — R55 Syncope and collapse: Secondary | ICD-10-CM | POA: Diagnosis not present

## 2018-03-04 DIAGNOSIS — R9431 Abnormal electrocardiogram [ECG] [EKG]: Secondary | ICD-10-CM | POA: Diagnosis not present

## 2018-03-04 DIAGNOSIS — R42 Dizziness and giddiness: Secondary | ICD-10-CM | POA: Diagnosis not present

## 2018-03-04 DIAGNOSIS — E11649 Type 2 diabetes mellitus with hypoglycemia without coma: Secondary | ICD-10-CM | POA: Diagnosis not present

## 2018-03-07 ENCOUNTER — Encounter (HOSPITAL_COMMUNITY): Payer: Self-pay

## 2018-03-07 DIAGNOSIS — Z5321 Procedure and treatment not carried out due to patient leaving prior to being seen by health care provider: Secondary | ICD-10-CM | POA: Insufficient documentation

## 2018-03-07 DIAGNOSIS — I83892 Varicose veins of left lower extremities with other complications: Secondary | ICD-10-CM | POA: Diagnosis not present

## 2018-03-07 DIAGNOSIS — I10 Essential (primary) hypertension: Secondary | ICD-10-CM | POA: Diagnosis not present

## 2018-03-07 NOTE — ED Triage Notes (Signed)
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 Pt reports once last Tuesday as well as tonight. Pt denies any pain at this time. Pt bleeding is controlled

## 2018-03-07 NOTE — ED Triage Notes (Signed)
Pt arrives from home got in the shower and saw blood, varicose vein the left left lower leg, EMS estimated 200 cc blood loss. Pressure dressing in place, +pulses. Same thing occurred in the same area prior, most recently last week.

## 2018-03-08 ENCOUNTER — Emergency Department (HOSPITAL_COMMUNITY)
Admission: EM | Admit: 2018-03-08 | Discharge: 2018-03-08 | Disposition: A | Discharge status: Home / Self Care | Payer: Medicare Other | Attending: Emergency Medicine | Admitting: Emergency Medicine

## 2018-03-08 DIAGNOSIS — I83028 Varicose veins of left lower extremity with ulcer other part of lower leg: Secondary | ICD-10-CM | POA: Diagnosis not present

## 2018-03-08 DIAGNOSIS — I872 Venous insufficiency (chronic) (peripheral): Secondary | ICD-10-CM | POA: Diagnosis not present

## 2018-03-08 NOTE — ED Notes (Signed)
Follow up call made  Pt followed up w pcp today  03/08/18 1525   s Jazmene Racz rn

## 2018-03-11 IMAGING — US US ABDOMEN LIMITED
1 series · 14 of 25 positions shown · non-contrast
Comparison: Abdominal ultrasound May 02, 2008

CLINICAL DATA: History of diabetes, hypertension, fatty
infiltration of the liver, left-sided multilocular cystic nephroma.

EXAM:
US ABDOMEN LIMITED - RIGHT UPPER QUADRANT

[Series 1: us abdomen limited · 0.25mm/px · 14 of 79 slices shown]
[im 1/79]
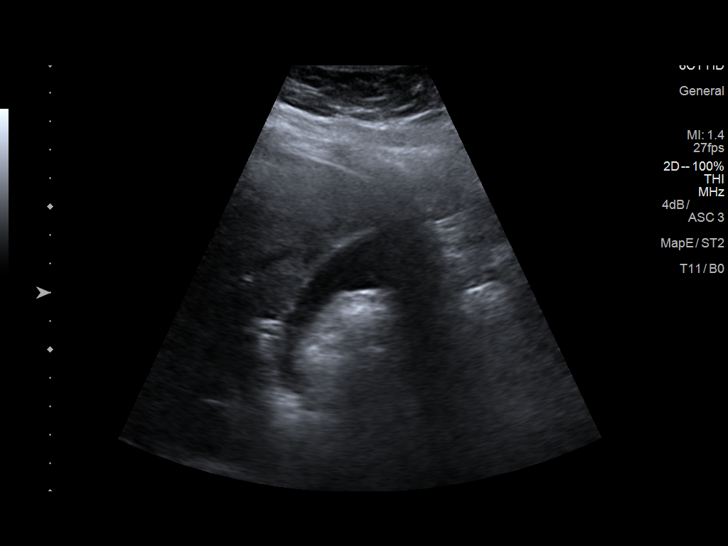
[im 7/79]
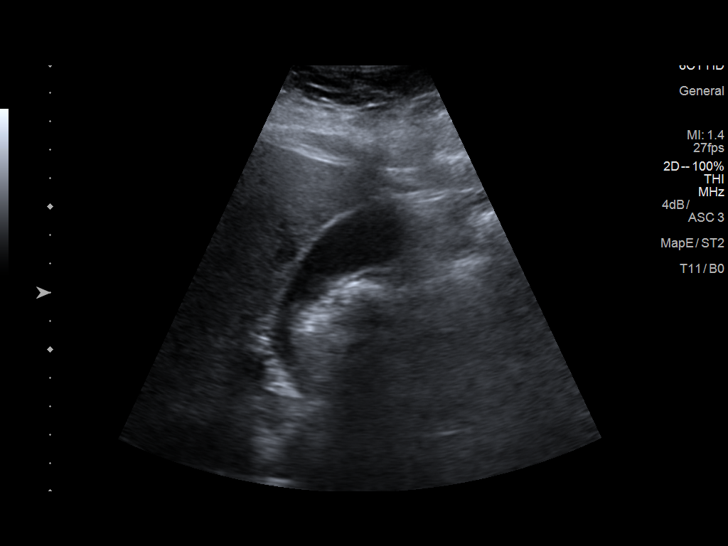
[im 14/79]
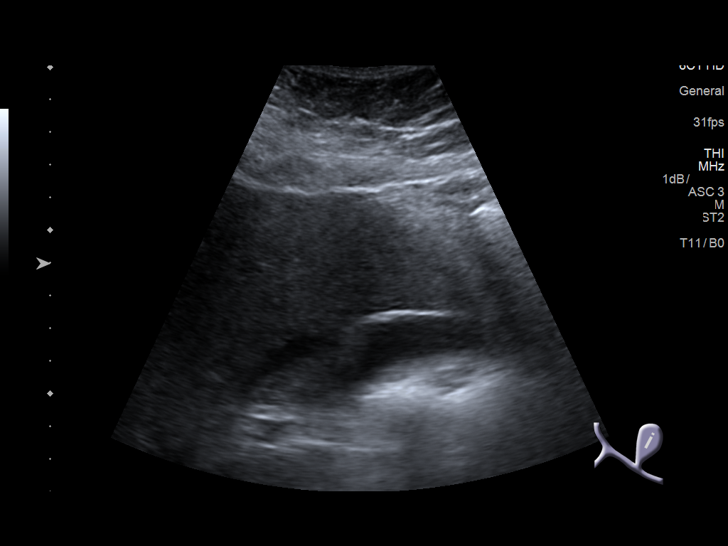
[im 20/79]
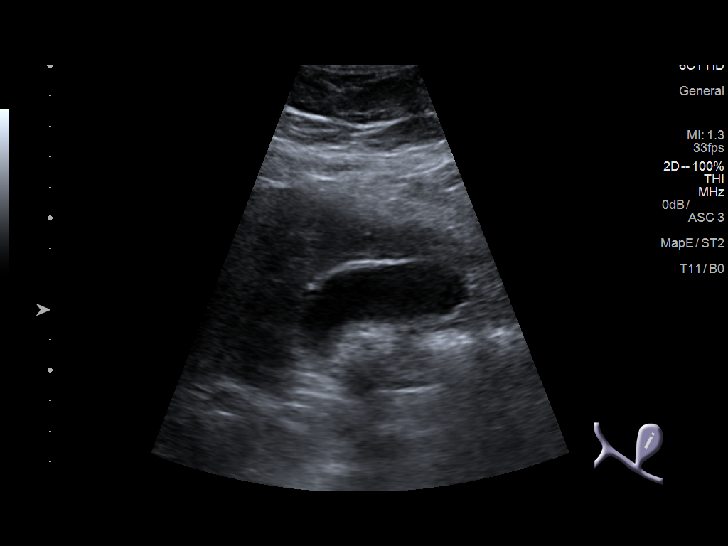
[im 27/79]
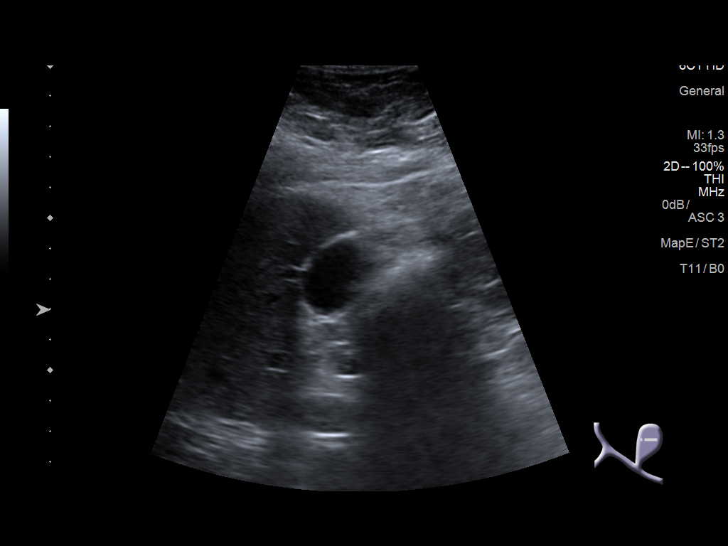
[im 30/79]
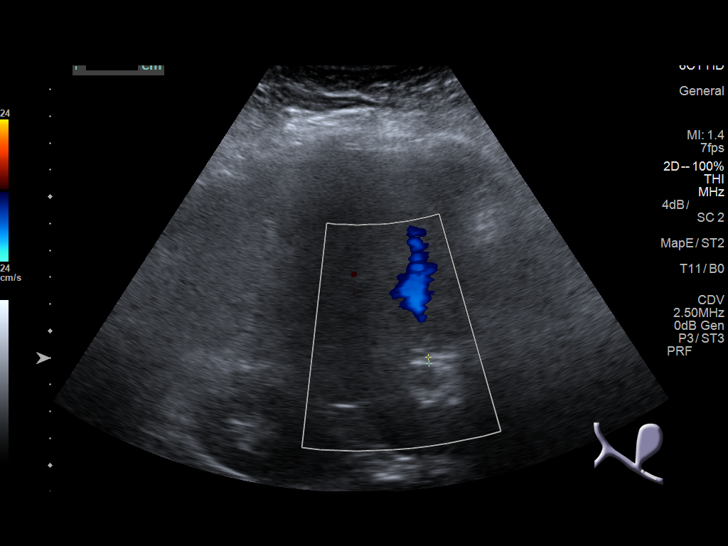
[im 36/79]
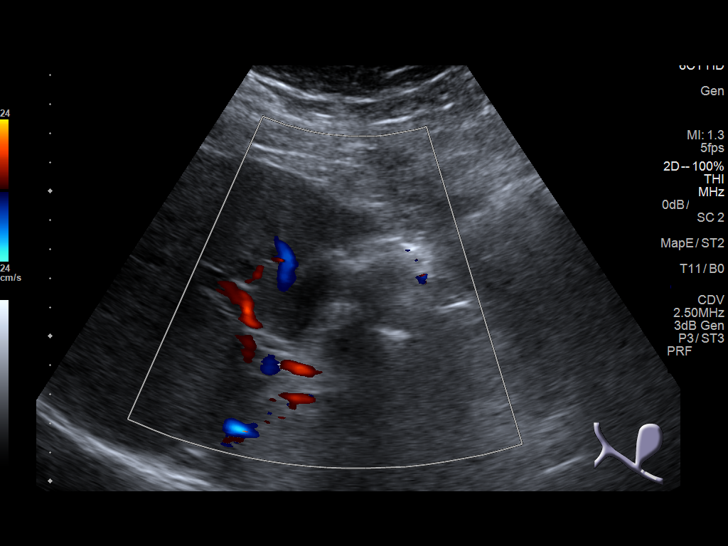
[im 43/79]
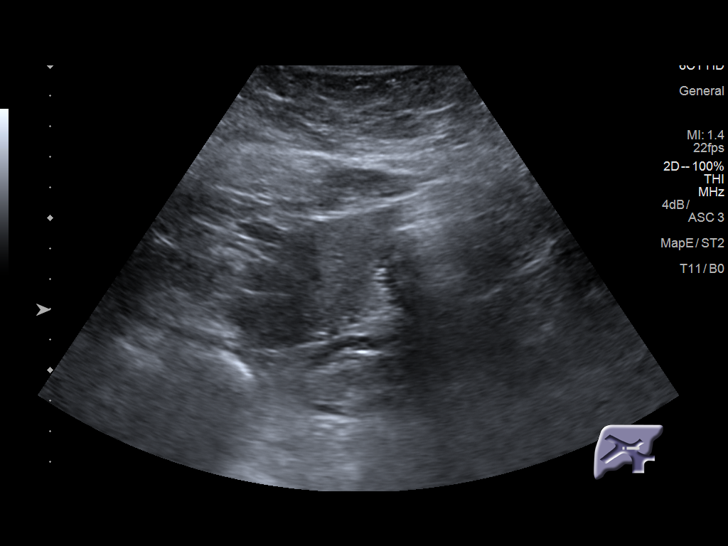
[im 49/79]
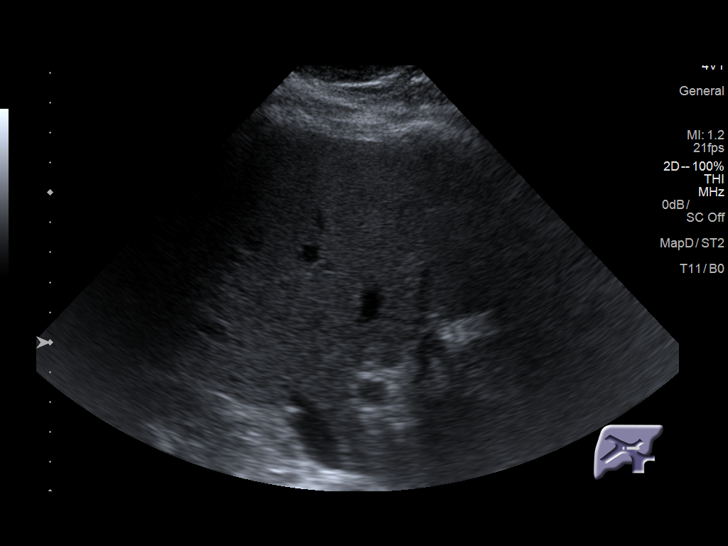
[im 53/79]
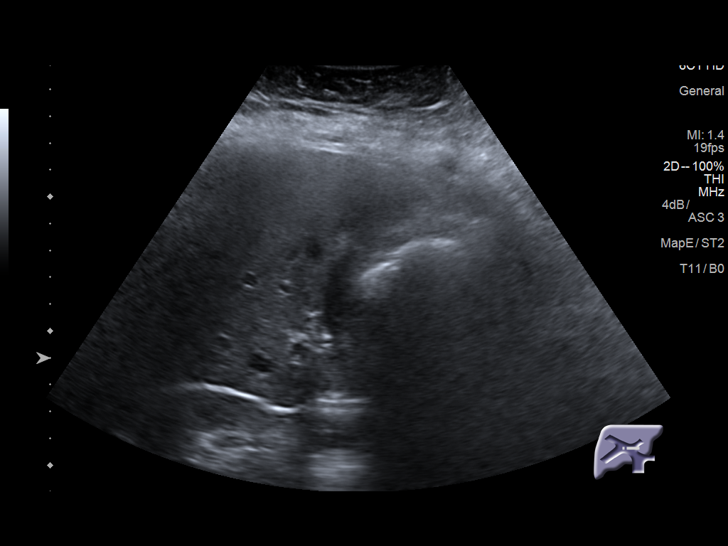
[im 59/79]
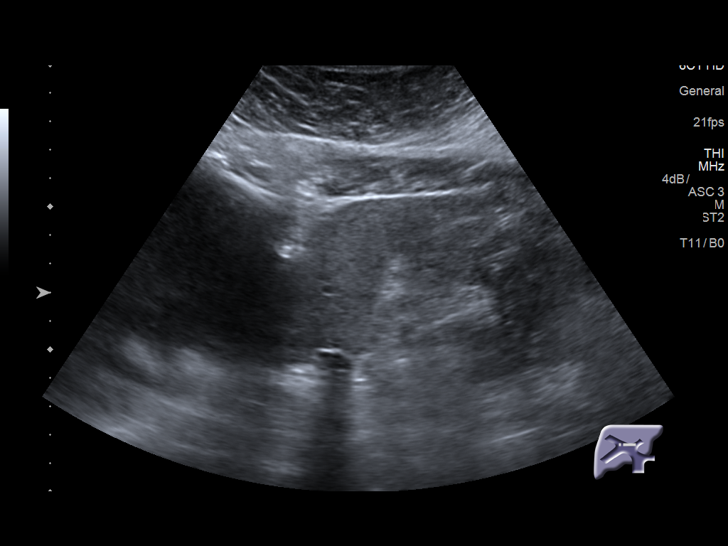
[im 66/79]
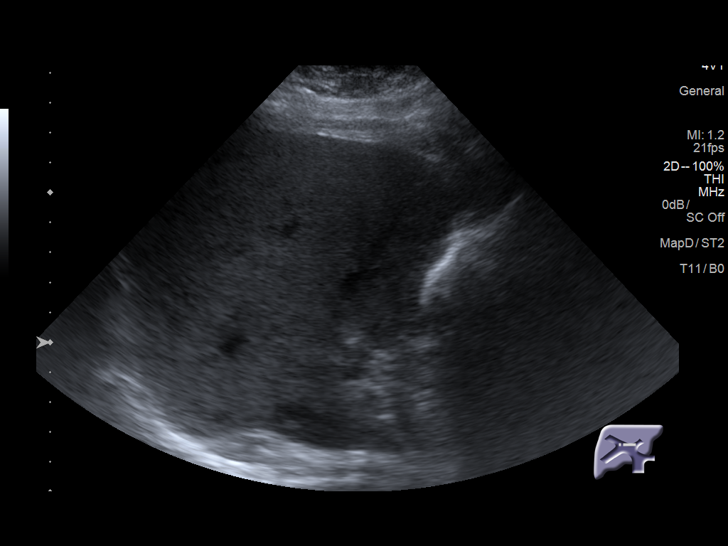
[im 72/79]
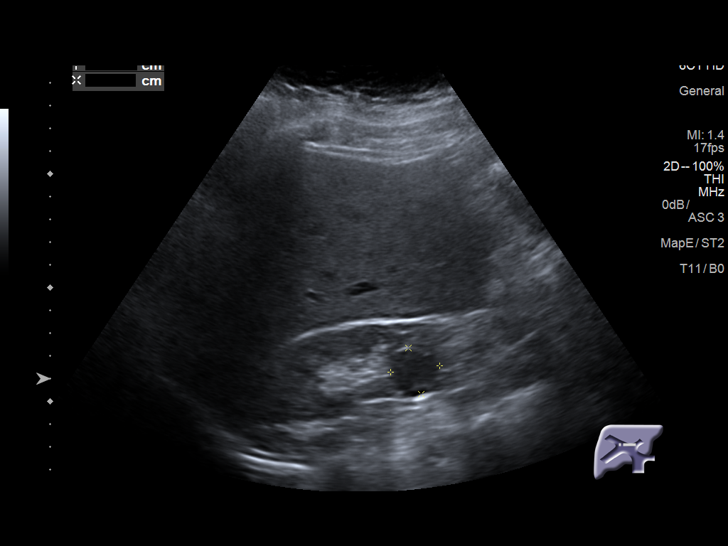
[im 79/79]
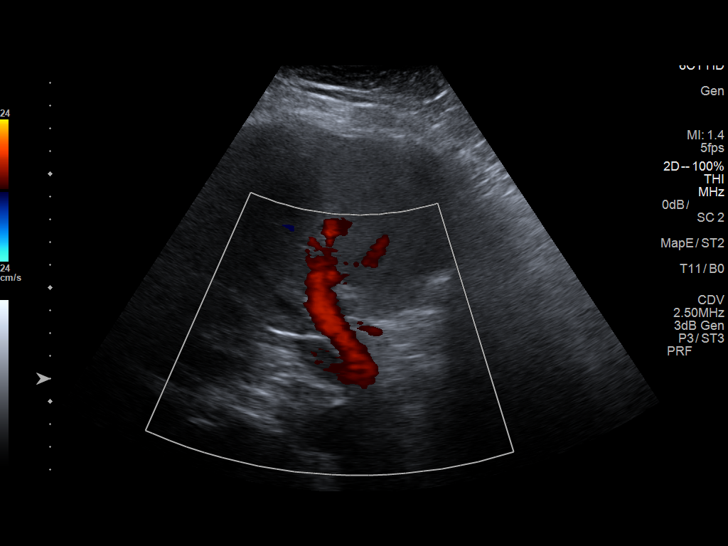

[14 of 25 positions shown; findings below may reference images not displayed]

FINDINGS: Gallbladder:

No gallstones or wall thickening visualized. No sonographic Murphy
sign noted by sonographer.

Common bile duct:

Diameter: 4.4 mm

Liver:

The hepatic echotexture is heterogeneously increased. There is no
focal mass or ductal dilation. The surface contour of the liver is
normal.

Incidental note is made of a hypoechoic to anechoic structure with
enhanced through transmission in the lower pole of the right kidney
measuring 2.2 x 2.1 x 1.7 cm. This is smaller than on previous
studies.
IMPRESSION: Fatty infiltrative change of the liver. No focal mass. Normal
appearance of the gallbladder and common bile duct.

## 2018-03-30 DIAGNOSIS — I8310 Varicose veins of unspecified lower extremity with inflammation: Secondary | ICD-10-CM | POA: Diagnosis not present

## 2018-06-11 DIAGNOSIS — J439 Emphysema, unspecified: Secondary | ICD-10-CM | POA: Diagnosis not present

## 2018-06-11 DIAGNOSIS — R252 Cramp and spasm: Secondary | ICD-10-CM | POA: Diagnosis not present

## 2018-06-11 DIAGNOSIS — E782 Mixed hyperlipidemia: Secondary | ICD-10-CM | POA: Diagnosis not present

## 2018-06-11 DIAGNOSIS — E118 Type 2 diabetes mellitus with unspecified complications: Secondary | ICD-10-CM | POA: Diagnosis not present

## 2018-06-23 ENCOUNTER — Other Ambulatory Visit: Payer: Self-pay | Admitting: Internal Medicine

## 2018-06-23 DIAGNOSIS — J209 Acute bronchitis, unspecified: Secondary | ICD-10-CM

## 2018-06-23 DIAGNOSIS — J440 Chronic obstructive pulmonary disease with acute lower respiratory infection: Principal | ICD-10-CM

## 2018-06-23 MED ORDER — ALBUTEROL SULFATE (2.5 MG/3ML) 0.083% IN NEBU: 2.5000 mg | INHALATION_SOLUTION | Freq: Three times a day (TID) | RESPIRATORY_TRACT | 12 refills | Status: DC | PRN

## 2018-07-01 ENCOUNTER — Ambulatory Visit: Payer: Medicare Other | Admitting: Internal Medicine

## 2018-08-09 DIAGNOSIS — J449 Chronic obstructive pulmonary disease, unspecified: Secondary | ICD-10-CM | POA: Diagnosis not present

## 2018-08-09 DIAGNOSIS — E7210 Disorders of sulfur-bearing amino-acid metabolism, unspecified: Secondary | ICD-10-CM | POA: Diagnosis not present

## 2018-08-09 DIAGNOSIS — E118 Type 2 diabetes mellitus with unspecified complications: Secondary | ICD-10-CM | POA: Diagnosis not present

## 2018-08-09 DIAGNOSIS — K429 Umbilical hernia without obstruction or gangrene: Secondary | ICD-10-CM | POA: Diagnosis not present

## 2018-08-09 DIAGNOSIS — Z6838 Body mass index (BMI) 38.0-38.9, adult: Secondary | ICD-10-CM | POA: Diagnosis not present

## 2018-08-15 DIAGNOSIS — N390 Urinary tract infection, site not specified: Secondary | ICD-10-CM | POA: Diagnosis not present

## 2018-08-23 DIAGNOSIS — N390 Urinary tract infection, site not specified: Secondary | ICD-10-CM | POA: Diagnosis not present

## 2018-08-23 DIAGNOSIS — N898 Other specified noninflammatory disorders of vagina: Secondary | ICD-10-CM | POA: Diagnosis not present

## 2018-08-23 DIAGNOSIS — R8290 Unspecified abnormal findings in urine: Secondary | ICD-10-CM | POA: Diagnosis not present

## 2018-08-23 DIAGNOSIS — M545 Low back pain: Secondary | ICD-10-CM | POA: Diagnosis not present

## 2018-08-23 DIAGNOSIS — R350 Frequency of micturition: Secondary | ICD-10-CM | POA: Diagnosis not present

## 2018-09-02 DIAGNOSIS — M8580 Other specified disorders of bone density and structure, unspecified site: Secondary | ICD-10-CM | POA: Diagnosis not present

## 2018-11-21 DIAGNOSIS — D3131 Benign neoplasm of right choroid: Secondary | ICD-10-CM | POA: Diagnosis not present

## 2018-11-21 DIAGNOSIS — D3132 Benign neoplasm of left choroid: Secondary | ICD-10-CM | POA: Diagnosis not present

## 2018-11-21 DIAGNOSIS — H43812 Vitreous degeneration, left eye: Secondary | ICD-10-CM | POA: Diagnosis not present

## 2018-11-21 DIAGNOSIS — H25813 Combined forms of age-related cataract, bilateral: Secondary | ICD-10-CM | POA: Diagnosis not present

## 2018-11-28 DIAGNOSIS — D3131 Benign neoplasm of right choroid: Secondary | ICD-10-CM | POA: Insufficient documentation

## 2018-11-28 DIAGNOSIS — D3132 Benign neoplasm of left choroid: Secondary | ICD-10-CM | POA: Insufficient documentation

## 2018-11-28 DIAGNOSIS — H43812 Vitreous degeneration, left eye: Secondary | ICD-10-CM | POA: Insufficient documentation

## 2018-12-01 DIAGNOSIS — D3132 Benign neoplasm of left choroid: Secondary | ICD-10-CM | POA: Diagnosis not present

## 2018-12-01 DIAGNOSIS — E119 Type 2 diabetes mellitus without complications: Secondary | ICD-10-CM | POA: Diagnosis not present

## 2018-12-01 DIAGNOSIS — D3131 Benign neoplasm of right choroid: Secondary | ICD-10-CM | POA: Diagnosis not present

## 2018-12-01 DIAGNOSIS — H353131 Nonexudative age-related macular degeneration, bilateral, early dry stage: Secondary | ICD-10-CM | POA: Diagnosis not present

## 2018-12-01 DIAGNOSIS — H25813 Combined forms of age-related cataract, bilateral: Secondary | ICD-10-CM | POA: Insufficient documentation

## 2018-12-01 DIAGNOSIS — H43812 Vitreous degeneration, left eye: Secondary | ICD-10-CM | POA: Diagnosis not present

## 2018-12-09 DIAGNOSIS — D3131 Benign neoplasm of right choroid: Secondary | ICD-10-CM | POA: Diagnosis not present

## 2018-12-09 DIAGNOSIS — H1851 Endothelial corneal dystrophy: Secondary | ICD-10-CM | POA: Diagnosis not present

## 2018-12-09 DIAGNOSIS — D3132 Benign neoplasm of left choroid: Secondary | ICD-10-CM | POA: Diagnosis not present

## 2018-12-09 DIAGNOSIS — H43812 Vitreous degeneration, left eye: Secondary | ICD-10-CM | POA: Diagnosis not present

## 2018-12-22 DIAGNOSIS — E119 Type 2 diabetes mellitus without complications: Secondary | ICD-10-CM | POA: Diagnosis not present

## 2018-12-22 DIAGNOSIS — E162 Hypoglycemia, unspecified: Secondary | ICD-10-CM | POA: Diagnosis not present

## 2018-12-22 DIAGNOSIS — Z9884 Bariatric surgery status: Secondary | ICD-10-CM | POA: Diagnosis not present

## 2018-12-29 ENCOUNTER — Other Ambulatory Visit: Payer: Self-pay | Admitting: *Deleted

## 2018-12-29 DIAGNOSIS — Z20822 Contact with and (suspected) exposure to covid-19: Secondary | ICD-10-CM

## 2019-01-01 LAB — NOVEL CORONAVIRUS, NAA: SARS-CoV-2, NAA: NOT DETECTED

## 2019-01-03 DIAGNOSIS — E119 Type 2 diabetes mellitus without complications: Secondary | ICD-10-CM | POA: Diagnosis not present

## 2019-01-03 DIAGNOSIS — Z9884 Bariatric surgery status: Secondary | ICD-10-CM | POA: Diagnosis not present

## 2019-01-03 DIAGNOSIS — E162 Hypoglycemia, unspecified: Secondary | ICD-10-CM | POA: Diagnosis not present

## 2019-01-23 DIAGNOSIS — J449 Chronic obstructive pulmonary disease, unspecified: Secondary | ICD-10-CM | POA: Diagnosis not present

## 2019-02-02 ENCOUNTER — Other Ambulatory Visit: Payer: Self-pay | Admitting: *Deleted

## 2019-02-02 DIAGNOSIS — Z20822 Contact with and (suspected) exposure to covid-19: Secondary | ICD-10-CM

## 2019-02-07 LAB — NOVEL CORONAVIRUS, NAA: SARS-CoV-2, NAA: NOT DETECTED

## 2019-02-11 DIAGNOSIS — E1169 Type 2 diabetes mellitus with other specified complication: Secondary | ICD-10-CM | POA: Diagnosis not present

## 2019-02-11 DIAGNOSIS — J449 Chronic obstructive pulmonary disease, unspecified: Secondary | ICD-10-CM | POA: Diagnosis not present

## 2019-02-11 DIAGNOSIS — L299 Pruritus, unspecified: Secondary | ICD-10-CM | POA: Diagnosis not present

## 2019-02-17 ENCOUNTER — Other Ambulatory Visit: Payer: Self-pay | Admitting: Internal Medicine

## 2019-02-17 NOTE — Telephone Encounter (Signed)
Dr. Annamaria Boots, please advise if it is okay for [place] to refill pt's Bevespi and when pt is needing to come back to office for a follow up as she is currently not scheduled for a visit. Thanks!  No Known Allergies   Current Outpatient Medications:  .  acetaminophen (TYLENOL) 500 MG tablet, Take 1,000 mg by mouth every 6 (six) hours as needed for mild pain or moderate pain., Disp: , Rfl:  .  albuterol (PROVENTIL) (2.5 MG/3ML) 0.083% nebulizer solution, Take 3 mLs (2.5 mg total) by nebulization every 8 (eight) hours as needed for wheezing or shortness of breath., Disp: 360 mL, Rfl: 12 .  Albuterol Sulfate (PROAIR RESPICLICK) 915 (90 Base) MCG/ACT AEPB, Inhale 2 puffs into the lungs every 4 (four) hours as needed (SOB, wheezing)., Disp: 1 each, Rfl: 12 .  ALPRAZolam (XANAX) 0.25 MG tablet, TAKE 1 TABLET BY MOUTH EVERY DAY, Disp: 30 tablet, Rfl: 0 .  BEVESPI AEROSPHERE 9-4.8 MCG/ACT AERO, INHALE 2 PUFFS INTO THE LUNGS TWICE A DAY, Disp: 10.7 Inhaler, Rfl: 12 .  Biotin 5000 MCG TABS, Take 10,000 mcg by mouth daily., Disp: , Rfl:  .  cetirizine (ZYRTEC) 10 MG tablet, Take 10 mg by mouth daily., Disp: , Rfl:  .  cholecalciferol (VITAMIN D) 1000 units tablet, Take 1,000 Units by mouth daily., Disp: , Rfl:  .  diphenhydrAMINE (BENADRYL) 25 mg capsule, Take 25 mg by mouth daily as needed for itching, allergies or sleep. For itching , Disp: , Rfl:  .  HYDROcodone-Chlorpheniramine 5-4 MG/5ML SOLN, Take 5 mLs by mouth every 12 (twelve) hours as needed (cough)., Disp: 200 mL, Rfl: 0 .  Multiple Vitamin (MULTIVITAMIN) tablet, Take 1 tablet by mouth daily., Disp: , Rfl:  .  Nebulizers (COMPRESSOR/NEBULIZER) MISC, Use as directed, Disp: 1 each, Rfl: prn

## 2019-02-19 NOTE — Telephone Encounter (Signed)
Bevespi refill e-sent. Please help her make a f/u ov sometime in next 4 months- routine

## 2019-02-24 ENCOUNTER — Other Ambulatory Visit: Payer: Self-pay

## 2019-02-24 DIAGNOSIS — Z20822 Contact with and (suspected) exposure to covid-19: Secondary | ICD-10-CM

## 2019-02-25 LAB — NOVEL CORONAVIRUS, NAA: SARS-CoV-2, NAA: NOT DETECTED

## 2019-02-27 NOTE — Progress Notes (Addendum)
@Patient  ID: [name], female    DOB: [birth year], 73 y.o.   MRN: [id number]  Chief Complaint  Patient presents with  . 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  Quit 2003.  74-pack-year smoking history. Maintenance: Bevespi Pt of: Dr. Annamaria Boots  02/28/2019  - Visit   73 year old female former smoker followed in our office for COPD.  Patient made office visit today as a follow-up to to worsening shortness of breath.  Patient reports that she was recently tested for SARS-CoV-2 and found to be negative.  She was tested due to the fact that she works at [employer] and there is a homeless shelter there.  She reports she is tested basically monthly.  She is maintained on Bevespi but she does not feel that this works well.  Patient reports she is having to use her albuterol nebulizer twice daily.  Patient feels that she has been getting more short of breath lately.  Patient also reports that recently she has been having episodes of hypoglycemia sometimes dropping to 36.  Patient was referred to endocrinology from primary care she reports she had 2 office visits with them and they informed her there is nothing they could do.  Patient is planning on seeing endocrinologist at [facility] soon.    Tests:   PFT 07/05/2008-moderate obstructive airways disease with response to bronchodilator, diffusion moderately reduced. FEV1 1.75/87%, FEV1/FVC 0.53, DLCO 49  Office Spirometry 07/04/2015-mild airway obstruction. FEV1/FVC 0.55  Office Spirometry 03/06/2016- mild obstructive airways disease-FVC 2.01/104%, FEV1 1.16/78%, ratio 0.58, FEF 25-75% 0.61/ 43%  NPSG 07/03/13- AHI 5.3/ hr, weight 234 pounds  PFT 05/03/17-mild  obstructive airways disease, no response to bronchodilator.  Diffusion moderately reduced.  FEV1/FVC 0.59, DLCO 59%  SIX MIN WALK 02/28/2019  Supplimental Oxygen during Test? (L/min) Yes  O2 Flow Rate 3  Type Continuous  Tech Comments: moderate walking pace - completed about 2/3 of walk and O2 sats dropped to 84%. Oxygen by [place] titrated to 2-3L to maintain sats above 88%.  Patient c/o SOB at the time but recovered quickly with addition of oxygen.  Completed walk with 3L O2 and no further desaturation.  Joella Prince RN      FENO:  No results found for: NITRICOXIDE  PFT: PFT Results Latest Ref Rng & Units 05/03/2017  FVC-Pre L 2.70  FVC-Predicted Pre % 144  FVC-Post L 2.70  FVC-Predicted Post % 144  Pre FEV1/FVC % % 56  Post FEV1/FCV % % 59  FEV1-Pre L 1.52  FEV1-Predicted Pre % 105  FEV1-Post L 1.58  DLCO UNC% % 59  DLCO COR %Predicted % 60  TLC L 4.64  TLC % Predicted % 104  RV % Predicted % 85    Imaging: No results found.    Specialty Problems      Pulmonary Problems   COPD mixed type ([place])    PFT 07/05/2008-moderate obstructive airways disease with response to bronchodilator, diffusion moderately reduced. FEV1 1.75/87%, FEV1/FVC 0.53, DLCO 49% Office Spirometry 07/04/2015-mild airway obstruction. FEV1/FVC 0.55 Office Spirometry 03/06/2016- mild obstructive airways disease-FVC 2.01/104%, FEV1 1.16/78%, ratio 0.58, FEF 25-75% 0.61/ 43% PFT 05/03/17-mild obstructive airways disease, no response to bronchodilator.  Diffusion moderately reduced.  FEV1/FVC 0.59, DLCO 59%  DYSPNEA    Qualifier: Diagnosis of  By: Langston Masker CNA, Nunzio Cory        COPD with acute bronchitis ([place])   Obstructive sleep apnea    NPSG 07/03/13- AHI 5.3/ hr, weight 234 pounds Trivial obstructive apnea, barely over the upper limit of normal. Plan-conservative management recommending weight loss, sleep off flat of back.      Cough      No Known Allergies  Immunization History   Administered Date(s) Administered  . DT 05/30/2012  . H1N1 06/26/2008  . Influenza Split 05/11/2011, 05/24/2012, 04/19/2013, 05/15/2014  . Influenza Whole 05/03/2009  . Influenza, High Dose Seasonal PF 05/03/2017, 04/29/2018  . Influenza, Seasonal, Injecte, Preservative Fre 05/15/2014  . Influenza-Unspecified 05/21/2015  . Pneumococcal Conjugate-13 07/04/2015  . Pneumococcal Polysaccharide-23 01/03/2004  . Varicella 06/29/2012, 08/04/2012    Past Medical History:  Diagnosis Date  . Abdominal pain    around naval  . Arthritis   . Breast pain in female   . Bruises easily   . Cancer ([place]) 2003   lung  . Cervical disc disease   . Chronic depression   . COPD (chronic obstructive pulmonary disease) ([place])   . Diabetes mellitus   . DJD (degenerative joint disease)   . Gum disease   . Hernia   . Insomnia   . Phlebitis   . Thyroid disease   . Wears glasses   . Weight increase     Tobacco History: Social History   Tobacco Use  Smoking Status Former Smoker  . Packs/day: 2.00  . Years: 37.00  . Pack years: 74.00  . Types: Cigarettes  . Quit date: 06/21/2002  . Years since quitting: 16.7  Smokeless Tobacco Never Used   Counseling given: Yes   Continue to not smoke  Outpatient Encounter Medications as of 02/28/2019  Medication Sig  . acetaminophen (TYLENOL) 500 MG tablet Take 1,000 mg by mouth every 6 (six) hours as needed for mild pain or moderate pain.  Marland Kitchen albuterol (PROVENTIL) (2.5 MG/3ML) 0.083% nebulizer solution Take 3 mLs (2.5 mg total) by nebulization every 8 (eight) hours as needed for wheezing or shortness of breath.  . Albuterol Sulfate (PROAIR RESPICLICK) 295 (90 Base) MCG/ACT AEPB Inhale 2 puffs into the lungs every 4 (four) hours as needed (SOB, wheezing).  . ALPRAZolam (XANAX) 0.25 MG tablet TAKE 1 TABLET BY MOUTH EVERY DAY  . BEVESPI AEROSPHERE 9-4.8 MCG/ACT AERO INHALE 2 PUFFS INTO THE LUNGS TWICE A DAY  . Biotin 5000 MCG TABS Take 10,000 mcg by mouth  daily.  . cetirizine (ZYRTEC) 10 MG tablet Take 10 mg by mouth daily.  . diphenhydrAMINE (BENADRYL) 25 mg capsule Take 25 mg by mouth daily as needed for itching, allergies or sleep. For itching   . HYDROcodone-Chlorpheniramine 5-4 MG/5ML SOLN Take 5 mLs by mouth every 12 (twelve) hours as needed (cough).  . Multiple Vitamin (MULTIVITAMIN) tablet Take 1 tablet by mouth daily.  . Nebulizers (COMPRESSOR/NEBULIZER) MISC Use as directed  . cholecalciferol (VITAMIN D) 1000 units tablet Take 1,000 Units by mouth daily.  . Tiotropium Bromide-Olodaterol (STIOLTO RESPIMAT) 2.5-2.5 MCG/ACT AERS Inhale 2 puffs into the lungs daily.   No facility-administered encounter medications on file as of 02/28/2019.      Review of Systems  Review of Systems  Constitutional: Positive for fatigue. Negative for activity change and fever.  HENT: Positive for congestion, sinus pressure and sinus pain. Negative for sore throat.   Respiratory: Positive for cough, shortness of breath and wheezing.  Cardiovascular: Negative for chest pain and palpitations.  Gastrointestinal: Negative for diarrhea, nausea and vomiting.  Musculoskeletal: Negative for arthralgias.  Neurological: Positive for headaches. Negative for dizziness.  Psychiatric/Behavioral: Negative for sleep disturbance. The patient is not nervous/anxious.      Physical Exam  BP 122/70 (BP Location: Right Arm, Patient Position: Sitting, Cuff Size: Normal)   Pulse 69   Temp 97.8 F (36.6 C)   Ht 5' (1.524 m)   Wt 219 lb 3.2 oz (99.4 kg)   SpO2 97%   BMI 42.81 kg/m   Wt Readings from Last 5 Encounters:  02/28/19 219 lb 3.2 oz (99.4 kg)  03/07/18 202 lb (91.6 kg)  12/30/17 203 lb (92.1 kg)  05/03/17 203 lb (92.1 kg)  10/26/16 216 lb 9.6 oz (98.2 kg)     Physical Exam Vitals signs and nursing note reviewed.  Constitutional:      General: She is not in acute distress.    Appearance: Normal appearance. She is obese.  HENT:     Head:  Normocephalic and atraumatic.     Right Ear: Tympanic membrane, ear canal and external ear normal. There is no impacted cerumen.     Left Ear: Tympanic membrane, ear canal and external ear normal. There is no impacted cerumen.     Nose: Nose normal. No congestion.     Mouth/Throat:     Mouth: Mucous membranes are moist.     Pharynx: Oropharynx is clear.  Eyes:     Pupils: Pupils are equal, round, and reactive to light.  Neck:     Musculoskeletal: Normal range of motion.  Cardiovascular:     Rate and Rhythm: Normal rate and regular rhythm.     Pulses: Normal pulses.     Heart sounds: Normal heart sounds. No murmur.  Pulmonary:     Effort: Pulmonary effort is normal. No respiratory distress.     Breath sounds: No decreased air movement. Decreased breath sounds (diminished breath sounds throughout exam) present. No wheezing or rales.  Skin:    General: Skin is warm and dry.     Capillary Refill: Capillary refill takes less than 2 seconds.  Neurological:     General: No focal deficit present.     Mental Status: She is alert and oriented to person, place, and time. Mental status is at baseline.     Gait: Gait (tolerated walk in office) normal.  Psychiatric:        Mood and Affect: Mood normal.        Behavior: Behavior normal.        Thought Content: Thought content normal.        Judgment: Judgment normal.      Lab Results:  CBC    Component Value Date/Time   WBC 5.6 09/23/2016 1950   RBC 5.49 (H) 09/23/2016 1950   HGB 14.6 09/23/2016 1950   HGB 15.0 01/31/2007 1028   HCT 45.0 09/23/2016 1950   HCT 44.6 01/31/2007 1028   PLT 153 09/23/2016 1950   PLT 214 01/31/2007 1028   MCV 82.0 09/23/2016 1950   MCV 80.1 (L) 01/31/2007 1028   MCH 26.6 09/23/2016 1950   MCHC 32.4 09/23/2016 1950   RDW 15.8 (H) 09/23/2016 1950   RDW 15.7 (H) 01/31/2007 1028   LYMPHSABS 0.9 09/23/2016 1950   LYMPHSABS 1.0 01/31/2007 1028   MONOABS 0.6 09/23/2016 1950   MONOABS 0.5 01/31/2007 1028    EOSABS 0.1 09/23/2016 1950   EOSABS 0.2 01/31/2007 1028  BASOSABS 0.0 09/23/2016 1950   BASOSABS 0.0 01/31/2007 1028    BMET    Component Value Date/Time   NA 139 09/23/2016 1950   K 4.2 09/23/2016 1950   CL 106 09/23/2016 1950   CO2 27 09/23/2016 1950   GLUCOSE 95 09/23/2016 1950   BUN 14 09/23/2016 1950   CREATININE 0.94 09/23/2016 1950   CALCIUM 8.9 09/23/2016 1950   GFRNONAA 60 (L) 09/23/2016 1950   GFRAA >60 09/23/2016 1950    BNP No results found for: BNP  ProBNP No results found for: PROBNP    Assessment & Plan:   COPD mixed type ([place]) Plan: Stop Bevespi Trial of Stiolto Respimat Can continue to use albuterol nebulized meds every 6 hours as needed for shortness of breath or wheezing Could consider pulmonary function testing again over the next 6 months or so Increase daily physical activity Start oxygen with physical activity Follow-up with our office in 6 weeks, also set up an appointment for Dr. Janee Morn next available  Obstructive sleep apnea Plan: Continue to work on increasing physical activity to reduce body weight Start 2 L of O2 at night when established with oxygen  DYSPNEA Plan: Chest x-ray today Labwork today  Walk today in office revealed patient requires 3 L of O2 with physical exertion Start 3 L of O2 with physical exertion Follow-up with our office in 6 weeks as well as Dr. Janee Morn next available  Medication management Worked with clinical pharmacy team to run test claims from the patient.  It seems that [facility] would be the most financially affordable for the patient as there is a e voucher 9.99 a month based off patient's insurance.  Plan: We will do trial of Stiolto Respimat    Return in about 6 weeks (around 04/11/2019), or if symptoms worsen or fail to improve, for Follow up with Dr. Annamaria Boots, or next available with [facility] .   Lauraine Rinne, NP 02/28/2019   This appointment was 28 minutes long with over 50% of the time in direct  face-to-face patient care, assessment, plan of care, and follow-up.

## 2019-02-28 ENCOUNTER — Encounter: Payer: Self-pay | Admitting: Pulmonary Disease

## 2019-02-28 ENCOUNTER — Telehealth: Payer: Self-pay | Admitting: Pulmonary Disease

## 2019-02-28 ENCOUNTER — Ambulatory Visit (INDEPENDENT_AMBULATORY_CARE_PROVIDER_SITE_OTHER): Payer: Medicare Other

## 2019-02-28 ENCOUNTER — Ambulatory Visit (INDEPENDENT_AMBULATORY_CARE_PROVIDER_SITE_OTHER): Payer: Medicare Other | Admitting: Pulmonary Disease

## 2019-02-28 ENCOUNTER — Other Ambulatory Visit: Payer: Self-pay

## 2019-02-28 VITALS — BP 122/70 | HR 69 | Temp 97.8°F | Ht 60.0 in | Wt 219.2 lb

## 2019-02-28 DIAGNOSIS — R0602 Shortness of breath: Secondary | ICD-10-CM | POA: Diagnosis not present

## 2019-02-28 DIAGNOSIS — J449 Chronic obstructive pulmonary disease, unspecified: Secondary | ICD-10-CM

## 2019-02-28 DIAGNOSIS — G4733 Obstructive sleep apnea (adult) (pediatric): Secondary | ICD-10-CM

## 2019-02-28 DIAGNOSIS — Z79899 Other long term (current) drug therapy: Secondary | ICD-10-CM | POA: Diagnosis not present

## 2019-02-28 MED ORDER — STIOLTO RESPIMAT 2.5-2.5 MCG/ACT IN AERS: 2.0000 | INHALATION_SPRAY | Freq: Every day | RESPIRATORY_TRACT | 0 refills | Status: DC

## 2019-02-28 NOTE — Assessment & Plan Note (Signed)
Worked with clinical pharmacy team to run test claims from the patient.  It seems that [place] would be the most financially affordable for the patient as there is a e voucher 9.99 a month based off patient's insurance.  Plan: We will do trial of Stiolto Respimat

## 2019-02-28 NOTE — Addendum Note (Signed)
Addended by: Lauraine Rinne on: 02/28/2019 02:43 PM   Modules accepted: Orders

## 2019-02-28 NOTE — Patient Instructions (Addendum)
Walk today   Chest Xray today   Okay to stop Bevespi   Trial of Stiolto Respimat inhaler >>>2 puffs daily >>>Take this no matter what >>>This is not a rescue inhaler  Please call us in 1 week and let us know how you are doing on Stiolto, if you are tolerating this inhaler and like it then we can send in a prescription  Continue oxygen therapy as prescribed  - 3L of Oxygen with exertion  >>>maintain oxygen saturations greater than 88 percent  >>>if unable to maintain oxygen saturations please contact the office  >>>do not smoke with oxygen  >>>can use nasal saline gel or nasal saline rinses to moisturize nose if oxygen causes dryness  Please purchase a pulse oximeter in order to monitor your oxygen saturations at home   Return in about 6 weeks (around 04/11/2019), or if symptoms worsen or fail to improve, for Follow up with Dr. Annamaria Boots, or next available with CY .   Coronavirus (COVID-19) Are you at risk?  Are you at risk for the Coronavirus (COVID-19)?  To be considered HIGH RISK for Coronavirus (COVID-19), you have to meet the following criteria:  . Traveled to [place], [place], [place], [place] or [place]; or in the [place] to [place], [employer], [place], or [place]; and have fever, cough, and shortness of breath within the last 2 weeks of travel OR . Been in close contact with a person diagnosed with COVID-19 within the last 2 weeks and have fever, cough, and shortness of breath . IF YOU DO NOT MEET THESE CRITERIA, YOU ARE CONSIDERED LOW RISK FOR COVID-19.  What to do if you are HIGH RISK for COVID-19?  Marland Kitchen If you are having a medical emergency, call 911. . Seek medical care right away. Before you go to a doctor's office, urgent care or emergency department, call ahead and tell them about your recent travel, contact with someone diagnosed with COVID-19, and your symptoms. You should receive instructions from your physician's office regarding next steps of care.  . When  you arrive at healthcare provider, tell the healthcare staff immediately you have returned from visiting [place], [place], [place], [place] or [place]; or traveled in the [place] to [place], [place], [place], or [place]; in the last two weeks or you have been in close contact with a person diagnosed with COVID-19 in the last 2 weeks.   . Tell the health care staff about your symptoms: fever, cough and shortness of breath. . After you have been seen by a medical provider, you will be either: o Tested for (COVID-19) and discharged home on quarantine except to seek medical care if symptoms worsen, and asked to  - Stay home and avoid contact with others until you get your results (4-5 days)  - Avoid travel on public transportation if possible (such as bus, train, or airplane) or o Sent to the Emergency Department by EMS for evaluation, COVID-19 testing, and possible admission depending on your condition and test results.  What to do if you are LOW RISK for COVID-19?  Reduce your risk of any infection by using the same precautions used for avoiding the common cold or flu:  Marland Kitchen Wash your hands often with soap and warm water for at least 20 seconds.  If soap and water are not readily available, use an alcohol-based hand sanitizer with at least 60% alcohol.  . If coughing or sneezing, cover your mouth and nose by coughing or sneezing  into the elbow areas of your shirt or coat, into a tissue or into your sleeve (not your hands). . Avoid shaking hands with others and consider head nods or verbal greetings only. . Avoid touching your eyes, nose, or mouth with unwashed hands.  . Avoid close contact with people who are sick. . Avoid places or events with large numbers of people in one location, like concerts or sporting events. . Carefully consider travel plans you have or are making. . If you are planning any travel outside or inside the [place], visit the CDC's Travelers' Health webpage for the latest  health notices. . If you have some symptoms but not all symptoms, continue to monitor at home and seek medical attention if your symptoms worsen. . If you are having a medical emergency, call 911.   [place] / e-Visit: [email or web address]         [facility]: [facility]: [phone]                   [facility]: [phone]           It is flu season:   >>> Best ways to protect herself from the flu: Receive the yearly flu vaccine, practice good hand hygiene washing with soap and also using hand sanitizer when available, eat a nutritious meals, get adequate rest, hydrate appropriately   Please contact the office if your symptoms worsen or you have concerns that you are not improving.   Thank you for choosing  [facility] for your healthcare, and for allowing [place] to partner with you on your healthcare journey. I am thankful to be able to provide care to you today.   Wyn Quaker FNP-C    Home Oxygen Use, Adult When a medical condition keeps you from getting enough oxygen, your health care provider may instruct you to take extra oxygen at home. Your health care provider will let you know:  When to take oxygen.  For how long to take oxygen.  How quickly oxygen should be delivered (flow rate), in liters per minute (LPM or L/M). Home oxygen can be given through:  A mask.  A nasal cannula. This is a device or tube that goes in the nostrils.  A transtracheal catheter. This is a small, flexible tube placed in the trachea.  A tracheostomy. This is a surgically made opening in the trachea. These devices are connected with tubing to an oxygen source, such as:  A tank. Tanks hold oxygen in gas form. They must be replaced when the oxygen is used up.  A liquid oxygen device. This holds oxygen in liquid form. It  must be replaced when the oxygen is used up.  An oxygen concentrator machine. This filters oxygen in the room. It uses electricity, so you must have a backup cylinder of oxygen in case the power goes out. Supplies needed: To use oxygen, you will need:  A mask, nasal cannula, transtracheal catheter, or tracheostomy.  An oxygen tank, a liquid oxygen device, or an oxygen concentrator.  The tape that your health care provider recommends (optional). If you use a transtracheal catheter and your prescribed flow rate is 1 LPM or greater, you will also need a humidifier. Risks and complications  Fire. This can happen if the oxygen is exposed to a heat source, flame, or spark.  Injury to skin. This can happen if liquid oxygen touches your skin.  Organ damage. This can happen if you get too little oxygen. How to use oxygen Your health care provider or a representative from your [place] will show you how to use your oxygen device. Follow her or his instructions. The instructions may look something like this: 1. Wash your hands. 2. If you use an oxygen concentrator, make sure it is plugged in. 3. Place one end of the tube into the port on the tank, device, or machine. 4. Place the mask over your nose and mouth. Or, place the nasal cannula and secure it with tape if instructed. If you use a tracheostomy or transtracheal catheter, connect it to the oxygen source as directed. 5. Make sure the liter-flow setting on the machine is at the level prescribed by your health care provider. 6. Turn on the machine or adjust the knob on the tank or device to the correct liter-flow setting. 7. When you are done, turn off and unplug the machine, or turn the knob to OFF. How to clean and care for the oxygen supplies Nasal cannula  Clean it with a warm, wet cloth daily or as needed.  Wash it with a liquid soap once a week.  Rinse it thoroughly once or twice a week.  Replace it every 2-4  weeks.  If you have an infection, such as a cold or pneumonia, change the cannula when you get better. Mask  Replace it every 2-4 weeks.  If you have an infection, such as a cold or pneumonia, change the mask when you get better. Humidifier bottle  Wash the bottle between each refill: ? Wash it with soap and warm water. ? Rinse it thoroughly. ? Disinfect it and its top. ? Air-dry it.  Make sure it is dry before you refill it. Oxygen concentrator  Clean the air filter at least twice a week according to directions from your home medical equipment and service company.  Wipe down the cabinet every day. To do this: ? Unplug the unit. ? Wipe down the cabinet with a damp cloth. ? Dry the cabinet. Other equipment  Change any extra tubing every 1-3 months.  Follow instructions from your health care provider about taking care of any other equipment. Safety tips Fire safety tips   Keep your oxygen and oxygen supplies at least 5 ft away from sources of heat, flames, and sparks at all times.  Do not allow smoking near your oxygen. Put up "no smoking" signs in your home. Avoid smoking areas when in public.  Do not use materials that can burn (are flammable) while you use oxygen.  When you go to a restaurant with portable oxygen, ask to be seated in the nonsmoking section.  Keep a Data processing manager close by. Let your fire department know that you have oxygen in your home.  Test your home smoke detectors regularly. Traveling  Secure your oxygen tank in the vehicle so that it does not move around. Follow instructions from your medical device company about how to safely secure your tank.  Make sure you have enough oxygen for the amount of time you will be away from home.  If you are planning air travel, contact the airline to find out if they allow the use of an approved portable oxygen concentrator. You may also need documents from your health care provider and medical device  company before you travel. General safety tips  If you use an oxygen cylinder, make sure it is in a stand or secured to  an object that will not move (fixed object).  If you use liquid oxygen, make sure its container is kept upright.  If you use an oxygen concentrator: ? Dance movement psychotherapist company. Make sure you are given priority service in the event that your power goes out. ? Avoid using extension cords, if possible. Follow these instructions at home:  Use oxygen only as told by your health care provider.  Do not use alcohol or other drugs that make you relax (sedating drugs) unless instructed. They can slow down your breathing rate and make it hard to get in enough oxygen.  Know how and when to order a refill of oxygen.  Always keep a spare tank of oxygen. Plan ahead for holidays when you may not be able to get a prescription filled.  Use water-based lubricants on your lips or nostrils. Do not use oil-based products like petroleum jelly.  To prevent skin irritation on your cheeks or behind your ears, tuck some gauze under the tubing. Contact a health care provider if:  You get headaches often.  You have shortness of breath.  You have a lasting cough.  You have anxiety.  You are sleepy all the time.  You develop an illness that affects your breathing.  You cannot exercise at your regular level.  You are restless.  You have difficult or irregular breathing, and it is getting worse.  You have a fever.  You have persistent redness under your nose. Get help right away if:  You are confused.  You have blue lips or fingernails.  You are struggling to breathe. Summary  Your health care provider or a representative from your [place] will show you how to use your oxygen device. Follow her or his instructions.  If you use an oxygen concentrator, make sure it is plugged in.  Make sure the liter-flow setting on the machine is at the level prescribed by  your health care provider.  Keep your oxygen and oxygen supplies at least 5 ft away from sources of heat, flames, and sparks at all times. This information is not intended to replace advice given to you by your health care provider. Make sure you discuss any questions you have with your health care provider. Document Released: 09/26/2003 Document Revised: 12/23/2017 Document Reviewed: 01/28/2016 Elsevier Patient Education  [place].    COPD and Physical Activity Chronic obstructive pulmonary disease (COPD) is a long-term (chronic) condition that affects the lungs. COPD is a general term that can be used to describe many different lung problems that cause lung swelling (inflammation) and limit airflow, including chronic bronchitis and emphysema. The main symptom of COPD is shortness of breath, which makes it harder to do even simple tasks. This can also make it harder to exercise and be active. Talk with your health care provider about treatments to help you breathe better and actions you can take to prevent breathing problems during physical activity. What are the benefits of exercising with COPD? Exercising regularly is an important part of a healthy lifestyle. You can still exercise and do physical activities even though you have COPD. Exercise and physical activity improve your shortness of breath by increasing blood flow (circulation). This causes your heart to pump more oxygen through your body. Moderate exercise can improve your:  Oxygen use.  Energy level.  Shortness of breath.  Strength in your breathing muscles.  Heart health.  Sleep.  Self-esteem and feelings of self-worth.  Depression, stress, and anxiety levels. Exercise  can benefit everyone with COPD. The severity of your disease may affect how hard you can exercise, especially at first, but everyone can benefit. Talk with your health care provider about how much exercise is safe for you, and which activities and  exercises are safe for you. What actions can I take to prevent breathing problems during physical activity?  Sign up for a pulmonary rehabilitation program. This type of program may include: ? Education about lung diseases. ? Exercise classes that teach you how to exercise and be more active while improving your breathing. This usually involves:  Exercise using your lower extremities, such as a stationary bicycle.  About 30 minutes of exercise, 2 to 5 times per week, for 6 to 12 weeks  Strength training, such as push ups or leg lifts. ? Nutrition education. ? Group classes in which you can talk with others who also have COPD and learn ways to manage stress.  If you use an oxygen tank, you should use it while you exercise. Work with your health care provider to adjust your oxygen for your physical activity. Your resting flow rate is different from your flow rate during physical activity.  While you are exercising: ? Take slow breaths. ? Pace yourself and do not try to go too fast. ? Purse your lips while breathing out. Pursing your lips is similar to a kissing or whistling position. ? If doing exercise that uses a quick burst of effort, such as weight lifting:  Breathe in before starting the exercise.  Breathe out during the hardest part of the exercise (such as raising the weights). Where to find support You can find support for exercising with COPD from:  Your health care provider.  A pulmonary rehabilitation program.  Your local health department or community health programs.  Support groups, online or in-person. Your health care provider may be able to recommend support groups. Where to find more information You can find more information about exercising with COPD from:  American Lung Association: ClassInsider.se.  COPD Foundation: https://www.rivera.net/. Contact a health care provider if:  Your symptoms get worse.  You have chest pain.  You have nausea.  You have a  fever.  You have trouble talking or catching your breath.  You want to start a new exercise program or a new activity. Summary  COPD is a general term that can be used to describe many different lung problems that cause lung swelling (inflammation) and limit airflow. This includes chronic bronchitis and emphysema.  Exercise and physical activity improve your shortness of breath by increasing blood flow (circulation). This causes your heart to provide more oxygen to your body.  Contact your health care provider before starting any exercise program or new activity. Ask your health care provider what exercises and activities are safe for you. This information is not intended to replace advice given to you by your health care provider. Make sure you discuss any questions you have with your health care provider. Document Released: 07/29/2017 Document Revised: 10/26/2018 Document Reviewed: 07/29/2017 Elsevier Patient Education  2020 [employer].

## 2019-02-28 NOTE — Progress Notes (Signed)
Chest Xray stable. No acute changes. Proceed forward with blood work.   Wyn Quaker FNP

## 2019-02-28 NOTE — Telephone Encounter (Signed)
Called and spoke with pt letting her know that Aaron Edelman discussed OV with CY and per discussion, pt is to complete labwork. Stated to her to go to [facility] lab at her convenience to get labs done and pt said she would go sometime this week for the labs. Also stated to pt that we were going to change her from seeing Aaron Edelman for the follow up to her seeing Dr. Annamaria Boots and pt verbalized understanding. I have cancelled pt's appt that was scheduled with Aaron Edelman and have scheduled pt for an appt with CY.nothing further needed.

## 2019-02-28 NOTE — Telephone Encounter (Signed)
Order has been fixed. Nothing further was needed.

## 2019-02-28 NOTE — Assessment & Plan Note (Signed)
Plan: Continue to work on increasing physical activity to reduce body weight Start 2 L of O2 at night when established with oxygen

## 2019-02-28 NOTE — Assessment & Plan Note (Signed)
Plan: Stop Bevespi Trial of Stiolto Respimat Can continue to use albuterol nebulized meds every 6 hours as needed for shortness of breath or wheezing Could consider pulmonary function testing again over the next 6 months or so Increase daily physical activity Start oxygen with physical activity Follow-up with our office in 6 weeks, also set up an appointment for Dr. Janee Morn next available

## 2019-02-28 NOTE — Telephone Encounter (Signed)
02/28/2019 1437  I discussed the case with Dr. Annamaria Boots.  He reports it is okay to lift 1 of his held spots in September/2020 for the patient to be scheduled with him.  Okay to cancel follow-up with me at that time.  I would also like the patient to complete baseline lab work.  Patient can complete this at the [facility] lab location.  Please provide patient with instructions.  Wyn Quaker, FNP

## 2019-02-28 NOTE — Telephone Encounter (Signed)
Catron, [place], Valentina Lucks, Allyne Gee, [place]; Jonn Shingles L        When the 3lpm on exertion is added back if it is tonight please fax to [phone], with BT ID [phone] on the coverpage. I am calling the patient and letting her know if she gets sob prior to [place] getting the revised order she will need to call ems or go to er.

## 2019-02-28 NOTE — Telephone Encounter (Signed)
02/28/2019 2046  Unsure what else is needed from [facility].  He clearly states in the note the patient needs 3 L of O2 with physical exertion.  It states that in our plan it also states that in the walk. Section of A and P below:   Walk today in office revealed patient requires 3 L of O2 with physical exertion Start 3 L of O2 with physical exertion Follow-up with our office in 6 weeks as well as Dr. Janee Morn next available  Triage, can you please contact [facility] and see why they are unable to get the patient started with oxygen who clearly needs it.    If [facility] is unable to provide the patient with oxygen they need to let us know so we can have the patient be established with a DME company that I can.  Wyn Quaker FNP

## 2019-02-28 NOTE — Assessment & Plan Note (Addendum)
Plan: Chest x-ray today Labwork today  Walk today in office revealed patient requires 3 L of O2 with physical exertion Start 3 L of O2 with physical exertion Follow-up with our office in 6 weeks as well as Dr. Janee Morn next available

## 2019-03-01 ENCOUNTER — Telehealth: Payer: Self-pay | Admitting: Pulmonary Disease

## 2019-03-01 ENCOUNTER — Other Ambulatory Visit (INDEPENDENT_AMBULATORY_CARE_PROVIDER_SITE_OTHER): Payer: Medicare Other

## 2019-03-01 DIAGNOSIS — R0602 Shortness of breath: Secondary | ICD-10-CM | POA: Diagnosis not present

## 2019-03-01 DIAGNOSIS — J9611 Chronic respiratory failure with hypoxia: Secondary | ICD-10-CM

## 2019-03-01 LAB — COMPREHENSIVE METABOLIC PANEL
ALT: 15 U/L (ref 0–35)
AST: 18 U/L (ref 0–37)
Albumin: 4.1 g/dL (ref 3.5–5.2)
Alkaline Phosphatase: 72 U/L (ref 39–117)
BUN: 31 mg/dL — ABNORMAL HIGH (ref 6–23)
CO2: 22 mEq/L (ref 19–32)
Calcium: 9.4 mg/dL (ref 8.4–10.5)
Chloride: 109 mEq/L (ref 96–112)
Creatinine, Ser: 1.21 mg/dL — ABNORMAL HIGH (ref 0.40–1.20)
GFR: 52.67 mL/min — ABNORMAL LOW (ref >60.00)
Glucose, Bld: 107 mg/dL — ABNORMAL HIGH (ref 70–99)
Potassium: 4.3 mEq/L (ref 3.5–5.1)
Sodium: 142 mEq/L (ref 135–145)
Total Bilirubin: 0.3 mg/dL (ref 0.2–1.2)
Total Protein: 6.6 g/dL (ref 6.0–8.3)

## 2019-03-01 LAB — CBC WITH DIFFERENTIAL/PLATELET
Basophils Absolute: 0.1 10*3/uL (ref 0.0–0.1)
Basophils Relative: 1.1 % (ref 0.0–3.0)
Eosinophils Absolute: 0.3 10*3/uL (ref 0.0–0.7)
Eosinophils Relative: 4.2 % (ref 0.0–5.0)
HCT: 45.9 % (ref 36.0–46.0)
Hemoglobin: 14.6 g/dL (ref 12.0–15.0)
Lymphocytes Relative: 16.2 % (ref 12.0–46.0)
Lymphs Abs: 1 10*3/uL (ref 0.7–4.0)
MCHC: 31.9 g/dL (ref 30.0–36.0)
MCV: 82.4 fl (ref 78.0–100.0)
Monocytes Absolute: 0.9 10*3/uL (ref 0.1–1.0)
Monocytes Relative: 14.8 % — ABNORMAL HIGH (ref 3.0–12.0)
Neutro Abs: 4 10*3/uL (ref 1.4–7.7)
Neutrophils Relative %: 63.7 % (ref 43.0–77.0)
Platelets: 196 10*3/uL (ref 150.0–400.0)
RBC: 5.56 Mil/uL — ABNORMAL HIGH (ref 3.87–5.11)
RDW: 15.2 % (ref 11.5–15.5)
WBC: 6.2 10*3/uL (ref 4.0–10.5)

## 2019-03-01 NOTE — Telephone Encounter (Signed)
Looks like this message got closed before the nurse looked at it

## 2019-03-01 NOTE — Telephone Encounter (Signed)
New order has been placed for [facility].

## 2019-03-01 NOTE — Telephone Encounter (Signed)
Stenson, Melissa  Catron, Samuel [place]; [place], Allyne Gee, [place]; [place] Dense, Vinson Moselle, NP        Good morning Aaron Edelman.   Just wanted to clarify with you that we are not questioning whether or not the patient needs or qualifies for oxygen. This request had to do with the way the orders were written and that they did not comply with insurance requirements.   The first order was not done on the template that includes Length of Need which is required by insurance. The second order did include Length of Need but the frequency was left off. The second order just had PRN for the frequency when it should have had 3lpm with exertion as originally prescribed. Insurance will not accept an order for, or pay for PRN only oxygen.   I hope this helps explain what we were needing and asking for. I will be away from Epic access today but please feel free to call me or text me if you have any questions or concerns. I never want you to feel frustrated so please reach out anytime.    Thanks so much for letting us help care for your patients.  Melissa  [facility]  [phone]

## 2019-03-02 ENCOUNTER — Telehealth: Payer: Self-pay | Admitting: Pulmonary Disease

## 2019-03-02 LAB — PRO B NATRIURETIC PEPTIDE: NT-Pro BNP: 68 pg/mL (ref 0–301)

## 2019-03-02 NOTE — Telephone Encounter (Signed)
Notes recorded by Lauraine Rinne, NP on 03/02/2019 at 9:59 AM EDT  Lab work stable patient is not retaining fluid. This is good news. Patient also does not have anemia. This is also good news. Patient's blood work does show slightly reduced kidney functioning. I would recommend the patient avoid using ibuprofen and continue to drink water. We may need to consider repeating this blood work in September/2020 when patient sees Dr. Annamaria Boots.   Please also route this Lab work to patient's primary care provider.   No further changes in patient's plan of care today.   Wyn Quaker, FNP    Called and spoke with pt letting her know the results of the labwork and the info stated by Aaron Edelman. Pt verbalized understanding.i have also routed lab results to PCP. Nothing further needed.

## 2019-03-02 NOTE — Telephone Encounter (Signed)
This order was corrected on 03/01/2019 by Cherina. LMTCB x1 for pt.

## 2019-03-02 NOTE — Progress Notes (Signed)
Lab work stable patient is not retaining fluid.  This is good news.  Patient also does not have anemia.  This is also good news.  Patient's blood work does show slightly reduced kidney functioning.  I would recommend the patient avoid using ibuprofen and continue to drink water.  We may need to consider repeating this blood work in September/2020 when patient sees Dr. Annamaria Boots.  Please also route this Lab work to patient's primary care provider.  No further changes in patient's plan of care today.  Wyn Quaker, FNP

## 2019-03-03 NOTE — Telephone Encounter (Signed)
Ok. I spoke with pt and advised her that I would call [facility] Monday since they are closed. I want to see if they could get her the smaller tanks in the meantime. Will leave in triage to follow up.

## 2019-03-03 NOTE — Telephone Encounter (Signed)
ATC pt, no answer. Left message for pt to call back.  

## 2019-03-03 NOTE — Telephone Encounter (Signed)
Doubt [facility] has POCs.  Would consider a walk in office at 03/24/2019 office visit with Dr. Annamaria Boots.Wyn Quaker, FNP

## 2019-03-03 NOTE — Telephone Encounter (Signed)
Pt returning call

## 2019-03-03 NOTE — Telephone Encounter (Signed)
Spoke with pt, she is requesting a POC from [facility] because she is unable to carry the tanks she hasnow. We could schedule her for a walk test but I am unsure if [facility] has POCS. Aaron Edelman can we schedule pt for walk test and possibly write an order if she qualifies on pulse dose. Please advise.    [phone] Daughter cell [name] [phone]

## 2019-03-04 DIAGNOSIS — J449 Chronic obstructive pulmonary disease, unspecified: Secondary | ICD-10-CM | POA: Diagnosis not present

## 2019-03-04 DIAGNOSIS — J440 Chronic obstructive pulmonary disease with acute lower respiratory infection: Secondary | ICD-10-CM | POA: Diagnosis not present

## 2019-03-04 DIAGNOSIS — G4733 Obstructive sleep apnea (adult) (pediatric): Secondary | ICD-10-CM | POA: Diagnosis not present

## 2019-03-04 DIAGNOSIS — J209 Acute bronchitis, unspecified: Secondary | ICD-10-CM | POA: Diagnosis not present

## 2019-03-06 NOTE — Telephone Encounter (Signed)
I have sent [name] with [facility] a community message for her to look into this for [place]. Once I have a response from [place], I will update.

## 2019-03-06 NOTE — Telephone Encounter (Signed)
Received community message response from [place] which I have posted below:  [place], Mariann Barter, Samuel Germany; Devontay Celaya, Waldemar Dickens, CMA; Darlina Guys; Elon Alas        Per our delivery department --- We took out a homefill system because she asked for that and because we are out of stock on portable concentrators. If she needs smaller tanks we will have to do a best fit eval I believe.    Stated to Levada Dy to let us know if there is anything that we were needing to do.

## 2019-03-23 DIAGNOSIS — M130 Polyarthritis, unspecified: Secondary | ICD-10-CM | POA: Diagnosis not present

## 2019-03-24 ENCOUNTER — Ambulatory Visit (INDEPENDENT_AMBULATORY_CARE_PROVIDER_SITE_OTHER): Payer: Medicare Other | Admitting: Internal Medicine

## 2019-03-24 ENCOUNTER — Ambulatory Visit
Admission: RE | Admit: 2019-03-24 | Discharge: 2019-03-24 | Disposition: A | Discharge status: Home / Self Care | Payer: Medicare Other | Source: Ambulatory Visit | Attending: Family Medicine | Admitting: Family Medicine

## 2019-03-24 ENCOUNTER — Other Ambulatory Visit: Payer: Self-pay

## 2019-03-24 ENCOUNTER — Other Ambulatory Visit: Payer: Self-pay | Admitting: Family Medicine

## 2019-03-24 ENCOUNTER — Encounter: Payer: Self-pay | Admitting: Internal Medicine

## 2019-03-24 VITALS — BP 128/82 | HR 75 | Temp 98.9°F | Ht 60.0 in | Wt 218.0 lb

## 2019-03-24 DIAGNOSIS — J449 Chronic obstructive pulmonary disease, unspecified: Secondary | ICD-10-CM

## 2019-03-24 DIAGNOSIS — Z23 Encounter for immunization: Secondary | ICD-10-CM | POA: Diagnosis not present

## 2019-03-24 DIAGNOSIS — R52 Pain, unspecified: Secondary | ICD-10-CM

## 2019-03-24 DIAGNOSIS — M5136 Other intervertebral disc degeneration, lumbar region: Secondary | ICD-10-CM | POA: Diagnosis not present

## 2019-03-24 DIAGNOSIS — J9611 Chronic respiratory failure with hypoxia: Secondary | ICD-10-CM

## 2019-03-24 DIAGNOSIS — E162 Hypoglycemia, unspecified: Secondary | ICD-10-CM | POA: Diagnosis not present

## 2019-03-24 DIAGNOSIS — M533 Sacrococcygeal disorders, not elsewhere classified: Secondary | ICD-10-CM | POA: Diagnosis not present

## 2019-03-24 LAB — BASIC METABOLIC PANEL
BUN: 25 mg/dL — ABNORMAL HIGH (ref 6–23)
CO2: 30 mEq/L (ref 19–32)
Calcium: 9.2 mg/dL (ref 8.4–10.5)
Chloride: 106 mEq/L (ref 96–112)
Creatinine, Ser: 1.17 mg/dL (ref 0.40–1.20)
GFR: 54.75 mL/min — ABNORMAL LOW (ref >60.00)
Glucose, Bld: 93 mg/dL (ref 70–99)
Potassium: 4.1 mEq/L (ref 3.5–5.1)
Sodium: 142 mEq/L (ref 135–145)

## 2019-03-24 NOTE — Patient Instructions (Addendum)
Order flu vax   Senior  We can ask [facility] about current availability of a POC portable concentrator. If they are no longer providing these, we might be able to change you to [facility].   Order- lab BMET    Dx hypoglycemia  Please call if we can help

## 2019-03-24 NOTE — Progress Notes (Signed)
HPI female former smoker followed for COPD, Chronic Hypoxic Resp Failure, history left lower lobe resection/chemotherapy 0737 NSCCa, complicated by DM, obesity/lap band surgery PFT 07/05/2008-moderate obstructive airways disease with response to bronchodilator, diffusion moderately reduced. FEV1 1.75/87%, FEV1/FVC 0.53, DLCO 49 Office Spirometry 07/04/2015-mild airway obstruction. FEV1/FVC 0.55 Office Spirometry 03/06/2016- mild obstructive airways disease-FVC 2.01/104%, FEV1 1.16/78%, ratio 0.58, FEF 25-75% 0.61/ 43% NPSG 07/03/13- AHI 5.3/ hr, weight 234 pounds PFT 05/03/17-mild obstructive airways disease, no response to bronchodilator.  Diffusion moderately reduced.  FEV1/FVC 0.59, DLCO 59%  --------------------------------------------------------------------------------------------------------------  12/30/2017- 48  female former smoker followed for COPD, history left lower lobe resection/chemotherapy 1062 NSCCa, complicated by DM 2, obesity / lap band surgery -----COPD. Her breathing is unchanged. No new co's. She rarely uses her albuterol inhaler or neb. Cough is not bothersome today, but she req refill on hydrocodone cough syrup to have on hand prn.  Asks renewal HC parking Bevespi, neb albuterol, Proair Respiclick,  Occasionally bothered by pollen this spring but never bad.  Using rescue inhaler only occasionally.  Has had some drops in blood sugar and is wearing monitor tracking that. Dysphagia on swallowing Scherzer sometimes because she is alone.  She tries to do Heimlich on herself leaning over back of chair when this happens.  Feels difficulty mostly at the level of the larynx.  03/24/2019- 59  female former smoker followed for COPD, Chronic Hypoxic Resp Failure, history left lower lobe resection/chemotherapy 6948 NSCCa, complicated by DM 2, obesity / lap band surgery O2 2L/ [facility] pt states her breathing is at baseline with O2 2L continuous Seen by NP 8/11> started home O2, changed  Bevespi to [employer], ordered PFT-pending Asks POC for flexibility- discussed in detail. On 30 day course of prednisone 2.5 mg for Abnl chem- renal function CXR 02/28/2019-  Postoperative change on the left with areas of scarring. No edema or consolidation. Stable cardiac silhouette. No evident adenopathy.  ROS-see HPI + = positive Constitutional:   No-   weight loss, night sweats, fevers, chills,+ fatigue, lassitude. HEENT:   No-  headaches, +difficulty swallowing, tooth/dental problems, sore throat,       No-  sneezing, itching, ear ache, +nasal congestion, post nasal drip,  CV:  No-   chest pain, orthopnea, PND, swelling in lower extremities, anasarca, dizziness, palpitations Resp: +  shortness of breath with exertion or at rest.        productive cough,  non-productive cough,   coughing up of blood.              No-   change in color of mucus. wheezing.   Skin: No-   rash or lesions. GI:  No-   heartburn, indigestion, abdominal pain, nausea, vomiting, GU: . MS:  No-   joint pain or swelling.   cramping myalgias Neuro-     nothing unusual Psych:  No- change in mood or affect. No depression or anxiety.  No memory loss.  OBJ General- Alert, Oriented, Affect-appropriate, Distress- none acute, +obese Skin- rash-none, lesions- none, excoriation- none.  Lymphadenopathy- none Head- atraumatic            Eyes- Gross vision intact, PERRLA, conjunctivae clear secretions            Ears- Hearing, canals-normal            Nose- Clear, no-Septal dev, mucus, polyps, erosion, perforation             Throat- Mallampati III-IV , mucosa + red , drainage- none, tonsils- atrophic  Neck- flexible , trachea midline, no stridor , thyroid nl, carotid no bruit Chest - symmetrical excursion , unlabored           Heart/CV- RRR , no murmur , no gallop  , no rub, nl s1 s2                           - JVD- none , edema- none, stasis changes- none, varices- none           Lung- clear, wheeze- none, cough-none ,  dullness-none, rub- none           Chest wall- scar left chest Port-A-Cath Abd-  Br/ Gen/ Rectal- Not done, not indicated Extrem- cyanosis- none, clubbing, none, atrophy- none, strength- nl Neuro- grossly intact to observation

## 2019-03-28 ENCOUNTER — Telehealth: Payer: Self-pay | Admitting: Internal Medicine

## 2019-03-28 NOTE — Telephone Encounter (Signed)
Advised pt of results. Pt understood and nothing further is needed.   

## 2019-03-28 NOTE — Telephone Encounter (Signed)
LMTCB x 1 

## 2019-03-28 NOTE — Telephone Encounter (Signed)
Noted- [facility] is fine for POC

## 2019-03-28 NOTE — Telephone Encounter (Signed)
[name] has sent this order to [facility]

## 2019-03-28 NOTE — Telephone Encounter (Signed)
Before hanging up, she mentioned that she was needing smaller oxygen concentrator. She is very upset about the service from [employer] and she states she cannot use the smaller tanks because it takes 3 hours to fill up. She is still working and needs a POC. She wants to switch DME companies all together at this point. CY is it ok to change DME companies? Please advise.   Pcc's can she get a POC from another company if she is unable to switch due to insurance? Please advise.

## 2019-03-28 NOTE — Telephone Encounter (Signed)
LMTCB Notes recorded by Deneise Lever, MD on 03/24/2019 at 4:29 PM EDT  Lab- kidney function looks better this time, compared with 3 weeks ago. Still borderline abnormal. Suggest she follow it with her primary doctor.

## 2019-03-28 NOTE — Telephone Encounter (Signed)
Patient is returning phone call.  Patient phone number is [phone].

## 2019-04-01 DIAGNOSIS — J9611 Chronic respiratory failure with hypoxia: Secondary | ICD-10-CM | POA: Insufficient documentation

## 2019-04-01 NOTE — Assessment & Plan Note (Addendum)
Using O2 as directed  Asking for POC- discussed. Plan- continue O2. May need to change DME to get a POC.

## 2019-04-01 NOTE — Assessment & Plan Note (Addendum)
Prefers Stiolto- to continue. Med education done. Flu vax

## 2019-04-03 DIAGNOSIS — K912 Postsurgical malabsorption, not elsewhere classified: Secondary | ICD-10-CM | POA: Diagnosis not present

## 2019-04-03 DIAGNOSIS — E162 Hypoglycemia, unspecified: Secondary | ICD-10-CM | POA: Diagnosis not present

## 2019-04-04 ENCOUNTER — Ambulatory Visit: Payer: Medicare Other | Admitting: Pulmonary Disease

## 2019-04-04 DIAGNOSIS — J440 Chronic obstructive pulmonary disease with acute lower respiratory infection: Secondary | ICD-10-CM | POA: Diagnosis not present

## 2019-04-04 DIAGNOSIS — J209 Acute bronchitis, unspecified: Secondary | ICD-10-CM | POA: Diagnosis not present

## 2019-04-04 DIAGNOSIS — J449 Chronic obstructive pulmonary disease, unspecified: Secondary | ICD-10-CM | POA: Diagnosis not present

## 2019-04-04 DIAGNOSIS — G4733 Obstructive sleep apnea (adult) (pediatric): Secondary | ICD-10-CM | POA: Diagnosis not present

## 2019-04-10 DIAGNOSIS — J449 Chronic obstructive pulmonary disease, unspecified: Secondary | ICD-10-CM | POA: Diagnosis not present

## 2019-04-10 DIAGNOSIS — M79604 Pain in right leg: Secondary | ICD-10-CM | POA: Diagnosis not present

## 2019-04-10 DIAGNOSIS — E1169 Type 2 diabetes mellitus with other specified complication: Secondary | ICD-10-CM | POA: Diagnosis not present

## 2019-04-12 DIAGNOSIS — K5900 Constipation, unspecified: Secondary | ICD-10-CM | POA: Diagnosis not present

## 2019-04-19 ENCOUNTER — Ambulatory Visit (INDEPENDENT_AMBULATORY_CARE_PROVIDER_SITE_OTHER): Payer: Medicare Other | Admitting: Orthopaedic Surgery

## 2019-04-19 ENCOUNTER — Encounter: Payer: Self-pay | Admitting: Orthopaedic Surgery

## 2019-04-19 ENCOUNTER — Ambulatory Visit: Payer: Self-pay

## 2019-04-19 DIAGNOSIS — G8929 Other chronic pain: Secondary | ICD-10-CM

## 2019-04-19 DIAGNOSIS — M5441 Lumbago with sciatica, right side: Secondary | ICD-10-CM | POA: Diagnosis not present

## 2019-04-19 MED ORDER — NAPROXEN 500 MG PO TABS: 500.0000 mg | ORAL_TABLET | Freq: Two times a day (BID) | ORAL | 3 refills | Status: DC

## 2019-04-19 MED ORDER — PREDNISONE 10 MG (21) PO TBPK: ORAL_TABLET | ORAL | 0 refills | Status: DC

## 2019-04-19 NOTE — Progress Notes (Signed)
Office Visit Note   Patient: [name]           Date of Birth: 01-Jan-1946           MRN: [id number] Visit Date: 04/19/2019              Requested by: [name], [address] [place],  [place] [address] PCP: Adrienne Beard, MD   Assessment & Plan: Visit Diagnoses:  1. Chronic right-sided low back pain with right-sided sciatica     Plan: Impression is right leg lumbar radiculopathy.  We will begin with prednisone taper and then 2 weeks of naproxen after that.  Hopefully this will calm it down.  If she has slight improvement we may refill her prednisone again.  If she has no improvement she will call back to let us know so that we can order an MRI of her lumbar spine.  Questions encouraged and answered.  Follow-up as needed.  Follow-Up Instructions: Return if symptoms worsen or fail to improve.   Orders:  Orders Placed This Encounter  Procedures  . XR Lumbar Spine 2-3 Views   Meds ordered this encounter  Medications  . predniSONE (STERAPRED UNI-PAK 21 TAB) 10 MG (21) TBPK tablet    Sig: Take as directed    Dispense:  21 tablet    Refill:  0  . naproxen (NAPROSYN) 500 MG tablet    Sig: Take 1 tablet (500 mg total) by mouth 2 (two) times daily with a meal.    Dispense:  30 tablet    Refill:  3      Procedures: No procedures performed   Clinical Data: No additional findings.   Subjective: Chief Complaint  Patient presents with  . Right Knee - Pain    [name] is a 73 year old female comes in for evaluation of radicular pain on the right side for the last 3 weeks.  She denies any injuries.  She does endorse burning and tingling and pain in her buttock.  She has been taking Tylenol and Motrin.  She denies any bowel or bladder incontinence or any signs or symptoms of cauda equina.   Review of Systems  Constitutional: Negative.   HENT: Negative.   Eyes: Negative.   Respiratory: Negative.   Cardiovascular: Negative.   Endocrine: Negative.    Musculoskeletal: Negative.   Neurological: Negative.   Hematological: Negative.   Psychiatric/Behavioral: Negative.   All other systems reviewed and are negative.    Objective: Vital Signs: There were no vitals taken for this visit.  Physical Exam Vitals signs and nursing note reviewed.  Constitutional:      Appearance: She is well-developed.  HENT:     Head: Normocephalic and atraumatic.  Neck:     Musculoskeletal: Neck supple.  Pulmonary:     Effort: Pulmonary effort is normal.  Abdominal:     Palpations: Abdomen is soft.  Skin:    General: Skin is warm.     Capillary Refill: Capillary refill takes less than 2 seconds.  Neurological:     Mental Status: She is alert and oriented to person, place, and time.  Psychiatric:        Behavior: Behavior normal.        Thought Content: Thought content normal.        Judgment: Judgment normal.     Ortho Exam Right lower extremity exam shows no focal motor or sensory deficits.  Reflexes are normal. Specialty Comments:  No specialty comments available.  Imaging: Xr Lumbar Spine 2-3 Views  Result Date: 04/19/2019 Multilevel degenerative disc disease and lumbar spondylosis.  There is a small anterolisthesis at L4-5    PMFS History: Patient Active Problem List   Diagnosis Date Noted  . Chronic respiratory failure with hypoxia ([place]) 04/01/2019  . Medication management 02/28/2019  . Dysphagia 12/30/2017  . Encounter for preoperative examination for general surgical procedure 03/06/2016  . Cough 08/02/2015  . Obstructive sleep apnea 08/06/2013  . COPD with acute bronchitis ([place]) 06/18/2013  . Myalgia 06/18/2013  . History of laparoscopic adjustable gastric banding, APS. 09/04/2008. 04/08/2011  . History of lung cancer 06/27/2010  . SKIN RASH 12/13/2009  . DYSPNEA 07/05/2008  . DIABETES, TYPE 2 07/01/2008  . Obesity, morbid ([place]) 07/01/2008  . COPD mixed type ([place]) 06/26/2008   Past Medical History:  Diagnosis Date   . Abdominal pain    around naval  . Arthritis   . Breast pain in female   . Bruises easily   . Cancer ([place]) 2003   lung  . Cervical disc disease   . Chronic depression   . COPD (chronic obstructive pulmonary disease) ([place])   . Diabetes mellitus   . DJD (degenerative joint disease)   . Gum disease   . Hernia   . Insomnia   . Phlebitis   . Thyroid disease   . Wears glasses   . Weight increase     Family History  Problem Relation Age of Onset  . Stroke Mother   . Stroke Father     Past Surgical History:  Procedure Laterality Date  . ABDOMINAL HYSTERECTOMY     partial  . FOOT SURGERY     right  . LAPAROSCOPIC GASTRIC BANDING  Feb 2010  . LEFT HEART CATHETERIZATION WITH CORONARY ANGIOGRAM N/A 05/02/2012   Procedure: LEFT HEART CATHETERIZATION WITH CORONARY ANGIOGRAM;  Surgeon: Laverda Page, MD;  Location: [facility] CATH LAB;  Service: Cardiovascular;  Laterality: N/A;  . LUNG LOBECTOMY  2003   left  . SPINE SURGERY     fusion on the neck  . THYROIDECTOMY     partial  . TONSILLECTOMY     Social History   Occupational History  . Not on file  Tobacco Use  . Smoking status: Former Smoker    Packs/day: 2.00    Years: 37.00    Pack years: 74.00    Types: Cigarettes    Quit date: 06/21/2002    Years since quitting: 16.8  . Smokeless tobacco: Never Used  Substance and Sexual Activity  . Alcohol use: No  . Drug use: No  . Sexual activity: Not on file

## 2019-04-27 DIAGNOSIS — K5900 Constipation, unspecified: Secondary | ICD-10-CM | POA: Diagnosis not present

## 2019-04-27 DIAGNOSIS — K602 Anal fissure, unspecified: Secondary | ICD-10-CM | POA: Diagnosis not present

## 2019-04-27 DIAGNOSIS — R300 Dysuria: Secondary | ICD-10-CM | POA: Diagnosis not present

## 2019-05-01 ENCOUNTER — Other Ambulatory Visit: Payer: Self-pay

## 2019-05-01 DIAGNOSIS — Z20822 Contact with and (suspected) exposure to covid-19: Secondary | ICD-10-CM

## 2019-05-01 NOTE — Telephone Encounter (Signed)
Pt seen in the office 03/24/2019. Will close out this encounter. Nothing further needed at this time.

## 2019-05-02 LAB — NOVEL CORONAVIRUS, NAA: SARS-CoV-2, NAA: NOT DETECTED

## 2019-05-04 DIAGNOSIS — J449 Chronic obstructive pulmonary disease, unspecified: Secondary | ICD-10-CM | POA: Diagnosis not present

## 2019-05-04 DIAGNOSIS — J9611 Chronic respiratory failure with hypoxia: Secondary | ICD-10-CM | POA: Diagnosis not present

## 2019-05-04 IMAGING — RF DG SWALLOWING FUNCTION
10 series · 19 of 24 positions shown · non-contrast
Comparison: None.

:
*NO PROFESSIONAL CHARGE FOR THIS EXAM. *
CLINICAL DATA: Dysphagia

EXAM:
MODIFIED BARIUM SWALLOW
TECHNIQUE: Different consistencies of barium were administered orally to the
patient by the Speech Pathologist. Imaging of the pharynx was
performed in the lateral projection.
FLUOROSCOPY TIME:  Fluoroscopy Time:  1 minutes and 50 seconds.
Radiation Exposure Index (if provided by the fluoroscopic device):
30.7 mGy
Number of Acquired Spot Images: 0

[Series 1: cp_standard · 0.35mm/px · 2 of 109 frames shown (1 of 10)]
[frame 17/109]
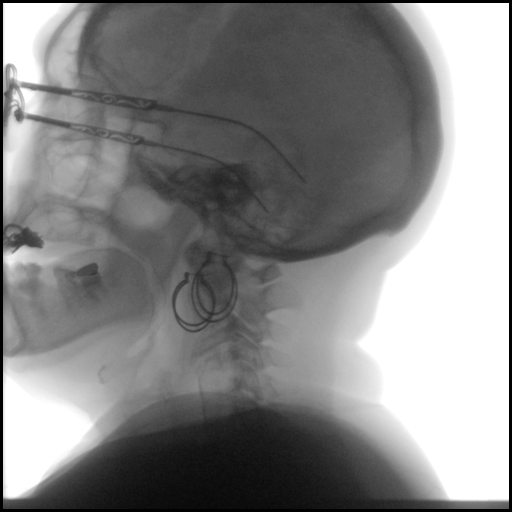
[frame 79/109]
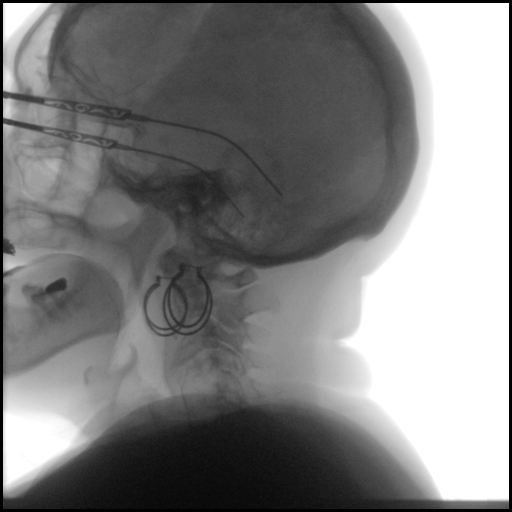

[Series 2: cp_standard · 0.35mm/px · 2 of 125 frames shown (2 of 10)]
[frame 19/125]
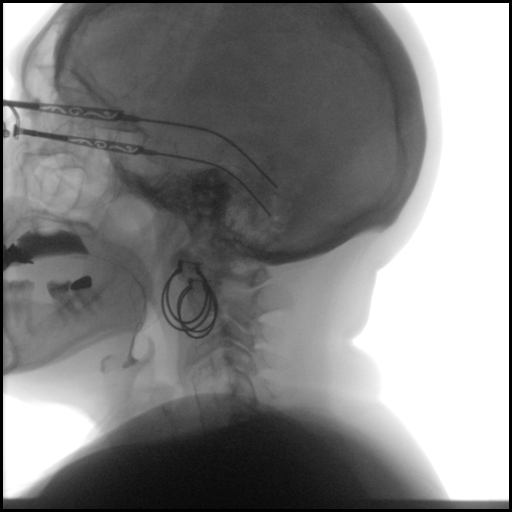
[frame 107/125]
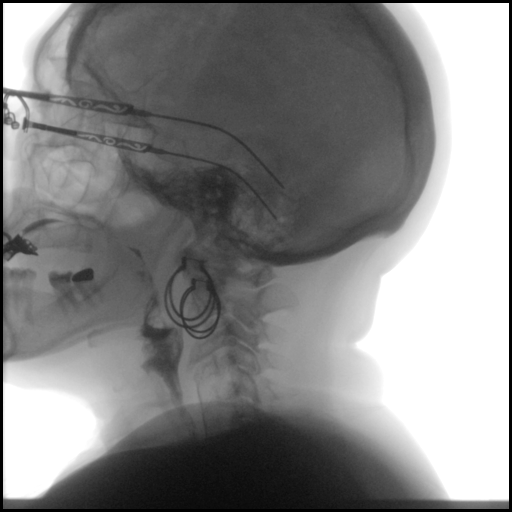

[Series 3: cp_standard · 0.35mm/px · 2 of 4 frames shown (3 of 10)]
[frame 1/4]
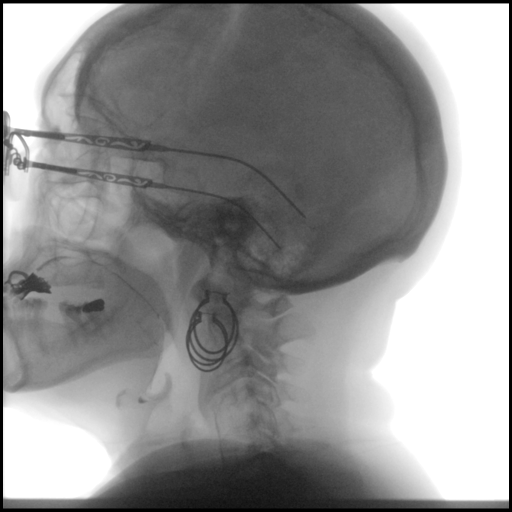
[frame 3/4]
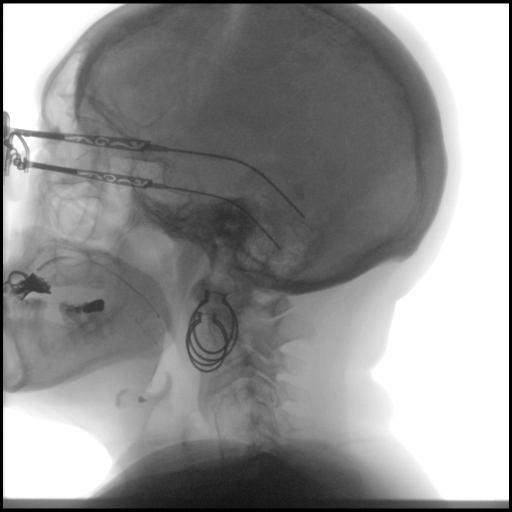

[Series 4: cp_standard · 0.35mm/px · 2 of 99 frames shown (4 of 10)]
[frame 50/99]
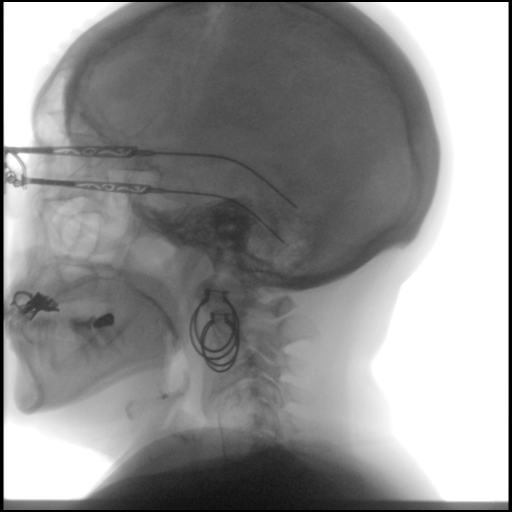
[frame 85/99]
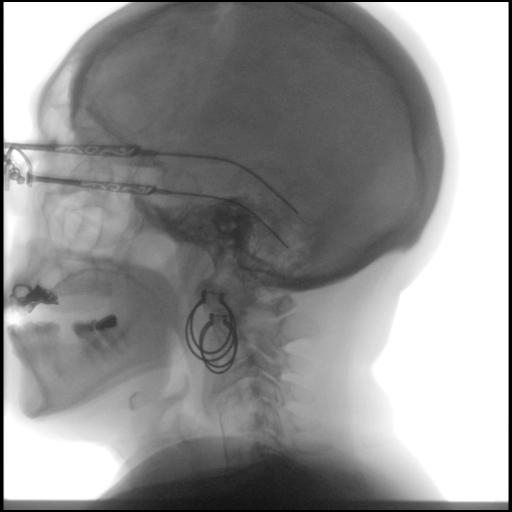

[Series 5: cp_standard · 0.35mm/px · 1 of 49 frames shown (5 of 10)]
[frame 8/49]
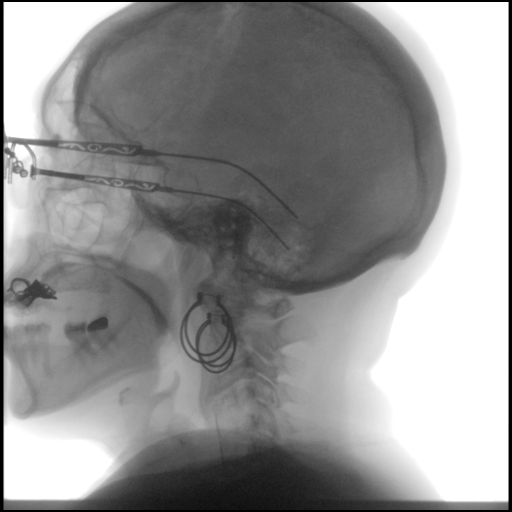

[Series 6: cp_standard · 0.35mm/px · 2 of 57 frames shown (6 of 10)]
[frame 2/57]
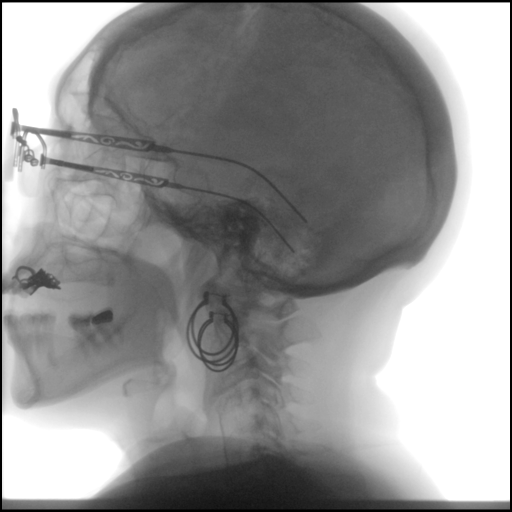
[frame 29/57]
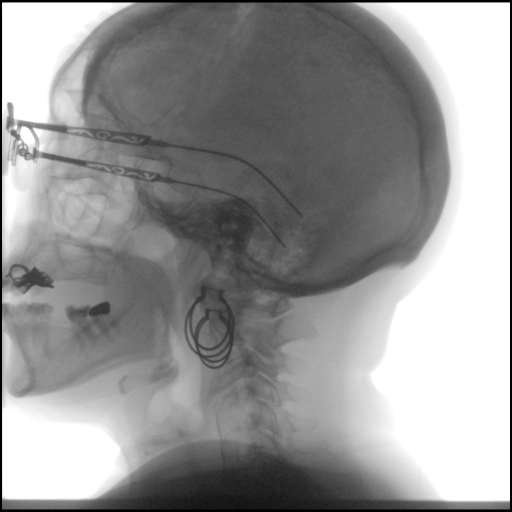

[Series 7: cp_standard · 0.35mm/px · 2 of 55 frames shown (7 of 10)]
[frame 9/55]
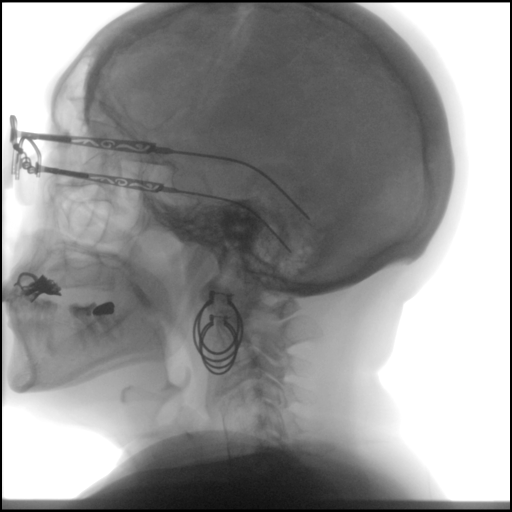
[frame 16/55]
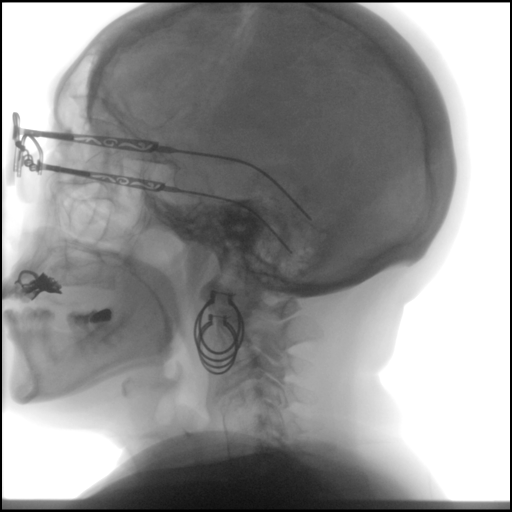

[Series 8: cp_standard · 0.35mm/px · 2 of 149 frames shown (8 of 10)]
[frame 68/149]
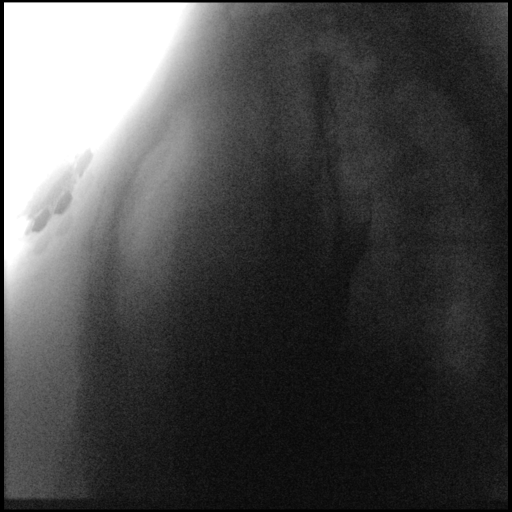
[frame 127/149]
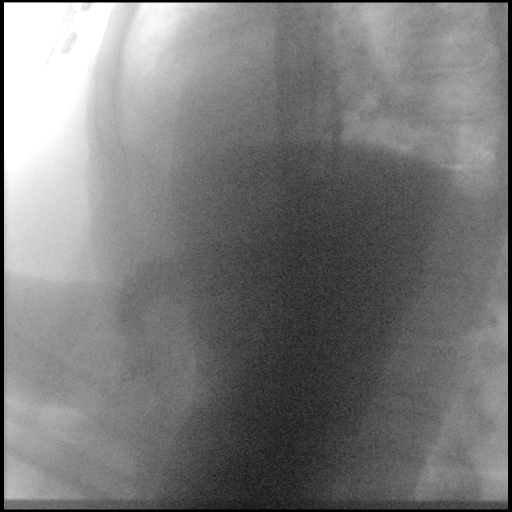

[Series 9: cp_standard · 0.35mm/px · 2 of 119 frames shown (9 of 10)]
[frame 14/119]
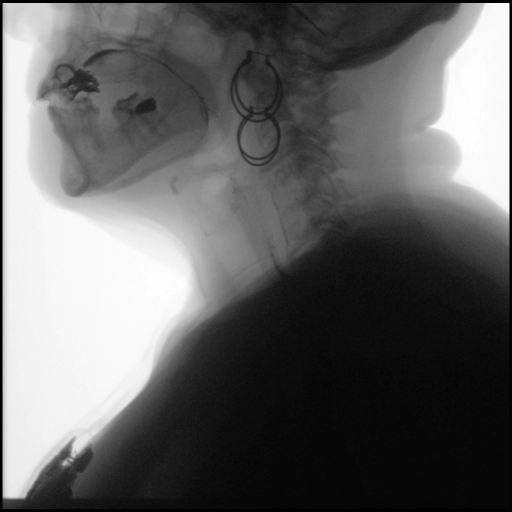
[frame 60/119]
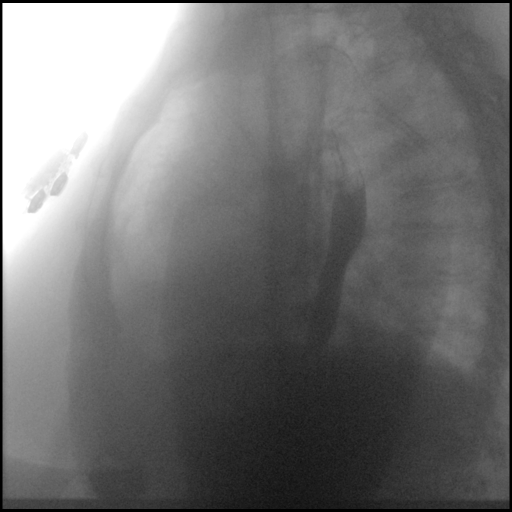

[Series 10: cp_standard · 0.35mm/px · 2 of 380 frames shown (10 of 10)]
[frame 58/380]
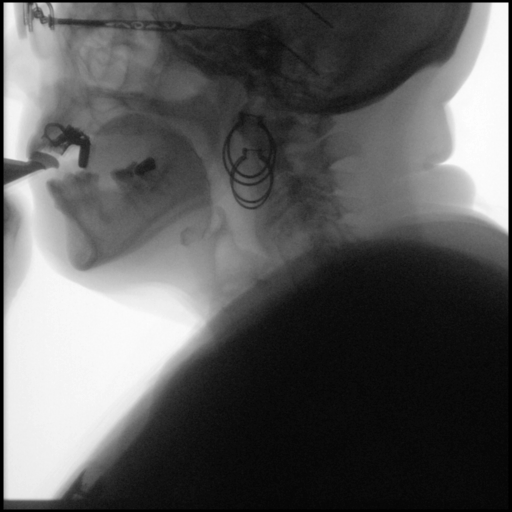
[frame 324/380]
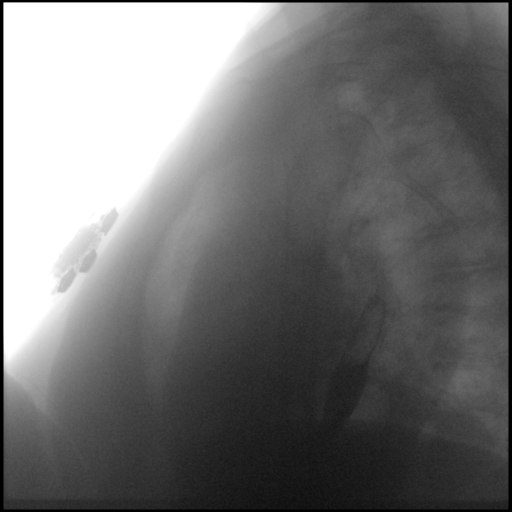

[19 of 24 positions shown; findings below may reference images not displayed]

FINDINGS: Swallowing function study was performed without a radiologist in the
room.
IMPRESSION: Please refer to the Speech Pathologists report for complete details
and recommendations.

*NO PROFESSIONAL CHARGE FOR THIS EXAM.*

## 2019-05-10 DIAGNOSIS — K5900 Constipation, unspecified: Secondary | ICD-10-CM | POA: Diagnosis not present

## 2019-05-10 DIAGNOSIS — R194 Change in bowel habit: Secondary | ICD-10-CM | POA: Diagnosis not present

## 2019-05-11 DIAGNOSIS — K5900 Constipation, unspecified: Secondary | ICD-10-CM | POA: Diagnosis not present

## 2019-05-22 DIAGNOSIS — Z0000 Encounter for general adult medical examination without abnormal findings: Secondary | ICD-10-CM | POA: Diagnosis not present

## 2019-06-04 DIAGNOSIS — J449 Chronic obstructive pulmonary disease, unspecified: Secondary | ICD-10-CM | POA: Diagnosis not present

## 2019-06-04 DIAGNOSIS — J9611 Chronic respiratory failure with hypoxia: Secondary | ICD-10-CM | POA: Diagnosis not present

## 2019-06-05 ENCOUNTER — Ambulatory Visit: Payer: Medicare Other | Admitting: Internal Medicine

## 2019-06-06 DIAGNOSIS — Z7189 Other specified counseling: Secondary | ICD-10-CM | POA: Diagnosis not present

## 2019-06-06 DIAGNOSIS — J449 Chronic obstructive pulmonary disease, unspecified: Secondary | ICD-10-CM | POA: Diagnosis not present

## 2019-06-06 DIAGNOSIS — K219 Gastro-esophageal reflux disease without esophagitis: Secondary | ICD-10-CM | POA: Diagnosis not present

## 2019-06-06 DIAGNOSIS — E1169 Type 2 diabetes mellitus with other specified complication: Secondary | ICD-10-CM | POA: Diagnosis not present

## 2019-07-04 DIAGNOSIS — J449 Chronic obstructive pulmonary disease, unspecified: Secondary | ICD-10-CM | POA: Diagnosis not present

## 2019-07-04 DIAGNOSIS — J9611 Chronic respiratory failure with hypoxia: Secondary | ICD-10-CM | POA: Diagnosis not present

## 2019-07-21 DIAGNOSIS — E11649 Type 2 diabetes mellitus with hypoglycemia without coma: Secondary | ICD-10-CM

## 2019-07-21 HISTORY — DX: Type 2 diabetes mellitus with hypoglycemia without coma: E11.649

## 2019-08-02 ENCOUNTER — Telehealth: Payer: Self-pay | Admitting: Internal Medicine

## 2019-08-02 MED ORDER — HYDROCODONE-HOMATROPINE 5-1.5 MG/5ML PO SYRP
5.0000 mL | ORAL_SOLUTION | Freq: Four times a day (QID) | ORAL | 0 refills | Status: DC | PRN
Start: 2019-08-02 — End: 2019-08-08

## 2019-08-02 NOTE — Telephone Encounter (Signed)
Hydrocodone cough syrup script e-sent to [facility]

## 2019-08-02 NOTE — Telephone Encounter (Signed)
Pt called requesting a refill of her hydrocodone cough syrup. Dr. Annamaria Boots, please advise if you are okay refilling med for pt.  No Known Allergies   Current Outpatient Medications:  .  acetaminophen (TYLENOL) 500 MG tablet, Take 1,000 mg by mouth every 6 (six) hours as needed for mild pain or moderate pain., Disp: , Rfl:  .  albuterol (PROVENTIL) (2.5 MG/3ML) 0.083% nebulizer solution, Take 3 mLs (2.5 mg total) by nebulization every 8 (eight) hours as needed for wheezing or shortness of breath., Disp: 360 mL, Rfl: 12 .  Albuterol Sulfate (PROAIR RESPICLICK) 203 (90 Base) MCG/ACT AEPB, Inhale 2 puffs into the lungs every 4 (four) hours as needed (SOB, wheezing)., Disp: 1 each, Rfl: 12 .  ALPRAZolam (XANAX) 0.25 MG tablet, TAKE 1 TABLET BY MOUTH EVERY DAY, Disp: 30 tablet, Rfl: 0 .  Biotin 5000 MCG TABS, Take 10,000 mcg by mouth daily., Disp: , Rfl:  .  cetirizine (ZYRTEC) 10 MG tablet, Take 10 mg by mouth daily., Disp: , Rfl:  .  cholecalciferol (VITAMIN D) 1000 units tablet, Take 1,000 Units by mouth daily., Disp: , Rfl:  .  diphenhydrAMINE (BENADRYL) 25 mg capsule, Take 25 mg by mouth daily as needed for itching, allergies or sleep. For itching , Disp: , Rfl:  .  Multiple Vitamin (MULTIVITAMIN) tablet, Take 1 tablet by mouth daily., Disp: , Rfl:  .  naproxen (NAPROSYN) 500 MG tablet, Take 1 tablet (500 mg total) by mouth 2 (two) times daily with a meal., Disp: 30 tablet, Rfl: 3 .  Nebulizers (COMPRESSOR/NEBULIZER) MISC, Use as directed, Disp: 1 each, Rfl: prn .  predniSONE (DELTASONE) 2.5 MG tablet, Take 2.5 mg by mouth daily with breakfast. Taking daily for 30 days starting 03/23/2019, Disp: , Rfl:  .  predniSONE (STERAPRED UNI-PAK 21 TAB) 10 MG (21) TBPK tablet, Take as directed, Disp: 21 tablet, Rfl: 0 .  Tiotropium Bromide-Olodaterol (STIOLTO RESPIMAT) 2.5-2.5 MCG/ACT AERS, Inhale 2 puffs into the lungs daily., Disp: 4 g, Rfl: 0

## 2019-08-02 NOTE — Telephone Encounter (Signed)
error 

## 2019-08-02 NOTE — Telephone Encounter (Signed)
Attempted to call pt but unable to reach. Left message for pt to return call. 

## 2019-08-08 ENCOUNTER — Telehealth: Payer: Self-pay | Admitting: Internal Medicine

## 2019-08-08 MED ORDER — HYDROCODONE-CHLORPHENIRAMINE 5-4 MG/5ML PO SOLN: ORAL | 0 refills | Status: DC

## 2019-08-08 NOTE — Telephone Encounter (Signed)
Cough syrup script e-sent, for hydrocodone- chlorpheniramine as requested.

## 2019-08-08 NOTE — Telephone Encounter (Signed)
Pt is aware that this has been taken care of. Nothing further needed.

## 2019-08-08 NOTE — Telephone Encounter (Signed)
Spoke with pt. She is having some issues with the cough syrup CY sent in for her last week. Stated, "The cough syrup that was sent in is for the red cough syrup, only the yellow cough syrup works for me." I contacted her pharmacy and the "yellow" cough syrup is Hydrocodone-Chlorpheniramine not Hydrocodone-Homatropine.  Dr. Annamaria Boots - do you think we could get this switched? Thanks!

## 2019-08-15 ENCOUNTER — Ambulatory Visit: Payer: BC Managed Care – PPO | Attending: Internal Medicine

## 2019-08-15 DIAGNOSIS — Z20822 Contact with and (suspected) exposure to covid-19: Secondary | ICD-10-CM

## 2019-08-16 LAB — NOVEL CORONAVIRUS, NAA: SARS-CoV-2, NAA: NOT DETECTED

## 2019-09-01 ENCOUNTER — Telehealth: Payer: Self-pay | Admitting: Internal Medicine

## 2019-09-01 DIAGNOSIS — J449 Chronic obstructive pulmonary disease, unspecified: Secondary | ICD-10-CM

## 2019-09-01 MED ORDER — ANORO ELLIPTA 62.5-25 MCG/INH IN AEPB: 1.0000 | INHALATION_SPRAY | Freq: Every day | RESPIRATORY_TRACT | 0 refills | Status: DC

## 2019-09-01 MED ORDER — ANORO ELLIPTA 62.5-25 MCG/INH IN AEPB: 1.0000 | INHALATION_SPRAY | Freq: Every day | RESPIRATORY_TRACT | 12 refills | Status: DC

## 2019-09-01 NOTE — Telephone Encounter (Signed)
Dr. Annamaria Boots,  I called the patient back and she stated the insurance told her Spiriva was not listed as a covered medication.  However, Anoro and Stiolto Respimat were on the approved list of medications.  She was last seen by ou on 03/24/19 for COPD mixed type and chronic respiratory failure with hypoxia.  Below is a cut/paste from those office notes:  "03/24/2019- 11  female former smoker followed for COPD, Chronic Hypoxic Resp Failure, history left lower lobe resection/chemotherapy 3016 NSCCa, complicated by DM 2, obesity / lap band surgery O2 2L/ [employer] pt states her breathing is at baseline with O2 2L continuous Seen by NP 8/11> started home O2, changed Bevespi to [employer], ordered PFT-pending Asks POC for flexibility- discussed in detail. On 30 day course of prednisone 2.5 mg for Abnl chem- renal function CXR 02/28/2019-  Postoperative change on the left with areas of scarring. No edema or consolidation. Stable cardiac silhouette. No evident adenopathy.  Prefers Stiolto- to continue. Med education done. Flu vax Using O2 as directed  Asking for POC- discussed. Plan- continue O2. May need to change DME to get a POC"  Please advise based on the above which medication can be sent to the pharmacy since insurance will not cover [place]. Thank you.

## 2019-09-01 NOTE — Telephone Encounter (Signed)
Both Anoro and Stiolto are chemically similar to [employer]. Since insurance no longer covers Bevespi,  I suggest she try Anoro. Ok to giver her a sample and print script for Anoro  # 1, inhale 1 puff, once daily, # 1, ref x 12.

## 2019-09-01 NOTE — Telephone Encounter (Signed)
Called the patient and made her aware of the response received from Dr. Annamaria Boots. Patient will come by clinic Monday to pick up sample and printed prescription.  Prescription printed for signature. Will be placed up front for pick up. Nothing further needed at this time.

## 2019-09-04 DIAGNOSIS — G4733 Obstructive sleep apnea (adult) (pediatric): Secondary | ICD-10-CM | POA: Diagnosis not present

## 2019-09-04 DIAGNOSIS — J209 Acute bronchitis, unspecified: Secondary | ICD-10-CM | POA: Diagnosis not present

## 2019-09-04 DIAGNOSIS — J440 Chronic obstructive pulmonary disease with acute lower respiratory infection: Secondary | ICD-10-CM | POA: Diagnosis not present

## 2019-09-04 DIAGNOSIS — J9611 Chronic respiratory failure with hypoxia: Secondary | ICD-10-CM | POA: Diagnosis not present

## 2019-09-04 DIAGNOSIS — R0602 Shortness of breath: Secondary | ICD-10-CM | POA: Diagnosis not present

## 2019-09-04 DIAGNOSIS — J449 Chronic obstructive pulmonary disease, unspecified: Secondary | ICD-10-CM | POA: Diagnosis not present

## 2019-09-13 ENCOUNTER — Other Ambulatory Visit: Payer: Self-pay

## 2019-09-13 DIAGNOSIS — Z20822 Contact with and (suspected) exposure to covid-19: Secondary | ICD-10-CM

## 2019-09-14 LAB — NOVEL CORONAVIRUS, NAA: SARS-CoV-2, NAA: NOT DETECTED

## 2019-09-21 ENCOUNTER — Ambulatory Visit: Payer: Medicare Other | Admitting: Internal Medicine

## 2019-10-02 DIAGNOSIS — J209 Acute bronchitis, unspecified: Secondary | ICD-10-CM | POA: Diagnosis not present

## 2019-10-02 DIAGNOSIS — J9611 Chronic respiratory failure with hypoxia: Secondary | ICD-10-CM | POA: Diagnosis not present

## 2019-10-02 DIAGNOSIS — J449 Chronic obstructive pulmonary disease, unspecified: Secondary | ICD-10-CM | POA: Diagnosis not present

## 2019-10-02 DIAGNOSIS — G4733 Obstructive sleep apnea (adult) (pediatric): Secondary | ICD-10-CM | POA: Diagnosis not present

## 2019-10-02 DIAGNOSIS — R0602 Shortness of breath: Secondary | ICD-10-CM | POA: Diagnosis not present

## 2019-10-02 DIAGNOSIS — J440 Chronic obstructive pulmonary disease with acute lower respiratory infection: Secondary | ICD-10-CM | POA: Diagnosis not present

## 2019-10-23 DIAGNOSIS — K589 Irritable bowel syndrome without diarrhea: Secondary | ICD-10-CM | POA: Diagnosis not present

## 2019-10-23 DIAGNOSIS — R350 Frequency of micturition: Secondary | ICD-10-CM | POA: Diagnosis not present

## 2019-10-23 DIAGNOSIS — C069 Malignant neoplasm of mouth, unspecified: Secondary | ICD-10-CM | POA: Insufficient documentation

## 2019-10-23 DIAGNOSIS — K635 Polyp of colon: Secondary | ICD-10-CM | POA: Insufficient documentation

## 2019-10-23 DIAGNOSIS — K625 Hemorrhage of anus and rectum: Secondary | ICD-10-CM | POA: Diagnosis not present

## 2019-11-02 DIAGNOSIS — J440 Chronic obstructive pulmonary disease with acute lower respiratory infection: Secondary | ICD-10-CM | POA: Diagnosis not present

## 2019-11-02 DIAGNOSIS — J449 Chronic obstructive pulmonary disease, unspecified: Secondary | ICD-10-CM | POA: Diagnosis not present

## 2019-11-02 DIAGNOSIS — G4733 Obstructive sleep apnea (adult) (pediatric): Secondary | ICD-10-CM | POA: Diagnosis not present

## 2019-11-02 DIAGNOSIS — R0602 Shortness of breath: Secondary | ICD-10-CM | POA: Diagnosis not present

## 2019-11-02 DIAGNOSIS — J9611 Chronic respiratory failure with hypoxia: Secondary | ICD-10-CM | POA: Diagnosis not present

## 2019-11-02 DIAGNOSIS — J209 Acute bronchitis, unspecified: Secondary | ICD-10-CM | POA: Diagnosis not present

## 2019-11-06 DIAGNOSIS — N390 Urinary tract infection, site not specified: Secondary | ICD-10-CM | POA: Diagnosis not present

## 2019-11-06 DIAGNOSIS — Z1231 Encounter for screening mammogram for malignant neoplasm of breast: Secondary | ICD-10-CM | POA: Diagnosis not present

## 2019-11-15 DIAGNOSIS — Z1159 Encounter for screening for other viral diseases: Secondary | ICD-10-CM | POA: Diagnosis not present

## 2019-11-17 ENCOUNTER — Other Ambulatory Visit: Payer: Self-pay

## 2019-11-17 DIAGNOSIS — Z20822 Contact with and (suspected) exposure to covid-19: Secondary | ICD-10-CM

## 2019-11-18 LAB — SARS-COV-2, NAA 2 DAY TAT

## 2019-11-18 LAB — NOVEL CORONAVIRUS, NAA: SARS-CoV-2, NAA: NOT DETECTED

## 2019-11-20 DIAGNOSIS — K5730 Diverticulosis of large intestine without perforation or abscess without bleeding: Secondary | ICD-10-CM | POA: Diagnosis not present

## 2019-11-20 DIAGNOSIS — K6389 Other specified diseases of intestine: Secondary | ICD-10-CM | POA: Diagnosis not present

## 2019-11-20 DIAGNOSIS — K640 First degree hemorrhoids: Secondary | ICD-10-CM | POA: Diagnosis not present

## 2019-11-20 DIAGNOSIS — Z86010 Personal history of colonic polyps: Secondary | ICD-10-CM | POA: Diagnosis not present

## 2019-11-20 DIAGNOSIS — R194 Change in bowel habit: Secondary | ICD-10-CM | POA: Diagnosis not present

## 2019-11-22 DIAGNOSIS — K6389 Other specified diseases of intestine: Secondary | ICD-10-CM | POA: Diagnosis not present

## 2019-11-22 DIAGNOSIS — R194 Change in bowel habit: Secondary | ICD-10-CM | POA: Diagnosis not present

## 2019-11-29 DIAGNOSIS — R21 Rash and other nonspecific skin eruption: Secondary | ICD-10-CM | POA: Diagnosis not present

## 2019-11-29 DIAGNOSIS — E119 Type 2 diabetes mellitus without complications: Secondary | ICD-10-CM | POA: Diagnosis not present

## 2019-12-02 DIAGNOSIS — J9611 Chronic respiratory failure with hypoxia: Secondary | ICD-10-CM | POA: Diagnosis not present

## 2019-12-02 DIAGNOSIS — G4733 Obstructive sleep apnea (adult) (pediatric): Secondary | ICD-10-CM | POA: Diagnosis not present

## 2019-12-02 DIAGNOSIS — J209 Acute bronchitis, unspecified: Secondary | ICD-10-CM | POA: Diagnosis not present

## 2019-12-02 DIAGNOSIS — R0602 Shortness of breath: Secondary | ICD-10-CM | POA: Diagnosis not present

## 2019-12-02 DIAGNOSIS — J440 Chronic obstructive pulmonary disease with acute lower respiratory infection: Secondary | ICD-10-CM | POA: Diagnosis not present

## 2019-12-02 DIAGNOSIS — J449 Chronic obstructive pulmonary disease, unspecified: Secondary | ICD-10-CM | POA: Diagnosis not present

## 2019-12-04 ENCOUNTER — Other Ambulatory Visit: Payer: Self-pay | Admitting: Radiology

## 2019-12-04 DIAGNOSIS — Z20822 Contact with and (suspected) exposure to covid-19: Secondary | ICD-10-CM

## 2019-12-05 LAB — NOVEL CORONAVIRUS, NAA: SARS-CoV-2, NAA: NOT DETECTED

## 2019-12-05 LAB — SARS-COV-2, NAA 2 DAY TAT

## 2019-12-11 DIAGNOSIS — E7800 Pure hypercholesterolemia, unspecified: Secondary | ICD-10-CM | POA: Diagnosis not present

## 2019-12-11 DIAGNOSIS — Z85118 Personal history of other malignant neoplasm of bronchus and lung: Secondary | ICD-10-CM | POA: Diagnosis not present

## 2019-12-11 DIAGNOSIS — E1169 Type 2 diabetes mellitus with other specified complication: Secondary | ICD-10-CM | POA: Diagnosis not present

## 2019-12-27 ENCOUNTER — Other Ambulatory Visit: Payer: Self-pay | Admitting: Internal Medicine

## 2019-12-27 ENCOUNTER — Telehealth: Payer: Self-pay | Admitting: Internal Medicine

## 2019-12-27 DIAGNOSIS — J209 Acute bronchitis, unspecified: Secondary | ICD-10-CM

## 2019-12-27 DIAGNOSIS — J440 Chronic obstructive pulmonary disease with acute lower respiratory infection: Secondary | ICD-10-CM

## 2019-12-27 MED ORDER — ALBUTEROL SULFATE (2.5 MG/3ML) 0.083% IN NEBU: 2.5000 mg | INHALATION_SOLUTION | Freq: Three times a day (TID) | RESPIRATORY_TRACT | 3 refills | Status: DC | PRN

## 2019-12-27 MED ORDER — PROAIR RESPICLICK 108 (90 BASE) MCG/ACT IN AEPB: 2.0000 | INHALATION_SPRAY | RESPIRATORY_TRACT | 3 refills | Status: DC | PRN

## 2019-12-27 NOTE — Telephone Encounter (Signed)
ATC pt, no answer. Left detailed message lettingher know I sent in her prescriptions. Nothing further is needed.

## 2019-12-28 DIAGNOSIS — H40013 Open angle with borderline findings, low risk, bilateral: Secondary | ICD-10-CM | POA: Diagnosis not present

## 2019-12-28 DIAGNOSIS — H43812 Vitreous degeneration, left eye: Secondary | ICD-10-CM | POA: Diagnosis not present

## 2019-12-28 DIAGNOSIS — D3131 Benign neoplasm of right choroid: Secondary | ICD-10-CM | POA: Diagnosis not present

## 2019-12-28 DIAGNOSIS — D3132 Benign neoplasm of left choroid: Secondary | ICD-10-CM | POA: Diagnosis not present

## 2019-12-28 DIAGNOSIS — E119 Type 2 diabetes mellitus without complications: Secondary | ICD-10-CM | POA: Diagnosis not present

## 2019-12-28 DIAGNOSIS — H5203 Hypermetropia, bilateral: Secondary | ICD-10-CM | POA: Diagnosis not present

## 2019-12-30 ENCOUNTER — Other Ambulatory Visit: Payer: Self-pay | Admitting: Internal Medicine

## 2019-12-30 DIAGNOSIS — J209 Acute bronchitis, unspecified: Secondary | ICD-10-CM

## 2020-01-02 DIAGNOSIS — J209 Acute bronchitis, unspecified: Secondary | ICD-10-CM | POA: Diagnosis not present

## 2020-01-02 DIAGNOSIS — G4733 Obstructive sleep apnea (adult) (pediatric): Secondary | ICD-10-CM | POA: Diagnosis not present

## 2020-01-02 DIAGNOSIS — J449 Chronic obstructive pulmonary disease, unspecified: Secondary | ICD-10-CM | POA: Diagnosis not present

## 2020-01-02 DIAGNOSIS — J440 Chronic obstructive pulmonary disease with acute lower respiratory infection: Secondary | ICD-10-CM | POA: Diagnosis not present

## 2020-01-02 DIAGNOSIS — R0602 Shortness of breath: Secondary | ICD-10-CM | POA: Diagnosis not present

## 2020-01-02 DIAGNOSIS — J9611 Chronic respiratory failure with hypoxia: Secondary | ICD-10-CM | POA: Diagnosis not present

## 2020-01-08 ENCOUNTER — Telehealth: Payer: Self-pay | Admitting: Internal Medicine

## 2020-01-08 NOTE — Telephone Encounter (Signed)
Spoke with pt. Advised her that she does not need to do anything prior to her appointment next month. Nothing further was needed.

## 2020-01-11 DIAGNOSIS — R350 Frequency of micturition: Secondary | ICD-10-CM | POA: Diagnosis not present

## 2020-01-29 ENCOUNTER — Other Ambulatory Visit: Payer: Self-pay

## 2020-01-29 ENCOUNTER — Encounter: Payer: Self-pay | Admitting: Internal Medicine

## 2020-01-29 ENCOUNTER — Ambulatory Visit (INDEPENDENT_AMBULATORY_CARE_PROVIDER_SITE_OTHER): Payer: Medicare Other | Admitting: Internal Medicine

## 2020-01-29 DIAGNOSIS — J9611 Chronic respiratory failure with hypoxia: Secondary | ICD-10-CM

## 2020-01-29 DIAGNOSIS — J449 Chronic obstructive pulmonary disease, unspecified: Secondary | ICD-10-CM

## 2020-01-29 DIAGNOSIS — J440 Chronic obstructive pulmonary disease with acute lower respiratory infection: Secondary | ICD-10-CM | POA: Diagnosis not present

## 2020-01-29 DIAGNOSIS — J209 Acute bronchitis, unspecified: Secondary | ICD-10-CM

## 2020-01-29 MED ORDER — FLUTICASONE-SALMETEROL 100-50 MCG/DOSE IN AEPB
INHALATION_SPRAY | RESPIRATORY_TRACT | 12 refills | Status: DC
Start: 2020-01-29 — End: 2020-10-30

## 2020-01-29 MED ORDER — ALBUTEROL SULFATE HFA 108 (90 BASE) MCG/ACT IN AERS: 2.0000 | INHALATION_SPRAY | Freq: Four times a day (QID) | RESPIRATORY_TRACT | 12 refills | Status: DC | PRN

## 2020-01-29 NOTE — Patient Instructions (Addendum)
We are sending script for Advair diskus to replace Anoro    Inhale 1 puff, then rinse mouth, twice daily- maintenance inhaler  We are sending script for ventolin inhaler to replace Proair as your rescue inhaler     Inhale 2 puffs, every 6 hours if needed  See if you still have a Spiriva or Stiolto inhaler. I don't think you are using this anymore, but you can set it aside and see how ou do without it.  We can continue O2 2l for sleep and exertion- glad it helps  Please call if we can help you

## 2020-01-29 NOTE — Progress Notes (Signed)
HPI female former smoker followed for COPD, Chronic Hypoxic Resp Failure, history left lower lobe resection/chemotherapy 4315 NSCCa, complicated by DM, obesity/lap band surgery PFT 07/05/2008-moderate obstructive airways disease with response to bronchodilator, diffusion moderately reduced. FEV1 1.75/87%, FEV1/FVC 0.53, DLCO 49 Office Spirometry 07/04/2015-mild airway obstruction. FEV1/FVC 0.55 Office Spirometry 03/06/2016- mild obstructive airways disease-FVC 2.01/104%, FEV1 1.16/78%, ratio 0.58, FEF 25-75% 0.61/ 43% NPSG 07/03/13- AHI 5.3/ hr, weight 234 pounds PFT 05/03/17-mild obstructive airways disease, no response to bronchodilator.  Diffusion moderately reduced.  FEV1/FVC 0.59, DLCO 59%  --------------------------------------------------------------------------------------------------------------   03/24/2019- 63  female former smoker followed for COPD, Chronic Hypoxic Resp Failure, history left lower lobe resection/chemotherapy 4008 NSCCa, complicated by DM 2, obesity / lap band surgery O2 2L/ [employer] pt states her breathing is at baseline with O2 2L continuous Seen by NP 8/11> started home O2, changed Bevespi to [employer], ordered PFT-pending Asks POC for flexibility- discussed in detail. On 30 day course of prednisone 2.5 mg for Abnl chem- renal function CXR 02/28/2019-  Postoperative change on the left with areas of scarring. No edema or consolidation. Stable cardiac silhouette. No evident adenopathy.  01/29/20- 64  female former smoker followed for COPD, Chronic Hypoxic Resp Failure, history left lower lobe resection/chemotherapy 6761 NSCCa, complicated by DM 2, obesity / lap band surgery O2 2L/ [employer]   PFT ordered last year- not done due to Covid disruption. Neb albuterol, Anoro, Stiolto Respimat, Proair hfa, prednisone 2.5 mg daily maint for renal function,  -----pt rescue inhaler is not covered by insurance.pt is here to get requalifed for oxygen Feels better with O2 during sleep and  exertion- climbing stairs. Dislikes the powder in Anoro.  Covid vax- 2 Phizer  ROS-see HPI + = positive Constitutional:   No-   weight loss, night sweats, fevers, chills,+ fatigue, lassitude. HEENT:   No-  headaches, +difficulty swallowing, tooth/dental problems, sore throat,       No-  sneezing, itching, ear ache, +nasal congestion, post nasal drip,  CV:  No-   chest pain, orthopnea, PND, swelling in lower extremities, anasarca, dizziness, palpitations Resp: +  shortness of breath with exertion or at rest.        productive cough,  non-productive cough,   coughing up of blood.              No-   change in color of mucus. wheezing.   Skin: No-   rash or lesions. GI:  No-   heartburn, indigestion, abdominal pain, nausea, vomiting, GU: . MS:  No-   joint pain or swelling.   cramping myalgias Neuro-     nothing unusual Psych:  No- change in mood or affect. No depression or anxiety.  No memory loss.  OBJ General- Alert, Oriented, Affect-appropriate, Distress- none acute, +obese Skin- rash-none, lesions- none, excoriation- none.  Lymphadenopathy- none Head- atraumatic            Eyes- Gross vision intact, PERRLA, conjunctivae clear secretions            Ears- Hearing, canals-normal            Nose- Clear, no-Septal dev, mucus, polyps, erosion, perforation             Throat- Mallampati III-IV , mucosa , drainage- none, tonsils- atrophic Neck- flexible , trachea midline, no stridor , thyroid nl, carotid no bruit Chest - symmetrical excursion , unlabored           Heart/CV- RRR , no murmur , no gallop  , no  rub, nl s1 s2                           - JVD- none , edema- none, stasis changes- none, varices- none           Lung- clear, wheeze- none, cough-none , dullness-none, rub- none           Chest wall- scar left chest Port-A-Cath Abd-  Br/ Gen/ Rectal- Not done, not indicated Extrem- cyanosis- none, clubbing, none, atrophy- none, strength- nl Neuro- grossly intact to  observation

## 2020-02-01 DIAGNOSIS — J440 Chronic obstructive pulmonary disease with acute lower respiratory infection: Secondary | ICD-10-CM | POA: Diagnosis not present

## 2020-02-01 DIAGNOSIS — R0602 Shortness of breath: Secondary | ICD-10-CM | POA: Diagnosis not present

## 2020-02-01 DIAGNOSIS — J209 Acute bronchitis, unspecified: Secondary | ICD-10-CM | POA: Diagnosis not present

## 2020-02-01 DIAGNOSIS — J449 Chronic obstructive pulmonary disease, unspecified: Secondary | ICD-10-CM | POA: Diagnosis not present

## 2020-02-01 DIAGNOSIS — G4733 Obstructive sleep apnea (adult) (pediatric): Secondary | ICD-10-CM | POA: Diagnosis not present

## 2020-02-01 DIAGNOSIS — J9611 Chronic respiratory failure with hypoxia: Secondary | ICD-10-CM | POA: Diagnosis not present

## 2020-03-03 DIAGNOSIS — R0602 Shortness of breath: Secondary | ICD-10-CM | POA: Diagnosis not present

## 2020-03-03 DIAGNOSIS — G4733 Obstructive sleep apnea (adult) (pediatric): Secondary | ICD-10-CM | POA: Diagnosis not present

## 2020-03-03 DIAGNOSIS — J440 Chronic obstructive pulmonary disease with acute lower respiratory infection: Secondary | ICD-10-CM | POA: Diagnosis not present

## 2020-03-03 DIAGNOSIS — J9611 Chronic respiratory failure with hypoxia: Secondary | ICD-10-CM | POA: Diagnosis not present

## 2020-03-03 DIAGNOSIS — J449 Chronic obstructive pulmonary disease, unspecified: Secondary | ICD-10-CM | POA: Diagnosis not present

## 2020-03-03 DIAGNOSIS — J209 Acute bronchitis, unspecified: Secondary | ICD-10-CM | POA: Diagnosis not present

## 2020-03-18 ENCOUNTER — Ambulatory Visit: Payer: BC Managed Care – PPO | Admitting: Internal Medicine

## 2020-03-20 DIAGNOSIS — Z85118 Personal history of other malignant neoplasm of bronchus and lung: Secondary | ICD-10-CM | POA: Diagnosis not present

## 2020-03-20 DIAGNOSIS — R350 Frequency of micturition: Secondary | ICD-10-CM | POA: Diagnosis not present

## 2020-03-20 DIAGNOSIS — R600 Localized edema: Secondary | ICD-10-CM | POA: Diagnosis not present

## 2020-03-20 DIAGNOSIS — Z0000 Encounter for general adult medical examination without abnormal findings: Secondary | ICD-10-CM | POA: Diagnosis not present

## 2020-03-20 DIAGNOSIS — Z1389 Encounter for screening for other disorder: Secondary | ICD-10-CM | POA: Diagnosis not present

## 2020-03-20 DIAGNOSIS — E1169 Type 2 diabetes mellitus with other specified complication: Secondary | ICD-10-CM | POA: Diagnosis not present

## 2020-03-29 DIAGNOSIS — K5900 Constipation, unspecified: Secondary | ICD-10-CM | POA: Diagnosis not present

## 2020-03-29 DIAGNOSIS — K648 Other hemorrhoids: Secondary | ICD-10-CM | POA: Diagnosis not present

## 2020-04-01 ENCOUNTER — Ambulatory Visit: Payer: Self-pay | Admitting: Surgery

## 2020-04-01 DIAGNOSIS — K641 Second degree hemorrhoids: Secondary | ICD-10-CM | POA: Diagnosis not present

## 2020-04-01 DIAGNOSIS — D171 Benign lipomatous neoplasm of skin and subcutaneous tissue of trunk: Secondary | ICD-10-CM | POA: Diagnosis not present

## 2020-04-01 DIAGNOSIS — K430 Incisional hernia with obstruction, without gangrene: Secondary | ICD-10-CM | POA: Diagnosis not present

## 2020-04-01 DIAGNOSIS — K581 Irritable bowel syndrome with constipation: Secondary | ICD-10-CM | POA: Diagnosis not present

## 2020-04-01 DIAGNOSIS — K648 Other hemorrhoids: Secondary | ICD-10-CM | POA: Diagnosis not present

## 2020-04-03 DIAGNOSIS — J449 Chronic obstructive pulmonary disease, unspecified: Secondary | ICD-10-CM | POA: Diagnosis not present

## 2020-04-03 DIAGNOSIS — J440 Chronic obstructive pulmonary disease with acute lower respiratory infection: Secondary | ICD-10-CM | POA: Diagnosis not present

## 2020-04-03 DIAGNOSIS — G4733 Obstructive sleep apnea (adult) (pediatric): Secondary | ICD-10-CM | POA: Diagnosis not present

## 2020-04-03 DIAGNOSIS — J209 Acute bronchitis, unspecified: Secondary | ICD-10-CM | POA: Diagnosis not present

## 2020-04-03 DIAGNOSIS — R0602 Shortness of breath: Secondary | ICD-10-CM | POA: Diagnosis not present

## 2020-04-04 ENCOUNTER — Encounter: Payer: Self-pay | Admitting: Internal Medicine

## 2020-04-04 NOTE — Assessment & Plan Note (Signed)
No exacerbation. Has had Covid vax w/o Covid infection. Plan- change Anoro to Advair

## 2020-04-04 NOTE — Assessment & Plan Note (Signed)
Benefits and dependent on O2 Plan - continue O2 2L sleep and exertion

## 2020-04-05 ENCOUNTER — Encounter: Payer: Self-pay | Admitting: Internal Medicine

## 2020-04-15 ENCOUNTER — Ambulatory Visit (INDEPENDENT_AMBULATORY_CARE_PROVIDER_SITE_OTHER): Payer: Medicare Other | Admitting: Orthopaedic Surgery

## 2020-04-15 ENCOUNTER — Ambulatory Visit: Payer: Self-pay

## 2020-04-15 ENCOUNTER — Encounter: Payer: Self-pay | Admitting: Physician Assistant

## 2020-04-15 DIAGNOSIS — M1711 Unilateral primary osteoarthritis, right knee: Secondary | ICD-10-CM | POA: Diagnosis not present

## 2020-04-15 DIAGNOSIS — M1712 Unilateral primary osteoarthritis, left knee: Secondary | ICD-10-CM | POA: Diagnosis not present

## 2020-04-15 DIAGNOSIS — M170 Bilateral primary osteoarthritis of knee: Secondary | ICD-10-CM

## 2020-04-15 MED ORDER — TRAMADOL HCL 50 MG PO TABS: 50.0000 mg | ORAL_TABLET | Freq: Two times a day (BID) | ORAL | 1 refills | Status: DC | PRN

## 2020-04-15 NOTE — Progress Notes (Signed)
Office Visit Note   Patient: [name]           Date of Birth: Jun 25, 1946           MRN: [id number] Visit Date: 04/15/2020              Requested by: Seward Carol, MD [address] [place],  [place] [address] PCP: Seward Carol, MD   Assessment & Plan: Visit Diagnoses:  1. Bilateral primary osteoarthritis of knee     Plan: Impression is bilateral knee osteoarthritis flareup.  We discussed cortisone injection to both knees, but the patient has had these of the shoulders in the past which caused significant pain.  She would like to try a course of tramadol for now.  Should her symptoms worsen she will come back in for cortisone injections.  Follow-Up Instructions: Return if symptoms worsen or fail to improve.   Orders:  Orders Placed This Encounter  Procedures  . XR KNEE 3 VIEW RIGHT  . XR KNEE 3 VIEW LEFT   Meds ordered this encounter  Medications  . traMADol (ULTRAM) 50 MG tablet    Sig: Take 1 tablet (50 mg total) by mouth 2 (two) times daily as needed.    Dispense:  30 tablet    Refill:  1      Procedures: No procedures performed   Clinical Data: No additional findings.   Subjective: Chief Complaint  Patient presents with  . Left Knee - Pain  . Right Knee - Pain    HPI patient is a 74 year old female who presents to our clinic today with bilateral knee pain both equally as bad.  The pain she has is to the entire aspect of both knees.  She has had this for the past month without any known injury or change in activity.  She has increased pain when she is twisting her knees in the better when she is squatting.  She has tried Tylenol, naproxen, Voltaren gel and Aspercreme without any significant relief of symptoms.  She does note that she has to use these sparingly as she has a history of gastric bypass surgery.  No previous cortisone injection to either knee.  Review of Systems as detailed in HPI.  All others reviewed and are  negative.   Objective: Vital Signs: There were no vitals taken for this visit.  Physical Exam well-developed well-nourished female no acute distress.  Alert and oriented x3.  Ortho Exam examination of both knees shows a trace effusion on the right.  No effusion on the left.  Range of motion 0 to 115 degrees.  Moderate bilateral patellofemoral crepitus.  Lateral joint line tenderness on the right medial joint line tenderness on the left.  Ligaments are stable.  She is neurovascular intact distally.  Specialty Comments:  No specialty comments available.  Imaging: XR KNEE 3 VIEW LEFT  Result Date: 04/15/2020 X-rays demonstrate moderate medial patellofemoral degenerative changes  XR KNEE 3 VIEW RIGHT  Result Date: 04/15/2020 X-rays demonstrate moderate medial patellofemoral degenerative changes    PMFS History: Patient Active Problem List   Diagnosis Date Noted  . Chronic respiratory failure with hypoxia ([place]) 04/01/2019  . Medication management 02/28/2019  . Dysphagia 12/30/2017  . Encounter for preoperative examination for general surgical procedure 03/06/2016  . Cough 08/02/2015  . Obstructive sleep apnea 08/06/2013  . COPD with acute bronchitis ([place]) 06/18/2013  . Myalgia 06/18/2013  . History of laparoscopic adjustable gastric banding, APS. 09/04/2008. 04/08/2011  .  History of lung cancer 06/27/2010  . SKIN RASH 12/13/2009  . DYSPNEA 07/05/2008  . DIABETES, TYPE 2 07/01/2008  . Obesity, morbid ([place]) 07/01/2008  . COPD mixed type ([place]) 06/26/2008   Past Medical History:  Diagnosis Date  . Abdominal pain    around naval  . Arthritis   . Breast pain in female   . Bruises easily   . Cancer ([place]) 2003   lung  . Cervical disc disease   . Chronic depression   . COPD (chronic obstructive pulmonary disease) ([place])   . Diabetes mellitus   . DJD (degenerative joint disease)   . Gum disease   . Hernia   . Insomnia   . Phlebitis   . Thyroid disease   . Wears glasses    . Weight increase     Family History  Problem Relation Age of Onset  . Stroke Mother   . Stroke Father     Past Surgical History:  Procedure Laterality Date  . ABDOMINAL HYSTERECTOMY     partial  . FOOT SURGERY     right  . LAPAROSCOPIC GASTRIC BANDING  Feb 2010  . LEFT HEART CATHETERIZATION WITH CORONARY ANGIOGRAM N/A 05/02/2012   Procedure: LEFT HEART CATHETERIZATION WITH CORONARY ANGIOGRAM;  Surgeon: Laverda Page, MD;  Location: [facility] CATH LAB;  Service: Cardiovascular;  Laterality: N/A;  . LUNG LOBECTOMY  2003   left  . SPINE SURGERY     fusion on the neck  . THYROIDECTOMY     partial  . TONSILLECTOMY     Social History   Occupational History  . Not on file  Tobacco Use  . Smoking status: Former Smoker    Packs/day: 2.00    Years: 37.00    Pack years: 74.00    Types: Cigarettes    Quit date: 06/21/2002    Years since quitting: 17.8  . Smokeless tobacco: Never Used  Substance and Sexual Activity  . Alcohol use: No  . Drug use: No  . Sexual activity: Not on file

## 2020-04-19 ENCOUNTER — Other Ambulatory Visit: Payer: BC Managed Care – PPO

## 2020-04-19 DIAGNOSIS — Z20822 Contact with and (suspected) exposure to covid-19: Secondary | ICD-10-CM

## 2020-04-20 LAB — SARS-COV-2, NAA 2 DAY TAT

## 2020-04-20 LAB — NOVEL CORONAVIRUS, NAA: SARS-CoV-2, NAA: NOT DETECTED

## 2020-05-02 ENCOUNTER — Other Ambulatory Visit: Payer: BC Managed Care – PPO

## 2020-05-02 DIAGNOSIS — Z20822 Contact with and (suspected) exposure to covid-19: Secondary | ICD-10-CM

## 2020-05-03 LAB — NOVEL CORONAVIRUS, NAA: SARS-CoV-2, NAA: NOT DETECTED

## 2020-05-03 LAB — SARS-COV-2, NAA 2 DAY TAT

## 2020-05-09 DIAGNOSIS — K430 Incisional hernia with obstruction, without gangrene: Secondary | ICD-10-CM | POA: Diagnosis not present

## 2020-05-09 DIAGNOSIS — E162 Hypoglycemia, unspecified: Secondary | ICD-10-CM | POA: Diagnosis not present

## 2020-05-09 DIAGNOSIS — Z85118 Personal history of other malignant neoplasm of bronchus and lung: Secondary | ICD-10-CM | POA: Diagnosis not present

## 2020-05-30 ENCOUNTER — Ambulatory Visit: Payer: BC Managed Care – PPO | Admitting: Dietician

## 2020-05-30 NOTE — Patient Instructions (Addendum)
DUE TO COVID-19 ONLY ONE VISITOR IS ALLOWED TO COME WITH YOU AND STAY IN THE WAITING ROOM ONLY DURING PRE OP AND PROCEDURE DAY OF SURGERY. THE 1 VISITOR  MAY VISIT WITH YOU AFTER SURGERY IN YOUR PRIVATE ROOM DURING VISITING HOURS ONLY!  YOU NEED TO HAVE A COVID 19 TEST ON__11/15_____ @_9 :30 AM______, THIS TEST MUST BE DONE BEFORE SURGERY,  COVID TESTING SITE [place] [address], IT IS ON THE RIGHT GOING OUT [address] APPROXIMATELY  2 MINUTES PAST [employer] ON THE RIGHT. ONCE YOUR COVID TEST IS COMPLETED,  PLEASE BEGIN THE QUARANTINE INSTRUCTIONS AS OUTLINED IN YOUR HANDOUT.                [name]    Your procedure is scheduled on: 06/06/20   Report to [facility] Main  Entrance   Report to admitting at   11:15 AM     Call this number if you have problems the morning of surgery [phone]    Remember: Do not eat food or drink liquids :After Midnight.   BRUSH YOUR TEETH MORNING OF SURGERY AND RINSE YOUR MOUTH OUT, NO CHEWING GUM CANDY OR MINTS.     Take these medicines the morning of surgery with A SIP OF WATER: Use your inhaler and bring with you. O2 if you need it  DO NOT TAKE ANY DIABETIC MEDICATIONS DAY OF YOUR SURGERY                               You may not have any metal on your body including hair pins and              piercings  Do not wear jewelry, make-up, lotions, powders or perfumes, deodorant             Do not wear nail polish on your fingernails.  Do not shave  48 hours prior to surgery.            Do not bring valuables to the hospital. [place].  Contacts, dentures or bridgework may not be worn into surgery.      Patients discharged the day of surgery will not be allowed to drive home.   IF YOU ARE HAVING SURGERY AND GOING HOME THE SAME DAY, YOU MUST HAVE AN ADULT TO DRIVE YOU HOME AND BE WITH YOU FOR 24 HOURS.   YOU MAY GO HOME BY TAXI OR UBER  OR ORTHERWISE, BUT AN ADULT MUST ACCOMPANY YOU HOME AND STAY WITH YOU FOR 24 HOURS.  Name and phone number of your driver:  Special Instructions: N/A              Please read over the following fact sheets you were given: _____________________________________________________________________             [facility] - Preparing for Surgery Before surgery, you can play an important role.   Because skin is not sterile, your skin needs to be as free of germs as possible.   You can reduce the number of germs on your skin by washing with CHG (chlorahexidine gluconate) soap before surgery.   CHG is an antiseptic cleaner which kills germs and bonds with the skin to continue killing germs even after washing. Please DO NOT use if you have an allergy  to CHG or antibacterial soaps.   If your skin becomes reddened/irritated stop using the CHG and inform your nurse when you arrive at Short Stay. Do not shave (including legs and underarms) for at least 48 hours prior to the first CHG shower.   Please follow these instructions carefully:  1.  Shower with CHG Soap the night before surgery and the  morning of Surgery.  2.  If you choose to wash your hair, wash your hair first as usual with your  normal  shampoo.  3.  After you shampoo, rinse your hair and body thoroughly to remove the  shampoo.                                        4.  Use CHG as you would any other liquid soap.  You can apply chg directly  to the skin and wash                       Gently with a scrungie or clean washcloth.  5.  Apply the CHG Soap to your body ONLY FROM THE NECK DOWN.   Do not use on face/ open                           Wound or open sores. Avoid contact with eyes, ears mouth and genitals (private parts).                       Wash face,  Genitals (private parts) with your normal soap.             6.  Wash thoroughly, paying special attention to the area where your surgery  will be performed.  7.  Thoroughly rinse your  body with warm water from the neck down.  8.  DO NOT shower/wash with your normal soap after using and rinsing off  the CHG Soap.             9.  Pat yourself dry with a clean towel.            10.  Wear clean pajamas.            11.  Place clean sheets on your bed the night of your first shower and do not  sleep with pets. Day of Surgery : Do not apply any lotions/deodorants the morning of surgery.  Please wear clean clothes to the hospital/surgery center.  FAILURE TO FOLLOW THESE INSTRUCTIONS MAY RESULT IN THE CANCELLATION OF YOUR SURGERY PATIENT SIGNATURE_________________________________  NURSE SIGNATURE__________________________________  ________________________________________________________________________

## 2020-05-31 ENCOUNTER — Other Ambulatory Visit: Payer: Self-pay

## 2020-05-31 ENCOUNTER — Encounter (HOSPITAL_COMMUNITY)
Admission: RE | Admit: 2020-05-31 | Discharge: 2020-05-31 | Disposition: A | Discharge status: Home / Self Care | Payer: Medicare Other | Source: Ambulatory Visit | Attending: Surgery | Admitting: Surgery

## 2020-05-31 ENCOUNTER — Encounter (HOSPITAL_COMMUNITY): Payer: Self-pay

## 2020-05-31 ENCOUNTER — Ambulatory Visit: Payer: Self-pay | Admitting: Surgery

## 2020-05-31 DIAGNOSIS — E118 Type 2 diabetes mellitus with unspecified complications: Secondary | ICD-10-CM | POA: Diagnosis not present

## 2020-05-31 DIAGNOSIS — Z01818 Encounter for other preprocedural examination: Secondary | ICD-10-CM | POA: Insufficient documentation

## 2020-05-31 HISTORY — DX: Dependence on supplemental oxygen: Z99.81

## 2020-05-31 HISTORY — DX: Dyspnea, unspecified: R06.00

## 2020-05-31 HISTORY — DX: Prediabetes: R73.03

## 2020-05-31 HISTORY — DX: Hypothyroidism, unspecified: E03.9

## 2020-05-31 LAB — BASIC METABOLIC PANEL
Anion gap: 6 (ref 5–15)
BUN: 15 mg/dL (ref 8–23)
CO2: 27 mmol/L (ref 22–32)
Calcium: 8.9 mg/dL (ref 8.9–10.3)
Chloride: 106 mmol/L (ref 98–111)
Creatinine, Ser: 1.07 mg/dL — ABNORMAL HIGH (ref 0.44–1.00)
GFR, Estimated: 55 mL/min — ABNORMAL LOW (ref >60)
Glucose, Bld: 123 mg/dL — ABNORMAL HIGH (ref 70–99)
Potassium: 4.6 mmol/L (ref 3.5–5.1)
Sodium: 139 mmol/L (ref 135–145)

## 2020-05-31 LAB — CBC
HCT: 49.5 % — ABNORMAL HIGH (ref 36.0–46.0)
Hemoglobin: 14.8 g/dL (ref 12.0–15.0)
MCH: 25.6 pg — ABNORMAL LOW (ref 26.0–34.0)
MCHC: 29.9 g/dL — ABNORMAL LOW (ref 30.0–36.0)
MCV: 85.5 fL (ref 80.0–100.0)
Platelets: 210 10*3/uL (ref 150–400)
RBC: 5.79 MIL/uL — ABNORMAL HIGH (ref 3.87–5.11)
RDW: 16.7 % — ABNORMAL HIGH (ref 11.5–15.5)
WBC: 5.8 10*3/uL (ref 4.0–10.5)
nRBC: 0 % (ref 0.0–0.2)

## 2020-05-31 LAB — GLUCOSE, CAPILLARY: Glucose-Capillary: 142 mg/dL — ABNORMAL HIGH (ref 70–99)

## 2020-05-31 LAB — HEMOGLOBIN A1C
Hgb A1c MFr Bld: 6.4 % — ABNORMAL HIGH (ref 4.8–5.6)
Mean Plasma Glucose: 136.98 mg/dL

## 2020-05-31 NOTE — H&P (View-Only) (Signed)
[name] Appointment: 04/01/2020 10:15 AM Location: [facility] Patient #: [id number] DOB: 1945-08-29 Single / Language: [name] / Race: Black or African American Female   History of Present Illness Adrienne Hector MD; 04/01/2020 1:38 PM) The patient is a 74 year old female who presents with hemorrhoids. Note for "Hemorrhoids": ` ` ` Patient sent for surgical consultation at the request of [name]  Chief Complaint: Hemorrhoids, enlarging right buttock mass, incisional hernia. ` ` The patient is a pleasant obese woman. My partner, [name], had placed a laparoscopic gastric band and her over 10 years ago in 2010. Lost to follow-up. She went to carry [place] and ended up having her band removed and switched to gastric bypass in 2018. She has had some pain and discomfort in her abdomen when she has hernia. She also noted a lump on her right buttock that has become more painful and bothersome the past few months. Mitchell her primary care physician. [name] to be probably a lipoma. Surgical consultation offered.  Her main complaint today is that she has a lot of anal pain that she thinks is related to her hemorrhoids. She tends to have issues of constipation and is followed by Adrienne. Michail Mitchell with [facility]. Takes LinZess to move her bowels most days. Mineral at night. Had a colonoscopy this year that was underwhelming. She is to trouble severe constipation moving her bowels once a week with a lot of straining. She's had hemorrhoids for many years. Urinal her bowels are better, she is still struggling with bleeding and irritation. Burning when she moves. Uncomfortable to sit. Careful sharp pain. History of COPD on oxygen intermittently. Denies any cardiac or stroke issues. She is not on blood thinners. She no longer smokes. She has not seen anyone for her bariatric surgery in the past 3 years. She has lost some weight of the gastric  bypass but then had issues of with hypoglycemia and adjusted her diet back to a more regular type diet and is gaining some weight back. [facility]. She has a history of lung cancer and is a survivor.  (Review of systems as stated in this history (HPI) or in the review of systems. Otherwise all other 12 point ROS are negative) ` ` ###########################################`  This patient encounter took 60 minutes today to perform the following: obtain history, perform exam, review outside records, interpret tests & imaging, counsel the patient on their diagnosis; and, document this encounter, including findings & plan in the electronic health record (EHR).   Past Surgical History [name], [place]; 04/01/2020 9:52 AM) Colon Polyp Removal - Open  Foot Surgery  Right. Gastric Bypass  Hysterectomy (not due to cancer) - Partial  Lap Band  Lung Surgery  Left. Mammoplasty; Reduction  Bilateral. Oral Surgery  Spinal Surgery - Neck  Thyroid Surgery  Tonsillectomy   Diagnostic Studies History [name], [place]; 04/01/2020 9:51 AM) Colonoscopy  within last year 1-5 years ago Mammogram  within last year 1-3 years ago Pap Smear  1-5 years ago  Allergies [name], RMA; 04/01/2020 9:52 AM) No Known Drug Allergies  [11/15/2014]: Allergies Reconciled   Medication History [name], [place]; 04/01/2020 9:57 AM) Linzess (72MCG Capsule, Oral) Active. Benadryl Allergy (25MG  Capsule, Oral) Active. Biotin (5000MCG Tablet, Oral) Active. Calcium 500/D (500-400MG -UNIT Tablet Chewable, Oral) Active. Nebulizer/Adult Mask Active. ProAir HFA (108 (90 Base)MCG/ACT Aerosol Soln, Inhalation) Active. ZyrTEC Allergy (10MG  Tablet,  Oral) Active. Albuterol (90MCG/ACT Aerosol Soln, Inhalation) Active. FreeStyle InsuLinx System (w/Device Kit,) Active. Medications Reconciled  Social History [name], [place];  04/01/2020 9:52 AM) Alcohol use  Occasional alcohol use. Caffeine use  Carbonated beverages, Coffee, Tea. No drug use  Tobacco use  Former smoker.  Family History [name], [place]; 04/01/2020 9:52 AM) Alcohol Abuse  Family Members In General, Mother. Arthritis  Mother. Cancer  Son. Cerebrovascular Accident  Father, Mother. Depression  Mother. Diabetes Mellitus  Mother. Hypertension  Daughter, Family Members In General, Father, Mother.  Pregnancy / Birth History [name], [place]; 04/01/2020 9:52 AM) Age at menarche  14 years. Age of menopause  46-50 Gravida  3 Length (months) of breastfeeding  3-6 Maternal age  56-20 Para  3  Other Problems [name], [place]; 04/01/2020 9:52 AM) Anxiety Disorder  Arthritis  Back Pain  Chronic Obstructive Lung Disease  Diabetes Mellitus  Emphysema Of Lung  Hemorrhoids  Home Oxygen Use  Lung Cancer  Umbilical Hernia Repair     Review of Systems [name] RMA; 04/01/2020 9:52 AM) General Not Present- Appetite Loss, Chills, Fatigue, Fever, Night Sweats, Weight Gain and Weight Loss. Skin Present- Dryness. Not Present- Change in Wart/Mole, Hives, Jaundice, New Lesions, Non-Healing Wounds, Rash and Ulcer. HEENT Present- Seasonal Allergies and Wears glasses/contact lenses. Not Present- Earache, Hearing Loss, Hoarseness, Nose Bleed, Oral Ulcers, Ringing in the Ears, Sinus Pain, Sore Throat, Visual Disturbances and Yellow Eyes. Respiratory Present- Snoring and Wheezing. Not Present- Bloody sputum, Chronic Cough and Difficulty Breathing. Breast Not Present- Breast Mass, Breast Pain, Nipple Discharge and Skin Changes. Cardiovascular Present- Leg Cramps and Swelling of Extremities. Not Present- Chest Pain, Difficulty Breathing Lying Down, Palpitations, Rapid Heart Rate and Shortness of Breath. Gastrointestinal Present- Change in Bowel Habits, Constipation, Difficulty Swallowing, Excessive gas, Hemorrhoids and Rectal  Pain. Not Present- Abdominal Pain, Bloating, Bloody Stool, Chronic diarrhea, Gets full quickly at meals, Indigestion, Nausea and Vomiting. Female Genitourinary Present- Frequency and Urgency. Not Present- Nocturia, Painful Urination and Pelvic Pain. Musculoskeletal Present- Back Pain, Joint Pain, Joint Stiffness, Muscle Pain, Muscle Weakness and Swelling of Extremities. Neurological Present- Tingling, Tremor, Trouble walking and Weakness. Not Present- Decreased Memory, Fainting, Headaches, Numbness and Seizures. Psychiatric Present- Anxiety, Change in Sleep Pattern, Depression, Fearful and Frequent crying. Not Present- Bipolar. Endocrine Present- Heat Intolerance and New Diabetes. Not Present- Cold Intolerance, Excessive Hunger, Hair Changes and Hot flashes. Hematology Present- Persistent Infections. Not Present- Blood Thinners, Easy Bruising, Excessive bleeding, Gland problems and HIV.  Vitals Lattie Haw Caldwell RMA; 04/01/2020 9:57 AM) 04/01/2020 9:57 AM Weight: 221.25 lb Height: 60in Body Surface Area: 1.95 m Body Mass Index: 43.21 kg/m  Temp.: 98.56F  Pulse: 81 (Regular)  P.OX: 97% (Room air) BP: 118/78(Sitting, Left Arm, Standard)       Physical Exam Adrienne Hector MD; 04/01/2020 10:56 AM) General Mental Status-Alert. General Appearance-Not in acute distress, Not Sickly. Orientation-Oriented X3. Hydration-Well hydrated. Voice-Normal.  Integumentary Global Assessment Upon inspection and palpation of skin surfaces of the - Axillae: non-tender, no inflammation or ulceration, no drainage. and Distribution of scalp and body hair is normal. General Characteristics Temperature - normal warmth is noted.  Head and Neck Head-normocephalic, atraumatic with no lesions or palpable masses. Face Global Assessment - atraumatic, no absence of expression. Neck Global Assessment - no abnormal movements, no bruit auscultated on the right, no bruit auscultated on the left,  no decreased range of motion, non-tender. Trachea-midline. Thyroid Gland Characteristics - non-tender.  Eye Eyeball - Left-Extraocular movements intact, No  Nystagmus - Left. Eyeball - Right-Extraocular movements intact, No Nystagmus - Right. Cornea - Left-No Hazy - Left. Cornea - Right-No Hazy - Right. Sclera/Conjunctiva - Left-No scleral icterus, No Discharge - Left. Sclera/Conjunctiva - Right-No scleral icterus, No Discharge - Right. Pupil - Left-Direct reaction to light normal. Pupil - Right-Direct reaction to light normal.  ENMT Ears Pinna - Left - no drainage observed, no generalized tenderness observed. Pinna - Right - no drainage observed, no generalized tenderness observed. Nose and Sinuses External Inspection of the Nose - no destructive lesion observed. Inspection of the nares - Left - quiet respiration. Inspection of the nares - Right - quiet respiration. Mouth and Throat Lips - Upper Lip - no fissures observed, no pallor noted. Lower Lip - no fissures observed, no pallor noted. Nasopharynx - no discharge present. Oral Cavity/Oropharynx - Tongue - no dryness observed. Oral Mucosa - no cyanosis observed. Hypopharynx - no evidence of airway distress observed.  Chest and Lung Exam Inspection Movements - Normal and Symmetrical. Accessory muscles - No use of accessory muscles in breathing. Palpation Palpation of the chest reveals - Non-tender. Auscultation Breath sounds - Normal and Clear.  Cardiovascular Auscultation Rhythm - Regular. Murmurs & Other Heart Sounds - Auscultation of the heart reveals - No Murmurs and No Systolic Clicks.  Abdomen Inspection Inspection of the abdomen reveals - No Visible peristalsis and No Abnormal pulsations. Umbilicus - No Bleeding, No Urine drainage. Palpation/Percussion Palpation and Percussion of the abdomen reveal - Soft, Non Tender, No Rebound tenderness, No Rigidity (guarding) and No Cutaneous hyperesthesia. Note:  Abdomen obese but soft. Periumbilical bulging 6 cm mass partially reducible down to a smaller defect consistent with a periumbilical hernia. No diastasis recti. No umbilical or other anterior abdominal wall hernias   Female Genitourinary Sexual Maturity Tanner 5 - Adult hair pattern. Note: No vaginal bleeding nor discharge. No inguinal hernias. No inguinal lymphadenopathy   Rectal Note: Right posterior buttock 6 cm subcutaneous mass overlying initial tuberosity. Consistent with lipoma. Sensitive. Not infected or inflamed. Small skin tag on right inner buttock 1 cm consistent with acrochordon.  Perirectal skin with minimal irritation. No fissure or fistula or abscess. Tolerates digital and anoscopic exam but sensitive. Grade 2 inflamed hemorrhoids right posterior greater than left lateral. Right anterior grade 1. Old stool in vault. I feel no other masses or tumors to 5 cm.  After long discussion I offered banding help in trying controlled the hemorrhoids. I placed 3 bands with good purchase on right posterior & left lateral   Peripheral Vascular Upper Extremity Inspection - Left - No Cyanotic nailbeds - Left, Not Ischemic. Inspection - Right - No Cyanotic nailbeds - Right, Not Ischemic.  Neurologic Neurologic evaluation reveals -normal attention span and ability to concentrate, able to name objects and repeat phrases. Appropriate fund of knowledge , normal sensation and normal coordination. Mental Status Affect - not angry, not paranoid. Cranial Nerves-Normal Bilaterally. Gait-Normal.  Neuropsychiatric Mental status exam performed with findings of-able to articulate well with normal speech/language, rate, volume and coherence, thought content normal with ability to perform basic computations and apply abstract reasoning and no evidence of hallucinations, delusions, obsessions or homicidal/suicidal ideation.  Musculoskeletal Global Assessment Spine, Ribs and Pelvis - no  instability, subluxation or laxity. Right Upper Extremity - no instability, subluxation or laxity.  Lymphatic Head & Neck  General Head & Neck Lymphatics: Bilateral - Description - No Localized lymphadenopathy. Axillary  General Axillary Region: Bilateral - Description - No Localized lymphadenopathy. Femoral & Inguinal  Generalized Femoral & Inguinal Lymphatics: Left - Description - No Localized lymphadenopathy. Right - Description - No Localized lymphadenopathy.   Results Adrienne Hector MD; 04/01/2020 1:45 PM) Procedures  Name Value Date Hemorrhoids Procedure Internal exam: Internal Hemorroids ( non-bleeding) Internal Hemorrhoids (bleeding) *Procedure: banded Other: Perirectal skin with minimal irritation. No fissure or fistula or abscess. No condyloma. No pilonidal disease. Tolerates digital and anoscopic exam but sensitive. Grade 2 inflamed hemorrhoids right posterior greater than left lateral. Right anterior grade 1. Old stool in vault. I feel no other masses or tumors to 5 cm. ......  ....Marland KitchenMarland Kitchen After long discussion I offered banding help in trying controlled the hemorrhoids. I placed 3 bands with good purchase on right posterior & left lateral piles. She tolerated well.  Performed: 04/01/2020 10:57 AM    Assessment & Plan Adrienne Hector MD; 04/01/2020 1:42 PM) PROLAPSED INTERNAL HEMORRHOIDS, GRADE 2 (K64.1) Impression: The anatomy & physiology of the anorectal region was discussed. The pathophysiology of hemorrhoids and differential diagnosis was discussed. Natural history progression was discussed. I stressed the importance of a bowel regimen to have daily soft bowel movements to minimize progression of disease. Goal of one BM / day ideal. Use of wet wipes, warm baths, avoiding straining, etc were emphasized.  Educational handouts further explaining the pathology, treatment options, and bowel regimen were given as well. The patient expressed  understanding. Current Plans HEMORRHOIDECTOMY, INTERNAL, RUBBER BAND LIGATION (32023) The anatomy & physiology of the anorectal region was discussed. The pathophysiology of hemorrhoids and differential diagnosis was discussed. Natural history progression was discussed. I stressed the importance of a bowel regimen to have daily soft bowel movements to minimize progression of disease.  The patient's symptoms are not adequately controlled. Therefore, I recommended banding to treat the hemorrhoids. I went over the technique, risks, benefits, and alternatives. Goals of post-operative recovery were discussed as well. Questions were answered. The patient expressed understanding & wished to proceed.  The patient was positioned in the lateral decubitus position. Perianal & rectal examination was done. Using anoscopy, I ligated the hemorrhoids above the dentate line with banding. 3 bands applied. One good on right posterior. Another one with good purchase on left lateral. The patient tolerated the procedure well. Educational handouts further explaining the pathology, treatment options, and bowel regimen were given as well.  Pt Education - [facility] Hemorrhoids (Jacquise Rarick): discussed with patient and provided information. Pt Education - Pamphlet Given - The Hemorrhoid Book: discussed with patient and provided information. 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  It would be technically feasible due to laparoscopic underlay repair with mesh. Obesity makes recurrence risk increased. However it is incarcerated and is causing some discomfort. Seem she gets over the hemorrhoids first the can consider that and may be dated same time as a lipoma surgery if she wishes to be aggressive.  I would like  pulmonary clearance given her history of COPD with occasional oxygen dependence. She usually sees Adrienne. Annamaria Boots.  Noted to be helpful for her to be followed with her weight issues. She does not want to go back to the [facility] or [place]. We can try and help reestablish with [place] again. See if [name] or his partners can help get her back into the bariatric program for some nutritional education. Current Plans [facility] Consent - Hernia Repair - Ventral/Incisional/Umbilical (Jaxon Mynhier): discussed with  patient and provided information. Pt Education - [facility] Mesh education: discussed with patient and provided information. Pt Education - Pamphlet Given - Hernia Surgery: discussed with patient and provided information. 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 Could excise since it is causing her discomfort. This requires general anesthesia since it is deep and of moderate size. Hopefully removal and closure. Possible need for drain. Risk of recurrence. She is leaning toward considering. Like to make sure hemorrhoid issues are under control first. Like to make sure pulmonary is okay with anesthesia Current Plans The pathophysiology of skin & subcutaneous masses was discussed. Natural history risks without surgery were discussed. I recommended surgery to remove the mass. I explained the technique of removal with use of local anesthesia & possible need for more aggressive sedation/anesthesia for patient comfort.  Risks such as bleeding, infection, wound breakdown, heart attack, death, and other risks were discussed. I noted a good likelihood this will help address the problem. Possibility that this will not correct all symptoms was explained. Possibility of regrowth/recurrence of the mass was discussed. We will work to minimize complications. Questions were answered. The patient expresses understanding & wishes to proceed with  surgery.  IRRITABLE BOWEL SYNDROME WITH CONSTIPATION (K58.1) Impression: Severe constipation most likely IBS related stabilizing with LinZess. I recommended that she do MiraLAX. I noted this is the most important thing to avoid future hemorrhoid problems. Is working with Adrienne. Michail Mitchell on that Current Plans Pt Education - [facility] (Lucresia Simic) Pt Education - [facility] IBS patient info: discussed with patient and provided information. MORBID OBESITY WITH BMI OF 40.0-44.9, ADULT (E66.01) Impression: Morbid obesity status post laparoscopic gastric banding 2010. Lost to follow-up with our group. [facility] to [facility] and had removal of her band and bariatric surgery by Adrienne. Darnell Level in 2018. She does not want go back to him. She has not seen anyone with [facility] for the past few years. His plateaued and is actually gained some weight in the past couple years. She noted that she had episodes of hypoglycemia that concerned her so she switched back to a more standard diet. She is interested in reestablishing. We'll see if [name] and his very to [facility] can help stabilize this.  I cautioned that her risk of hernia recurrence much higher if she does not stabilize or improve her weight. Because it seems incarcerated and is sensitive, I do not think this can be observed only. HISTORY OF LUNG CANCER (Z85.118) H/O GASTRIC BYPASS (Z98.84) Impression: See if she can reestablish with our group for long-term bariatric care after gastric bypass surgery and prior lap band removal HISTORY OF REMOVAL OF LAPAROSCOPIC GASTRIC BANDING DEVICE (G31.51) Impression: LAP-BAND placement by [name] 2010. Lap and removal in [facility] around the time of gastric bypass surgery in 2018. Adrienne. Darnell Level    Signed electronically by Adrienne Hector, MD (04/01/2020 1:46 PM)

## 2020-05-31 NOTE — H&P (Signed)
[name] Appointment: 04/01/2020 10:15 AM Location: [facility] Patient #: [id number] DOB: 1946-06-22 Single / Language: [name] / Race: Black or African American Female   History of Present Illness [name] MD; 04/01/2020 1:38 PM) The patient is a 74 year old female who presents with hemorrhoids. Note for "Hemorrhoids": ` ` ` Patient sent for surgical consultation at the request of Dr Michail Sermon  Chief Complaint: Hemorrhoids, enlarging right buttock mass, incisional hernia. ` ` The patient is a pleasant obese woman. My partner, Dr. Lucia Gaskins, had placed a laparoscopic gastric band and her over 10 years ago in 2010. Lost to follow-up. She went to [facility] [place] and ended up having her band removed and switched to gastric bypass in 2018. She has had some pain and discomfort in her abdomen when she has hernia. She also noted a lump on her right buttock that has become more painful and bothersome the past few months. Saw her primary care physician. Ms. saw to be probably a lipoma. Surgical consultation offered.  Her main complaint today is that she has a lot of anal pain that she thinks is related to her hemorrhoids. She tends to have issues of constipation and is followed by Dr. Michail Sermon with [facility]. Takes LinZess to move her bowels most days. Mineral at night. Had a colonoscopy this year that was underwhelming. She is to trouble severe constipation moving her bowels once a week with a lot of straining. She's had hemorrhoids for many years. Urinal her bowels are better, she is still struggling with bleeding and irritation. Burning when she moves. Uncomfortable to sit. Careful sharp pain. History of COPD on oxygen intermittently. Denies any cardiac or stroke issues. She is not on blood thinners. She no longer smokes. She has not seen anyone for her bariatric surgery in the past 3 years. She has lost some weight of the gastric  bypass but then had issues of with hypoglycemia and adjusted her diet back to a more regular type diet and is gaining some weight back. [facility]. She has a history of lung cancer and is a survivor.  (Review of systems as stated in this history (HPI) or in the review of systems. Otherwise all other 12 point ROS are negative) ` ` ###########################################`  This patient encounter took 60 minutes today to perform the following: obtain history, perform exam, review outside records, interpret tests & imaging, counsel the patient on their diagnosis; and, document this encounter, including findings & plan in the electronic health record (EHR).   Past Surgical History [name], [place]; 04/01/2020 9:52 AM) Colon Polyp Removal - Open  Foot Surgery  Right. Gastric Bypass  Hysterectomy (not due to cancer) - Partial  Lap Band  Lung Surgery  Left. Mammoplasty; Reduction  Bilateral. Oral Surgery  Spinal Surgery - Neck  Thyroid Surgery  Tonsillectomy   Diagnostic Studies History [name], [place]; 04/01/2020 9:51 AM) Colonoscopy  within last year 1-5 years ago Mammogram  within last year 1-3 years ago Pap Smear  1-5 years ago  Allergies [name], RMA; 04/01/2020 9:52 AM) No Known Drug Allergies  [11/15/2014]: Allergies Reconciled   Medication History [name], [place]; 04/01/2020 9:57 AM) Linzess (72MCG Capsule, Oral) Active. Benadryl Allergy (25MG  Capsule, Oral) Active. Biotin (5000MCG Tablet, Oral) Active. Calcium 500/D (500-400MG -UNIT Tablet Chewable, Oral) Active. Nebulizer/Adult Mask Active. ProAir HFA (108 (90 Base)MCG/ACT Aerosol Soln, Inhalation) Active. ZyrTEC Allergy (10MG  Tablet,  Oral) Active. Albuterol (90MCG/ACT Aerosol Soln, Inhalation) Active. FreeStyle InsuLinx System (w/Device Kit,) Active. Medications Reconciled  Social History [name], [place];  04/01/2020 9:52 AM) Alcohol use  Occasional alcohol use. Caffeine use  Carbonated beverages, Coffee, Tea. No drug use  Tobacco use  Former smoker.  Family History [name], [place]; 04/01/2020 9:52 AM) Alcohol Abuse  Family Members In General, Mother. Arthritis  Mother. Cancer  Son. Cerebrovascular Accident  Father, Mother. Depression  Mother. Diabetes Mellitus  Mother. Hypertension  Daughter, Family Members In General, Father, Mother.  Pregnancy / Birth History [name], [place]; 04/01/2020 9:52 AM) Age at menarche  46 years. Age of menopause  65-50 Gravida  3 Length (months) of breastfeeding  3-6 Maternal age  23-20 Para  3  Other Problems [name], [place]; 04/01/2020 9:52 AM) Anxiety Disorder  Arthritis  Back Pain  Chronic Obstructive Lung Disease  Diabetes Mellitus  Emphysema Of Lung  Hemorrhoids  Home Oxygen Use  Lung Cancer  Umbilical Hernia Repair     Review of Systems [name] RMA; 04/01/2020 9:52 AM) General Not Present- Appetite Loss, Chills, Fatigue, Fever, Night Sweats, Weight Gain and Weight Loss. Skin Present- Dryness. Not Present- Change in Wart/Mole, Hives, Jaundice, New Lesions, Non-Healing Wounds, Rash and Ulcer. HEENT Present- Seasonal Allergies and Wears glasses/contact lenses. Not Present- Earache, Hearing Loss, Hoarseness, Nose Bleed, Oral Ulcers, Ringing in the Ears, Sinus Pain, Sore Throat, Visual Disturbances and Yellow Eyes. Respiratory Present- Snoring and Wheezing. Not Present- Bloody sputum, Chronic Cough and Difficulty Breathing. Breast Not Present- Breast Mass, Breast Pain, Nipple Discharge and Skin Changes. Cardiovascular Present- Leg Cramps and Swelling of Extremities. Not Present- Chest Pain, Difficulty Breathing Lying Down, Palpitations, Rapid Heart Rate and Shortness of Breath. Gastrointestinal Present- Change in Bowel Habits, Constipation, Difficulty Swallowing, Excessive gas, Hemorrhoids and Rectal  Pain. Not Present- Abdominal Pain, Bloating, Bloody Stool, Chronic diarrhea, Gets full quickly at meals, Indigestion, Nausea and Vomiting. Female Genitourinary Present- Frequency and Urgency. Not Present- Nocturia, Painful Urination and Pelvic Pain. Musculoskeletal Present- Back Pain, Joint Pain, Joint Stiffness, Muscle Pain, Muscle Weakness and Swelling of Extremities. Neurological Present- Tingling, Tremor, Trouble walking and Weakness. Not Present- Decreased Memory, Fainting, Headaches, Numbness and Seizures. Psychiatric Present- Anxiety, Change in Sleep Pattern, Depression, Fearful and Frequent crying. Not Present- Bipolar. Endocrine Present- Heat Intolerance and New Diabetes. Not Present- Cold Intolerance, Excessive Hunger, Hair Changes and Hot flashes. Hematology Present- Persistent Infections. Not Present- Blood Thinners, Easy Bruising, Excessive bleeding, Gland problems and HIV.  Vitals Lattie Haw Caldwell RMA; 04/01/2020 9:57 AM) 04/01/2020 9:57 AM Weight: 221.25 lb Height: 60in Body Surface Area: 1.95 m Body Mass Index: 43.21 kg/m  Temp.: 98.27F  Pulse: 81 (Regular)  P.OX: 97% (Room air) BP: 118/78(Sitting, Left Arm, Standard)       Physical Exam [name] MD; 04/01/2020 10:56 AM) General Mental Status-Alert. General Appearance-Not in acute distress, Not Sickly. Orientation-Oriented X3. Hydration-Well hydrated. Voice-Normal.  Integumentary Global Assessment Upon inspection and palpation of skin surfaces of the - Axillae: non-tender, no inflammation or ulceration, no drainage. and Distribution of scalp and body hair is normal. General Characteristics Temperature - normal warmth is noted.  Head and Neck Head-normocephalic, atraumatic with no lesions or palpable masses. Face Global Assessment - atraumatic, no absence of expression. Neck Global Assessment - no abnormal movements, no bruit auscultated on the right, no bruit auscultated on the left,  no decreased range of motion, non-tender. Trachea-midline. Thyroid Gland Characteristics - non-tender.  Eye Eyeball - Left-Extraocular movements intact, No  Nystagmus - Left. Eyeball - Right-Extraocular movements intact, No Nystagmus - Right. Cornea - Left-No Hazy - Left. Cornea - Right-No Hazy - Right. Sclera/Conjunctiva - Left-No scleral icterus, No Discharge - Left. Sclera/Conjunctiva - Right-No scleral icterus, No Discharge - Right. Pupil - Left-Direct reaction to light normal. Pupil - Right-Direct reaction to light normal.  ENMT Ears Pinna - Left - no drainage observed, no generalized tenderness observed. Pinna - Right - no drainage observed, no generalized tenderness observed. Nose and Sinuses External Inspection of the Nose - no destructive lesion observed. Inspection of the nares - Left - quiet respiration. Inspection of the nares - Right - quiet respiration. Mouth and Throat Lips - Upper Lip - no fissures observed, no pallor noted. Lower Lip - no fissures observed, no pallor noted. Nasopharynx - no discharge present. Oral Cavity/Oropharynx - Tongue - no dryness observed. Oral Mucosa - no cyanosis observed. Hypopharynx - no evidence of airway distress observed.  Chest and Lung Exam Inspection Movements - Normal and Symmetrical. Accessory muscles - No use of accessory muscles in breathing. Palpation Palpation of the chest reveals - Non-tender. Auscultation Breath sounds - Normal and Clear.  Cardiovascular Auscultation Rhythm - Regular. Murmurs & Other Heart Sounds - Auscultation of the heart reveals - No Murmurs and No Systolic Clicks.  Abdomen Inspection Inspection of the abdomen reveals - No Visible peristalsis and No Abnormal pulsations. Umbilicus - No Bleeding, No Urine drainage. Palpation/Percussion Palpation and Percussion of the abdomen reveal - Soft, Non Tender, No Rebound tenderness, No Rigidity (guarding) and No Cutaneous hyperesthesia. Note:  Abdomen obese but soft. Periumbilical bulging 6 cm mass partially reducible down to a smaller defect consistent with a periumbilical hernia. No diastasis recti. No umbilical or other anterior abdominal wall hernias   Female Genitourinary Sexual Maturity Tanner 5 - Adult hair pattern. Note: No vaginal bleeding nor discharge. No inguinal hernias. No inguinal lymphadenopathy   Rectal Note: Right posterior buttock 6 cm subcutaneous mass overlying initial tuberosity. Consistent with lipoma. Sensitive. Not infected or inflamed. Small skin tag on right inner buttock 1 cm consistent with acrochordon.  Perirectal skin with minimal irritation. No fissure or fistula or abscess. Tolerates digital and anoscopic exam but sensitive. Grade 2 inflamed hemorrhoids right posterior greater than left lateral. Right anterior grade 1. Old stool in vault. I feel no other masses or tumors to 5 cm.  After long discussion I offered banding help in trying controlled the hemorrhoids. I placed 3 bands with good purchase on right posterior & left lateral   Peripheral Vascular Upper Extremity Inspection - Left - No Cyanotic nailbeds - Left, Not Ischemic. Inspection - Right - No Cyanotic nailbeds - Right, Not Ischemic.  Neurologic Neurologic evaluation reveals -normal attention span and ability to concentrate, able to name objects and repeat phrases. Appropriate fund of knowledge , normal sensation and normal coordination. Mental Status Affect - not angry, not paranoid. Cranial Nerves-Normal Bilaterally. Gait-Normal.  Neuropsychiatric Mental status exam performed with findings of-able to articulate well with normal speech/language, rate, volume and coherence, thought content normal with ability to perform basic computations and apply abstract reasoning and no evidence of hallucinations, delusions, obsessions or homicidal/suicidal ideation.  Musculoskeletal Global Assessment Spine, Ribs and Pelvis - no  instability, subluxation or laxity. Right Upper Extremity - no instability, subluxation or laxity.  Lymphatic Head & Neck  General Head & Neck Lymphatics: Bilateral - Description - No Localized lymphadenopathy. Axillary  General Axillary Region: Bilateral - Description - No Localized lymphadenopathy. Femoral & Inguinal  Generalized Femoral & Inguinal Lymphatics: Left - Description - No Localized lymphadenopathy. Right - Description - No Localized lymphadenopathy.   Results Ardeth Sportsman MD; 04/01/2020 1:45 PM) Procedures  Name Value Date Hemorrhoids Procedure Internal exam: Internal Hemorroids ( non-bleeding) Internal Hemorrhoids (bleeding) *Procedure: banded Other: Perirectal skin with minimal irritation. No fissure or fistula or abscess. No condyloma. No pilonidal disease. Tolerates digital and anoscopic exam but sensitive. Grade 2 inflamed hemorrhoids right posterior greater than left lateral. Right anterior grade 1. Old stool in vault. I feel no other masses or tumors to 5 cm............Marland KitchenAfter long discussion I offered banding help in trying controlled the hemorrhoids. I placed 3 bands with good purchase on right posterior & left lateral piles. She tolerated well.  Performed: 04/01/2020 10:57 AM    Assessment & Plan Ardeth Sportsman MD; 04/01/2020 1:42 PM) PROLAPSED INTERNAL HEMORRHOIDS, GRADE 2 (K64.1) Impression: The anatomy & physiology of the anorectal region was discussed. The pathophysiology of hemorrhoids and differential diagnosis was discussed. Natural history progression was discussed. I stressed the importance of a bowel regimen to have daily soft bowel movements to minimize progression of disease. Goal of one BM / day ideal. Use of wet wipes, warm baths, avoiding straining, etc were emphasized.  Educational handouts further explaining the pathology, treatment options, and bowel regimen were given as well. The patient expressed  understanding. Current Plans HEMORRHOIDECTOMY, INTERNAL, RUBBER BAND LIGATION (97970) The anatomy & physiology of the anorectal region was discussed. The pathophysiology of hemorrhoids and differential diagnosis was discussed. Natural history progression was discussed. I stressed the importance of a bowel regimen to have daily soft bowel movements to minimize progression of disease.  The patient's symptoms are not adequately controlled. Therefore, I recommended banding to treat the hemorrhoids. I went over the technique, risks, benefits, and alternatives. Goals of post-operative recovery were discussed as well. Questions were answered. The patient expressed understanding & wished to proceed.  The patient was positioned in the lateral decubitus position. Perianal & rectal examination was done. Using anoscopy, I ligated the hemorrhoids above the dentate line with banding. 3 bands applied. One good on right posterior. Another one with good purchase on left lateral. The patient tolerated the procedure well. Educational handouts further explaining the pathology, treatment options, and bowel regimen were given as well.  Pt Education - [facility] Hemorrhoids (Brandalyn Harting): discussed with patient and provided information. Pt Education - Pamphlet Given - The Hemorrhoid Book: discussed with patient and provided information. 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  It would be technically feasible due to laparoscopic underlay repair with mesh. Obesity makes recurrence risk increased. However it is incarcerated and is causing some discomfort. Seem she gets over the hemorrhoids first the can consider that and may be dated same time as a lipoma surgery if she wishes to be aggressive.  I would like  pulmonary clearance given her history of COPD with occasional oxygen dependence. She usually sees Dr. Maple Hudson.  Noted to be helpful for her to be followed with her weight issues. She does not want to [facility] back to [facility] [place]. We can try and help reestablish with [place] again. See if Dr. Ezzard Standing or his partners can help get her back into the bariatric program for some nutritional education. Current Plans [facility] Consent - Hernia Repair - Ventral/Incisional/Umbilical (Hazelee Harbold): discussed with patient and provided information.  Pt Education - [facility] Mesh education: discussed with patient and provided information. Pt Education - Pamphlet Given - Hernia Surgery: discussed with patient and provided information. 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 Could excise since it is causing her discomfort. This requires general anesthesia since it is deep and of moderate size. Hopefully removal and closure. Possible need for drain. Risk of recurrence. She is leaning toward considering. Like to make sure hemorrhoid issues are under control first. Like to make sure pulmonary is okay with anesthesia Current Plans The pathophysiology of skin & subcutaneous masses was discussed. Natural history risks without surgery were discussed. I recommended surgery to remove the mass. I explained the technique of removal with use of local anesthesia & possible need for more aggressive sedation/anesthesia for patient comfort.  Risks such as bleeding, infection, wound breakdown, heart attack, death, and other risks were discussed. I noted a good likelihood this will help address the problem. Possibility that this will not correct all symptoms was explained. Possibility of regrowth/recurrence of the mass was discussed. We will work to minimize complications. Questions were answered. The patient expresses understanding & wishes to proceed with  surgery.  IRRITABLE BOWEL SYNDROME WITH CONSTIPATION (K58.1) Impression: Severe constipation most likely IBS related stabilizing with LinZess. I recommended that she do MiraLAX. I noted this is the most important thing to avoid future hemorrhoid problems. Is working with Dr. Michail Sermon on that Current Plans Pt Education - Bent (Taitum Menton) Pt Education - [facility] IBS patient info: discussed with patient and provided information. MORBID OBESITY WITH BMI OF 40.0-44.9, ADULT (E66.01) Impression: Morbid obesity status post laparoscopic gastric banding 2010. Lost to follow-up with our group. [facility] and had removal of her band and bariatric surgery by Dr. Darnell Level in 2018. She does not want go back to him. She has not seen anyone with theatrics for the past few years. His plateaued and is actually gained some weight in the past couple years. She noted that she had episodes of hypoglycemia that concerned her so she switched back to a more standard diet. She is interested in reestablishing. We'll see if Dr. Lucia Gaskins and his very to [facility] can help stabilize this.  I cautioned that her risk of hernia recurrence much higher if she does not stabilize or improve her weight. Because it seems incarcerated and is sensitive, I do not think this can be observed only. HISTORY OF LUNG CANCER (Z85.118) H/O GASTRIC BYPASS (Z98.84) Impression: See if she can reestablish with our group for long-term bariatric care after gastric bypass surgery and prior lap band removal HISTORY OF REMOVAL OF LAPAROSCOPIC GASTRIC BANDING DEVICE (R51.88) Impression: LAP-BAND placement by Dr. Lucia Gaskins 2010. Lap and removal in [facility] around the time of gastric bypass surgery in 2018. Dr. Darnell Level    Signed electronically by [name], MD (04/01/2020 1:46 PM)

## 2020-05-31 NOTE — Progress Notes (Signed)
COVID Vaccine Completed:Yes Date COVID Vaccine completed:08/22/19- Booster 03/26/20 COVID vaccine manufacturer: Pfizer      PCP - Dr. Carney Living Cardiologist -   Chest x-ray - no EKG -05/31/20- epic and chart  Stress Test - no ECHO - no Cardiac Cath - 2013-Epic Pacemaker/ICD device last checked:NA  Sleep Study - yes. Too mild for a C-Pap CPAP - no  Fasting Blood Sugar - doesn't test Checks Blood Sugar _____ times a day  Blood Thinner Instructions:AS[facility]/ Dr. Delfina Redwood Aspirin Instructions:stop 7 days prior/Gross Last Dose:05/24/20  Anesthesia review:   Patient denies shortness of breath, fever, cough and chest pain at PAT appointmentyes   Patient verbalized understanding of instructions that were given to them at the PAT appointment. Patient was also instructed that they will need to review over the PAT instructions again at home before surgery. Yes Pt is SOB climbing stairs. Sometime while doing housework and ADLs. She uses home O2 as needed.   She reports that her blood sugar can drop spontaneously at night for no reason and she gets confused

## 2020-06-03 ENCOUNTER — Other Ambulatory Visit (HOSPITAL_COMMUNITY)
Admission: RE | Admit: 2020-06-03 | Discharge: 2020-06-03 | Disposition: A | Discharge status: Home / Self Care | Payer: Medicare Other | Source: Ambulatory Visit | Attending: Surgery | Admitting: Surgery

## 2020-06-03 DIAGNOSIS — Z01812 Encounter for preprocedural laboratory examination: Secondary | ICD-10-CM | POA: Diagnosis not present

## 2020-06-03 DIAGNOSIS — Z20822 Contact with and (suspected) exposure to covid-19: Secondary | ICD-10-CM | POA: Diagnosis not present

## 2020-06-03 LAB — SARS CORONAVIRUS 2 (TAT 6-24 HRS): SARS Coronavirus 2: NEGATIVE

## 2020-06-05 MED ORDER — BUPIVACAINE LIPOSOME 1.3 % IJ SUSP
20.0000 mL | Freq: Once | INTRAMUSCULAR | Status: DC
  Filled 2020-06-05: qty 20

## 2020-06-06 ENCOUNTER — Other Ambulatory Visit: Payer: Self-pay | Admitting: Surgery

## 2020-06-06 ENCOUNTER — Ambulatory Visit (HOSPITAL_COMMUNITY)
Admission: RE | Admit: 2020-06-06 | Discharge: 2020-06-07 | Disposition: A | Discharge status: Home / Self Care | Payer: Medicare Other | Attending: Surgery | Admitting: Surgery

## 2020-06-06 ENCOUNTER — Encounter (HOSPITAL_COMMUNITY)
Admission: RE | Disposition: A | Discharge status: Home / Self Care | Payer: Self-pay | Source: Home / Self Care | Attending: Surgery

## 2020-06-06 ENCOUNTER — Ambulatory Visit (HOSPITAL_COMMUNITY): Payer: Medicare Other | Admitting: Certified Registered Nurse Anesthetist

## 2020-06-06 ENCOUNTER — Encounter: Payer: Self-pay | Admitting: Surgery

## 2020-06-06 ENCOUNTER — Encounter (HOSPITAL_COMMUNITY): Payer: Self-pay | Admitting: Surgery

## 2020-06-06 DIAGNOSIS — Z9884 Bariatric surgery status: Secondary | ICD-10-CM | POA: Diagnosis not present

## 2020-06-06 DIAGNOSIS — J449 Chronic obstructive pulmonary disease, unspecified: Secondary | ICD-10-CM | POA: Diagnosis present

## 2020-06-06 DIAGNOSIS — Z85118 Personal history of other malignant neoplasm of bronchus and lung: Secondary | ICD-10-CM | POA: Diagnosis not present

## 2020-06-06 DIAGNOSIS — K648 Other hemorrhoids: Secondary | ICD-10-CM | POA: Diagnosis not present

## 2020-06-06 DIAGNOSIS — Z79899 Other long term (current) drug therapy: Secondary | ICD-10-CM | POA: Diagnosis not present

## 2020-06-06 DIAGNOSIS — R222 Localized swelling, mass and lump, trunk: Secondary | ICD-10-CM | POA: Diagnosis not present

## 2020-06-06 DIAGNOSIS — J9611 Chronic respiratory failure with hypoxia: Secondary | ICD-10-CM | POA: Diagnosis not present

## 2020-06-06 DIAGNOSIS — Z6841 Body Mass Index (BMI) 40.0 and over, adult: Secondary | ICD-10-CM | POA: Diagnosis not present

## 2020-06-06 DIAGNOSIS — D171 Benign lipomatous neoplasm of skin and subcutaneous tissue of trunk: Secondary | ICD-10-CM | POA: Insufficient documentation

## 2020-06-06 DIAGNOSIS — K581 Irritable bowel syndrome with constipation: Secondary | ICD-10-CM | POA: Diagnosis not present

## 2020-06-06 DIAGNOSIS — K430 Incisional hernia with obstruction, without gangrene: Secondary | ICD-10-CM | POA: Insufficient documentation

## 2020-06-06 DIAGNOSIS — Z9981 Dependence on supplemental oxygen: Secondary | ICD-10-CM | POA: Diagnosis not present

## 2020-06-06 DIAGNOSIS — G4733 Obstructive sleep apnea (adult) (pediatric): Secondary | ICD-10-CM | POA: Diagnosis not present

## 2020-06-06 DIAGNOSIS — Z87891 Personal history of nicotine dependence: Secondary | ICD-10-CM | POA: Insufficient documentation

## 2020-06-06 HISTORY — PX: MASS EXCISION: SHX2000

## 2020-06-06 HISTORY — PX: VENTRAL HERNIA REPAIR: SHX424

## 2020-06-06 LAB — GLUCOSE, CAPILLARY
Glucose-Capillary: 76 mg/dL (ref 70–99)
Glucose-Capillary: 132 mg/dL — ABNORMAL HIGH (ref 70–99)
Glucose-Capillary: 183 mg/dL — ABNORMAL HIGH (ref 70–99)

## 2020-06-06 SURGERY — REPAIR, HERNIA, VENTRAL, LAPAROSCOPIC
Anesthesia: General | Site: Hip | Laterality: Right

## 2020-06-06 MED ORDER — DICLOFENAC SODIUM 1 % EX GEL
1.0000 "application " | Freq: Four times a day (QID) | CUTANEOUS | Status: DC | PRN
  Filled 2020-06-06: qty 100

## 2020-06-06 MED ORDER — EPHEDRINE 5 MG/ML INJ
INTRAVENOUS | Status: AC
  Filled 2020-06-06: qty 10

## 2020-06-06 MED ORDER — GLUCOSE 4 G PO CHEW: 20.0000 g | CHEWABLE_TABLET | ORAL | Status: DC | PRN

## 2020-06-06 MED ORDER — ALBUTEROL SULFATE (2.5 MG/3ML) 0.083% IN NEBU: 2.5000 mg | INHALATION_SOLUTION | Freq: Three times a day (TID) | RESPIRATORY_TRACT | Status: DC | PRN

## 2020-06-06 MED ORDER — METHOCARBAMOL 500 MG PO TABS: 500.0000 mg | ORAL_TABLET | Freq: Four times a day (QID) | ORAL | Status: DC | PRN

## 2020-06-06 MED ORDER — SIMETHICONE 80 MG PO CHEW: 40.0000 mg | CHEWABLE_TABLET | Freq: Four times a day (QID) | ORAL | Status: DC | PRN

## 2020-06-06 MED ORDER — POLYVINYL ALCOHOL 1.4 % OP SOLN
Freq: Every day | OPHTHALMIC | Status: DC | PRN
  Filled 2020-06-06: qty 15

## 2020-06-06 MED ORDER — ACETAMINOPHEN 500 MG PO TABS
1000.0000 mg | ORAL_TABLET | ORAL | Status: AC
  Administered 2020-06-06: 1000 mg via ORAL
  Filled 2020-06-06: qty 2

## 2020-06-06 MED ORDER — LIP MEDEX EX OINT
1.0000 "application " | TOPICAL_OINTMENT | Freq: Two times a day (BID) | CUTANEOUS | Status: DC
  Administered 2020-06-06: 1 via TOPICAL
  Filled 2020-06-06: qty 7

## 2020-06-06 MED ORDER — BUPIVACAINE LIPOSOME 1.3 % IJ SUSP
INTRAMUSCULAR | Status: DC | PRN
  Administered 2020-06-06: 20 mL

## 2020-06-06 MED ORDER — CHLORHEXIDINE GLUCONATE 0.12 % MT SOLN
15.0000 mL | Freq: Once | OROMUCOSAL | Status: AC
  Administered 2020-06-06: 15 mL via OROMUCOSAL

## 2020-06-06 MED ORDER — CHLORHEXIDINE GLUCONATE CLOTH 2 % EX PADS: 6.0000 | MEDICATED_PAD | Freq: Once | CUTANEOUS | Status: DC

## 2020-06-06 MED ORDER — ROCURONIUM BROMIDE 10 MG/ML (PF) SYRINGE
PREFILLED_SYRINGE | INTRAVENOUS | Status: AC
  Filled 2020-06-06: qty 10

## 2020-06-06 MED ORDER — MAGNESIUM OXIDE 400 (241.3 MG) MG PO TABS
400.0000 mg | ORAL_TABLET | Freq: Two times a day (BID) | ORAL | Status: DC
  Administered 2020-06-06 – 2020-06-07 (×2): 400 mg via ORAL
  Filled 2020-06-06 (×2): qty 1

## 2020-06-06 MED ORDER — MIDAZOLAM HCL 2 MG/2ML IJ SOLN
INTRAMUSCULAR | Status: AC
  Filled 2020-06-06: qty 2

## 2020-06-06 MED ORDER — ADULT MULTIVITAMIN W/MINERALS CH
1.0000 | ORAL_TABLET | Freq: Every day | ORAL | Status: DC
  Administered 2020-06-07: 1 via ORAL
  Filled 2020-06-06: qty 1

## 2020-06-06 MED ORDER — SUGAMMADEX SODIUM 500 MG/5ML IV SOLN
INTRAVENOUS | Status: AC
  Filled 2020-06-06: qty 5

## 2020-06-06 MED ORDER — MOMETASONE FURO-FORMOTEROL FUM 100-5 MCG/ACT IN AERO
2.0000 | INHALATION_SPRAY | Freq: Two times a day (BID) | RESPIRATORY_TRACT | Status: DC
  Administered 2020-06-06 – 2020-06-07 (×2): 2 via RESPIRATORY_TRACT
  Filled 2020-06-06: qty 8.8

## 2020-06-06 MED ORDER — DIPHENHYDRAMINE HCL 50 MG/ML IJ SOLN: 12.5000 mg | Freq: Four times a day (QID) | INTRAMUSCULAR | Status: DC | PRN

## 2020-06-06 MED ORDER — LIDOCAINE HCL (CARDIAC) PF 100 MG/5ML IV SOSY
PREFILLED_SYRINGE | INTRAVENOUS | Status: DC | PRN
  Administered 2020-06-06: 70 mg via INTRAVENOUS

## 2020-06-06 MED ORDER — EPHEDRINE 5 MG/ML INJ
INTRAVENOUS | Status: AC
  Filled 2020-06-06: qty 20

## 2020-06-06 MED ORDER — BIOTIN 5000 MCG PO TABS: 5000.0000 ug | ORAL_TABLET | Freq: Every day | ORAL | Status: DC

## 2020-06-06 MED ORDER — HYDROMORPHONE HCL 1 MG/ML IJ SOLN: 0.2500 mg | INTRAMUSCULAR | Status: DC | PRN

## 2020-06-06 MED ORDER — CEFAZOLIN SODIUM-DEXTROSE 2-4 GM/100ML-% IV SOLN
2.0000 g | Freq: Three times a day (TID) | INTRAVENOUS | Status: AC
  Administered 2020-06-06: 2 g via INTRAVENOUS
  Filled 2020-06-06: qty 100

## 2020-06-06 MED ORDER — ENSURE PRE-SURGERY PO LIQD
592.0000 mL | Freq: Once | ORAL | Status: DC
  Filled 2020-06-06: qty 592

## 2020-06-06 MED ORDER — SUGAMMADEX SODIUM 500 MG/5ML IV SOLN
INTRAVENOUS | Status: DC | PRN
  Administered 2020-06-06: 300 mg via INTRAVENOUS

## 2020-06-06 MED ORDER — ALBUMIN HUMAN 5 % IV SOLN: INTRAVENOUS | Status: DC | PRN

## 2020-06-06 MED ORDER — ORAL CARE MOUTH RINSE: 15.0000 mL | Freq: Once | OROMUCOSAL | Status: AC

## 2020-06-06 MED ORDER — CEFAZOLIN SODIUM-DEXTROSE 2-4 GM/100ML-% IV SOLN
2.0000 g | INTRAVENOUS | Status: AC
  Administered 2020-06-06: 2 g via INTRAVENOUS
  Filled 2020-06-06: qty 100

## 2020-06-06 MED ORDER — GLYCOPYRROLATE PF 0.2 MG/ML IJ SOSY
PREFILLED_SYRINGE | INTRAMUSCULAR | Status: DC | PRN
  Administered 2020-06-06: .2 mg via INTRAVENOUS

## 2020-06-06 MED ORDER — DIPHENHYDRAMINE HCL 12.5 MG/5ML PO ELIX: 12.5000 mg | ORAL_SOLUTION | Freq: Four times a day (QID) | ORAL | Status: DC | PRN

## 2020-06-06 MED ORDER — ACETAMINOPHEN 500 MG PO TABS
1000.0000 mg | ORAL_TABLET | Freq: Four times a day (QID) | ORAL | Status: DC
  Administered 2020-06-06 – 2020-06-07 (×3): 1000 mg via ORAL
  Filled 2020-06-06 (×3): qty 2

## 2020-06-06 MED ORDER — SODIUM CHLORIDE 0.9 % IV SOLN: 250.0000 mL | INTRAVENOUS | Status: DC | PRN

## 2020-06-06 MED ORDER — GABAPENTIN 300 MG PO CAPS
300.0000 mg | ORAL_CAPSULE | ORAL | Status: AC
  Administered 2020-06-06: 300 mg via ORAL
  Filled 2020-06-06: qty 1

## 2020-06-06 MED ORDER — PHENYLEPHRINE HCL-NACL 10-0.9 MG/250ML-% IV SOLN
INTRAVENOUS | Status: DC | PRN
  Administered 2020-06-06: 75 ug/min via INTRAVENOUS

## 2020-06-06 MED ORDER — ENSURE PRE-SURGERY PO LIQD
296.0000 mL | Freq: Once | ORAL | Status: DC
  Filled 2020-06-06: qty 296

## 2020-06-06 MED ORDER — PHENYLEPHRINE 40 MCG/ML (10ML) SYRINGE FOR IV PUSH (FOR BLOOD PRESSURE SUPPORT)
PREFILLED_SYRINGE | INTRAVENOUS | Status: AC
  Filled 2020-06-06: qty 10

## 2020-06-06 MED ORDER — METHOCARBAMOL 500 MG PO TABS: 1000.0000 mg | ORAL_TABLET | Freq: Four times a day (QID) | ORAL | Status: DC | PRN

## 2020-06-06 MED ORDER — BUPIVACAINE-EPINEPHRINE (PF) 0.25% -1:200000 IJ SOLN
INTRAMUSCULAR | Status: AC
  Filled 2020-06-06: qty 60

## 2020-06-06 MED ORDER — PROPOFOL 10 MG/ML IV BOLUS
INTRAVENOUS | Status: DC | PRN
  Administered 2020-06-06: 150 mg via INTRAVENOUS

## 2020-06-06 MED ORDER — LINACLOTIDE 72 MCG PO CAPS
72.0000 ug | ORAL_CAPSULE | Freq: Every day | ORAL | Status: DC
  Filled 2020-06-06: qty 1

## 2020-06-06 MED ORDER — TRAMADOL HCL 50 MG PO TABS
50.0000 mg | ORAL_TABLET | Freq: Four times a day (QID) | ORAL | 0 refills | Status: DC | PRN
Start: 2020-06-06 — End: 2020-10-30

## 2020-06-06 MED ORDER — ALBUTEROL SULFATE HFA 108 (90 BASE) MCG/ACT IN AERS
2.0000 | INHALATION_SPRAY | Freq: Four times a day (QID) | RESPIRATORY_TRACT | Status: DC | PRN
  Filled 2020-06-06: qty 6.7

## 2020-06-06 MED ORDER — LACTATED RINGERS IR SOLN
Status: DC | PRN
  Administered 2020-06-06: 1000 mL

## 2020-06-06 MED ORDER — MAGIC MOUTHWASH
15.0000 mL | Freq: Four times a day (QID) | ORAL | Status: DC | PRN
  Filled 2020-06-06: qty 15

## 2020-06-06 MED ORDER — DIPHENHYDRAMINE HCL 25 MG PO CAPS
50.0000 mg | ORAL_CAPSULE | Freq: Every day | ORAL | Status: DC
  Administered 2020-06-06: 50 mg via ORAL
  Filled 2020-06-06: qty 2

## 2020-06-06 MED ORDER — BUPIVACAINE-EPINEPHRINE 0.25% -1:200000 IJ SOLN
INTRAMUSCULAR | Status: DC | PRN
  Administered 2020-06-06: 60 mL

## 2020-06-06 MED ORDER — GABAPENTIN 300 MG PO CAPS
300.0000 mg | ORAL_CAPSULE | Freq: Two times a day (BID) | ORAL | Status: DC
  Administered 2020-06-06 – 2020-06-07 (×2): 300 mg via ORAL
  Filled 2020-06-06 (×2): qty 1

## 2020-06-06 MED ORDER — LACTATED RINGERS IV SOLN: INTRAVENOUS | Status: DC

## 2020-06-06 MED ORDER — SODIUM CHLORIDE 0.9% FLUSH
3.0000 mL | Freq: Two times a day (BID) | INTRAVENOUS | Status: DC
  Administered 2020-06-06: 3 mL via INTRAVENOUS

## 2020-06-06 MED ORDER — SUCCINYLCHOLINE CHLORIDE 20 MG/ML IJ SOLN
INTRAMUSCULAR | Status: DC | PRN
  Administered 2020-06-06: 120 mg via INTRAVENOUS

## 2020-06-06 MED ORDER — LIDOCAINE HCL (PF) 2 % IJ SOLN
INTRAMUSCULAR | Status: DC | PRN
  Administered 2020-06-06: 1.5 mg/kg/h via INTRADERMAL

## 2020-06-06 MED ORDER — SODIUM CHLORIDE 0.9 % IV SOLN: Freq: Three times a day (TID) | INTRAVENOUS | Status: DC | PRN

## 2020-06-06 MED ORDER — ASPIRIN EC 81 MG PO TBEC
81.0000 mg | DELAYED_RELEASE_TABLET | Freq: Every day | ORAL | Status: DC
  Administered 2020-06-07: 81 mg via ORAL
  Filled 2020-06-06: qty 1

## 2020-06-06 MED ORDER — PHENYLEPHRINE HCL (PRESSORS) 10 MG/ML IV SOLN
INTRAVENOUS | Status: AC
  Filled 2020-06-06: qty 1

## 2020-06-06 MED ORDER — EPHEDRINE SULFATE-NACL 50-0.9 MG/10ML-% IV SOSY
PREFILLED_SYRINGE | INTRAVENOUS | Status: DC | PRN
  Administered 2020-06-06 (×3): 10 mg via INTRAVENOUS
  Administered 2020-06-06: 15 mg via INTRAVENOUS
  Administered 2020-06-06: 10 mg via INTRAVENOUS
  Administered 2020-06-06: 5 mg via INTRAVENOUS

## 2020-06-06 MED ORDER — LACTATED RINGERS IV BOLUS: 1000.0000 mL | Freq: Three times a day (TID) | INTRAVENOUS | Status: DC | PRN

## 2020-06-06 MED ORDER — LORATADINE 10 MG PO TABS
10.0000 mg | ORAL_TABLET | Freq: Every day | ORAL | Status: DC
  Administered 2020-06-07: 10 mg via ORAL
  Filled 2020-06-06: qty 1

## 2020-06-06 MED ORDER — TRAMADOL HCL 50 MG PO TABS: 50.0000 mg | ORAL_TABLET | Freq: Four times a day (QID) | ORAL | Status: DC | PRN

## 2020-06-06 MED ORDER — ROCURONIUM BROMIDE 100 MG/10ML IV SOLN
INTRAVENOUS | Status: DC | PRN
  Administered 2020-06-06: 20 mg via INTRAVENOUS
  Administered 2020-06-06: 30 mg via INTRAVENOUS
  Administered 2020-06-06: 70 mg via INTRAVENOUS

## 2020-06-06 MED ORDER — CALCIUM CARBONATE ANTACID 500 MG PO CHEW: 2.0000 | CHEWABLE_TABLET | Freq: Every day | ORAL | Status: DC | PRN

## 2020-06-06 MED ORDER — MELATONIN 5 MG PO TABS: 10.0000 mg | ORAL_TABLET | Freq: Every evening | ORAL | Status: DC | PRN

## 2020-06-06 MED ORDER — KETAMINE HCL 10 MG/ML IJ SOLN
INTRAMUSCULAR | Status: AC
  Filled 2020-06-06: qty 1

## 2020-06-06 MED ORDER — DEXAMETHASONE SODIUM PHOSPHATE 10 MG/ML IJ SOLN
INTRAMUSCULAR | Status: DC | PRN
  Administered 2020-06-06: 5 mg via INTRAVENOUS

## 2020-06-06 MED ORDER — DOCUSATE SODIUM 100 MG PO CAPS
200.0000 mg | ORAL_CAPSULE | Freq: Every day | ORAL | Status: DC
  Administered 2020-06-06: 200 mg via ORAL
  Filled 2020-06-06: qty 2

## 2020-06-06 MED ORDER — DEXTROSE 50 % IV SOLN
12.5000 g | Freq: Once | INTRAVENOUS | Status: AC
  Administered 2020-06-06: 12.5 g via INTRAVENOUS
  Filled 2020-06-06: qty 50

## 2020-06-06 MED ORDER — MIDAZOLAM HCL 5 MG/5ML IJ SOLN
INTRAMUSCULAR | Status: DC | PRN
  Administered 2020-06-06: 2 mg via INTRAVENOUS

## 2020-06-06 MED ORDER — BISACODYL 10 MG RE SUPP: 10.0000 mg | Freq: Every day | RECTAL | Status: DC | PRN

## 2020-06-06 MED ORDER — KETAMINE HCL 10 MG/ML IJ SOLN
INTRAMUSCULAR | Status: DC | PRN
  Administered 2020-06-06: 10 mg via INTRAVENOUS
  Administered 2020-06-06: 50 mg via INTRAVENOUS

## 2020-06-06 MED ORDER — OXYCODONE HCL 5 MG PO TABS: 5.0000 mg | ORAL_TABLET | ORAL | Status: DC | PRN

## 2020-06-06 MED ORDER — METHOCARBAMOL 1000 MG/10ML IJ SOLN
1000.0000 mg | Freq: Four times a day (QID) | INTRAVENOUS | Status: DC | PRN
  Filled 2020-06-06: qty 10

## 2020-06-06 MED ORDER — ENOXAPARIN SODIUM 40 MG/0.4ML ~~LOC~~ SOLN
40.0000 mg | SUBCUTANEOUS | Status: DC
  Administered 2020-06-07: 40 mg via SUBCUTANEOUS
  Filled 2020-06-06: qty 0.4

## 2020-06-06 MED ORDER — MAGNESIUM HYDROXIDE 400 MG/5ML PO SUSP: 30.0000 mL | Freq: Every day | ORAL | Status: DC | PRN

## 2020-06-06 MED ORDER — FENTANYL CITRATE (PF) 100 MCG/2ML IJ SOLN
INTRAMUSCULAR | Status: DC | PRN
  Administered 2020-06-06: 50 ug via INTRAVENOUS
  Administered 2020-06-06: 100 ug via INTRAVENOUS
  Administered 2020-06-06: 50 ug via INTRAVENOUS

## 2020-06-06 MED ORDER — FENTANYL CITRATE (PF) 250 MCG/5ML IJ SOLN
INTRAMUSCULAR | Status: AC
  Filled 2020-06-06: qty 5

## 2020-06-06 MED ORDER — PSYLLIUM 95 % PO PACK
1.0000 | PACK | Freq: Two times a day (BID) | ORAL | Status: DC
  Administered 2020-06-06 – 2020-06-07 (×2): 1 via ORAL
  Filled 2020-06-06 (×2): qty 1

## 2020-06-06 MED ORDER — SODIUM CHLORIDE 0.9% FLUSH: 3.0000 mL | INTRAVENOUS | Status: DC | PRN

## 2020-06-06 MED ORDER — METOPROLOL TARTRATE 5 MG/5ML IV SOLN: 5.0000 mg | Freq: Four times a day (QID) | INTRAVENOUS | Status: DC | PRN

## 2020-06-06 MED ORDER — PHENYLEPHRINE 40 MCG/ML (10ML) SYRINGE FOR IV PUSH (FOR BLOOD PRESSURE SUPPORT)
PREFILLED_SYRINGE | INTRAVENOUS | Status: DC | PRN
  Administered 2020-06-06: 160 ug via INTRAVENOUS
  Administered 2020-06-06: 80 ug via INTRAVENOUS
  Administered 2020-06-06 (×2): 160 ug via INTRAVENOUS

## 2020-06-06 SURGICAL SUPPLY — 57 items
APL PRP STRL LF DISP 70% ISPRP (MISCELLANEOUS) ×2
APL SKNCLS STERI-STRIP NONHPOA (GAUZE/BANDAGES/DRESSINGS)
APPLIER CLIP 5 13 M/L LIGAMAX5 (MISCELLANEOUS)
APR CLP MED LRG 5 ANG JAW (MISCELLANEOUS)
BENZOIN TINCTURE PRP APPL 2/3 (GAUZE/BANDAGES/DRESSINGS) IMPLANT
BINDER ABDOMINAL 12 ML 46-62 (SOFTGOODS) IMPLANT
CABLE HIGH FREQUENCY MONO STRZ (ELECTRODE) ×3 IMPLANT
CHLORAPREP W/TINT 26 (MISCELLANEOUS) ×3 IMPLANT
CLIP APPLIE 5 13 M/L LIGAMAX5 (MISCELLANEOUS) IMPLANT
COVER SURGICAL LIGHT HANDLE (MISCELLANEOUS) ×3 IMPLANT
COVER WAND RF STERILE (DRAPES) ×3 IMPLANT
DECANTER SPIKE VIAL GLASS SM (MISCELLANEOUS) ×3 IMPLANT
DEVICE SECURE STRAP 25 ABSORB (INSTRUMENTS) ×1 IMPLANT
DEVICE TROCAR PUNCTURE CLOSURE (ENDOMECHANICALS) ×3 IMPLANT
DRAIN CHANNEL 19F RND (DRAIN) ×1 IMPLANT
DRAPE LAPAROTOMY T 102X78X121 (DRAPES) IMPLANT
DRAPE LAPAROTOMY TRNSV 102X78 (DRAPES) IMPLANT
DRAPE WARM FLUID 44X44 (DRAPES) ×3 IMPLANT
DRSG OPSITE POSTOP 4X6 (GAUZE/BANDAGES/DRESSINGS) ×1 IMPLANT
DRSG TEGADERM 2-3/8X2-3/4 SM (GAUZE/BANDAGES/DRESSINGS) ×9 IMPLANT
DRSG TEGADERM 4X4.75 (GAUZE/BANDAGES/DRESSINGS) ×3 IMPLANT
ELECT REM PT RETURN 15FT ADLT (MISCELLANEOUS) ×3 IMPLANT
EVACUATOR SILICONE 100CC (DRAIN) ×1 IMPLANT
GAUZE SPONGE 2X2 8PLY STRL LF (GAUZE/BANDAGES/DRESSINGS) IMPLANT
GAUZE SPONGE 4X4 12PLY STRL (GAUZE/BANDAGES/DRESSINGS) IMPLANT
GLOVE ECLIPSE 8.0 STRL XLNG CF (GLOVE) ×3 IMPLANT
GLOVE INDICATOR 8.0 STRL GRN (GLOVE) ×3 IMPLANT
GOWN STRL REUS W/TWL XL LVL3 (GOWN DISPOSABLE) ×6 IMPLANT
IRRIG SUCT STRYKERFLOW 2 WTIP (MISCELLANEOUS) ×3
IRRIGATION SUCT STRKRFLW 2 WTP (MISCELLANEOUS) IMPLANT
KIT BASIN OR (CUSTOM PROCEDURE TRAY) ×3 IMPLANT
KIT TURNOVER KIT A (KITS) IMPLANT
MARKER SKIN DUAL TIP RULER LAB (MISCELLANEOUS) ×3 IMPLANT
MESH VENTRALIGHT ST 8X10 (Mesh General) ×1 IMPLANT
NDL SPNL 22GX3.5 QUINCKE BK (NEEDLE) IMPLANT
NEEDLE HYPO 22GX1.5 SAFETY (NEEDLE) ×3 IMPLANT
NEEDLE SPNL 22GX3.5 QUINCKE BK (NEEDLE) IMPLANT
PACK GENERAL/GYN (CUSTOM PROCEDURE TRAY) ×3 IMPLANT
PAD POSITIONING PINK XL (MISCELLANEOUS) ×3 IMPLANT
PENCIL SMOKE EVACUATOR (MISCELLANEOUS) IMPLANT
SCISSORS LAP 5X35 DISP (ENDOMECHANICALS) ×3 IMPLANT
SET TUBE SMOKE EVAC HIGH FLOW (TUBING) ×3 IMPLANT
SLEEVE ADV FIXATION 5X100MM (TROCAR) ×3 IMPLANT
SPONGE GAUZE 2X2 STER 10/PKG (GAUZE/BANDAGES/DRESSINGS) ×1
SPONGE LAP 4X18 RFD (DISPOSABLE) IMPLANT
STRIP CLOSURE SKIN 1/2X4 (GAUZE/BANDAGES/DRESSINGS) ×6 IMPLANT
SUT MNCRL AB 4-0 PS2 18 (SUTURE) ×3 IMPLANT
SUT PDS AB 1 CT1 27 (SUTURE) ×12 IMPLANT
SUT PROLENE 1 CT 1 30 (SUTURE) ×15 IMPLANT
SUT VIC AB 3-0 SH 27 (SUTURE)
SUT VIC AB 3-0 SH 27XBRD (SUTURE) IMPLANT
SYR 20ML LL LF (SYRINGE) ×3 IMPLANT
TOWEL OR 17X26 10 PK STRL BLUE (TOWEL DISPOSABLE) ×3 IMPLANT
TRAY LAPAROSCOPIC (CUSTOM PROCEDURE TRAY) ×3 IMPLANT
TROCAR ADV FIXATION 11X100MM (TROCAR) IMPLANT
TROCAR ADV FIXATION 5X100MM (TROCAR) ×3 IMPLANT
TROCAR BLADELESS OPT 5 100 (ENDOMECHANICALS) ×3 IMPLANT

## 2020-06-06 NOTE — Discharge Instructions (Signed)
DRAIN CARE:   You have a closed bulb drain to help you heal.    A bulb drain is a small, plastic reservoir which creates a gentle suction. It is used to remove excess fluid from a surgical wound. The color and amount of fluid will vary. Immediately after surgery, the fluid is bright red. It may gradually change to a yellow color. When the amount decreases to about 1 or 2 tablespoons (15 to 30 cc) per 24 hours, your caregiver will usually remove it.  JP Care  The Jackson-Pratt drainage system has flexible tubing attached to a soft, plastic bulb with a stopper. The drainage end of the tubing, which is flat and white, goes into your body through a small opening near your incision (surgical cut). A stitch holds the drainage end in place. The rest of the tube is outside your body, attached to the bulb. When the bulb is compressed with the stopper in place, it creates a vacuum. This causes a constant gentle suction, which helps draw out fluid that collects under your incision. The bulb should be compressed at all times, except when you are emptying the drainage.  How long you will have your Jackson-Pratt depends on your surgery and the amount of fluid is draining. This is different for everyone. The Jackson-Pratt is usually removed when the drainage is 30 mL or less over 24 hours. To keep track of how much drainage you're having, you will record the amount in a drainage log. It's important to bring the log with you to your follow-up appointments.  Caring for Your Jackson-Pratt at Home In order to care for your Jackson-Pratt at home, you or your caregiver will do the following:  Empty the drain once a day and record the color and amount of drainage  Care for the area where the tubing enters your skin by washing with soap and water.  Milk the tubing to help move clots into the bulb.  Do this before you empty and measure your drainage. Look in the mirror at the tubing. This will help you see where your  hands need to be. Pinch the tubing close to where it goes into your skin between your thumb and forefinger. With the thumb and forefinger of your other hand, pinch the tubing right below your other fingers. Keep your fingers pinched and slide them down the tubing, pushing any clots down toward the bulb. You may want to use alcohol swabs to help you slide your fingers down the tubing. Repeat steps 3 and 4 as necessary to push clots from the tubing into the bulb. If you are not able to move a clot into the bulb, call your doctor's office. The fluid may leak around the insertion site if a clot is blocking the drainage flow. If there is fluid in the bulb and no leakage at the insertion site, the drain is working.  How to Empty Your Jackson-Pratt and Record the Drainage You will need to empty your Jackson-Pratt every day  Gather the following supplies:  Measuring container your nurse gave you Jackson-Pratt Drainage Record  Pen or pencil  Instructions Clean an area to work on. Clean your hands thoroughly. Unplug the stopper on top of your Jackson-Pratt. This will cause the bulb to expand. Do not touch the inside of the stopper or the inner area of the opening on the bulb. Turn your Jackson-Pratt upside down, gently squeeze the bulb, and pour the drainage into the measuring container. Turn your Jackson-Pratt right  side up. Squeeze the bulb until your fingers feel the palm of your hand. Keep squeezing the bulb while you replug the stopper. Make sure the bulb stays fully compressed to ensure constant, gentle suction.    Check the amount and color of drainage in the measuring container. The first couple days after surgery the fluid may be dark red. This is normal. As you heal the fluid may look pink or pale yellow. Record this amount and the color of drainage on your Jackson-Pratt Drainage Record. Flush the drainage down the toilet and rinse the measuring container with water.  Caring for the  Insertion Site  Once you have emptied the drainage, clean your hands again. Check the area around the insertion site. Look for tenderness, swelling, or pus. If you have any of these, or if you have a temperature of 101 F (38.3 C) or higher, you may have an infection. Call your doctor's office.  Sometimes, the drain causes redness the size of a dime at your insertion site. This is normal. Your healthcare provider will tell you if you should place a bandage over the insertion site.  Wash drain site with soap & water (dilute hydrogen peroxide PRN) daily & replace clean dressing / tape    DAILY CARE  Keep the bulb compressed at all times, except while emptying it. The compression creates suction.   Keep sites where the tubes enter the skin dry and covered with a light bandage (dressing).   Tape the tubes to your skin, 1 to 2 inches below the insertion sites, to keep from pulling on your stitches. Tubes are stitched in place and will not slip out.   Pin the bulb to your shirt (not to your pants) with a safety pin.   For the first few days after surgery, there usually is more fluid in the bulb. Empty the bulb whenever it becomes half full because the bulb does not create enough suction if it is too full. Include this amount in your 24 hour totals.   When the amount of drainage decreases, empty the bulb at the same time every day. Write down the amounts and the 24 hour totals. Your caregiver will want to know them. This helps your caregiver know when the tubes can be removed.   (We anticipate removing the drain in 1-3 weeks, depending on when the output is <41mL a day for 2+ days)  If there is drainage around the tube sites, change dressings and keep the area dry. If you see a clot in the tube, leave it alone. However, if the tube does not appear to be draining, let your caregiver know.  TO EMPTY THE BULB  Open the stopper to release suction.   Holding the stopper out of the way, pour  drainage into the measuring cup that was sent home with you.   Measure and write down the amount. If there are 2 bulbs, note the amount of drainage from bulb 1 or bulb 2 and keep the totals separate. Your caregiver will want to know which tube is draining more.   Compress the bulb by folding it in half.   Replace the stopper.   Check the tape that holds the tube to your skin, and pin the bulb to your shirt.  SEEK MEDICAL CARE IF:  The drainage develops a bad odor.   You have an oral temperature above 102 F (38.9 C).   The amount of drainage from your wound suddenly increases or decreases.  You accidentally pull out your drain.   You have any other questions or concerns.  MAKE SURE YOU:   Understand these instructions.   Will watch your condition.   Will get help right away if you are not doing well or get worse.     Call our office if you have any questions about your drain. [phone]   HERNIA REPAIR: POST OP INSTRUCTIONS  ######################################################################  EAT Gradually transition to a high fiber diet with a fiber supplement over the next few weeks after discharge.  Start with a pureed / full liquid diet (see below)  WALK Walk an hour a day.  Control your pain to do that.    CONTROL PAIN Control pain so that you can walk, sleep, tolerate sneezing/coughing, and go up/down stairs.  HAVE A BOWEL MOVEMENT DAILY Keep your bowels regular to avoid problems.  OK to try a laxative to override constipation.  OK to use an antidairrheal to slow down diarrhea.  Call if not better after 2 tries  CALL IF YOU HAVE PROBLEMS/CONCERNS Call if you are still struggling despite following these instructions. Call if you have concerns not answered by these instructions  ######################################################################    1. DIET: Follow a light bland diet & liquids the first 24 hours after arrival home, such as soup,  liquids, starches, etc.  Be sure to drink plenty of fluids.  Quickly advance to a usual solid diet within a few days.  Avoid fast food or heavy meals as your are more likely to get nauseated or have irregular bowels.  A low-fat, high-fiber diet for the rest of your life is ideal.   2. Take your usually prescribed home medications unless otherwise directed.  3. PAIN CONTROL: a. Pain is best controlled by a usual combination of three different methods TOGETHER: i. Ice/Heat ii. Over the counter pain medication iii. Prescription pain medication b. Most patients will experience some swelling and bruising around the hernia(s) such as the bellybutton, groins, or old incisions.  Ice packs or heating pads (30-60 minutes up to 6 times a day) will help. Use ice for the first few days to help decrease swelling and bruising, then switch to heat to help relax tight/sore spots and speed recovery.  Some people prefer to use ice alone, heat alone, alternating between ice & heat.  Experiment to what works for you.  Swelling and bruising can take several weeks to resolve.   c. It is helpful to take an over-the-counter pain medication regularly for the first few weeks.  Choose one of the following that works best for you: i. Naproxen (Aleve, etc)  Two 220mg  tabs twice a day ii. Ibuprofen (Advil, etc) Three 200mg  tabs four times a day (every meal & bedtime) iii. Acetaminophen (Tylenol, etc) 325-650mg  four times a day (every meal & bedtime) d. A  prescription for pain medication should be given to you upon discharge.  Take your pain medication as prescribed.  i. If you are having problems/concerns with the prescription medicine (does not control pain, nausea, vomiting, rash, itching, etc), please call us [phone] to see if we need to switch you to a different pain medicine that will work better for you and/or control your side effect better. ii. If you need a refill on your pain medication, please contact your  pharmacy.  They will contact our office to request authorization. Prescriptions will not be filled after 5 pm or on week-ends.  4. Avoid getting constipated.  Between the surgery and  the pain medications, it is common to experience some constipation.  Increasing fluid intake and taking a fiber supplement (such as Metamucil, Citrucel, FiberCon, MiraLax, etc) 1-2 times a day regularly will usually help prevent this problem from occurring.  A mild laxative (prune juice, Milk of Magnesia, MiraLax, etc) should be taken according to package directions if there are no bowel movements after 48 hours.    5. Wash / shower every day.  You may shower over the dressings as they are waterproof.    6. Remove your waterproof bandages, skin tapes, and other bandages 3 days after surgery. You may replace a dressing/Band-Aid to cover the incision for comfort if you wish. You may leave the incisions open to air.  You may replace a dressing/Band-Aid to cover an incision for comfort if you wish.  Continue to shower over incision(s) after the dressing is off.  7. ACTIVITIES as tolerated:   a. You may resume regular (light) daily activities beginning the next day--such as daily self-care, walking, climbing stairs--gradually increasing activities as tolerated.  Control your pain so that you can walk an hour a day.  If you can walk 30 minutes without difficulty, it is safe to try more intense activity such as jogging, treadmill, bicycling, low-impact aerobics, swimming, etc. b. Save the most intensive and strenuous activity for last such as sit-ups, heavy lifting, contact sports, etc  Refrain from any heavy lifting or straining until you are off narcotics for pain control.   c. DO NOT PUSH THROUGH PAIN.  Let pain be your guide: If it hurts to do something, don't do it.  Pain is your body warning you to avoid that activity for another week until the pain goes down. d. You may drive when you are no longer taking prescription pain  medication, you can comfortably wear a seatbelt, and you can safely maneuver your car and apply brakes. e. Dennis Bast may have sexual intercourse when it is comfortable.   8. FOLLOW UP in our office a. Please call [facility] at [phone] to set up an appointment to see your surgeon in the office for a follow-up appointment approximately 2-3 weeks after your surgery. b. Make sure that you call for this appointment the day you arrive home to insure a convenient appointment time.  9.  If you have disability of FMLA / Family leave forms, please bring the forms to the office for processing.  (do not give to your surgeon).  WHEN TO CALL us [phone]: 1. Poor pain control 2. Reactions / problems with new medications (rash/itching, nausea, etc)  3. Fever over 101.5 F (38.5 C) 4. Inability to urinate 5. Nausea and/or vomiting 6. Worsening swelling or bruising 7. Continued bleeding from incision. 8. Increased pain, redness, or drainage from the incision   The clinic staff is available to answer your questions during regular business hours (8:30am-5pm).  Please don't hesitate to call and ask to speak to one of our nurses for clinical concerns.   If you have a medical emergency, go to the nearest emergency room or call 911.  A surgeon from [facility] is always on call at the hospitals in [facility], [place], [place], [place], [place]  [address] ?  [address] [address], [place],    [address] MAIN: [phone] ? TOLL FREE: [phone] ? FAX: [phone] [email or web address]

## 2020-06-06 NOTE — Transfer of Care (Signed)
Immediate Anesthesia Transfer of Care Note  Patient: [name]  Procedure(s) Performed: LAPAROSCOPIC INCARCERATED INCISIONAL HERNIA REPAIR WITH MESH AND TAP BLOCK (N/A Abdomen) REMOVAL MASS ON RIGHT BUTTOCK (Right Hip)  Patient Location: PACU  Anesthesia Type:General  Level of Consciousness: awake and alert   Airway & Oxygen Therapy: Patient Spontanous Breathing and Patient connected to face mask oxygen  Post-op Assessment: Report given to RN and Post -op Vital signs reviewed and stable  Post vital signs: Reviewed and stable  Last Vitals:  Vitals Value Taken Time  BP 113/71 06/06/20 1721  Temp    Pulse 81 06/06/20 1723  Resp 14 06/06/20 1723  SpO2 100 % 06/06/20 1723  Vitals shown include unvalidated device data.  Last Pain:  Vitals:   06/06/20 1140  TempSrc: Oral  PainSc:       Patients Stated Pain Goal: 4 (96/92/49 3241)  Complications: No complications documented.

## 2020-06-06 NOTE — Interval H&P Note (Signed)
History and Physical Interval Note:  06/06/2020 12:13 PM  [name]  has presented today for surgery, with the diagnosis of [place]  MASS ON RIGHT BUTTOCK.  The various methods of treatment have been discussed with the patient and family. After consideration of risks, benefits and other options for treatment, the patient has consented to  Procedure(s): Eva (N/A) REMOVAL MASS ON RIGHT BUTTOCK (Right) as a surgical intervention.  The patient's history has been reviewed, patient examined, no change in status, stable for surgery.  I have reviewed the patient's chart and labs.  Questions were answered to the patient's satisfaction.    I have re-reviewed the the patient's records, history, medications, and allergies.  I have re-examined the patient.  I again discussed intraoperative plans and goals of post-operative recovery.  The patient agrees to proceed.  [name]  Oct 31, 1945 [id number]  Patient Care Team: Seward Carol, MD as PCP - General (Internal Medicine) Marybelle Killings, MD (Orthopedic Surgery) Leo Grosser Seymour Bars, MD (Inactive) (Obstetrics and Gynecology) Deneise Lever, MD (Pulmonary Disease) Volanda Napoleon, MD (Internal Medicine)  Patient Active Problem List   Diagnosis Date Noted   Chronic respiratory failure with hypoxia ([place]) 04/01/2019   Medication management 02/28/2019   Dysphagia 12/30/2017   Encounter for preoperative examination for general surgical procedure 03/06/2016   Cough 08/02/2015   Obstructive sleep apnea 08/06/2013   COPD with acute bronchitis ([place]) 06/18/2013   Myalgia 06/18/2013   History of laparoscopic adjustable gastric banding, APS. 09/04/2008. 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   DJD (degenerative joint disease)    Dyspnea    Gum disease    Hernia    Hypoglycemic episode in patient with diabetes mellitus ([place]) 2021   Diet controlled diabetes with occasional hypoglycemia at night   Hypothyroidism    no meds   Insomnia    On home O2    as needed   Phlebitis    Pre-diabetes    Thyroid disease    Wears glasses    Weight increase     Past Surgical History:  Procedure Laterality Date   ABDOMINAL HYSTERECTOMY     partial   FOOT SURGERY     right   LAPAROSCOPIC GASTRIC BANDING  Feb 2010   LAPAROSCOPIC ROUX-EN-Y GASTRIC BYPASS WITH UPPER ENDOSCOPY AND REMOVAL OF LAP BAND  2018   LEFT HEART CATHETERIZATION WITH CORONARY ANGIOGRAM N/A 05/02/2012   Procedure: LEFT HEART CATHETERIZATION WITH CORONARY ANGIOGRAM;  Surgeon: Laverda Page, MD;  [facility] C[facility] LAB;  Service: Cardiovascular;  Laterality: N/A;   LUNG LOBECTOMY  2003   left   SPINE SURGERY     fusion on the neck   THYROIDECTOMY     partial   TONSILLECTOMY      Social History   Socioeconomic History   Marital status: Divorced    Spouse name: Not on file   Number of children:  Not on file   Years of education: Not on file   Highest education level: Not on file  Occupational History   Not on file  Tobacco Use   Smoking status: Former Smoker    Packs/day: 2.00    Years: 37.00    Pack years: 74.00    Types: Cigarettes    Quit date: 06/21/2002    Years since quitting: 17.9   Smokeless tobacco: Never Used  Vaping Use   Vaping Use: Never used  Substance and Sexual Activity   Alcohol use: No   Drug use: No   Sexual activity: Not on file  Other Topics Concern   Not on file  Social History Narrative   Not on file   Social Determinants of  Health   Financial Resource Strain:    Difficulty of Paying Living Expenses: Not on file  Food Insecurity:    Worried About Charity fundraiser in the Last Year: Not on file   [employer] of Food in the Last Year: Not on file  Transportation Needs:    Lack of Transportation (Medical): Not on file   Lack of Transportation (Non-Medical): Not on file  Physical Activity:    Days of Exercise per Week: Not on file   Minutes of Exercise per Session: Not on file  Stress:    Feeling of Stress : Not on file  Social Connections:    Frequency of Communication with Friends and Family: Not on file   Frequency of Social Gatherings with Friends and Family: Not on file   Attends Religious Services: Not on file   Active Member of [place] or Organizations: Not on file   Attends Archivist Meetings: Not on file   Marital Status: Not on file  Intimate Partner Violence:    Fear of Current or Ex-Partner: Not on file   Emotionally Abused: Not on file   Physically Abused: Not on file   Sexually Abused: Not on file    Family History  Problem Relation Age of Onset   Stroke Mother    Stroke Father     Medications Prior to Admission  Medication Sig Dispense Refill Last Dose   acetaminophen (TYLENOL) 650 MG CR tablet Take 1,300 mg by mouth every 8 (eight) hours as needed for pain.      albuterol (PROVENTIL) (2.5 MG/3ML) 0.083% nebulizer solution Take 3 mLs (2.5 mg total) by nebulization every 8 (eight) hours as needed for wheezing or shortness of breath. 360 mL 3    albuterol (VENTOLIN HFA) 108 (90 Base) MCG/ACT inhaler Inhale 2 puffs into the lungs every 6 (six) hours as needed for wheezing or shortness of breath. 18 g 12    aspirin EC 81 MG tablet Take 81 mg by mouth daily. Swallow whole.      Biotin 5000 MCG TABS Take 5,000 mcg by mouth daily.       Calcium Carbonate (CALCIUM 500 PO) Take 1,500 mg by mouth daily.      calcium carbonate (TUMS - DOSED IN MG ELEMENTAL CALCIUM) 500 MG chewable tablet  Chew 2 tablets by mouth daily as needed for indigestion or heartburn.      cetirizine (ZYRTEC) 10 MG tablet Take 10 mg by mouth daily.      diclofenac Sodium (VOLTAREN) 1 % GEL Apply 1 application topically 4 (four) times daily as needed (pain).      diphenhydrAMINE (BENADRYL) 25 mg capsule Take 50 mg by mouth daily.  docusate sodium (COLACE) 100 MG capsule Take 200 mg by mouth at bedtime.      Fluticasone-Salmeterol (ADVAIR DISKUS) 100-50 MCG/DOSE AEPB Inhale 1 puff then rinse mouth, twice daily (Patient taking differently: Inhale 1 puff into the lungs 2 (two) times daily. ) 60 each 12    glucose 4 GM chewable tablet Chew 20-24 g by mouth as needed for low blood sugar.      Homeopathic Products (LEG CRAMPS PO) Take 2 tablets by mouth daily.      HYDROcodone-Chlorpheniramine 5-4 MG/5ML SOLN 5 ml every 6 hours if needed for cough (Patient taking differently: Take 5 mLs by mouth every 6 (six) hours as needed (cough). ) 240 mL 0    linaclotide (LINZESS) 72 MCG capsule Take 72 mcg by mouth daily before breakfast.      Magnesium 400 MG TABS Take 400 mg by mouth 2 (two) times daily.      Melatonin 10 MG CAPS Take 10 mg by mouth at bedtime as needed (sleep).      Multiple Vitamin (MULTIVITAMIN) tablet Take 1 tablet by mouth daily.      Polyethyl Glycol-Propyl Glycol (SYSTANE OP) Place 1 drop into both eyes daily as needed (dry eyes).      traMADol (ULTRAM) 50 MG tablet Take 1 tablet (50 mg total) by mouth 2 (two) times daily as needed. (Patient taking differently: Take 50 mg by mouth 2 (two) times daily as needed for moderate pain. ) 30 tablet 1    Nebulizers (COMPRESSOR/NEBULIZER) MISC Use as directed 1 each prn     Current Facility-Administered Medications  Medication Dose Route Frequency Provider Last Rate Last Admin   acetaminophen (TYLENOL) tablet 1,000 mg  1,000 mg Oral On Call to OR Michael Boston, MD       bupivacaine liposome (EXPAREL) 1.3 % injection 266 mg  20 mL Infiltration Once Michael Boston, MD       ceFAZolin (ANCEF) IVPB 2g/100 mL premix  2 g Intravenous On Call to OR Michael Boston, MD       chlorhexidine (PERIDEX) 0.12 % solution 15 mL  15 mL Mouth/Throat Once Duane Boston, MD       Or   MEDLINE mouth rinse  15 mL Mouth Rinse Once Duane Boston, MD       Chlorhexidine Gluconate Cloth 2 % PADS 6 each  6 each Topical Once Michael Boston, MD       And   Chlorhexidine Gluconate Cloth 2 % PADS 6 each  6 each Topical Once Liam Cammarata, Remo Lipps, MD       dextrose 50 % solution 12.5 g  12.5 g Intravenous Once Belinda Block, MD       [START ON 06/07/2020] feeding supplement (ENSURE PRE-SURGERY) liquid 296 mL  296 mL Oral Once Michael Boston, MD       feeding supplement (ENSURE PRE-SURGERY) liquid 592 mL  592 mL Oral Once Michael Boston, MD       gabapentin (NEURONTIN) capsule 300 mg  300 mg Oral On Call to OR Michael Boston, MD       lactated ringers infusion   Intravenous Continuous Duane Boston, MD         No Known Allergies  BP (!) 172/88    Pulse 75    Temp 98.2 F (36.8 C) (Oral)    Resp 20    SpO2 97%   Labs: Results for orders placed or performed during the hospital encounter of 06/06/20 (from the past 48 hour(s))  Glucose, capillary     Status: None   Collection Time: 06/06/20 11:37 AM  Result Value Ref Range   Glucose-Capillary 76 70 - 99 mg/dL    Comment: Glucose reference range applies only to samples taken after fasting for at least 8 hours.    Imaging / Studies: No results found.   [name], M.D., F.A.C.S. Gastrointestinal and Minimally Invasive Surgery [facility] [address], [place], [place] [address] [phone] Main / Paging  06/06/2020 12:15 PM    [name]

## 2020-06-06 NOTE — Anesthesia Procedure Notes (Signed)
Procedure Name: Intubation Performed by: Rosaland Lao, CRNA Pre-anesthesia Checklist: Patient identified, Emergency Drugs available, Suction available and Patient being monitored Patient Re-evaluated:Patient Re-evaluated prior to induction Oxygen Delivery Method: Circle system utilized Preoxygenation: Pre-oxygenation with 100% oxygen Induction Type: IV induction and Rapid sequence Laryngoscope Size: Mac and 4 Grade View: Grade I Tube type: Oral Tube size: 7.5 mm Number of attempts: 1 Airway Equipment and Method: Stylet and Oral airway Placement Confirmation: ETT inserted through vocal cords under direct vision,  positive ETCO2 and breath sounds checked- equal and bilateral Tube secured with: Tape Dental Injury: Teeth and Oropharynx as per pre-operative assessment

## 2020-06-06 NOTE — Progress Notes (Signed)
Pt declined nocturnal cpap stating that she does not use cpap at home.  RN aware.  Pt was advised that RT is available all night should she change her mind.

## 2020-06-06 NOTE — Anesthesia Preprocedure Evaluation (Signed)
Anesthesia Evaluation  Patient identified by MRN, date of birth, ID band Patient awake    Reviewed: Allergy & Precautions, NPO status , Patient's Chart, lab work & pertinent test results  Airway Mallampati: II  TM Distance: >3 FB     Dental   Pulmonary shortness of breath, sleep apnea , COPD, former smoker,    breath sounds clear to auscultation       Cardiovascular negative cardio ROS   Rhythm:Regular Rate:Normal     Neuro/Psych    GI/Hepatic negative GI ROS, Neg liver ROS,   Endo/Other  diabetesHypothyroidism   Renal/GU negative Renal ROS     Musculoskeletal   Abdominal   Peds  Hematology   Anesthesia Other Findings   Reproductive/Obstetrics                             Anesthesia Physical Anesthesia Plan  ASA: III  Anesthesia Plan: General   Post-op Pain Management:    Induction: Intravenous  PONV Risk Score and Plan: 3 and Ondansetron, Dexamethasone and Midazolam  Airway Management Planned: Oral ETT  Additional Equipment:   Intra-op Plan:   Post-operative Plan: Extubation in OR  Informed Consent: I have reviewed the patients History and Physical, chart, labs and discussed the procedure including the risks, benefits and alternatives for the proposed anesthesia with the patient or authorized representative who has indicated his/her understanding and acceptance.     Dental advisory given  Plan Discussed with: CRNA and Anesthesiologist  Anesthesia Plan Comments:         Anesthesia Quick Evaluation

## 2020-06-06 NOTE — Anesthesia Procedure Notes (Signed)
Date/Time: 06/06/2020 5:14 PM Performed by: Cynda Familia, CRNA Oxygen Delivery Method: Simple face mask Placement Confirmation: positive ETCO2 and breath sounds checked- equal and bilateral Dental Injury: Teeth and Oropharynx as per pre-operative assessment

## 2020-06-06 NOTE — Op Note (Addendum)
06/06/2020  PATIENT:  [name]  74 y.o. female  Patient Care Team: Seward Carol, MD as PCP - General (Internal Medicine) Marybelle Killings, MD (Orthopedic Surgery) Haygood, Seymour Bars, MD (Inactive) (Obstetrics and Gynecology) Deneise Lever, MD (Pulmonary Disease) Volanda Napoleon, MD (Internal Medicine) Michael Boston, MD as Consulting Physician (General Surgery)  PRE-OPERATIVE DIAGNOSIS:  INCISIONAL INCARCERATED ABDOMINAL WALL HERNIA  MASS ON RIGHT BUTTOCK  POST-OPERATIVE DIAGNOSIS: INCISIONAL INCARCERATED ABDOMINAL WALL HERNIA SUBCUTANEOUS MASS ON RIGHT BUTTOCK  PROCEDURE:   LAPAROSCOPIC REPAIR OF  INCISIONAL INCARCERATED ABDOMINAL WALL HERNIA WITH MESH EXCISION OF RIGHT POSTERIOR HIP SUBCUTANEOUS MASS TAP BLOCK - BILATERAL  SURGEON:  [name], MD  ASSISTANT: Sheria Lang, MD, PGY 7, [employer]  ANESTHESIA:     General  Nerve block provided with liposomal bupivacaine (Experel) mixed with 0.25% bupivacaine as a Bilateral TAP block x 32mL each side at the level of the transverse abdominis & preperitoneal spaces along the flank at the anterior axillary line, from subcostal ridge to iliac crest under laparoscopic guidance   EBL:  Total I/O In: 1250 [I.V.:1000; IV Piggyback:250] Out: 67 [Blood:50]  Per anesthesia record  Delay start of Pharmacological VTE agent (>24hrs) due to surgical blood loss or risk of bleeding:  no  DRAINS: none   SPECIMEN:  No Specimen  DISPOSITION OF SPECIMEN:  N/A  COUNTS:  YES  PLAN OF CARE: Admit for overnight observation  PATIENT DISPOSITION:  PACU - hemodynamically stable.  INDICATION: Pleasant patient has developed a ventral wall abdominal hernia. Recommendation was made for surgical repair  The anatomy & physiology of the abdominal wall was discussed. The pathophysiology of hernias was discussed. Natural history risks without surgery including progeressive enlargement, pain, incarceration & strangulation was  discussed. Contributors to complications such as smoking, obesity, diabetes, prior surgery, etc were discussed.  I feel the risks of no intervention will lead to serious problems that outweigh the operative risks; therefore, I recommended surgery to reduce and repair the hernia. I explained laparoscopic techniques with possible need for an open approach. I noted the probable use of mesh to patch and/or buttress the hernia repair.  Risks such as bleeding, infection, abscess, need for further treatment, heart attack, death, and other risks were discussed. I noted a good likelihood this will help address the problem. Goals of post-operative recovery were discussed as well. Possibility that this will not correct all symptoms was explained. I stressed the importance of low-impact activity, aggressive pain control, avoiding constipation, & not pushing through pain to minimize risk of post-operative chronic pain or injury. Possibility of reherniation especially with smoking, obesity, diabetes, immunosuppression, and other health conditions was discussed. We will work to minimize complications.   An educational handout further explaining the pathology & treatment options was given as well. Questions were answered. The patient expresses understanding & wishes to proceed with surgery.   Patient also with right posterior hip mass and soft tissues.  Most likely lipoma.  Increasing in size with discomfort.  She was to have excised.  The pathophysiology of skin & subcutaneous masses was discussed.  Natural history risks without surgery were discussed.  I recommended surgery to remove the mass.  I explained the technique of removal with use of local anesthesia & possible need for more aggressive sedation/anesthesia for patient comfort.    Risks such as bleeding, infection, wound breakdown, heart attack, death, and other risks were discussed.  I noted a good likelihood this will help address the problem.  Possibility  that this will not correct all symptoms was explained. Possibility of regrowth/recurrence of the mass was discussed.  We will work to minimize complications. Questions were answered.  The patient expresses understanding & wishes to proceed with surgery.   OR FINDINGS: 12 x 8 x 6 cm ellipsoid mass in the soft tissues of right posterior hip well encapsulated can most likely consistent with lipoma.  Supra and periumbilical incisional hernias incarcerated with omentum.  7 x 4 cm region.  Type of repair: Laparoscopic underlay repair.  Primary repair of hernias   Placement of mesh: Centrally intraperitoneal with edges tucked into RECTRORECTUS & preperitoneal space  Name of mesh: Bard Ventralight dual sided (polypropylene / Seprafilm)  Size of mesh: 25x20cm  Orientation: Transverse  Mesh overlap:  5-7cm   DESCRIPTION:   Informed consent was confirmed. The patient underwent general anaesthesia without difficulty. The patient was positioned appropriately. VTE prevention in place.   Addition the patient right side up with a bump in decubitus fashion.  Right groin hip and buttocks was prepped and draped in a sterile fashion.  Can feel the ellipsoid deep soft tissue mass posterior to the hip.  Made a transverse incision over the mass and came through dermis and subcutaneous tissues.  Encountered a well encapsulated mass.  Able to come around it using careful finger fracture and focused cautery dissection.  Eventually able to enucleated off of the right posterior hip soft tissues.  Radicular specimens.  We assured hemostasis.  Given the large cavity defect I placed a 18 [place] Blake drain through a right lower quadrant incision over the right lateral hip into the right posterior soft tissues.  Secured the skin with 2-0 Prolene suture.  Defect closed with 2-0 Vicryl interrupted suture at Scarpa's fascia.  2-0 Vicryl deep dermal interrupted sutures.  3-0 Monocryl running suture.  Sterile dressing  applied.  Next we focused on repair of the incarcerated periumbilical abdominal incisional hernia.  The patient's abdomen was clipped, prepped, & draped in a sterile fashion. Surgical timeout confirmed our plan.  The patient was positioned in reverse Trendelenburg. Abdominal entry was gained using optical entry technique in the left upper abdomen. Entry was clean. I induced carbon dioxide insufflation. Camera inspection revealed no injury. Extra ports were carefully placed under direct laparoscopic visualization.   I could see adhesions on the parietal peritoneum under the abdominal wall.  We did laparoscopic lysis of adhesions to expose the entire anterior abdominal wall.  I primarily used focused sharp dissection.  I was able to eventually reduce a moderate swath of omentum out of the supraumbilical and periumbilical incisional hernias.  Mild transverse colon was nearby, and had not gotten up into the hernias themselves.  A few epiploic appendages though.  Eventually reduced and assured hemostasis.  I freed off the falciform ligament and central peritoneum to expose the retrorectus fascia   I made sure hemostasis was good.  I mapped out the region using a needle passer.   To ensure that I would have at least 5 cm radial coverage outside of the hernia defect, I chose a 25x20cm dual sided mesh.  I placed #1 Prolene stitches around its edge about every 5 cm = 12 total.  I rolled the mesh & placed into the peritoneal cavity through the supraumbilical hernia defect.  I unrolled the mesh and positioned it appropriately.  I secured the mesh to cover up the hernia defect using a laparoscopic suture passer to pass the tails of the Prolene  through the abdominal wall & tagged them with clamps for good transfascial suturing.  I started out in four corners to make sure I had the mesh centered under the hernia defect appropriately, and then proceeded to work in quadrants.    We evacuated CO2 & desufflated the abdomen.   I tied the fascial stitches down. I closed the fascial defect that I placed the mesh through using #1 PDS interrupted transverse stitches primarily.  I reinsufflated the abdomen. The mesh provided at least circumferential coverage around the entire region of hernia defects.   I secured the mesh centrally with an additional trans fascial stitch in & out the mesh using #1 PDS under laparoscopic visualization.   I tacked the edges & central part of the mesh to the peritoneum/posterior rectus fascia with SecureStrap absorbable tacks.   I did reinspection. Hemostasis was good. Mesh laid well. I completed a broad field block of local anesthesia at fascial stitch sites & fascial closure areas.    Capnoperitoneum was evacuated. Ports were removed. The skin was closed with Monocryl at the port sites and Steri-Strips on the fascial stitch puncture sites.  Patient is being extubated to go to the recovery room.  I discussed operative findings, updated the patient's status, discussed probable steps to recovery, and gave postoperative recommendations to the patient's daughter, [name].  Recommendations were made.  Questions were answered.  She expressed understanding & appreciation.  I was personally present during the key and critical portions of this procedure and immediately available throughout the entire procedure, as documented in my operative note.   [name], M.D., F.A.C.S. Gastrointestinal and Minimally Invasive Surgery [facility] [address], [place], [place] [address] [phone] Main / Paging  06/06/2020 4:58 PM

## 2020-06-07 ENCOUNTER — Other Ambulatory Visit: Payer: Self-pay

## 2020-06-07 ENCOUNTER — Encounter (HOSPITAL_COMMUNITY): Payer: Self-pay | Admitting: Surgery

## 2020-06-07 DIAGNOSIS — Z9884 Bariatric surgery status: Secondary | ICD-10-CM | POA: Diagnosis not present

## 2020-06-07 DIAGNOSIS — Z79899 Other long term (current) drug therapy: Secondary | ICD-10-CM | POA: Diagnosis not present

## 2020-06-07 DIAGNOSIS — K430 Incisional hernia with obstruction, without gangrene: Secondary | ICD-10-CM | POA: Diagnosis not present

## 2020-06-07 DIAGNOSIS — Z87891 Personal history of nicotine dependence: Secondary | ICD-10-CM | POA: Diagnosis not present

## 2020-06-07 DIAGNOSIS — Z85118 Personal history of other malignant neoplasm of bronchus and lung: Secondary | ICD-10-CM | POA: Diagnosis not present

## 2020-06-07 DIAGNOSIS — K581 Irritable bowel syndrome with constipation: Secondary | ICD-10-CM | POA: Diagnosis not present

## 2020-06-07 DIAGNOSIS — K648 Other hemorrhoids: Secondary | ICD-10-CM | POA: Diagnosis not present

## 2020-06-07 DIAGNOSIS — J449 Chronic obstructive pulmonary disease, unspecified: Secondary | ICD-10-CM | POA: Diagnosis not present

## 2020-06-07 DIAGNOSIS — Z9981 Dependence on supplemental oxygen: Secondary | ICD-10-CM | POA: Diagnosis not present

## 2020-06-07 DIAGNOSIS — D171 Benign lipomatous neoplasm of skin and subcutaneous tissue of trunk: Secondary | ICD-10-CM | POA: Diagnosis not present

## 2020-06-07 NOTE — Progress Notes (Signed)
Pt alert and oriented, tolerating diet. D/C instructions given, instructed on how to empty drain. Pt d/cd home.

## 2020-06-07 NOTE — Discharge Summary (Signed)
Physician Discharge Summary    Patient ID: [name] MRN: [id number] DOB/AGE: 1945/11/28  74 y.o.  Patient Care Team: Seward Carol, MD as PCP - General (Internal Medicine) Marybelle Killings, MD (Orthopedic Surgery) Leo Grosser Seymour Bars, MD (Inactive) (Obstetrics and Gynecology) Deneise Lever, MD (Pulmonary Disease) Volanda Napoleon, MD (Internal Medicine) Michael Boston, MD as Consulting Physician (General Surgery)  Admit date: 06/06/2020  Discharge date: 06/07/2020  Hospital Stay = 0 days    Discharge Diagnoses:  Active Problems:   COPD mixed type ([place])   History of lung cancer   Obesity, morbid ([place])   Obstructive sleep apnea   Incarcerated incisional hernia s/p lap repair w mesh 06/06/2020   1 Day Post-Op  06/06/2020  POST-OPERATIVE DIAGNOSIS: INCISIONAL INCARCERATED ABDOMINAL WALL HERNIA SUBCUTANEOUS MASS ON RIGHT BUTTOCK  PROCEDURE:   LAPAROSCOPIC REPAIR OF  INCISIONAL INCARCERATED ABDOMINAL WALL HERNIA WITH MESH EXCISION OF RIGHT POSTERIOR HIP SUBCUTANEOUS MASS TAP BLOCK - BILATERAL  SURGEON:  [name], MD  Consults: None  Hospital Course:   The patient underwent the surgery above.  Postoperatively, the patient gradually mobilized and advanced to a solid diet.  Pain and other symptoms were treated aggressively.    By the time of discharge, the patient was walking well the hallways, eating food, having flatus.  Pain was well-controlled on an oral medications.  Based on meeting discharge criteria and continuing to recover, I felt it was safe for the patient to be discharged from the hospital to further recover with close followup. Postoperative recommendations were discussed in detail.  They are written as well.  Discharged Condition: good  Discharge Exam: Blood pressure 98/68, pulse 65, temperature 98.2 F (36.8 C), temperature source Oral, resp. rate 19, SpO2 100 %.  General: Pt awake/alert/oriented x4 in No acute distress Eyes:  PERRL, normal EOM.  Sclera clear.  No icterus Neuro: CN II-XII intact w/o focal sensory/motor deficits. Lymph: No head/neck/groin lymphadenopathy Psych:  No delerium/psychosis/paranoia HENT: Normocephalic, Mucus membranes moist.  No thrush Neck: Supple, No tracheal deviation Chest: No chest wall pain w good excursion CV:  Pulses intact.  Regular rhythm MS: Normal AROM mjr joints.  No obvious deformity Abdomen: Soft.  Nondistended.  Mildly tender at incisions only.  No evidence of peritonitis.  No incarcerated hernias. Ext:  SCDs BLE.  No mjr edema.  No cyanosis.  Rightr buttock dressing c/d/i.  Drain serosanguinous Skin: No petechiae / purpura   Disposition:    Follow-up Information    Schedule an appointment as soon as possible for a visit with Michael Boston, MD.   Specialty: General Surgery Why: To follow up after your operation, To follow up after your hospital stay Contact information: [place] [address] [phone]        [facility], [place]. Schedule an appointment as soon as possible for a visit in 10 day(s).   Specialty: General Surgery Why: come to office to have your drain removed the week after Thanksgiviing (as long as the output from the drain is <30 mL a day) Contact information: [address] [place] [phone]              Discharge disposition: 01-Home or Self Care       Discharge Instructions    Call MD for:   Complete by: As directed    FEVER > 101.5 F  (temperatures < 101.5 F are not significant)   Call MD  for:  extreme fatigue   Complete by: As directed    Call MD for:  persistant dizziness or light-headedness   Complete by: As directed    Call MD for:  persistant nausea and vomiting   Complete by: As directed    Call MD for:  redness, tenderness, or signs of infection (pain, swelling, redness, odor or green/yellow discharge around incision site)    Complete by: As directed    Call MD for:  severe uncontrolled pain   Complete by: As directed    Diet - low sodium heart healthy   Complete by: As directed    Start with a bland diet such as soups, liquids, starchy foods, low fat foods, etc. the first few days at home. Gradually advance to a solid, low-fat, high fiber diet by the end of the first week at home.   Add a fiber supplement to your diet (Metamucil, etc) If you feel full, bloated, or constipated, stay on a full liquid or pureed/blenderized diet for a few days until you feel better and are no longer constipated.   Discharge instructions   Complete by: As directed    See Discharge Instructions If you are not getting better after two weeks or are noticing you are getting worse, contact our office [phone] for further advice.  We may need to adjust your medications, re-evaluate you in the office, send you to the emergency room, or see what other things we can do to help. The clinic staff is available to answer your questions during regular business hours (8:30am-5pm).  Please don't hesitate to call and ask to speak to one of our nurses for clinical concerns.    A surgeon from [facility] is always on call at the hospitals 24 hours/day If you have a medical emergency, go to the nearest emergency room or call 911.   Discharge wound care:   Complete by: As directed    It is good for closed incisions and even open wounds to be washed every day.  Shower every day.  Short baths are fine.  Wash the incisions and wounds clean with soap & water.    You may leave closed incisions open to air if it is dry.   You may cover the incision with clean gauze & replace it after your daily shower for comfort.  TEGADERM:  You have clear gauze band-aid dressings over your closed incision(s).  Remove the dressings 3 days after surgery.  STERISTRIPS:  You have skin tapes called Steristrips on your incisions.  Leave them in place, and they  will fall off on their own like a scab in 1-3 weeks.  You may trim any edges that curl up with clean scissors.  You may remove them in the shower in a week.  DRAIN:  You have a drain in place.  Every day change the dressing in the shower, wash around the skin exit site with soap & water and place a new dressing of gauze or band aid around the skin every day.  Keep the drain site clean & dry.  Follow up in our surgery office to discuss plans for drain removal in the near future   Driving Restrictions   Complete by: As directed    You may drive when: - you are no longer taking narcotic prescription pain medication - you can comfortably wear a seatbelt - you can safely make sudden turns/stops without pain.   Increase activity slowly   Complete by: As  directed    Start light daily activities --- self-care, walking, climbing stairs- beginning the day after surgery.  Gradually increase activities as tolerated.  Control your pain to be active.  Stop when you are tired.  Ideally, walk several times a day, eventually an hour a day.   Most people are back to most day-to-day activities in a few weeks.  It takes 4-6 weeks to get back to unrestricted, intense activity. If you can walk 30 minutes without difficulty, it is safe to try more intense activity such as jogging, treadmill, bicycling, low-impact aerobics, swimming, etc. Save the most intensive and strenuous activity for last (Usually 4-8 weeks after surgery) such as sit-ups, heavy lifting, contact sports, etc.  Refrain from any intense heavy lifting or straining until you are off narcotics for pain control.  You will have off days, but things should improve week-by-week. DO NOT PUSH THROUGH PAIN.  Let pain be your guide: If it hurts to do something, don't do it.   Lifting restrictions   Complete by: As directed    If you can walk 30 minutes without difficulty, it is safe to try more intense activity such as jogging, treadmill, bicycling, low-impact  aerobics, swimming, etc. Save the most intensive and strenuous activity for last (Usually 4-8 weeks after surgery) such as sit-ups, heavy lifting, contact sports, etc.   Refrain from any intense heavy lifting or straining until you are off narcotics for pain control.  You will have off days, but things should improve week-by-week. DO NOT PUSH THROUGH PAIN.  Let pain be your guide: If it hurts to do something, don't do it.  Pain is your body warning you to avoid that activity for another week until the pain goes down.   May shower / Bathe   Complete by: As directed    May walk up steps   Complete by: As directed    Remove dressing in 72 hours   Complete by: As directed    Make sure all dressings are removed by the third day after surgery.  Leave incisions open to air.  OK to cover incisions with gauze or bandages as desired   Sexual Activity Restrictions   Complete by: As directed    You may have sexual intercourse when it is comfortable. If it hurts to do something, stop.      Allergies as of 06/07/2020   No Known Allergies     Medication List    TAKE these medications   acetaminophen 650 MG CR tablet Commonly known as: TYLENOL Take 1,300 mg by mouth every 8 (eight) hours as needed for pain.   albuterol (2.5 MG/3ML) 0.083% nebulizer solution Commonly known as: PROVENTIL Take 3 mLs (2.5 mg total) by nebulization every 8 (eight) hours as needed for wheezing or shortness of breath.   albuterol 108 (90 Base) MCG/ACT inhaler Commonly known as: VENTOLIN HFA Inhale 2 puffs into the lungs every 6 (six) hours as needed for wheezing or shortness of breath.   aspirin EC 81 MG tablet Take 81 mg by mouth daily. Swallow whole.   Biotin 5000 MCG Tabs Take 5,000 mcg by mouth daily.   CALCIUM 500 PO Take 1,500 mg by mouth daily.   calcium carbonate 500 MG chewable tablet Commonly known as: TUMS - dosed in mg elemental calcium Chew 2 tablets by mouth daily as needed for indigestion or  heartburn.   cetirizine 10 MG tablet Commonly known as: ZYRTEC Take 10 mg by mouth daily.  Compressor/Nebulizer Misc Use as directed   diclofenac Sodium 1 % Gel Commonly known as: VOLTAREN Apply 1 application topically 4 (four) times daily as needed (pain).   diphenhydrAMINE 25 mg capsule Commonly known as: BENADRYL Take 50 mg by mouth daily.   docusate sodium 100 MG capsule Commonly known as: COLACE Take 200 mg by mouth at bedtime.   Fluticasone-Salmeterol 100-50 MCG/DOSE Aepb Commonly known as: Advair Diskus Inhale 1 puff then rinse mouth, twice daily What changed:   how much to take  how to take this  when to take this  additional instructions   glucose 4 GM chewable tablet Chew 20-24 g by mouth as needed for low blood sugar.   HYDROcodone-Chlorpheniramine 5-4 MG/5ML Soln 5 ml every 6 hours if needed for cough What changed:   how much to take  how to take this  when to take this  reasons to take this  additional instructions   LEG CRAMPS PO Take 2 tablets by mouth daily.   Linzess 72 MCG capsule Generic drug: linaclotide Take 72 mcg by mouth daily before breakfast.   Magnesium 400 MG Tabs Take 400 mg by mouth 2 (two) times daily.   Melatonin 10 MG Caps Take 10 mg by mouth at bedtime as needed (sleep).   multivitamin tablet Take 1 tablet by mouth daily.   SYSTANE OP Place 1 drop into both eyes daily as needed (dry eyes).   traMADol 50 MG tablet Commonly known as: ULTRAM Take 1 tablet (50 mg total) by mouth every 6 (six) hours as needed for moderate pain or severe pain. What changed:   when to take this  reasons to take this            Discharge Care Instructions  (From admission, onward)         Start     Ordered   06/06/20 0000  Discharge wound care:       Comments: It is good for closed incisions and even open wounds to be washed every day.  Shower every day.  Short baths are fine.  Wash the incisions and wounds clean  with soap & water.    You may leave closed incisions open to air if it is dry.   You may cover the incision with clean gauze & replace it after your daily shower for comfort.  TEGADERM:  You have clear gauze band-aid dressings over your closed incision(s).  Remove the dressings 3 days after surgery.  STERISTRIPS:  You have skin tapes called Steristrips on your incisions.  Leave them in place, and they will fall off on their own like a scab in 1-3 weeks.  You may trim any edges that curl up with clean scissors.  You may remove them in the shower in a week.  DRAIN:  You have a drain in place.  Every day change the dressing in the shower, wash around the skin exit site with soap & water and place a new dressing of gauze or band aid around the skin every day.  Keep the drain site clean & dry.  Follow up in our surgery office to discuss plans for drain removal in the near future   06/06/20 1713          Significant Diagnostic Studies:  Results for orders placed or performed during the hospital encounter of 06/06/20 (from the past 72 hour(s))  Glucose, capillary     Status: None   Collection Time: 06/06/20 11:37 AM  Result Value  Ref Range   Glucose-Capillary 76 70 - 99 mg/dL    Comment: Glucose reference range applies only to samples taken after fasting for at least 8 hours.  Glucose, capillary     Status: Abnormal   Collection Time: 06/06/20 12:46 PM  Result Value Ref Range   Glucose-Capillary 132 (H) 70 - 99 mg/dL    Comment: Glucose reference range applies only to samples taken after fasting for at least 8 hours.   Comment 1 Notify RN    Comment 2 Document in Chart   Glucose, capillary     Status: Abnormal   Collection Time: 06/06/20  5:37 PM  Result Value Ref Range   Glucose-Capillary 183 (H) 70 - 99 mg/dL    Comment: Glucose reference range applies only to samples taken after fasting for at least 8 hours.    No results found.  Past Medical History:  Diagnosis Date   Abdominal  pain    around naval   Arthritis    Breast pain in female    Bruises easily    Cancer ([place]) 2003   lung   Cervical disc disease    COPD (chronic obstructive pulmonary disease) ([place])    O2 as needed.   DJD (degenerative joint disease)    Dyspnea    Gum disease    Hernia    Hypoglycemic episode in patient with diabetes mellitus ([place]) 2021   Diet controlled diabetes with occasional hypoglycemia at night   Hypothyroidism    no meds   Insomnia    On home O2    as needed   Phlebitis    Pre-diabetes    Thyroid disease    Wears glasses    Weight increase     Past Surgical History:  Procedure Laterality Date   ABDOMINAL HYSTERECTOMY     partial   FOOT SURGERY     right   LAPAROSCOPIC GASTRIC BANDING  Feb 2010   LAPAROSCOPIC ROUX-EN-Y GASTRIC BYPASS WITH UPPER ENDOSCOPY AND REMOVAL OF LAP BAND  2018   LEFT HEART CATHETERIZATION WITH CORONARY ANGIOGRAM N/A 05/02/2012   Procedure: LEFT HEART CATHETERIZATION WITH CORONARY ANGIOGRAM;  Surgeon: Laverda Page, MD;  Location[facility] CATH LAB;  Service: Cardiovascular;  Laterality: N/A;   LUNG LOBECTOMY  2003   left   SPINE SURGERY     fusion on the neck   THYROIDECTOMY     partial   TONSILLECTOMY      Social History   Socioeconomic History   Marital status: Divorced    Spouse name: Not on file   Number of children: Not on file   Years of education: Not on file   Highest education level: Not on file  Occupational History   Not on file  Tobacco Use   Smoking status: Former Smoker    Packs/day: 2.00    Years: 37.00    Pack years: 74.00    Types: Cigarettes    Quit date: 06/21/2002    Years since quitting: 17.9   Smokeless tobacco: Never Used  Vaping Use   Vaping Use: Never used  Substance and Sexual Activity   Alcohol use: No   Drug use: No   Sexual activity: Not on file  Other Topics Concern   Not on file  Social History Narrative   Not on file   Social Determinants of  Health   Financial Resource Strain:    Difficulty of Paying Living Expenses: Not on file  Food Insecurity:    Worried  About Running Out of Food in the Last Year: Not on file   Ran Out of Food in the Last Year: Not on file  Transportation Needs:    Lack of Transportation (Medical): Not on file   Lack of Transportation (Non-Medical): Not on file  Physical Activity:    Days of Exercise per Week: Not on file   Minutes of Exercise per Session: Not on file  Stress:    Feeling of Stress : Not on file  Social Connections:    Frequency of Communication with Friends and Family: Not on file   Frequency of Social Gatherings with Friends and Family: Not on file   Attends Religious Services: Not on file   Active Member of Clubs or Organizations: Not on file   Attends Archivist Meetings: Not on file   Marital Status: Not on file  Intimate Partner Violence:    Fear of Current or Ex-Partner: Not on file   Emotionally Abused: Not on file   Physically Abused: Not on file   Sexually Abused: Not on file    Family History  Problem Relation Age of Onset   Stroke Mother    Stroke Father     Current Facility-Administered Medications  Medication Dose Route Frequency Provider Last Rate Last Admin   0.9 %  sodium chloride infusion   Intravenous Q8H PRN Michael Boston, MD       0.9 %  sodium chloride infusion  250 mL Intravenous PRN Michael Boston, MD       acetaminophen (TYLENOL) tablet 1,000 mg  1,000 mg Oral Q6H Michael Boston, MD   1,000 mg at 06/07/20 0546   albuterol (PROVENTIL) (2.5 MG/3ML) 0.083% nebulizer solution 2.5 mg  2.5 mg Nebulization Q8H PRN Michael Boston, MD       albuterol (VENTOLIN HFA) 108 (90 Base) MCG/ACT inhaler 2 puff  2 puff Inhalation Q6H PRN Michael Boston, MD       aspirin EC tablet 81 mg  81 mg Oral Daily Michael Boston, MD       bisacodyl (DULCOLAX) suppository 10 mg  10 mg Rectal Daily PRN Michael Boston, MD       calcium carbonate (TUMS  - dosed in mg elemental calcium) chewable tablet 400 mg of elemental calcium  2 tablet Oral Daily PRN Michael Boston, MD       diclofenac Sodium (VOLTAREN) 1 % topical gel 1 application  1 application Topical QID PRN Trudi Morgenthaler, Remo Lipps, MD       diphenhydrAMINE (BENADRYL) 12.5 MG/5ML elixir 12.5 mg  12.5 mg Oral Q6H PRN Michael Boston, MD       Or   diphenhydrAMINE (BENADRYL) injection 12.5 mg  12.5 mg Intravenous Q6H PRN Elania Crowl, Remo Lipps, MD       diphenhydrAMINE (BENADRYL) capsule 50 mg  50 mg Oral Ardeen Fillers, MD   50 mg at 06/06/20 2237   docusate sodium (COLACE) capsule 200 mg  200 mg Oral Ardeen Fillers, MD   200 mg at 06/06/20 2237   enoxaparin (LOVENOX) injection 40 mg  40 mg Subcutaneous Q24H Michael Boston, MD       gabapentin (NEURONTIN) capsule 300 mg  300 mg Oral BID Michael Boston, MD   300 mg at 06/06/20 2237   glucose chewable tablet 20-24 g  20-24 g Oral PRN Michael Boston, MD       lactated ringers bolus 1,000 mL  1,000 mL Intravenous Q8H PRN Michael Boston, MD       lactated  ringers infusion   Intravenous Continuous Michael Boston, MD 50 mL/hr at 06/06/20 1958 New Bag at 06/06/20 1958   linaclotide (LINZESS) capsule 72 mcg  72 mcg Oral QAC breakfast Michael Boston, MD       lip balm (CARMEX) ointment 1 application  1 application Topical BID Michael Boston, MD   1 application at 64/68/03 2100   loratadine (CLARITIN) tablet 10 mg  10 mg Oral Daily Michael Boston, MD       magic mouthwash  15 mL Oral QID PRN Michael Boston, MD       magnesium hydroxide (MILK OF MAGNESIA) suspension 30 mL  30 mL Oral Daily PRN Michael Boston, MD       magnesium oxide (MAG-OX) tablet 400 mg  400 mg Oral BID Michael Boston, MD   400 mg at 06/06/20 2237   melatonin tablet 10 mg  10 mg Oral QHS PRN Michael Boston, MD       methocarbamol (ROBAXIN) 1,000 mg in dextrose 5 % 100 mL IVPB  1,000 mg Intravenous Q6H PRN Michael Boston, MD       methocarbamol (ROBAXIN) tablet 1,000 mg  1,000 mg Oral Q6H  PRN Michael Boston, MD       methocarbamol (ROBAXIN) tablet 500 mg  500 mg Oral Q6H PRN Michael Boston, MD       metoprolol tartrate (LOPRESSOR) injection 5 mg  5 mg Intravenous Q6H PRN Michael Boston, MD       mometasone-formoterol (DULERA) 100-5 MCG/ACT inhaler 2 puff  2 puff Inhalation BID Michael Boston, MD   2 puff at 06/06/20 1951   multivitamin with minerals tablet 1 tablet  1 tablet Oral Daily Michael Boston, MD       oxyCODONE (Oxy IR/ROXICODONE) immediate release tablet 5-10 mg  5-10 mg Oral Q4H PRN Michael Boston, MD       polyvinyl alcohol (LIQUIFILM TEARS) 1.4 % ophthalmic solution   Both Eyes Daily PRN Michael Boston, MD       psyllium (HYDROCIL/METAMUCIL) 1 packet  1 packet Oral BID Michael Boston, MD   1 packet at 06/06/20 2237   simethicone (MYLICON) chewable tablet 40 mg  40 mg Oral Q6H PRN Michael Boston, MD       sodium chloride flush (NS) 0.9 % injection 3 mL  3 mL Intravenous Gorden Harms, MD   3 mL at 06/06/20 2238   sodium chloride flush (NS) 0.9 % injection 3 mL  3 mL Intravenous PRN Michael Boston, MD       traMADol Veatrice Bourbon) tablet 50 mg  50 mg Oral Q6H PRN Michael Boston, MD         No Known Allergies  Signed: Morton Peters, MD, FACS, MASCRS Gastrointestinal and Minimally Invasive Surgery  [facility] [address], [place], [place] [address] [phone] Fax [phone] Main/Paging  CONTACT INFORMATION: Weekday (9AM-5PM) concerns: Call [facility] main office at [phone] Weeknight (5PM-9AM) or Weekend/Holiday concerns: Check [email or web address] for General Surgery [facility] coverage (Please, do not use SecureChat as it is not reliable communication to operating surgeons for immediate patient care)      06/07/2020, 8:32 AM

## 2020-06-07 NOTE — Progress Notes (Signed)
Responded to consult for IV. Pt requests to hold off on starting IV due to "not using it" for anything. Noted current IV medications are prn and pt states she has not needed them. RN notified.

## 2020-06-10 LAB — SURGICAL PATHOLOGY

## 2020-06-17 NOTE — Anesthesia Postprocedure Evaluation (Signed)
Anesthesia Post Note  Patient: [name]  Procedure(s) Performed: LAPAROSCOPIC INCARCERATED INCISIONAL HERNIA REPAIR WITH MESH AND TAP BLOCK (N/A Abdomen) REMOVAL MASS ON RIGHT BUTTOCK (Right Hip)     Patient location during evaluation: PACU Anesthesia Type: General Level of consciousness: awake Pain management: pain level controlled Vital Signs Assessment: post-procedure vital signs reviewed and stable Respiratory status: spontaneous breathing Cardiovascular status: stable Postop Assessment: no apparent nausea or vomiting Anesthetic complications: no   No complications documented.  Last Vitals:  Vitals:   06/07/20 0607 06/07/20 0857  BP: 98/68   Pulse: 65   Resp: 19   Temp: 36.8 C   SpO2: 100% 93%    Last Pain:  Vitals:   06/07/20 0607  TempSrc: Oral  PainSc:                  Felisia Balcom

## 2020-07-31 NOTE — Progress Notes (Deleted)
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 FEV1 1.75/87%, FEV1/FVC 0.53, DLCO 49 Office Spirometry 07/04/2015-mild airway obstruction. FEV1/FVC 0.55 Office Spirometry 03/06/2016- mild obstructive airways disease-FVC 2.01/104%, FEV1 1.16/78%, ratio 0.58, FEF 25-75% 0.61/ 43% NPSG 07/03/13- AHI 5.3/ hr, weight 234 pounds PFT 05/03/17-mild obstructive airways disease, no response to bronchodilator.  Diffusion moderately reduced.  FEV1/FVC 0.59, DLCO 59%  --------------------------------------------------------------------------------------------------------------   01/29/20- 47  female former smoker followed for COPD, Chronic Hypoxic Resp Failure, history left lower lobe resection/chemotherapy 2355 NSCCa, complicated by DM 2, obesity / lap band surgery O2 2L/ [employer]   PFT ordered last year- not done due to Covid disruption. Neb albuterol, Anoro, Stiolto Respimat, Proair hfa, prednisone 2.5 mg daily maint for renal function,  -----pt rescue inhaler is not covered by insurance.pt is here to get requalifed for oxygen Feels better with O2 during sleep and exertion- climbing stairs. Dislikes the powder in Anoro.  Covid vax- 2 Phizer  08/01/20- 77  female former smoker followed for COPD, Chronic Hypoxic Resp Failure, history left lower lobe resection/chemotherapy 7322 NSCCa, complicated by DM 2, obesity / lap band surgery O2 2L/ [employer]    Neb albuterol, Anoro, Stiolto Respimat, Proair hfa, prednisone 2.5 mg daily maint for renal function, Surgery 11/18- abd wall hernia repair Covid vax- Flu vax-    CXR 02/28/19- IMPRESSION: Postoperative change on the left with areas of scarring. No edema or consolidation. Stable cardiac silhouette. No evident  adenopathy.   ROS-see HPI + = positive Constitutional:   No-   weight loss, night sweats, fevers, chills,+ fatigue, lassitude. HEENT:   No-  headaches, +difficulty swallowing, tooth/dental problems, sore throat,       No-  sneezing, itching, ear ache, +nasal congestion, post nasal drip,  CV:  No-   chest pain, orthopnea, PND, swelling in lower extremities, anasarca, dizziness, palpitations Resp: +  shortness of breath with exertion or at rest.        productive cough,  non-productive cough,   coughing up of blood.              No-   change in color of mucus. wheezing.   Skin: No-   rash or lesions. GI:  No-   heartburn, indigestion, abdominal pain, nausea, vomiting, GU: . MS:  No-   joint pain or swelling.   cramping myalgias Neuro-     nothing unusual Psych:  No- change in mood or affect. No depression or anxiety.  No memory loss.  OBJ General- Alert, Oriented, Affect-appropriate, Distress- none acute, +obese Skin- rash-none, lesions- none, excoriation- none.  Lymphadenopathy- none Head- atraumatic            Eyes- Gross vision intact, PERRLA, conjunctivae clear secretions            Ears- Hearing, canals-normal            Nose- Clear, no-Septal dev, mucus, polyps, erosion, perforation             Throat- Mallampati III-IV , mucosa , drainage- none, tonsils- atrophic Neck- flexible , trachea midline, no stridor , thyroid nl, carotid no bruit Chest - symmetrical excursion , unlabored           Heart/CV- RRR , no murmur , no gallop  , no rub, nl s1 s2                           -  JVD- none , edema- none, stasis changes- none, varices- none           Lung- clear, wheeze- none, cough-none , dullness-none, rub- none           Chest wall- scar left chest Port-A-Cath Abd-  Br/ Gen/ Rectal- Not done, not indicated Extrem- cyanosis- none, clubbing, none, atrophy- none, strength- nl Neuro- grossly intact to observation

## 2020-08-01 ENCOUNTER — Ambulatory Visit: Payer: BC Managed Care – PPO | Admitting: Internal Medicine

## 2020-08-01 ENCOUNTER — Other Ambulatory Visit: Payer: Self-pay | Admitting: Physician Assistant

## 2020-08-03 DIAGNOSIS — J209 Acute bronchitis, unspecified: Secondary | ICD-10-CM | POA: Diagnosis not present

## 2020-08-03 DIAGNOSIS — R0602 Shortness of breath: Secondary | ICD-10-CM | POA: Diagnosis not present

## 2020-08-03 DIAGNOSIS — G4733 Obstructive sleep apnea (adult) (pediatric): Secondary | ICD-10-CM | POA: Diagnosis not present

## 2020-08-03 DIAGNOSIS — J449 Chronic obstructive pulmonary disease, unspecified: Secondary | ICD-10-CM | POA: Diagnosis not present

## 2020-08-03 DIAGNOSIS — J440 Chronic obstructive pulmonary disease with acute lower respiratory infection: Secondary | ICD-10-CM | POA: Diagnosis not present

## 2020-08-15 ENCOUNTER — Other Ambulatory Visit: Payer: Medicare Other

## 2020-08-15 ENCOUNTER — Other Ambulatory Visit: Payer: Self-pay

## 2020-08-15 DIAGNOSIS — Z20822 Contact with and (suspected) exposure to covid-19: Secondary | ICD-10-CM

## 2020-08-16 ENCOUNTER — Telehealth: Payer: Self-pay | Admitting: Unknown Physician Specialty

## 2020-08-16 ENCOUNTER — Encounter: Payer: Self-pay | Admitting: Unknown Physician Specialty

## 2020-08-16 ENCOUNTER — Other Ambulatory Visit: Payer: Self-pay | Admitting: Unknown Physician Specialty

## 2020-08-16 DIAGNOSIS — U071 COVID-19: Secondary | ICD-10-CM

## 2020-08-16 LAB — SARS-COV-2, NAA 2 DAY TAT

## 2020-08-16 LAB — NOVEL CORONAVIRUS, NAA: SARS-CoV-2, NAA: DETECTED — AB

## 2020-08-16 NOTE — Telephone Encounter (Signed)
I connected by phone with Zyanna A Cardarelli on 08/16/2020 at 3:56 PM to discuss the potential use of the antiviral REMDESIVIR for acute COVID-19 viral infection in non-hospitalized patients.   This patient is a 75 y.o. female that meets the FDA criteria for Emergency Use Authorization of [place].  Has a (+) direct SARS-CoV-2 viral test result  Has mild or moderate symptoms related to COVID-19  Is within 7 days of symptom onset  Has at least one of the high risk factor(s) for progression to severe COVID-19 and/or hospitalization as defined in NIH Guidelines and EUA.   Specific high risk criteria : Older age (>/= 76 yo), BMI > 25 and Cardiovascular disease or hypertension   I have spoken and communicated the following to the patient or parent/caregiver regarding COVID-19 IV Antiviral treatment:  1. FDA has authorized the emergency use for the treatment of mild to moderate COVID-19 in adults and pediatric patients with positive results of SARS-CoV-2 testing who are 34 years of age and older weighing at least 40 kg, and who are at high risk for progressing to severe COVID-19 and/or hospitalization.  2. The significant known and potential risks and benefits in receiving REMDESIVIR in accordance with current NIH treatment guidelines.   3. Information on available alternative treatments and the risks and benefits of those alternatives, including clinical trials that may be accessible to the patient.   4. Patients treated with antiviral therapy should continue to isolate and use infection control measures (e.g., wear mask, isolate, social distance, avoid sharing personal items, clean and disinfect "high touch" surfaces, and frequent handwashing) according to CDC guidelines.   5. The patient or parent/caregiver has the option to accept or refuse REMDESIVIR therapy and has had the opportunity to have all questions addressed prior to consenting for treatment.    After reviewing this information  with the patient, he/she has decided to proceed with the 3 day course of treatment.   Kathrine Haddock, NP 08/16/2020  3:56 PM

## 2020-08-17 ENCOUNTER — Ambulatory Visit (HOSPITAL_COMMUNITY): Payer: Medicare Other

## 2020-08-17 ENCOUNTER — Ambulatory Visit (HOSPITAL_COMMUNITY)
Admission: RE | Admit: 2020-08-17 | Discharge: 2020-08-17 | Disposition: A | Discharge status: Home / Self Care | Payer: Medicare Other | Source: Ambulatory Visit | Attending: Pulmonary Disease | Admitting: Pulmonary Disease

## 2020-08-17 DIAGNOSIS — U071 COVID-19: Secondary | ICD-10-CM | POA: Insufficient documentation

## 2020-08-17 MED ORDER — METHYLPREDNISOLONE SODIUM SUCC 125 MG IJ SOLR: 125.0000 mg | Freq: Once | INTRAMUSCULAR | Status: DC | PRN

## 2020-08-17 MED ORDER — ALBUTEROL SULFATE HFA 108 (90 BASE) MCG/ACT IN AERS: 2.0000 | INHALATION_SPRAY | Freq: Once | RESPIRATORY_TRACT | Status: DC | PRN

## 2020-08-17 MED ORDER — SODIUM CHLORIDE 0.9 % IV SOLN
100.0000 mg | Freq: Once | INTRAVENOUS | Status: AC
  Administered 2020-08-17: 100 mg via INTRAVENOUS

## 2020-08-17 MED ORDER — EPINEPHRINE 0.3 MG/0.3ML IJ SOAJ: 0.3000 mg | Freq: Once | INTRAMUSCULAR | Status: DC | PRN

## 2020-08-17 MED ORDER — FAMOTIDINE IN NACL 20-0.9 MG/50ML-% IV SOLN: 20.0000 mg | Freq: Once | INTRAVENOUS | Status: DC | PRN

## 2020-08-17 MED ORDER — DIPHENHYDRAMINE HCL 50 MG/ML IJ SOLN: 50.0000 mg | Freq: Once | INTRAMUSCULAR | Status: DC | PRN

## 2020-08-17 MED ORDER — SODIUM CHLORIDE 0.9 % IV SOLN: INTRAVENOUS | Status: DC | PRN

## 2020-08-17 NOTE — Discharge Instructions (Signed)
10 Things You Can Do to Manage Your COVID-19 Symptoms at Home If you have possible or confirmed COVID-19: 1. Stay home except to get medical care. 2. Monitor your symptoms carefully. If your symptoms get worse, call your healthcare provider immediately. 3. Get rest and stay hydrated. 4. If you have a medical appointment, call the healthcare provider ahead of time and tell them that you have or may have COVID-19. 5. For medical emergencies, call 911 and notify the dispatch personnel that you have or may have COVID-19. 6. Cover your cough and sneezes with a tissue or use the inside of your elbow. 7. Wash your hands often with soap and water for at least 20 seconds or clean your hands with an alcohol-based hand sanitizer that contains at least 60% alcohol. 8. As much as possible, stay in a specific room and away from other people in your home. Also, you should use a separate bathroom, if available. If you need to be around other people in or outside of the home, wear a mask. 9. Avoid sharing personal items with other people in your household, like dishes, towels, and bedding. 10. Clean all surfaces that are touched often, like counters, tabletops, and doorknobs. Use household cleaning sprays or wipes according to the label instructions. [email or web address] 02/02/2020 This information is not intended to replace advice given to you by your health care provider. Make sure you discuss any questions you have with your health care provider. If you have any questions or concerns after the infusion please call the Advanced Practice Provider on call at [phone]. This number is ONLY intended for your use regarding questions or concerns about the infusion post-treatment side-effects.  Please do not provide this number to others for use. For return to work notes please contact your primary care provider.   If someone you know is interested in receiving treatment please have them contact their MD for a referral  or visit [email or web address]

## 2020-08-17 NOTE — Progress Notes (Signed)
  Diagnosis: COVID-19  Physician:  Procedure: [facility] Med: remdesivir infusion - Provided patient with remdesivir fact sheet for patients, parents and caregivers prior to infusion.  Complications: No immediate complications noted.  Discharge: Discharged home   Barry 08/17/2020

## 2020-08-19 ENCOUNTER — Ambulatory Visit (HOSPITAL_COMMUNITY)
Admission: RE | Admit: 2020-08-19 | Discharge: 2020-08-19 | Disposition: A | Discharge status: Home / Self Care | Payer: Medicare Other | Source: Ambulatory Visit | Attending: Pulmonary Disease | Admitting: Pulmonary Disease

## 2020-08-19 DIAGNOSIS — U071 COVID-19: Secondary | ICD-10-CM | POA: Diagnosis not present

## 2020-08-19 DIAGNOSIS — J1289 Other viral pneumonia: Secondary | ICD-10-CM | POA: Diagnosis present

## 2020-08-19 MED ORDER — FAMOTIDINE IN NACL 20-0.9 MG/50ML-% IV SOLN: 20.0000 mg | Freq: Once | INTRAVENOUS | Status: DC | PRN

## 2020-08-19 MED ORDER — METHYLPREDNISOLONE SODIUM SUCC 125 MG IJ SOLR: 125.0000 mg | Freq: Once | INTRAMUSCULAR | Status: DC | PRN

## 2020-08-19 MED ORDER — SODIUM CHLORIDE 0.9 % IV SOLN: INTRAVENOUS | Status: DC | PRN

## 2020-08-19 MED ORDER — DIPHENHYDRAMINE HCL 50 MG/ML IJ SOLN: 50.0000 mg | Freq: Once | INTRAMUSCULAR | Status: DC | PRN

## 2020-08-19 MED ORDER — EPINEPHRINE 0.3 MG/0.3ML IJ SOAJ: 0.3000 mg | Freq: Once | INTRAMUSCULAR | Status: DC | PRN

## 2020-08-19 MED ORDER — SODIUM CHLORIDE 0.9 % IV SOLN
100.0000 mg | Freq: Once | INTRAVENOUS | Status: AC
  Administered 2020-08-19: 100 mg via INTRAVENOUS

## 2020-08-19 MED ORDER — ALBUTEROL SULFATE HFA 108 (90 BASE) MCG/ACT IN AERS: 2.0000 | INHALATION_SPRAY | Freq: Once | RESPIRATORY_TRACT | Status: DC | PRN

## 2020-08-19 NOTE — Discharge Instructions (Signed)
10 Things You Can Do to Manage Your COVID-19 Symptoms at Home °If you have possible or confirmed COVID-19: °1. Stay home except to get medical care. °2. Monitor your symptoms carefully. If your symptoms get worse, call your healthcare provider immediately. °3. Get rest and stay hydrated. °4. If you have a medical appointment, call the healthcare provider ahead of time and tell them that you have or may have COVID-19. °5. For medical emergencies, call 911 and notify the dispatch personnel that you have or may have COVID-19. °6. Cover your cough and sneezes with a tissue or use the inside of your elbow. °7. Wash your hands often with soap and water for at least 20 seconds or clean your hands with an alcohol-based hand sanitizer that contains at least 60% alcohol. °8. As much as possible, stay in a specific room and away from other people in your home. Also, you should use a separate bathroom, if available. If you need to be around other people in or outside of the home, wear a mask. °9. Avoid sharing personal items with other people in your household, like dishes, towels, and bedding. °10. Clean all surfaces that are touched often, like counters, tabletops, and doorknobs. Use household cleaning sprays or wipes according to the label instructions. °cdc.gov/coronavirus °02/02/2020 °This information is not intended to replace advice given to you by your health care provider. Make sure you discuss any questions you have with your health care provider. °Document Revised: 05/20/2020 Document Reviewed: 05/20/2020 °Elsevier Patient Education © 2021 Elsevier Inc. °If you have any questions or concerns after the infusion please call the Advanced Practice Provider on call at [phone]. This number is ONLY intended for your use regarding questions or concerns about the infusion post-treatment side-effects.  Please do not provide this number to others for use. For return to work notes please contact your primary care provider.   ° °If someone you know is interested in receiving treatment please have them contact their MD for a referral or visit [email or web address] ° ° ° °

## 2020-08-19 NOTE — Progress Notes (Signed)
Patient reviewed Fact Sheet for Patients, Parents, and Caregivers for remdesivir for the Treatment of Coronavirus. Patient also reviewed and is agreeable to the estimated cost of treatment. Patient is agreeable to proceed.

## 2020-08-19 NOTE — Discharge Summary (Signed)
  Diagnosis: COVID-19   Physician: Joya Gaskins  Procedure: [facility] Med: remdesivir infusion - Provided patient with remdesivir fact sheet for patients, parents and caregivers prior to infusion.  Complications: No immediate complications noted.  Discharge: Discharged home   Chyrel Masson 08/19/2020

## 2020-08-20 ENCOUNTER — Ambulatory Visit (HOSPITAL_COMMUNITY)
Admission: RE | Admit: 2020-08-20 | Discharge: 2020-08-20 | Disposition: A | Discharge status: Home / Self Care | Payer: Medicare Other | Source: Ambulatory Visit | Attending: Pulmonary Disease | Admitting: Pulmonary Disease

## 2020-08-20 DIAGNOSIS — U071 COVID-19: Secondary | ICD-10-CM | POA: Diagnosis not present

## 2020-08-20 MED ORDER — ALBUTEROL SULFATE HFA 108 (90 BASE) MCG/ACT IN AERS: 2.0000 | INHALATION_SPRAY | Freq: Once | RESPIRATORY_TRACT | Status: DC | PRN

## 2020-08-20 MED ORDER — DIPHENHYDRAMINE HCL 50 MG/ML IJ SOLN: 50.0000 mg | Freq: Once | INTRAMUSCULAR | Status: DC | PRN

## 2020-08-20 MED ORDER — METHYLPREDNISOLONE SODIUM SUCC 125 MG IJ SOLR: 125.0000 mg | Freq: Once | INTRAMUSCULAR | Status: DC | PRN

## 2020-08-20 MED ORDER — EPINEPHRINE 0.3 MG/0.3ML IJ SOAJ: 0.3000 mg | Freq: Once | INTRAMUSCULAR | Status: DC | PRN

## 2020-08-20 MED ORDER — SODIUM CHLORIDE 0.9 % IV SOLN: INTRAVENOUS | Status: DC | PRN

## 2020-08-20 MED ORDER — SODIUM CHLORIDE 0.9 % IV SOLN
100.0000 mg | Freq: Once | INTRAVENOUS | Status: AC
  Administered 2020-08-20 (×2): 100 mg via INTRAVENOUS

## 2020-08-20 MED ORDER — FAMOTIDINE IN NACL 20-0.9 MG/50ML-% IV SOLN: 20.0000 mg | Freq: Once | INTRAVENOUS | Status: DC | PRN

## 2020-08-20 NOTE — Discharge Instructions (Signed)
10 Things You Can Do to Manage Your COVID-19 Symptoms at Home °If you have possible or confirmed COVID-19: °1. Stay home except to get medical care. °2. Monitor your symptoms carefully. If your symptoms get worse, call your healthcare provider immediately. °3. Get rest and stay hydrated. °4. If you have a medical appointment, call the healthcare provider ahead of time and tell them that you have or may have COVID-19. °5. For medical emergencies, call 911 and notify the dispatch personnel that you have or may have COVID-19. °6. Cover your cough and sneezes with a tissue or use the inside of your elbow. °7. Wash your hands often with soap and water for at least 20 seconds or clean your hands with an alcohol-based hand sanitizer that contains at least 60% alcohol. °8. As much as possible, stay in a specific room and away from other people in your home. Also, you should use a separate bathroom, if available. If you need to be around other people in or outside of the home, wear a mask. °9. Avoid sharing personal items with other people in your household, like dishes, towels, and bedding. °10. Clean all surfaces that are touched often, like counters, tabletops, and doorknobs. Use household cleaning sprays or wipes according to the label instructions. °cdc.gov/coronavirus °02/02/2020 °This information is not intended to replace advice given to you by your health care provider. Make sure you discuss any questions you have with your health care provider. °Document Revised: 05/20/2020 Document Reviewed: 05/20/2020 °Elsevier Patient Education © 2021 Elsevier Inc. °If you have any questions or concerns after the infusion please call the Advanced Practice Provider on call at [phone]. This number is ONLY intended for your use regarding questions or concerns about the infusion post-treatment side-effects.  Please do not provide this number to others for use. For return to work notes please contact your primary care provider.   ° °If someone you know is interested in receiving treatment please have them contact their MD for a referral or visit [email or web address] ° ° ° °

## 2020-08-20 NOTE — Progress Notes (Signed)
  Diagnosis: COVID-19  Physician:Dr Joya Gaskins   Procedure: [facility] Med: remdesivir infusion - Provided patient with remdesivir fact sheet for patients, parents and caregivers prior to infusion.  Complications: No immediate complications noted.  Discharge: Discharged home   Dewaine Oats 08/20/2020

## 2020-08-20 NOTE — Progress Notes (Signed)
Patient reviewed Fact Sheet for Patients, Parents, and Caregivers for Emergency Use Authorization (EUA) of sotrovimab for the Treatment of Coronavirus. Patient also reviewed and is agreeable to the estimated cost of treatment. Patient is agreeable to proceed.   

## 2020-09-03 DIAGNOSIS — R0602 Shortness of breath: Secondary | ICD-10-CM | POA: Diagnosis not present

## 2020-09-03 DIAGNOSIS — J449 Chronic obstructive pulmonary disease, unspecified: Secondary | ICD-10-CM | POA: Diagnosis not present

## 2020-09-03 DIAGNOSIS — J209 Acute bronchitis, unspecified: Secondary | ICD-10-CM | POA: Diagnosis not present

## 2020-09-03 DIAGNOSIS — G4733 Obstructive sleep apnea (adult) (pediatric): Secondary | ICD-10-CM | POA: Diagnosis not present

## 2020-09-03 DIAGNOSIS — J440 Chronic obstructive pulmonary disease with acute lower respiratory infection: Secondary | ICD-10-CM | POA: Diagnosis not present

## 2020-10-01 DIAGNOSIS — G4733 Obstructive sleep apnea (adult) (pediatric): Secondary | ICD-10-CM | POA: Diagnosis not present

## 2020-10-01 DIAGNOSIS — J440 Chronic obstructive pulmonary disease with acute lower respiratory infection: Secondary | ICD-10-CM | POA: Diagnosis not present

## 2020-10-01 DIAGNOSIS — J449 Chronic obstructive pulmonary disease, unspecified: Secondary | ICD-10-CM | POA: Diagnosis not present

## 2020-10-01 DIAGNOSIS — J209 Acute bronchitis, unspecified: Secondary | ICD-10-CM | POA: Diagnosis not present

## 2020-10-01 DIAGNOSIS — R0602 Shortness of breath: Secondary | ICD-10-CM | POA: Diagnosis not present

## 2020-10-22 ENCOUNTER — Encounter: Payer: Self-pay | Admitting: Podiatrist

## 2020-10-22 ENCOUNTER — Ambulatory Visit: Payer: Medicare Other | Admitting: Podiatrist

## 2020-10-22 ENCOUNTER — Other Ambulatory Visit: Payer: Self-pay

## 2020-10-22 DIAGNOSIS — M79676 Pain in unspecified toe(s): Secondary | ICD-10-CM

## 2020-10-22 DIAGNOSIS — E1151 Type 2 diabetes mellitus with diabetic peripheral angiopathy without gangrene: Secondary | ICD-10-CM | POA: Diagnosis not present

## 2020-10-22 DIAGNOSIS — I83893 Varicose veins of bilateral lower extremities with other complications: Secondary | ICD-10-CM

## 2020-10-22 DIAGNOSIS — B351 Tinea unguium: Secondary | ICD-10-CM

## 2020-10-22 NOTE — Patient Instructions (Signed)
Diabetes Mellitus and Foot Care Foot care is an important part of your health, especially when you have diabetes. Diabetes may cause you to have problems because of poor blood flow (circulation) to your feet and legs, which can cause your skin to:  Become thinner and drier.  Break more easily.  Heal more slowly.  Peel and crack. You may also have nerve damage (neuropathy) in your legs and feet, causing decreased feeling in them. This means that you may not notice minor injuries to your feet that could lead to more serious problems. Noticing and addressing any potential problems early is the best way to prevent future foot problems. How to care for your feet Foot hygiene  Wash your feet daily with warm water and mild soap. Do not use hot water. Then, pat your feet and the areas between your toes until they are completely dry. Do not soak your feet as this can dry your skin.  . File the edges of your nails with an emery board or nail file.  Apply a moisturizing lotion or petroleum jelly to the skin on your feet and to dry, brittle toenails. Use lotion that does not contain alcohol and is unscented. Do not apply lotion between your toes.   Shoes and socks  Wear clean socks or stockings every day. Make sure they are not too tight. Do not wear knee-high stockings since they may decrease blood flow to your legs.  Wear shoes that fit properly and have enough cushioning. Always look in your shoes before you put them on to be sure there are no objects inside.  To break in new shoes, wear them for just a few hours a day. This prevents injuries on your feet. Wounds, scrapes, corns, and calluses  Check your feet daily for blisters, cuts, bruises, sores, and redness. If you cannot see the bottom of your feet, use a mirror or ask someone for help.  Do not cut corns or calluses or try to remove them with medicine.  If you find a minor scrape, cut, or break in the skin on your feet, keep it and the  skin around it clean and dry. You may clean these areas with mild soap and water. Do not clean the area with peroxide, alcohol, or iodine.  If you have a wound, scrape, corn, or callus on your foot, look at it several times a day to make sure it is healing and not infected. Check for: ? Redness, swelling, or pain. ? Fluid or blood. ? Warmth. ? Pus or a bad smell.   General tips  Do not cross your legs. This may decrease blood flow to your feet.  Do not use heating pads or hot water bottles on your feet. They may burn your skin. If you have lost feeling in your feet or legs, you may not know this is happening until it is too late.  Protect your feet from hot and cold by wearing shoes, such as at the beach or on hot pavement.  Schedule a complete foot exam at least once a year (annually) or more often if you have foot problems. Report any cuts, sores, or bruises to your health care provider immediately. Where to find more information  American Diabetes Association: www.diabetes.org  Association of Diabetes Care & Education Specialists: www.diabeteseducator.org Contact a health care provider if:  You have a medical condition that increases your risk of infection and you have any cuts, sores, or bruises on your feet.  You have  an injury that is not healing.  You have redness on your legs or feet.  You feel burning or tingling in your legs or feet.  You have pain or cramps in your legs and feet.  Your legs or feet are numb.  Your feet always feel cold.  You have pain around any toenails. Get help right away if:  You have a wound, scrape, corn, or callus on your foot and: ? You have pain, swelling, or redness that gets worse. ? You have fluid or blood coming from the wound, scrape, corn, or callus. ? Your wound, scrape, corn, or callus feels warm to the touch. ? You have pus or a bad smell coming from the wound, scrape, corn, or callus. ? You have a fever. ? You have a red  line going up your leg. Summary  Check your feet every day for blisters, cuts, bruises, sores, and redness.  Apply a moisturizing lotion or petroleum jelly to the skin on your feet and to dry, brittle toenails.  Wear shoes that fit properly and have enough cushioning.  If you have foot problems, report any cuts, sores, or bruises to your health care provider immediately.  Schedule a complete foot exam at least once a year (annually) or more often if you have foot problems. This information is not intended to replace advice given to you by your health care provider. Make sure you discuss any questions you have with your health care provider. Document Revised: 01/25/2020 Document Reviewed: 01/25/2020 Elsevier Patient Education  [place].

## 2020-10-22 NOTE — Progress Notes (Signed)
Chief Complaint  Patient presents with  . Foot care     [facility].     HPI: Patient is 75 y.o. female who presents today a diabetic foot evaluation as well as for a nail trim. She was going to a pedicurist but since she is diabetic she decided it would be better for her to see a podiatrist. She relates some of her nails become ingrown on the corners at times.  She also relates some swelling and varicose veins in her feet and legs.   Patient Active Problem List   Diagnosis Date Noted  . Incarcerated incisional hernia s/p lap repair w mesh 06/06/2020 06/06/2020  . Chronic respiratory failure with hypoxia ([place]) 04/01/2019  . Medication management 02/28/2019  . Dysphagia 12/30/2017  . Encounter for preoperative examination for general surgical procedure 03/06/2016  . Cough 08/02/2015  . Obstructive sleep apnea 08/06/2013  . COPD with acute bronchitis ([place]) 06/18/2013  . Myalgia 06/18/2013  . History of laparoscopic adjustable gastric banding, APS. 09/04/2008. 04/08/2011  . History of lung cancer 06/27/2010  . SKIN RASH 12/13/2009  . DYSPNEA 07/05/2008  . DIABETES, TYPE 2 07/01/2008  . Obesity, morbid ([place]) 07/01/2008  . COPD mixed type ([place]) 06/26/2008    Current Outpatient Medications on File Prior to Visit  Medication Sig Dispense Refill  . acetaminophen (TYLENOL) 650 MG CR tablet Take 1,300 mg by mouth every 8 (eight) hours as needed for pain.    Marland Kitchen albuterol (PROVENTIL) (2.5 MG/3ML) 0.083% nebulizer solution Take 3 mLs (2.5 mg total) by nebulization every 8 (eight) hours as needed for wheezing or shortness of breath. 360 mL 3  . albuterol (VENTOLIN HFA) 108 (90 Base) MCG/ACT inhaler Inhale 2 puffs into the lungs every 6 (six) hours as needed for wheezing or shortness of breath. 18 g 12  . aspirin EC 81 MG tablet Take 81 mg by mouth daily. Swallow whole.    . Biotin 5000 MCG TABS Take 5,000 mcg by mouth daily.     . Calcium Carbonate (CALCIUM 500 PO) Take 1,500 mg by mouth daily.    .  calcium carbonate (TUMS - DOSED IN MG ELEMENTAL CALCIUM) 500 MG chewable tablet Chew 2 tablets by mouth daily as needed for indigestion or heartburn.    . cetirizine (ZYRTEC) 10 MG tablet Take 10 mg by mouth daily.    . diclofenac Sodium (VOLTAREN) 1 % GEL Apply 1 application topically 4 (four) times daily as needed (pain).    Marland Kitchen diphenhydrAMINE (BENADRYL) 25 mg capsule Take 50 mg by mouth daily.     Marland Kitchen docusate sodium (COLACE) 100 MG capsule Take 200 mg by mouth at bedtime.    . Fluticasone-Salmeterol (ADVAIR DISKUS) 100-50 MCG/DOSE AEPB Inhale 1 puff then rinse mouth, twice daily (Patient taking differently: Inhale 1 puff into the lungs 2 (two) times daily. ) 60 each 12  . glucose 4 GM chewable tablet Chew 20-24 g by mouth as needed for low blood sugar.    . Homeopathic Products (LEG CRAMPS PO) Take 2 tablets by mouth daily.    Marland Kitchen HYDROcodone-Chlorpheniramine 5-4 MG/5ML SOLN 5 ml every 6 hours if needed for cough (Patient taking differently: Take 5 mLs by mouth every 6 (six) hours as needed (cough). ) 240 mL 0  . linaclotide (LINZESS) 72 MCG capsule Take 72 mcg by mouth daily before breakfast.    . Magnesium 400 MG TABS Take 400 mg by mouth 2 (two) times daily.    . Melatonin 10 MG CAPS Take  10 mg by mouth at bedtime as needed (sleep).    . Multiple Vitamin (MULTIVITAMIN) tablet Take 1 tablet by mouth daily.    . Nebulizers (COMPRESSOR/NEBULIZER) MISC Use as directed 1 each prn  . Polyethyl Glycol-Propyl Glycol (SYSTANE OP) Place 1 drop into both eyes daily as needed (dry eyes).    . traMADol (ULTRAM) 50 MG tablet Take 1 tablet (50 mg total) by mouth every 6 (six) hours as needed for moderate pain or severe pain. 30 tablet 0   No current facility-administered medications on file prior to visit.    No Known Allergies  Review of Systems No fevers, chills, nausea, muscle aches, no difficulty breathing, no calf pain, no chest pain or shortness of breath.   Physical Exam  GENERAL APPEARANCE:  Alert, conversant. Appropriately groomed. No acute distress.   VASCULAR: Pedal pulses palpable 1/4 DP and PT bilateral.  Capillary refill time is immediate to all digits,  Proximal to distal cooling it warm to warm.  Digital perfusion adequate. Multiple variscosities are present to bilateral feet and lower legs with mild edema present bilateral.    NEUROLOGIC: sensation is intact to 5.07 monofilament at 5/5 sites bilateral.  Light touch is intact bilateral, vibratory sensation intact bilateral  MUSCULOSKELETAL: acceptable muscle strength, tone and stability bilateral.  No gross boney pedal deformities noted.  No pain, crepitus or limitation noted with foot and ankle range of motion bilateral.   DERMATOLOGIC: skin is warm, supple, and dry.  No open lesions noted.  No rash, no pre ulcerative lesions. Digital nails are long, brittle, discolored and painful with dorsal pressure.  Some of the corners do appear to grow close to the skin and have a tendency to cause discomfort.    Assessment     ICD-10-CM   1. Type 2 diabetes mellitus with peripheral vascular disease (HCC)  E11.51   2. Varicose veins of both legs with edema  I83.893   3. Pain due to onychomycosis of toenail  B35.1    M79.676      Plan Debridement of toenails was recommended.  Onychoreduction of symptomatic toenails was performed via nail nipper and power burr without iatrogenic incident.  Patient was instructed on signs and symptoms of infection and was told to call immediately should any of these arise.  Recommended routine care every 3 months or as needed for follow up.  If any redness, swelling or signs of infection arise she will call.  Information about diabetes and foot care dispensed.

## 2020-10-29 NOTE — Progress Notes (Signed)
HPI female former smoker followed for COPD, Chronic Hypoxic Resp Failure, history left lower lobe resection/chemotherapy 8127 NSCCa, complicated by DM, obesity/lap band surgery PFT 07/05/2008-moderate obstructive airways disease with response to bronchodilator, diffusion moderately reduced. FEV1 1.75/87%, FEV1/FVC 0.53, DLCO 49 Office Spirometry 07/04/2015-mild airway obstruction. FEV1/FVC 0.55 Office Spirometry 03/06/2016- mild obstructive airways disease-FVC 2.01/104%, FEV1 1.16/78%, ratio 0.58, FEF 25-75% 0.61/ 43% NPSG 07/03/13- AHI 5.3/ hr, weight 234 pounds PFT 05/03/17-mild obstructive airways disease, no response to bronchodilator.  Diffusion moderately reduced.  FEV1/FVC 0.59, DLCO 59%  --------------------------------------------------------------------------------------------------------------  01/29/20- 40  female former smoker followed for COPD, Chronic Hypoxic Resp Failure, history left lower lobe resection/chemotherapy 5170 NSCCa, complicated by DM 2, obesity / lap band surgery O2 2L/ [employer]   PFT ordered last year- not done due to Covid disruption. Neb albuterol, Anoro, Stiolto Respimat, Proair hfa, prednisone 2.5 mg daily maint for renal function,  -----pt rescue inhaler is not covered by insurance.pt is here to get requalifed for oxygen Feels better with O2 during sleep and exertion- climbing stairs. Dislikes the powder in Anoro.  Covid vax- 2 Phizer  10/30/20- 51  female former smoker followed for COPD, Chronic Hypoxic Resp Failure, history left lower lobe resection/chemotherapy 0174 NSCCa, complicated by DM 2, obesity / lap band surgery, Covid infection Jan 2022,  O2 2L/ [employer]    -Neb albuterol, Wixella 100-50, Stiolto Respimat, Ventolin hfa, prednisone 2.5 mg daily maint for renal function, Covid vax-3 Phizer Flu vax-had -----Patient is having some wheezing feels like the wixella inhaler is not helping. Wears 2 liters as needed Increased wheeze x 2 weeks.  Generalized  itching w/o visible rash.x 5-6 years. Dermatology gave cream. Treated for dry skin, taking 2 Zyrtec/ day, but it still bothers her. Allergy testing 20 yrs ago, reported positive, but she never started allergy vaccine.   ROS-see HPI + = positive Constitutional:   No-   weight loss, night sweats, fevers, chills,+ fatigue, lassitude. HEENT:   No-  headaches, +difficulty swallowing, tooth/dental problems, sore throat,       No-  sneezing, itching, ear ache, +nasal congestion, post nasal drip,  CV:  No-   chest pain, orthopnea, PND, swelling in lower extremities, anasarca, dizziness, palpitations Resp: +  shortness of breath with exertion or at rest.        productive cough,  non-productive cough,   coughing up of blood.              No-   change in color of mucus. wheezing.   Skin: No-   rash or lesions. GI:  No-   heartburn, indigestion, abdominal pain, nausea, vomiting, GU: . MS:  No-   joint pain or swelling.   cramping myalgias Neuro-     nothing unusual Psych:  No- change in mood or affect. No depression or anxiety.  No memory loss.  OBJ General- Alert, Oriented, Affect-appropriate, Distress- none acute, +obese Skin- rash-none, lesions- none, excoriation- none.  Lymphadenopathy- none Head- atraumatic            Eyes- Gross vision intact, PERRLA, conjunctivae clear secretions            Ears- Hearing, canals-normal            Nose- Clear, no-Septal dev, mucus, polyps, erosion, perforation             Throat- Mallampati III-IV , mucosa , drainage- none, tonsils- atrophic Neck- flexible , trachea midline, no stridor , thyroid nl, carotid no bruit Chest - symmetrical excursion ,  unlabored           Heart/CV- RRR , no murmur , no gallop  , no rub, nl s1 s2                           - JVD- none , edema- none, stasis changes- none, varices- none           Lung- clear, wheeze- none, cough-none , dullness-none, rub- none           Chest wall- scar left chest Port-A-Cath Abd-  Br/ Gen/  Rectal- Not done, not indicated Extrem- cyanosis- none, clubbing, none, atrophy- none, strength- nl Neuro- grossly intact to observation

## 2020-10-30 ENCOUNTER — Ambulatory Visit (INDEPENDENT_AMBULATORY_CARE_PROVIDER_SITE_OTHER): Payer: Medicare Other | Admitting: Internal Medicine

## 2020-10-30 ENCOUNTER — Telehealth: Payer: Self-pay | Admitting: Internal Medicine

## 2020-10-30 ENCOUNTER — Other Ambulatory Visit: Payer: Self-pay

## 2020-10-30 ENCOUNTER — Encounter: Payer: Self-pay | Admitting: Internal Medicine

## 2020-10-30 VITALS — BP 130/72 | HR 71 | Temp 97.6°F | Ht 60.0 in | Wt 214.8 lb

## 2020-10-30 DIAGNOSIS — J449 Chronic obstructive pulmonary disease, unspecified: Secondary | ICD-10-CM

## 2020-10-30 DIAGNOSIS — J45901 Unspecified asthma with (acute) exacerbation: Secondary | ICD-10-CM

## 2020-10-30 DIAGNOSIS — J9611 Chronic respiratory failure with hypoxia: Secondary | ICD-10-CM

## 2020-10-30 DIAGNOSIS — L299 Pruritus, unspecified: Secondary | ICD-10-CM

## 2020-10-30 LAB — CBC WITH DIFFERENTIAL/PLATELET
Basophils Absolute: 0 K/uL (ref 0.0–0.1)
Basophils Relative: 1 % (ref 0.0–3.0)
Eosinophils Absolute: 0.1 K/uL (ref 0.0–0.7)
Eosinophils Relative: 3.2 % (ref 0.0–5.0)
HCT: 44.3 % (ref 36.0–46.0)
Hemoglobin: 14.1 g/dL (ref 12.0–15.0)
Lymphocytes Relative: 20.3 % (ref 12.0–46.0)
Lymphs Abs: 0.9 K/uL (ref 0.7–4.0)
MCHC: 31.9 g/dL (ref 30.0–36.0)
MCV: 80.3 fl (ref 78.0–100.0)
Monocytes Absolute: 0.7 K/uL (ref 0.1–1.0)
Monocytes Relative: 16.7 % — ABNORMAL HIGH (ref 3.0–12.0)
Neutro Abs: 2.6 K/uL (ref 1.4–7.7)
Neutrophils Relative %: 58.8 % (ref 43.0–77.0)
Platelets: 197 K/uL (ref 150.0–400.0)
RBC: 5.52 Mil/uL — ABNORMAL HIGH (ref 3.87–5.11)
RDW: 15.9 % — ABNORMAL HIGH (ref 11.5–15.5)
WBC: 4.4 K/uL (ref 4.0–10.5)

## 2020-10-30 LAB — SEDIMENTATION RATE: Sed Rate: 13 mm/hr (ref 0–30)

## 2020-10-30 MED ORDER — BREZTRI AEROSPHERE 160-9-4.8 MCG/ACT IN AERO: 2.0000 | INHALATION_SPRAY | Freq: Two times a day (BID) | RESPIRATORY_TRACT | 0 refills | Status: DC

## 2020-10-30 MED ORDER — HYDROCOD POLST-CPM POLST ER 10-8 MG/5ML PO SUER: ORAL | 0 refills | Status: DC

## 2020-10-30 MED ORDER — METHYLPREDNISOLONE ACETATE 80 MG/ML IJ SUSP: 80.0000 mg | Freq: Once | INTRAMUSCULAR | Status: DC

## 2020-10-30 NOTE — Patient Instructions (Signed)
Order- depo 80    Dx asthma exacerbation  Order- labs- CBC w diff, IgE, sed rate, ANA      Dx pruritus  Order- sample x 2 Breztri inhaler      Inhale 2 puffs then rinse mouth, twice daily Try this instead of Wixella and [place].    You can still use your ventolin rescue inhaler if needed.

## 2020-10-30 NOTE — Telephone Encounter (Signed)
Cough syrup refilled

## 2020-10-30 NOTE — Telephone Encounter (Signed)
Tried to call pt again but still unable to reach. Will try to call pt tomorrow 4/14.

## 2020-10-30 NOTE — Telephone Encounter (Signed)
Called and spoke with patient, advised that I did not see an order or mention in his note of cough medication.  She states she gets the cough medication ever 6 months to have on hand in case she needs it.  I let her know I would send him a message and once we hear back from him we will call her back.  She verbalized understanding.    Dr. Annamaria Boots please advise regarding prescription for cough medication.  Thank you.

## 2020-10-30 NOTE — Telephone Encounter (Signed)
ATC x1.  Left VM to return call regarding refill.

## 2020-10-31 NOTE — Telephone Encounter (Signed)
Pt returned call I made her aware that rx was called in

## 2020-11-01 DIAGNOSIS — J449 Chronic obstructive pulmonary disease, unspecified: Secondary | ICD-10-CM | POA: Diagnosis not present

## 2020-11-01 DIAGNOSIS — R0602 Shortness of breath: Secondary | ICD-10-CM | POA: Diagnosis not present

## 2020-11-01 DIAGNOSIS — J440 Chronic obstructive pulmonary disease with acute lower respiratory infection: Secondary | ICD-10-CM | POA: Diagnosis not present

## 2020-11-01 DIAGNOSIS — J209 Acute bronchitis, unspecified: Secondary | ICD-10-CM | POA: Diagnosis not present

## 2020-11-01 DIAGNOSIS — G4733 Obstructive sleep apnea (adult) (pediatric): Secondary | ICD-10-CM | POA: Diagnosis not present

## 2020-11-01 LAB — IGE: IgE (Immunoglobulin E), Serum: 3 kU/L (ref <=114)

## 2020-11-01 LAB — ANA: Anti Nuclear Antibody (ANA): NEGATIVE

## 2020-11-01 LAB — CBC WITH DIFFERENTIAL/PLATELET

## 2020-11-01 LAB — SEDIMENTATION RATE

## 2020-11-04 ENCOUNTER — Other Ambulatory Visit: Payer: BC Managed Care – PPO

## 2020-11-05 ENCOUNTER — Ambulatory Visit: Payer: BC Managed Care – PPO | Attending: Critical Care Medicine

## 2020-11-05 DIAGNOSIS — Z20822 Contact with and (suspected) exposure to covid-19: Secondary | ICD-10-CM | POA: Diagnosis not present

## 2020-11-06 ENCOUNTER — Telehealth: Payer: Self-pay | Admitting: Internal Medicine

## 2020-11-06 LAB — SARS-COV-2, NAA 2 DAY TAT

## 2020-11-06 LAB — NOVEL CORONAVIRUS, NAA: SARS-CoV-2, NAA: NOT DETECTED

## 2020-11-06 NOTE — Telephone Encounter (Signed)
Adrienne Lever, MD  11/04/2020 3:18 PM EDT      Lab- allergy markers were not increased (IgE antibodies and eosinophils). Pollen may still be causing symptoms, but a virus infection could seem very similar. Let us know if not doing well.    Patient is aware of results and voiced her understanding.  Nothing further needed at this time.

## 2020-11-13 ENCOUNTER — Telehealth: Payer: Self-pay | Admitting: Internal Medicine

## 2020-11-13 NOTE — Telephone Encounter (Signed)
I spoke with the pt. She received letter to call about labs, however, she was informed of labs 11/06/20 so I told her to disregard the letter.  Nothing further needed.

## 2020-11-14 DIAGNOSIS — Z1231 Encounter for screening mammogram for malignant neoplasm of breast: Secondary | ICD-10-CM | POA: Diagnosis not present

## 2020-11-14 DIAGNOSIS — M8589 Other specified disorders of bone density and structure, multiple sites: Secondary | ICD-10-CM | POA: Diagnosis not present

## 2020-11-14 DIAGNOSIS — M8580 Other specified disorders of bone density and structure, unspecified site: Secondary | ICD-10-CM | POA: Diagnosis not present

## 2020-11-14 DIAGNOSIS — E119 Type 2 diabetes mellitus without complications: Secondary | ICD-10-CM | POA: Diagnosis not present

## 2020-11-14 DIAGNOSIS — R252 Cramp and spasm: Secondary | ICD-10-CM | POA: Diagnosis not present

## 2020-11-18 DIAGNOSIS — R7303 Prediabetes: Secondary | ICD-10-CM | POA: Insufficient documentation

## 2020-12-01 DIAGNOSIS — R0602 Shortness of breath: Secondary | ICD-10-CM | POA: Diagnosis not present

## 2020-12-01 DIAGNOSIS — R059 Cough, unspecified: Secondary | ICD-10-CM | POA: Diagnosis not present

## 2020-12-01 DIAGNOSIS — J209 Acute bronchitis, unspecified: Secondary | ICD-10-CM | POA: Diagnosis not present

## 2020-12-01 DIAGNOSIS — J449 Chronic obstructive pulmonary disease, unspecified: Secondary | ICD-10-CM | POA: Diagnosis not present

## 2020-12-01 DIAGNOSIS — G4733 Obstructive sleep apnea (adult) (pediatric): Secondary | ICD-10-CM | POA: Diagnosis not present

## 2020-12-03 NOTE — Progress Notes (Signed)
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 FEV1 1.75/87%, FEV1/FVC 0.53, DLCO 49 Office Spirometry 07/04/2015-mild airway obstruction. FEV1/FVC 0.55 Office Spirometry 03/06/2016- mild obstructive airways disease-FVC 2.01/104%, FEV1 1.16/78%, ratio 0.58, FEF 25-75% 0.61/ 43% NPSG 07/03/13- AHI 5.3/ hr, weight 234 pounds PFT 05/03/17-mild obstructive airways disease, no response to bronchodilator.  Diffusion moderately reduced.  FEV1/FVC 0.59, DLCO 59%  --------------------------------------------------------------------------------------------------------------   10/30/20- 65  female former smoker followed for COPD, Chronic Hypoxic Resp Failure, history left lower lobe resection/chemotherapy 6283 NSCCa, complicated by DM 2, obesity / lap band surgery, Covid infection Jan 2022,  O2 2L/ [employer]    -Neb albuterol, Wixella 100-50, Stiolto Respimat, Ventolin hfa, prednisone 2.5 mg daily maint for renal function, Covid vax-3 Phizer Flu vax-had -----Patient is having some wheezing feels like the wixella inhaler is not helping. Wears 2 liters as needed  51/22- 1  female former smoker followed for COPD, Chronic Hypoxic Resp Failure, history left lower lobe resection/chemotherapy 6629 NSCCa, complicated by DM 2, obesity / lap band surgery, Covid infection Jan 2022,  O2 2L/ [employer]   -Neb albuterol, Wixella 100-50, Stiolto Respimat, Ventolin hfa, prednisone 2.5 mg daily maint for renal function,    Sampled Breztri Covid vax-3 Phizer Lab- 4/13-  Eos 0.1, sed rate 13, IgE 3, ANA neg,  -----Dry cough, feels like something is stuck in throat. Wheezing "For an overweight old woman, I do all right". No acute complaint. Feels mucus sometimes in throat, but no mechanical  block.  Liked Breztri sample- we will refill. Discussed alternative antihistamines.   ROS-see HPI + = positive Constitutional:   No-   weight loss, night sweats, fevers, chills,+ fatigue, lassitude. HEENT:   No-  headaches, +difficulty swallowing, tooth/dental problems, sore throat,       No-  sneezing, itching, ear ache, +nasal congestion, post nasal drip,  CV:  No-   chest pain, orthopnea, PND, swelling in lower extremities, anasarca, dizziness, palpitations Resp: +  shortness of breath with exertion or at rest.        productive cough,  non-productive cough,   coughing up of blood.              No-   change in color of mucus. wheezing.   Skin: No-   rash or lesions. GI:  No-   heartburn, indigestion, abdominal pain, nausea, vomiting, GU: . MS:  No-   joint pain or swelling.   cramping myalgias Neuro-     nothing unusual Psych:  No- change in mood or affect. No depression or anxiety.  No memory loss.  OBJ General- Alert, Oriented, Affect-appropriate, Distress- none acute, +obese Skin- rash-none, lesions- none, excoriation- none.  Lymphadenopathy- none Head- atraumatic            Eyes- Gross vision intact, PERRLA, conjunctivae clear secretions            Ears- Hearing, canals-normal            Nose- Clear, no-Septal dev, mucus, polyps, erosion, perforation             Throat- Mallampati III-IV , mucosa , drainage- none, tonsils- atrophic Neck- flexible , trachea midline, no stridor , thyroid nl, carotid no bruit Chest - symmetrical excursion , unlabored           Heart/CV- RRR , no murmur , no  gallop  , no rub, nl s1 s2                           - JVD- none , edema- none, stasis changes- none, varices- none           Lung- clear, wheeze- none, cough-none , dullness-none, rub- none           Chest wall- scar left chest Port-A-Cath Abd-  Br/ Gen/ Rectal- Not done, not indicated Extrem- cyanosis- none, clubbing, none, atrophy- none, strength- nl Neuro- grossly intact to  observation

## 2020-12-04 ENCOUNTER — Encounter: Payer: Self-pay | Admitting: Internal Medicine

## 2020-12-04 ENCOUNTER — Ambulatory Visit (INDEPENDENT_AMBULATORY_CARE_PROVIDER_SITE_OTHER): Payer: Medicare Other | Admitting: Internal Medicine

## 2020-12-04 ENCOUNTER — Other Ambulatory Visit: Payer: Self-pay

## 2020-12-04 ENCOUNTER — Ambulatory Visit (INDEPENDENT_AMBULATORY_CARE_PROVIDER_SITE_OTHER): Payer: Medicare Other

## 2020-12-04 VITALS — BP 130/60 | HR 60 | Temp 98.4°F | Ht 60.0 in | Wt 210.0 lb

## 2020-12-04 DIAGNOSIS — J449 Chronic obstructive pulmonary disease, unspecified: Secondary | ICD-10-CM

## 2020-12-04 DIAGNOSIS — J9611 Chronic respiratory failure with hypoxia: Secondary | ICD-10-CM | POA: Diagnosis not present

## 2020-12-04 DIAGNOSIS — Z720 Tobacco use: Secondary | ICD-10-CM | POA: Diagnosis not present

## 2020-12-04 DIAGNOSIS — J986 Disorders of diaphragm: Secondary | ICD-10-CM | POA: Diagnosis not present

## 2020-12-04 DIAGNOSIS — Z85118 Personal history of other malignant neoplasm of bronchus and lung: Secondary | ICD-10-CM | POA: Diagnosis not present

## 2020-12-04 MED ORDER — BREZTRI AEROSPHERE 160-9-4.8 MCG/ACT IN AERO: 2.0000 | INHALATION_SPRAY | Freq: Two times a day (BID) | RESPIRATORY_TRACT | 12 refills | Status: DC

## 2020-12-04 NOTE — Assessment & Plan Note (Signed)
She understands goal of normal weight. Prior bariatric surgery.

## 2020-12-04 NOTE — Assessment & Plan Note (Addendum)
Mild persistent, uncomplicated Plan- Rx Breztri, Mucinex. Update CXR

## 2020-12-04 NOTE — Assessment & Plan Note (Signed)
Need to discuss use at next ov

## 2020-12-04 NOTE — Patient Instructions (Signed)
Script sent for [employer] inhaler  Suggest Mucinex and water to help thin mucus  - usually about 1200 mg mucinex twice daily  Ok to change among antihistamines to see if one works better, as needed for sneezing, allergy and postnasal drip.  Order- CXR    Dx hx tobacco use, hx lung Ca  Please call if we can help

## 2020-12-07 ENCOUNTER — Encounter: Payer: Self-pay | Admitting: *Deleted

## 2020-12-11 ENCOUNTER — Other Ambulatory Visit: Payer: BC Managed Care – PPO

## 2020-12-20 ENCOUNTER — Ambulatory Visit: Payer: Medicare Other | Admitting: Orthopaedic Surgery

## 2020-12-20 ENCOUNTER — Encounter: Payer: Self-pay | Admitting: Orthopaedic Surgery

## 2020-12-20 DIAGNOSIS — M79605 Pain in left leg: Secondary | ICD-10-CM

## 2020-12-20 NOTE — Progress Notes (Signed)
Office Visit Note   Patient: [name]           Date of Birth: [birth year]           MRN: [id number] Visit Date: 12/20/2020              Requested by: Seward Carol, MD [address] [place],  [place] [address] PCP: Seward Carol, MD   Assessment & Plan: Visit Diagnoses:  1. Pain in left leg   2.      Pain in the right leg  Plan: Conservative treatment recommended.  If symptoms are not severe enough at this point to continue with diagnostic imaging since it only lasts for a few minutes and then completely resolved.  No red flags other than history of cancer of the lung.  She can return in 1 to 2 months if she had progressive symptoms.  Follow-Up Instructions: Return if symptoms worsen or fail to improve.   Orders:  No orders of the defined types were placed in this encounter.  No orders of the defined types were placed in this encounter.     Procedures: No procedures performed   Clinical Data: No additional findings.   Subjective: Chief Complaint  Patient presents with  . Right Leg - Pain  . Left Leg - Pain  . left anterior chest    "knot"    HPI patient is here concerning some very sharp pains that she felt between her knee and ankle primarily lateral in her calf worse on the right than left which was debilitating and she states she could move her could barely stand when she had the pain.  It was short-term.  She states she might have some shortening of her left leg versus right.  She is also noticed some prominence of her left sternoclavicular joint with no history of injury.  No dyspnea.  Does have a history of lung cancer type 2 diabetes, COPD.  Sleep apnea obesity BMI 41.  Review of Systems no myelopathic symptoms.  No groin pain.  No chills or fever.  Positive for renal cysts visualized on CT scan abdomen 2019.  All other systems noncontributory.   Objective: Vital Signs: BP (!) 147/88   Pulse 65   Ht 5' (1.524 m)   Wt 210 lb  (95.3 kg)   BMI 41.01 kg/m   Physical Exam Constitutional:      Appearance: She is well-developed.  HENT:     Head: Normocephalic.     Right Ear: External ear normal.     Left Ear: External ear normal.  Eyes:     Pupils: Pupils are equal, round, and reactive to light.  Neck:     Thyroid: No thyromegaly.     Trachea: No tracheal deviation.  Cardiovascular:     Rate and Rhythm: Normal rate.  Pulmonary:     Effort: Pulmonary effort is normal.  Abdominal:     Palpations: Abdomen is soft.  Skin:    General: Skin is warm and dry.  Neurological:     Mental Status: She is alert and oriented to person, place, and time.  Psychiatric:        Behavior: Behavior normal.     Ortho Exam mild tenderness and prominence of the left sternoclavicular joint without evidence of anterior subluxation.  Acromioclavicular joints are normal.  Costochondral junctions are normal.  Upper EXTR lower extremity reflexes are 2+ and symmetrical.  Specialty Comments:  No specialty comments available.  Imaging: No results found.   PMFS History: Patient Active Problem List   Diagnosis Date Noted  . Pain in left leg 12/23/2020  . Incarcerated incisional hernia s/p lap repair w mesh 06/06/2020 06/06/2020  . Chronic respiratory failure with hypoxia ([place]) 04/01/2019  . Medication management 02/28/2019  . Dysphagia 12/30/2017  . Encounter for preoperative examination for general surgical procedure 03/06/2016  . Cough 08/02/2015  . Obstructive sleep apnea 08/06/2013  . COPD with acute bronchitis ([place]) 06/18/2013  . Myalgia 06/18/2013  . History of laparoscopic adjustable gastric banding, APS. 09/04/2008. 04/08/2011  . History of lung cancer 06/27/2010  . SKIN RASH 12/13/2009  . DYSPNEA 07/05/2008  . DIABETES, TYPE 2 07/01/2008  . Obesity, morbid ([place]) 07/01/2008  . COPD mixed type ([place]) 06/26/2008   Past Medical History:  Diagnosis Date  . Abdominal pain    around naval  . Arthritis   . Breast  pain in female   . Bruises easily   . Cancer ([place]) 2003   lung  . Cervical disc disease   . COPD (chronic obstructive pulmonary disease) ([place])    O2 as needed.  Marland Kitchen DJD (degenerative joint disease)   . Dyspnea   . Gum disease   . Hernia   . Hypoglycemic episode in patient with diabetes mellitus ([place]) 2021   Diet controlled diabetes with occasional hypoglycemia at night  . Hypothyroidism    no meds  . Insomnia   . On home O2    as needed  . Phlebitis   . Pre-diabetes   . Thyroid disease   . Wears glasses   . Weight increase     Family History  Problem Relation Age of Onset  . Stroke Mother   . Stroke Father     Past Surgical History:  Procedure Laterality Date  . ABDOMINAL HYSTERECTOMY     partial  . FOOT SURGERY     right  . LAPAROSCOPIC GASTRIC BANDING  Feb 2010  . LAPAROSCOPIC ROUX-EN-Y GASTRIC BYPASS WITH UPPER ENDOSCOPY AND REMOVAL OF LAP BAND  2018  . LEFT HEART CATHETERIZATION WITH CORONARY ANGIOGRAM N/A 05/02/2012   Procedure: LEFT HEART CATHETERIZATION WITH CORONARY ANGIOGRAM;  Surgeon: Laverda Page, MD;  Location: [facility] CATH LAB;  Service: Cardiovascular;  Laterality: N/A;  . LUNG LOBECTOMY  2003   left  . MASS EXCISION Right 06/06/2020   Procedure: REMOVAL MASS ON RIGHT BUTTOCK;  Surgeon: Michael Boston, MD;  Location: [facility] ORS;  Service: General;  Laterality: Right;  . SPINE SURGERY     fusion on the neck  . THYROIDECTOMY     partial  . TONSILLECTOMY    . VENTRAL HERNIA REPAIR N/A 06/06/2020   Procedure: LAPAROSCOPIC INCARCERATED INCISIONAL HERNIA REPAIR WITH MESH AND TAP BLOCK;  Surgeon: Michael Boston, MD;  Location: [facility] ORS;  Service: General;  Laterality: N/A;   Social History   Occupational History  . Not on file  Tobacco Use  . Smoking status: Former Smoker    Packs/day: 2.00    Years: 37.00    Pack years: 74.00    Types: Cigarettes    Quit date: 06/21/2002    Years since quitting: 18.5  . Smokeless tobacco: Never Used  Vaping Use  . Vaping  Use: Never used  Substance and Sexual Activity  . Alcohol use: No  . Drug use: No  . Sexual activity: Not on file

## 2020-12-23 DIAGNOSIS — M79605 Pain in left leg: Secondary | ICD-10-CM | POA: Insufficient documentation

## 2021-01-08 ENCOUNTER — Other Ambulatory Visit: Payer: Self-pay | Admitting: Family

## 2021-01-08 DIAGNOSIS — R0989 Other specified symptoms and signs involving the circulatory and respiratory systems: Secondary | ICD-10-CM

## 2021-01-09 ENCOUNTER — Encounter: Payer: Self-pay | Admitting: Internal Medicine

## 2021-01-09 DIAGNOSIS — L299 Pruritus, unspecified: Secondary | ICD-10-CM | POA: Insufficient documentation

## 2021-01-09 NOTE — Assessment & Plan Note (Signed)
COPD with mild exacerbation. Suspect recent heavy pollen. Plan- Depo 80, CBC w diff, IgE total, sample trial Breztri instead of Biomedical engineer

## 2021-01-09 NOTE — Assessment & Plan Note (Addendum)
Has O2 for sleep and seems to benefit- sleeps better with O2. Comfortable on room air during the day. Plan- continue sleep O2

## 2021-01-09 NOTE — Assessment & Plan Note (Signed)
Not sure if more than simple dry skin/ eczema. Plan- labs for allergy and ANA, try depo 80

## 2021-01-22 ENCOUNTER — Other Ambulatory Visit: Payer: BC Managed Care – PPO

## 2021-01-22 ENCOUNTER — Encounter: Payer: Self-pay | Admitting: Podiatry

## 2021-01-22 ENCOUNTER — Ambulatory Visit: Payer: Medicare Other | Admitting: Podiatry

## 2021-01-22 ENCOUNTER — Other Ambulatory Visit: Payer: Self-pay

## 2021-01-22 DIAGNOSIS — E1151 Type 2 diabetes mellitus with diabetic peripheral angiopathy without gangrene: Secondary | ICD-10-CM

## 2021-01-22 DIAGNOSIS — M79676 Pain in unspecified toe(s): Secondary | ICD-10-CM | POA: Diagnosis not present

## 2021-01-22 DIAGNOSIS — B351 Tinea unguium: Secondary | ICD-10-CM | POA: Diagnosis not present

## 2021-01-22 NOTE — Progress Notes (Signed)
This patient returns to my office for at risk foot care.  This patient requires this care by a professional since this patient will be at risk due to having type 2 diabetes. This patient is unable to cut nails herself since the patient cannot reach her nails.These nails are painful walking and wearing shoes.  This patient presents for at risk foot care today.  General Appearance  Alert, conversant and in no acute stress.  Vascular  Dorsalis pedis and posterior tibial  pulses are palpable  bilaterally.  Capillary return is within normal limits  bilaterally. Temperature is within normal limits  bilaterally.  Neurologic  Senn-Weinstein monofilament wire test within normal limits  bilaterally. Muscle power within normal limits bilaterally.  Nails Thick disfigured discolored nails with subungual debris  from hallux to fifth toes bilaterally. No evidence of bacterial infection or drainage bilaterally.  Orthopedic  No limitations of motion  feet .  No crepitus or effusions noted.  No bony pathology or digital deformities noted.  Skin  normotropic skin with no porokeratosis noted bilaterally.  No signs of infections or ulcers noted.     Onychomycosis  Pain in right toes  Pain in left toes  Consent was obtained for treatment procedures.   Mechanical debridement of nails 1-5  bilaterally performed with a nail nipper.  Filed with dremel without incident.    Return office visit    3 months                  Told patient to return for periodic foot care and evaluation due to potential at risk complications.   Gardiner Barefoot DPM

## 2021-01-23 ENCOUNTER — Ambulatory Visit
Admission: RE | Admit: 2021-01-23 | Discharge: 2021-01-23 | Disposition: A | Discharge status: Home / Self Care | Payer: BC Managed Care – PPO | Source: Ambulatory Visit | Attending: Family | Admitting: Family

## 2021-01-23 DIAGNOSIS — R0989 Other specified symptoms and signs involving the circulatory and respiratory systems: Secondary | ICD-10-CM

## 2021-02-04 ENCOUNTER — Ambulatory Visit: Payer: Medicare Other | Attending: Internal Medicine

## 2021-02-04 DIAGNOSIS — Z20822 Contact with and (suspected) exposure to covid-19: Secondary | ICD-10-CM

## 2021-02-05 LAB — NOVEL CORONAVIRUS, NAA: SARS-CoV-2, NAA: NOT DETECTED

## 2021-02-05 LAB — SPECIMEN STATUS REPORT

## 2021-02-05 LAB — SARS-COV-2, NAA 2 DAY TAT

## 2021-03-04 ENCOUNTER — Other Ambulatory Visit: Payer: Self-pay | Admitting: Internal Medicine

## 2021-03-04 DIAGNOSIS — J209 Acute bronchitis, unspecified: Secondary | ICD-10-CM

## 2021-03-20 DIAGNOSIS — E611 Iron deficiency: Secondary | ICD-10-CM | POA: Insufficient documentation

## 2021-03-20 DIAGNOSIS — Z8639 Personal history of other endocrine, nutritional and metabolic disease: Secondary | ICD-10-CM | POA: Insufficient documentation

## 2021-03-27 DIAGNOSIS — D509 Iron deficiency anemia, unspecified: Secondary | ICD-10-CM

## 2021-03-27 HISTORY — DX: Iron deficiency anemia, unspecified: D50.9

## 2021-04-08 ENCOUNTER — Ambulatory Visit: Payer: Medicare Other | Admitting: Podiatry

## 2021-05-21 ENCOUNTER — Ambulatory Visit: Payer: Medicare Other | Admitting: Podiatry

## 2021-05-28 ENCOUNTER — Encounter: Payer: Self-pay | Admitting: Podiatry

## 2021-05-28 ENCOUNTER — Other Ambulatory Visit: Payer: Self-pay

## 2021-05-28 ENCOUNTER — Ambulatory Visit (INDEPENDENT_AMBULATORY_CARE_PROVIDER_SITE_OTHER): Payer: Medicare Other | Admitting: Podiatry

## 2021-05-28 DIAGNOSIS — E1151 Type 2 diabetes mellitus with diabetic peripheral angiopathy without gangrene: Secondary | ICD-10-CM | POA: Diagnosis not present

## 2021-05-28 DIAGNOSIS — I83893 Varicose veins of bilateral lower extremities with other complications: Secondary | ICD-10-CM

## 2021-05-28 DIAGNOSIS — M79676 Pain in unspecified toe(s): Secondary | ICD-10-CM | POA: Diagnosis not present

## 2021-05-28 DIAGNOSIS — B351 Tinea unguium: Secondary | ICD-10-CM | POA: Diagnosis not present

## 2021-05-28 MED ORDER — KETOCONAZOLE 2 % EX CREA: 1.0000 "application " | TOPICAL_CREAM | Freq: Every day | CUTANEOUS | 2 refills | Status: DC

## 2021-05-28 NOTE — Progress Notes (Signed)
  Subjective:  Patient ID: [name], female    DOB: 09/10/1945,   MRN: [id number]  Chief Complaint  Patient presents with   Nail Problem    Diabetic foot care     75 y.o. female presents for diabetic routine foot care. Relates thick elongated and painful toenails that she cannot trim herself. Relates she is diabetic and last A1c was 6 on 03/18/21 .Relates some itching on the top of her feet that began about a month ago.  Denies any other pedal complaints. Denies n/v/f/c.   PCP Seward Carol MD   Past Medical History:  Diagnosis Date   Abdominal pain    around naval   Arthritis    Breast pain in female    Bruises easily    Cancer ([place]) 2003   lung   Cervical disc disease    COPD (chronic obstructive pulmonary disease) ([place])    O2 as needed.   DJD (degenerative joint disease)    Dyspnea    Gum disease    Hernia    Hypoglycemic episode in patient with diabetes mellitus ([place]) 2021   Diet controlled diabetes with occasional hypoglycemia at night   Hypothyroidism    no meds   Insomnia    On home O2    as needed   Phlebitis    Pre-diabetes    Thyroid disease    Wears glasses    Weight increase     Objective:  Physical Exam: Vascular: DP/PT pulses 2/4 bilateral. CFT <3 seconds. Normal hair growth on digits. No edema.  Skin. No lacerations or abrasions bilateral feet. Nails 1-5 are thickened discolored and elongated with subungual debris.  Musculoskeletal: MMT 5/5 bilateral lower extremities in DF, PF, Inversion and Eversion. Deceased ROM in DF of ankle joint.  Neurological: Sensation intact to light touch.   Assessment:   1. Pain due to onychomycosis of toenail   2. Type 2 diabetes mellitus with peripheral vascular disease (HCC)   3. Varicose veins of both legs with edema   4. Onychomycosis      Plan:  Patient was evaluated and treated and all questions answered. -Discussed and educated patient on diabetic foot care, especially with  regards to the  vascular, neurological and musculoskeletal systems.  -Stressed the importance of good glycemic control and the detriment of not  controlling glucose levels in relation to the foot. -Discussed supportive shoes at all times and checking feet regularly.  -Mechanically debrided all nails 1-5 bilateral using sterile nail nipper and filed with dremel without incident  -Discussed possible fungal infection causing itchiness vs neuropathy.  -Prescription for ketoconazole provided.  -Answered all patient questions -Patient to return  in 3 months for at risk foot care -Patient advised to call the office if any problems or questions arise in the meantime.   Lorenda Peck, DPM

## 2021-06-04 NOTE — Progress Notes (Signed)
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 FEV1 1.75/87%, FEV1/FVC 0.53, DLCO 49 Office Spirometry 07/04/2015-mild airway obstruction. FEV1/FVC 0.55 Office Spirometry 03/06/2016- mild obstructive airways disease-FVC 2.01/104%, FEV1 1.16/78%, ratio 0.58, FEF 25-75% 0.61/ 43% NPSG 07/03/13- AHI 5.3/ hr, weight 234 pounds PFT 05/03/17-mild obstructive airways disease, no response to bronchodilator.  Diffusion moderately reduced.  FEV1/FVC 0.59, DLCO 59% Walk Test O2 qualifying 06/05/21- desaturated to 87%, improving to 94% on pulse 2L -------------------------------------------------------------------------------------------------------------  518/22- 71  female former smoker followed for COPD, Chronic Hypoxic Resp Failure, history left lower lobe resection/chemotherapy 8119 NSCCa, complicated by DM 2, obesity / lap band surgery, Covid infection Jan 2022,  O2 2L/ [employer]   -Neb albuterol, [place] 100-50, Stiolto Respimat, Ventolin hfa, prednisone 2.5 mg daily maint for renal function,    Sampled Breztri Covid vax-3 [facility]- 4/13-  Eos 0.1, sed rate 13, IgE 3, ANA neg,  -----Dry cough, feels like something is stuck in throat. Wheezing "For an overweight old woman, I do all right". No acute complaint. Feels mucus sometimes in throat, but no mechanical block.  Liked Breztri sample- we will refill. Discussed alternative antihistamines.  06/05/21- 64  female former smoker followed for COPD, Chronic Hypoxic Resp Failure, history left lower lobe resection/chemotherapy 1478 NSCCa, complicated by DM 2, obesity / lap band surgery, Covid infection Jan 2022,  O2 2L/ [employer]   -Neb albuterol, Breztri , Ventolin hfa, prednisone 2.5 mg daily maint for renal function,      Covid vax-5 [facility] Flu vax-had Walk Test O2 qualifying 06/05/21- desaturated to 87%, improving to 94% on pulse 2L. She would like to change her oxygen supplier. Wheezing and dyspnea stable/controlled with current bronchodilators. CXR 12/06/20-  IMPRESSION: Chronic lung changes with redemonstration of surgical changes at the left hilum and no evidence of acute cardiopulmonary disease.  ROS-see HPI + = positive Constitutional:   No-   weight loss, night sweats, fevers, chills,+ fatigue, lassitude. HEENT:   No-  headaches, +difficulty swallowing, tooth/dental problems, sore throat,       No-  sneezing, itching, ear ache, +nasal congestion, post nasal drip,  CV:  No-   chest pain, orthopnea, PND, swelling in lower extremities, anasarca, dizziness, palpitations Resp: +  shortness of breath with exertion or at rest.        productive cough,  non-productive cough,   coughing up of blood.              No-   change in color of mucus. wheezing.   Skin: No-   rash or lesions. GI:  No-   heartburn, indigestion, abdominal pain, nausea, vomiting, GU: . MS:  No-   joint pain or swelling.   cramping myalgias Neuro-     nothing unusual Psych:  No- change in mood or affect. No depression or anxiety.  No memory loss.  OBJ General- Alert, Oriented, Affect-appropriate, Distress- none acute, +obese Skin- rash-none, lesions- none, excoriation- none.  Lymphadenopathy- none Head- atraumatic            Eyes- Gross vision intact, PERRLA, conjunctivae clear secretions            Ears- Hearing, canals-normal            Nose- Clear, no-Septal dev, mucus, polyps, erosion, perforation  Throat- Mallampati III-IV , mucosa , drainage- none, tonsils- atrophic Neck- flexible , trachea midline, no stridor , thyroid nl, carotid no bruit Chest - symmetrical excursion , unlabored           Heart/CV- RRR , no murmur , no gallop  , no rub, nl s1 s2                           - JVD- none , edema- none, stasis  changes- none, varices- none           Lung- clear, wheeze- none, cough-none , dullness-none, rub- none           Chest wall- scar left chest Port-A-Cath Abd-  Br/ Gen/ Rectal- Not done, not indicated Extrem- cyanosis- none, clubbing, none, atrophy- none, strength- nl Neuro- grossly intact to observation

## 2021-06-05 ENCOUNTER — Encounter: Payer: Self-pay | Admitting: Internal Medicine

## 2021-06-05 ENCOUNTER — Ambulatory Visit (INDEPENDENT_AMBULATORY_CARE_PROVIDER_SITE_OTHER): Payer: Medicare Other | Admitting: Internal Medicine

## 2021-06-05 ENCOUNTER — Other Ambulatory Visit: Payer: Self-pay

## 2021-06-05 VITALS — BP 132/72 | HR 71 | Temp 98.1°F | Ht 60.0 in | Wt 218.8 lb

## 2021-06-05 DIAGNOSIS — J449 Chronic obstructive pulmonary disease, unspecified: Secondary | ICD-10-CM | POA: Diagnosis not present

## 2021-06-05 DIAGNOSIS — J9611 Chronic respiratory failure with hypoxia: Secondary | ICD-10-CM | POA: Diagnosis not present

## 2021-06-05 MED ORDER — HYDROCOD POLST-CPM POLST ER 10-8 MG/5ML PO SUER: ORAL | 0 refills | Status: DC

## 2021-06-05 NOTE — Patient Instructions (Signed)
Refill script sent for Tussionex cough syrup  Order- POC O2 qualifying walk please    dx Chronic Hypoxic Respiratory Failure  [facility] requests change of home O2 provider from [employer]   dx Chronic Hypoxic Respiratory Failure            O2 2L pulse, for exertion sleep with O2 2l  Please call if we can help

## 2021-06-27 ENCOUNTER — Ambulatory Visit (INDEPENDENT_AMBULATORY_CARE_PROVIDER_SITE_OTHER): Payer: Medicare Other | Admitting: Orthopaedic Surgery

## 2021-06-27 ENCOUNTER — Ambulatory Visit: Payer: Self-pay

## 2021-06-27 ENCOUNTER — Other Ambulatory Visit: Payer: Self-pay

## 2021-06-27 VITALS — BP 136/85 | HR 64 | Ht 60.0 in | Wt 218.8 lb

## 2021-06-27 DIAGNOSIS — G8929 Other chronic pain: Secondary | ICD-10-CM | POA: Diagnosis not present

## 2021-06-27 DIAGNOSIS — M25561 Pain in right knee: Secondary | ICD-10-CM | POA: Diagnosis not present

## 2021-06-27 MED ORDER — BUPIVACAINE HCL 0.25 % IJ SOLN
4.0000 mL | INTRAMUSCULAR | Status: AC | PRN
Start: 2021-06-27 — End: 2021-06-27
  Administered 2021-06-27: 4 mL via INTRA_ARTICULAR

## 2021-06-27 MED ORDER — METHYLPREDNISOLONE ACETATE 40 MG/ML IJ SUSP
40.0000 mg | INTRAMUSCULAR | Status: AC | PRN
Start: 2021-06-27 — End: 2021-06-27
  Administered 2021-06-27: 40 mg via INTRA_ARTICULAR

## 2021-06-27 MED ORDER — LIDOCAINE HCL 1 % IJ SOLN
0.5000 mL | INTRAMUSCULAR | Status: AC | PRN
Start: 2021-06-27 — End: 2021-06-27
  Administered 2021-06-27: .5 mL

## 2021-06-27 NOTE — Progress Notes (Signed)
Office Visit Note   Patient: [name]           Date of Birth: Dec 04, 1945           MRN: [id number] Visit Date: 06/27/2021              Requested by: Seward Carol, MD [address] [place],  [place] [address] PCP: Seward Carol, MD   Assessment & Plan: Visit Diagnoses:  1. Chronic pain of right knee     Plan: Injection performed right knee she tolerated the injection well.  Return if she has persistent problems.   Follow-Up Instructions: No follow-ups on file.   Orders:  Orders Placed This Encounter  Procedures   XR KNEE 3 VIEW RIGHT   No orders of the defined types were placed in this encounter.     Procedures: Large Joint Inj: R knee on 06/27/2021 2:42 PM Indications: pain and joint swelling Details: 22 G 1.5 in needle, anterolateral approach  Arthrogram: No  Medications: 40 mg methylPREDNISolone acetate 40 MG/ML; 0.5 mL lidocaine 1 %; 4 mL bupivacaine 0.25 % Outcome: tolerated well, no immediate complications Procedure, treatment alternatives, risks and benefits explained, specific risks discussed. Consent was given by the patient. Immediately prior to procedure a time out was called to verify the correct patient, procedure, equipment, support staff and site/side marked as required. Patient was prepped and draped in the usual sterile fashion.      Clinical Data: No additional findings.   Subjective: Chief Complaint  Patient presents with   Right Knee - Pain    HPI patient is seen with significant right knee pain and is here requesting injection.  Previous shot in her shoulder also in her elbow in the distant past with good relief.  She states her knee has been swelling aching hurting does not recall any specific injury.  She does have type 2 diabetes under good control.  History of lung cancer IBS and sleep apnea and hypertension.  Negative for gout.  Review of Systems all other systems noncontributory to  HPI.   Objective: Vital Signs: BP 136/85   Pulse 64   Ht 5' (1.524 m)   Wt 218 lb 12.8 oz (99.2 kg)   BMI 42.73 kg/m   Physical Exam Constitutional:      Appearance: She is well-developed.  HENT:     Head: Normocephalic.     Right Ear: External ear normal.     Left Ear: External ear normal. There is no impacted cerumen.  Eyes:     Pupils: Pupils are equal, round, and reactive to light.  Neck:     Thyroid: No thyromegaly.     Trachea: No tracheal deviation.  Cardiovascular:     Rate and Rhythm: Normal rate.  Pulmonary:     Effort: Pulmonary effort is normal.  Abdominal:     Palpations: Abdomen is soft.  Musculoskeletal:     Cervical back: No rigidity.  Skin:    General: Skin is warm and dry.  Neurological:     Mental Status: She is alert and oriented to person, place, and time.  Psychiatric:        Behavior: Behavior normal.    Ortho Exam patient has crepitus with range of motion more medial than lateral joint line tenderness collateral ligaments are stable negative logroll the hips.  Specialty Comments:  No specialty comments available.  Imaging: No results found.   PMFS History: Patient Active Problem List   Diagnosis  Date Noted   Pruritus 01/09/2021   Pain in left leg 12/23/2020   Incarcerated incisional hernia s/p lap repair w mesh 06/06/2020 06/06/2020   Chronic respiratory failure with hypoxia ([place]) 04/01/2019   Medication management 02/28/2019   Combined forms of age-related cataract of both eyes 12/01/2018   Early stage nonexudative age-related macular degeneration of both eyes 12/01/2018   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   PVD (posterior vitreous detachment), left 11/28/2018   Dysphagia 12/30/2017   Asthma 04/12/2017   BMI 40.0-44.9, adult ([place]) 04/12/2017   Irritable bowel syndrome 04/12/2017   Osteopenia 29/52/8413   Umbilical hernia without obstruction and without gangrene 03/18/2017   Hiatal hernia 01/26/2017   Vitamin D  deficiency 06/16/2016   Encounter for preoperative examination for general surgical procedure 03/06/2016   Hypercholesterolemia 02/25/2016   Hypertensive disorder 02/25/2016   Cough 08/02/2015   Obstructive sleep apnea 08/06/2013   Neoplasm of breast 07/20/2013   COPD with acute bronchitis ([place]) 06/18/2013   Myalgia 06/18/2013   History of laparoscopic adjustable gastric banding, APS. 09/04/2008. 04/08/2011   History of lung cancer 06/27/2010   SKIN RASH 12/13/2009   DYSPNEA 07/05/2008   DIABETES, TYPE 2 07/01/2008   Obesity, morbid ([place]) 07/01/2008   Type 2 diabetes mellitus without complications ([place]) 24/40/1027   COPD mixed type ([place]) 06/26/2008   Malignant neoplasm of lung ([place]) 07/20/2001   Past Medical History:  Diagnosis Date   Abdominal pain    around naval   Arthritis    Breast pain in female    Bruises easily    Cancer ([place]) 2003   lung   Cervical disc disease    COPD (chronic obstructive pulmonary disease) ([place])    O2 as needed.   DJD (degenerative joint disease)    Dyspnea    Gum disease    Hernia    Hypoglycemic episode in patient with diabetes mellitus ([place]) 2021   Diet controlled diabetes with occasional hypoglycemia at night   Hypothyroidism    no meds   Insomnia    On home O2    as needed   Phlebitis    Pre-diabetes    Thyroid disease    Wears glasses    Weight increase     Family History  Problem Relation Age of Onset   Stroke Mother    Stroke Father     Past Surgical History:  Procedure Laterality Date   ABDOMINAL HYSTERECTOMY     partial   FOOT SURGERY     right   LAPAROSCOPIC GASTRIC BANDING  Feb 2010   LAPAROSCOPIC ROUX-EN-Y GASTRIC BYPASS WITH UPPER ENDOSCOPY AND REMOVAL OF LAP BAND  2018   LEFT HEART CATHETERIZATION WITH CORONARY ANGIOGRAM N/A 05/02/2012   Procedure: LEFT HEART CATHETERIZATION WITH CORONARY ANGIOGRAM;  Surgeon: Laverda Page, MD;  [facility] LAB;  Service: Cardiovascular;  Laterality: N/A;   LUNG  LOBECTOMY  2003   left   MASS EXCISION Right 06/06/2020   Procedure: REMOVAL MASS ON RIGHT BUTTOCK;  Surgeon: Michael Boston, MD;  Location: [facility] ORS;  Service: General;  Laterality: Right;   SPINE SURGERY     fusion on the neck   THYROIDECTOMY     partial   TONSILLECTOMY     VENTRAL HERNIA REPAIR N/A 06/06/2020   Procedure: LAPAROSCOPIC INCARCERATED INCISIONAL HERNIA REPAIR WITH MESH AND TAP BLOCK;  Surgeon: Michael Boston, MD;  Location: [facility]S;  Service: General;  Laterality: N/A;   Social History   Occupational History  Not on file  Tobacco Use   Smoking status: Former    Packs/day: 2.00    Years: 37.00    Pack years: 74.00    Types: Cigarettes    Quit date: 06/21/2002    Years since quitting: 19.0   Smokeless tobacco: Never  Vaping Use   Vaping Use: Never used  Substance and Sexual Activity   Alcohol use: No   Drug use: No   Sexual activity: Not on file

## 2021-07-16 ENCOUNTER — Telehealth: Payer: Self-pay | Admitting: Internal Medicine

## 2021-07-16 MED ORDER — AZITHROMYCIN 250 MG PO TABS: ORAL_TABLET | ORAL | 0 refills | Status: DC

## 2021-07-16 NOTE — Telephone Encounter (Signed)
Called and spoke with patient to let her know the recs from Dr. Annamaria Boots. She said she would do a covid test and call back if its positive. RX has been sent in. Nothing further needed at this time.

## 2021-07-16 NOTE — Telephone Encounter (Signed)
Called and spoke with patient who states that she has been ill since Monday. States she has had a very sore throat and losing her voice as well as a "bubbly stomach" no diarrhea just some gas. Clear mucous in nose but brown when coughing. No fevers.  Pharmacy is [facility] on [address].    Please advise.

## 2021-07-16 NOTE — Telephone Encounter (Signed)
Please offer Zpak, 250 mg, # 5, 2 today then one daily. Stay well hydrated. If this is viral, she will have to wait it out. Recommend Covid test.

## 2021-07-28 NOTE — Assessment & Plan Note (Signed)
Requalified for portable oxygen concentrator and she would like to change supplier. Plan-O2 2 L continuous at home, pulse concentrator for activity.

## 2021-07-28 NOTE — Assessment & Plan Note (Signed)
Bronchodilators are appropriate.  Medications discussed.

## 2021-08-06 ENCOUNTER — Ambulatory Visit: Payer: Medicare Other | Admitting: Podiatry

## 2021-09-03 ENCOUNTER — Ambulatory Visit: Payer: Medicare Other | Admitting: Podiatry

## 2021-09-05 ENCOUNTER — Encounter: Payer: Self-pay | Admitting: Podiatry

## 2021-09-05 ENCOUNTER — Ambulatory Visit: Payer: Medicare Other | Admitting: Podiatry

## 2021-09-05 ENCOUNTER — Other Ambulatory Visit: Payer: Self-pay

## 2021-09-05 DIAGNOSIS — L608 Other nail disorders: Secondary | ICD-10-CM | POA: Insufficient documentation

## 2021-09-05 DIAGNOSIS — B351 Tinea unguium: Secondary | ICD-10-CM

## 2021-09-05 DIAGNOSIS — M79676 Pain in unspecified toe(s): Secondary | ICD-10-CM | POA: Diagnosis not present

## 2021-09-05 DIAGNOSIS — E1151 Type 2 diabetes mellitus with diabetic peripheral angiopathy without gangrene: Secondary | ICD-10-CM | POA: Diagnosis not present

## 2021-09-05 NOTE — Progress Notes (Signed)
This patient returns to my office for at risk foot care.  This patient requires this care by a professional since this patient will be at risk due to having type 2 diabetes. This patient is unable to cut nails herself since the patient cannot reach her nails.These nails are painful walking and wearing shoes.  This patient presents for at risk foot care today.  General Appearance  Alert, conversant and in no acute stress.  Vascular  Dorsalis pedis and posterior tibial  pulses are palpable  bilaterally.  Capillary return is within normal limits  bilaterally. Temperature is within normal limits  bilaterally.  Neurologic  Senn-Weinstein monofilament wire test within normal limits  bilaterally. Muscle power within normal limits bilaterally.  Nails Thick disfigured discolored nails with subungual debris  from hallux to fifth toes bilaterally. No evidence of bacterial infection or drainage bilaterally.  Orthopedic  No limitations of motion  feet .  No crepitus or effusions noted.  No bony pathology or digital deformities noted.  Skin  normotropic skin with no porokeratosis noted bilaterally.  No signs of infections or ulcers noted.     Onychomycosis  Pain in right toes  Pain in left toes  Consent was obtained for treatment procedures.   Mechanical debridement of nails 1-5  bilaterally performed with a nail nipper.  Filed with dremel without incident.    Return office visit    3 months                  Told patient to return for periodic foot care and evaluation due to potential at risk complications.   Gardiner Barefoot DPM

## 2021-10-03 ENCOUNTER — Ambulatory Visit (INDEPENDENT_AMBULATORY_CARE_PROVIDER_SITE_OTHER): Payer: Medicare Other | Admitting: Orthopaedic Surgery

## 2021-10-03 ENCOUNTER — Ambulatory Visit (INDEPENDENT_AMBULATORY_CARE_PROVIDER_SITE_OTHER): Payer: Medicare Other

## 2021-10-03 ENCOUNTER — Encounter: Payer: Self-pay | Admitting: Orthopaedic Surgery

## 2021-10-03 ENCOUNTER — Ambulatory Visit: Payer: Self-pay

## 2021-10-03 ENCOUNTER — Other Ambulatory Visit: Payer: Self-pay

## 2021-10-03 VITALS — BP 126/81 | HR 67 | Ht 60.0 in | Wt 225.2 lb

## 2021-10-03 DIAGNOSIS — M79641 Pain in right hand: Secondary | ICD-10-CM

## 2021-10-03 DIAGNOSIS — M542 Cervicalgia: Secondary | ICD-10-CM | POA: Diagnosis not present

## 2021-10-03 DIAGNOSIS — M79642 Pain in left hand: Secondary | ICD-10-CM

## 2021-10-05 NOTE — Progress Notes (Signed)
? ?Office Visit Note ?  ?Patient: [name]           ?Date of Birth: [birth year]           ?MRN: [id number] ?Visit Date: 10/03/2021 ?             ?Requested by: Seward Carol, MD ?[address] ?[address] ?[place],  [place] [address] ?PCP: Seward Carol, MD ? ? ?Assessment & Plan: ?Visit Diagnoses:  ?1. Pain in left hand   ?2. Pain in right hand   ?3. Cervicalgia   ? ? ?Plan: Patient has degenerative changes above and below the previous cervical fusion which is 20+ years ago.  We will proceed with MRI scan to evaluate her for spinal stenosis with cord compression with her altered gait and balance problems as well as hand weakness. ? ?Follow-Up Instructions:  Follow-up after MRI scan. ? ?Orders:  ?Orders Placed This Encounter  ?Procedures  ? XR Hand Complete Right  ? XR Hand Complete Left  ? XR Cervical Spine 2 or 3 views  ? MR Cervical Spine w/o contrast  ? ?No orders of the defined types were placed in this encounter. ? ? ? ? Procedures: ?No procedures performed ? ? ?Clinical Data: ?No additional findings. ? ? ?Subjective: ?Chief Complaint  ?Patient presents with  ? Right Hand - Pain  ? Left Hand - Pain  ? ? ?HPI 76 year old female returns she had previous cervical fusion C4-C6 many years ago.  She has noted some cramping in her hands at times she has to manually straighten her fingers she does not have true triggering.  She has not noticed numbness but is noticed weakness in her arms and has had problems with balance and has to use cane or walker.  Past history of bilateral carpal tunnel release.  She has been using Tylenol arthritis. ? ?Positive for sleep apnea morbid obesity vitamin D deficiency type 2 diabetes.  Malignant neoplasm of the lung. ? ?Review of Systems all the systems noncontributory to HPI. ? ? ?Objective: ?Vital Signs: BP 126/81   Pulse 67   Ht 5' (1.524 m)   Wt 225 lb 3.2 oz (102.2 kg)   BMI 43.98 kg/m?  ? ?Physical Exam ?Constitutional:   ?   Appearance: She is well-developed.   ?HENT:  ?   Head: Normocephalic.  ?   Right Ear: External ear normal.  ?   Left Ear: External ear normal. There is no impacted cerumen.  ?Eyes:  ?   Pupils: Pupils are equal, round, and reactive to light.  ?Neck:  ?   Thyroid: No thyromegaly.  ?   Trachea: No tracheal deviation.  ?Cardiovascular:  ?   Rate and Rhythm: Normal rate.  ?Pulmonary:  ?   Effort: Pulmonary effort is normal.  ?Abdominal:  ?   Palpations: Abdomen is soft.  ?Musculoskeletal:  ?   Cervical back: No rigidity.  ?Skin: ?   General: Skin is warm and dry.  ?Neurological:  ?   Mental Status: She is alert and oriented to person, place, and time.  ?Psychiatric:     ?   Behavior: Behavior normal.  ? ? ?Ortho Exam healed left side cervical transverse incision.  Patient has bilateral brachial plexus tenderness 50% rotation.  Forward flexion 3 fingerbreadths chin to chest.  Upper extremity reflexes are symmetrical.  No lower extremity clonus decreased balance and slow deliberate short stride gait.  No lower extremity hyperreflexia. ? ?Specialty Comments:  ?No specialty comments available. ? ?  Imaging: ?No results found. ? ? ?PMFS History: ?Patient Active Problem List  ? Diagnosis Date Noted  ? Pincer nail deformity 09/05/2021  ? Pruritus 01/09/2021  ? Pain in left leg 12/23/2020  ? Incarcerated incisional hernia s/p lap repair w mesh 06/06/2020 06/06/2020  ? Chronic respiratory failure with hypoxia ([place]) 04/01/2019  ? Medication management 02/28/2019  ? Combined forms of age-related cataract of both eyes 12/01/2018  ? Early stage nonexudative age-related macular degeneration of both eyes 12/01/2018  ? 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  ? PVD (posterior vitreous detachment), left 11/28/2018  ? Dysphagia 12/30/2017  ? Asthma 04/12/2017  ? BMI 40.0-44.9, adult ([place]) 04/12/2017  ? Irritable bowel syndrome 04/12/2017  ? Osteopenia 04/12/2017  ? Umbilical hernia without obstruction and without gangrene 03/18/2017  ? Hiatal hernia 01/26/2017  ? Vitamin D  deficiency 06/16/2016  ? Encounter for preoperative examination for general surgical procedure 03/06/2016  ? Hypercholesterolemia 02/25/2016  ? Hypertensive disorder 02/25/2016  ? Cough 08/02/2015  ? Obstructive sleep apnea 08/06/2013  ? Neoplasm of breast 07/20/2013  ? COPD with acute bronchitis ([place]) 06/18/2013  ? Myalgia 06/18/2013  ? History of laparoscopic adjustable gastric banding, APS. 09/04/2008. 04/08/2011  ? History of lung cancer 06/27/2010  ? SKIN RASH 12/13/2009  ? DYSPNEA 07/05/2008  ? DIABETES, TYPE 2 07/01/2008  ? Obesity, morbid ([place]) 07/01/2008  ? Type 2 diabetes mellitus without complications ([place]) 34/19/3790  ? COPD mixed type ([place]) 06/26/2008  ? Malignant neoplasm of lung ([place]) 07/20/2001  ? ?Past Medical History:  ?Diagnosis Date  ? Abdominal pain   ? around naval  ? Arthritis   ? Breast pain in female   ? Bruises easily   ? Cancer [facility]) 2003  ? lung  ? Cervical disc disease   ? COPD (chronic obstructive pulmonary disease) ([place])   ? O2 as needed.  ? DJD (degenerative joint disease)   ? Dyspnea   ? Gum disease   ? Hernia   ? Hypoglycemic episode in patient with diabetes mellitus ([place]) 2021  ? Diet controlled diabetes with occasional hypoglycemia at night  ? Hypothyroidism   ? no meds  ? Insomnia   ? On home O2   ? as needed  ? Phlebitis   ? Pre-diabetes   ? Thyroid disease   ? Wears glasses   ? Weight increase   ?  ?Family History  ?Problem Relation Age of Onset  ? Stroke Mother   ? Stroke Father   ?  ?Past Surgical History:  ?Procedure Laterality Date  ? ABDOMINAL HYSTERECTOMY    ? partial  ? FOOT SURGERY    ? right  ? LAPAROSCOPIC GASTRIC BANDING  Feb 2010  ? LAPAROSCOPIC ROUX-EN-Y GASTRIC BYPASS WITH UPPER ENDOSCOPY AND REMOVAL OF LAP BAND  2018  ? LEFT HEART CATHETERIZATION WITH CORONARY ANGIOGRAM N/A 05/02/2012  ? Procedure: LEFT HEART CATHETERIZATION WITH CORONARY ANGIOGRAM;  Surgeon: Laverda Page, MD;  Location[facility] CATH LAB;  Service: Cardiovascular;  Laterality: N/A;  ? LUNG  LOBECTOMY  2003  ? left  ? MASS EXCISION Right 06/06/2020  ? Procedure: REMOVAL MASS ON RIGHT BUTTOCK;  Surgeon: Michael Boston, MD;  Location: [facility]S;  Service: General;  Laterality: Right;  ? SPINE SURGERY    ? fusion on the neck  ? THYROIDECTOMY    ? partial  ? TONSILLECTOMY    ? VENTRAL HERNIA REPAIR N/A 06/06/2020  ? Procedure: LAPAROSCOPIC INCARCERATED INCISIONAL HERNIA REPAIR WITH MESH AND TAP BLOCK;  Surgeon:  Michael Boston, MD;  Location: [facility];  Service: General;  Laterality: N/A;  ? ?Social History  ? ?Occupational History  ? Not on file  ?Tobacco Use  ? Smoking status: Former  ?  Packs/day: 2.00  ?  Years: 37.00  ?  Pack years: 74.00  ?  Types: Cigarettes  ?  Quit date: 06/21/2002  ?  Years since quitting: 19.3  ? Smokeless tobacco: Never  ?Vaping Use  ? Vaping Use: Never used  ?Substance and Sexual Activity  ? Alcohol use: No  ? Drug use: No  ? Sexual activity: Not on file  ? ? ? ? ? ? ?

## 2021-10-13 ENCOUNTER — Telehealth: Payer: Self-pay | Admitting: Orthopaedic Surgery

## 2021-10-13 NOTE — Telephone Encounter (Signed)
Called pt 1X and left vm for pt to set an MRI review appt with Dr. Lorin Mercy after 10/30/21 ?

## 2021-10-14 ENCOUNTER — Telehealth: Payer: Self-pay | Admitting: Orthopaedic Surgery

## 2021-10-14 NOTE — Telephone Encounter (Signed)
Called pt 2X and left vm to call and schedule an MRI Review appt with Dr. Lorin Mercy after 4/13 ?

## 2021-10-30 ENCOUNTER — Other Ambulatory Visit: Payer: Self-pay | Admitting: Internal Medicine

## 2021-10-30 ENCOUNTER — Ambulatory Visit
Admission: RE | Admit: 2021-10-30 | Discharge: 2021-10-30 | Disposition: A | Discharge status: Home / Self Care | Payer: Medicare Other | Source: Ambulatory Visit | Attending: Orthopaedic Surgery | Admitting: Orthopaedic Surgery

## 2021-10-30 DIAGNOSIS — M542 Cervicalgia: Secondary | ICD-10-CM

## 2021-10-31 ENCOUNTER — Telehealth: Payer: Self-pay

## 2021-10-31 NOTE — Telephone Encounter (Signed)
EKG AND REFERRAL SCANNED TO REFERRAL ?

## 2021-11-04 ENCOUNTER — Ambulatory Visit: Payer: Medicare Other | Admitting: Orthopaedic Surgery

## 2021-11-04 DIAGNOSIS — M4802 Spinal stenosis, cervical region: Secondary | ICD-10-CM | POA: Diagnosis not present

## 2021-11-04 NOTE — Progress Notes (Signed)
? ?Office Visit Note ?  ?Patient: [name]           ?Date of Birth: 1945/09/22           ?MRN: [id number] ?Visit Date: 11/04/2021 ?             ?Requested by: Seward Carol, MD ?[address] ?[address] ?[place],  [place] [address] ?PCP: Seward Carol, MD ? ? ?Assessment & Plan: ?Visit Diagnoses:  ?1. Spinal stenosis of cervical region   ? ? ?Plan: We reviewed her MRI scan she has had progressive balance problems but primarily numbness and tingling in her arms worse in the right hand the left arm.  Patient's had disc degeneration above her solid fusion with cord flattening moderate spinal stenosis left greater than right compression bilateral facet hypertrophy and moderate foraminal stenosis which is progressed since her 2015 MRI scan.  We discussed the previous solid fusion that she has and some disc degenerative changes at C6-7 as well as C7-T1.  With those additional changes in her solid two-level fusion I would recommend a disc arthroplasty at C3-4 which would decompress her spinal canal but still save her some motion.  She does not have any myelopathic changes in the cord fortunately.  We looked at a model of cervical fusion discussed operative technique overnight stay in the hospital use of a soft collar for short period of time maybe a week.  Procedure discussed questions were elicited and answered she understands and would like to proceed. ? ?Follow-Up Instructions: No follow-ups on file.  ? ?Orders:  ?No orders of the defined types were placed in this encounter. ? ?No orders of the defined types were placed in this encounter. ? ? ? ? Procedures: ?No procedures performed ? ? ?Clinical Data: ?No additional findings. ? ? ?Subjective: ?Chief Complaint  ?Patient presents with  ? Neck - Follow-up  ?  MRI cervical spine review  ? ? ?HPI follow-up cervical MRI scan with ongoing problems with neck pain post previous two-level cervical Cloward fusion C4-C6 15-20+ years ago at least.  She has had  cramping in her hands that bothers her on a daily basis difficulty straightening her fingers.  Numbness and tingling and decreased strength worse in her right arm the left arm she has had to use a cane or walker is having problems ambulating due to balance problems.  Malignancy neoplasm of the lung sleep apnea obesity vitamin D deficiency type 2 diabetes additional medical problems that she has.  Plain radiographs of her neck showed spondylitic spurring changes at C3-4 above her solid fusion to level fusion.  MRI scan has been obtained and is here for review. ? ?Review of Systems ? ? ?Objective: ?Vital Signs: BP (!) 148/82   Pulse 93   Ht 5' (1.524 m)   Wt 225 lb (102.1 kg)   BMI 43.94 kg/m?  ? ?Physical Exam ?Constitutional:   ?   Appearance: She is well-developed.  ?HENT:  ?   Head: Normocephalic.  ?   Right Ear: External ear normal.  ?   Left Ear: External ear normal. There is no impacted cerumen.  ?Eyes:  ?   Pupils: Pupils are equal, round, and reactive to light.  ?Neck:  ?   Thyroid: No thyromegaly.  ?   Trachea: No tracheal deviation.  ?Cardiovascular:  ?   Rate and Rhythm: Normal rate.  ?Pulmonary:  ?   Effort: Pulmonary effort is normal.  ?Abdominal:  ?   Palpations: Abdomen  is soft.  ?Musculoskeletal:  ?   Cervical back: No rigidity.  ?Skin: ?   General: Skin is warm and dry.  ?Neurological:  ?   Mental Status: She is alert and oriented to person, place, and time.  ?Psychiatric:     ?   Behavior: Behavior normal.  ? ? ?Ortho Exam ? ?Specialty Comments:  ?No specialty comments available. ? ?Imaging: ?Narrative & Impression  ?CLINICAL DATA:  Chronic neck pain.  Prior cervical fusion ?  ?EXAM: ?MRI CERVICAL SPINE WITHOUT CONTRAST ?  ?TECHNIQUE: ?Multiplanar, multisequence MR imaging of the cervical spine was ?performed. No intravenous contrast was administered. ?  ?COMPARISON:  MRI cervical spine 06/27/2014. Cervical spine ?radiographs 10/03/2021 ?  ?FINDINGS: ?Alignment: Straightening of the cervical  lordosis. Mild ?anterolisthesis C6-7 and C7-T1 ?  ?Vertebrae: Solid interbody fusion C4-5 and C5-6 without hardware. No ?fracture or mass. ?  ?Cord: Cord evaluation limited by motion throughout the study. ?Allowing for motion, no cord signal abnormality is identified. ?  ?Posterior Fossa, vertebral arteries, paraspinal tissues: Negative ?  ?Disc levels: ?  ?C2-3: Mild disc degeneration and spurring. Left facet degeneration. ?Mild left foraminal narrowing. ?  ?C3-4: Disc degeneration with diffuse uncinate spurring. Prominent ?central disc and osteophyte complex causing cord flattening and ?moderate spinal stenosis, left greater than right. Bilateral facet ?hypertrophy. Moderate foraminal narrowing bilaterally left greater ?than right. Stenosis has progressed since 2015 MRI. ?  ?C4-5: Solid interbody fusion. Central osteophyte with cord ?flattening and mild spinal stenosis. No interval change. Neural ?foramina patent bilaterally ?  ?C5-6: Solid interbody fusion. Central osteophyte with mild spinal ?stenosis. No interval change. Neural foramina patent bilaterally ?  ?C6-7: Disc degeneration with mild uncinate spurring. Mild facet ?degeneration. Mild foraminal narrowing bilaterally ?  ?C7-T1: Anterolisthesis. Disc degeneration and spurring. Broad-based ?central disc protrusion and bilateral facet degeneration. Moderate ?foraminal stenosis bilaterally, right greater than left. ?  ?Disc degeneration and spurring throughout the upper cervical spine. ?Moderate large central disc protrusion T4-5 touching the cord but ?not causing significant spinal stenosis. ?  ?IMPRESSION: ?1. Motion degraded study. This significantly limits evaluation for ?cord signal abnormality. No definite cord signal abnormality is ?identified. ?2. Progressive spinal stenosis at C3-4 with moderate foraminal ?stenosis bilaterally. ?3. Solid fusion C4-5 and C5-6 without significant stenosis ?4. Mild foraminal narrowing bilaterally C6-7. Moderate  foraminal ?narrowing bilaterally at C7-T1. ?  ?  ?Electronically Signed ?  By: Franchot Gallo M.D. ?  On: 10/30/2021 16:38  ? ? ? ?PMFS History: ?Patient Active Problem List  ? Diagnosis Date Noted  ? Spinal stenosis of cervical region 11/05/2021  ? Pincer nail deformity 09/05/2021  ? Pruritus 01/09/2021  ? Pain in left leg 12/23/2020  ? Incarcerated incisional hernia s/p lap repair w mesh 06/06/2020 06/06/2020  ? Chronic respiratory failure with hypoxia ([place]) 04/01/2019  ? Medication management 02/28/2019  ? Combined forms of age-related cataract of both eyes 12/01/2018  ? Early stage nonexudative age-related macular degeneration of both eyes 12/01/2018  ? 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  ? PVD (posterior vitreous detachment), left 11/28/2018  ? Dysphagia 12/30/2017  ? Asthma 04/12/2017  ? BMI 40.0-44.9, adult ([place]) 04/12/2017  ? Irritable bowel syndrome 04/12/2017  ? Osteopenia 04/12/2017  ? Umbilical hernia without obstruction and without gangrene 03/18/2017  ? Hiatal hernia 01/26/2017  ? Vitamin D deficiency 06/16/2016  ? Encounter for preoperative examination for general surgical procedure 03/06/2016  ? Hypercholesterolemia 02/25/2016  ? Hypertensive disorder 02/25/2016  ? Cough 08/02/2015  ? Obstructive sleep  apnea 08/06/2013  ? Neoplasm of breast 07/20/2013  ? COPD with acute bronchitis ([place]) 06/18/2013  ? Myalgia 06/18/2013  ? History of laparoscopic adjustable gastric banding, APS. 09/04/2008. 04/08/2011  ? History of lung cancer 06/27/2010  ? SKIN RASH 12/13/2009  ? DYSPNEA 07/05/2008  ? DIABETES, TYPE 2 07/01/2008  ? Obesity, morbid ([place]) 07/01/2008  ? Type 2 diabetes mellitus without complications ([place]) 53/66/4403  ? COPD mixed type ([place]) 06/26/2008  ? Malignant neoplasm of lung ([place]) 07/20/2001  ? ?Past Medical History:  ?Diagnosis Date  ? Abdominal pain   ? around naval  ? Arthritis   ? Breast pain in female   ? Bruises easily   ? Cancer [facility]) 2003  ? lung  ? Cervical disc disease   ? COPD  (chronic obstructive pulmonary disease) ([place])   ? O2 as needed.  ? DJD (degenerative joint disease)   ? Dyspnea   ? Gum disease   ? Hernia   ? Hypoglycemic episode in patient with diabetes mellitus ([place]) 2021  ? Diet controlle

## 2021-11-05 DIAGNOSIS — M4802 Spinal stenosis, cervical region: Secondary | ICD-10-CM | POA: Insufficient documentation

## 2021-11-11 ENCOUNTER — Encounter: Payer: Self-pay | Admitting: Cardiology

## 2021-11-11 ENCOUNTER — Ambulatory Visit: Payer: Medicare Other | Admitting: Cardiology

## 2021-11-11 VITALS — BP 131/77 | HR 64 | Temp 98.5°F | Resp 16 | Ht 60.0 in | Wt 226.0 lb

## 2021-11-11 DIAGNOSIS — Z85118 Personal history of other malignant neoplasm of bronchus and lung: Secondary | ICD-10-CM

## 2021-11-11 DIAGNOSIS — I10 Essential (primary) hypertension: Secondary | ICD-10-CM

## 2021-11-11 DIAGNOSIS — R072 Precordial pain: Secondary | ICD-10-CM

## 2021-11-11 DIAGNOSIS — R9431 Abnormal electrocardiogram [ECG] [EKG]: Secondary | ICD-10-CM

## 2021-11-11 DIAGNOSIS — E782 Mixed hyperlipidemia: Secondary | ICD-10-CM

## 2021-11-11 MED ORDER — ROSUVASTATIN CALCIUM 10 MG PO TABS: 10.0000 mg | ORAL_TABLET | Freq: Every day | ORAL | 0 refills | Status: DC

## 2021-11-11 MED ORDER — ASPIRIN EC 81 MG PO TBEC: 81.0000 mg | DELAYED_RELEASE_TABLET | Freq: Every day | ORAL | 11 refills | Status: DC

## 2021-11-11 MED ORDER — NITROGLYCERIN 0.4 MG SL SUBL: 0.4000 mg | SUBLINGUAL_TABLET | SUBLINGUAL | 0 refills | Status: DC | PRN

## 2021-11-11 MED ORDER — OLMESARTAN MEDOXOMIL-HCTZ 20-12.5 MG PO TABS: 1.0000 | ORAL_TABLET | Freq: Every day | ORAL | 0 refills | Status: DC

## 2021-11-11 NOTE — Progress Notes (Signed)
? ?ID:  [name], DOB 12/14/1945, MRN [id number] ? ?PCP:  Antony Blackbird, MD  ?Cardiologist:  Rex Kras, DO, [facility] (established care 11/11/2021) ?Former Cardiology Providers: Dr. Adrian Prows ? ?REASON FOR CONSULT: Abnormal EKG.  ? ?REQUESTING PHYSICIAN:  ?Seward Carol, MD ?[address] ?[address] ?[place] [address] ? ?Chief Complaint  ?Patient presents with  ? Abnormal ECG  ? Chest Pain  ? New Patient (Initial Visit)  ? ? ?HPI  ?[name] is a 76 y.o. African-American female whose past medical history and cardiovascular risk factors include: Prediabetes, hypertension, hyperlipidemia, obesity due to excess calories, history of gastric banding/gastric bypass surgery ? ?She is referred to the office at the request of Seward Carol, MD for evaluation of abnormal EKG. ? ?Abnormal EKG: Chest pain. ?Patient states that she has been having precordial discomfort for the last 3 months, intermittently present, tightness like sensation, no improving or worsening factors, when the symptoms arise she takes a full dose aspirin and the symptoms usually subside thereafter.  For reasons unknown she has not gone to the ED or seek medical attention sooner.  She recently establish care with PCP and was noted to have an abnormal EKG and now is referred to the cardiology for further evaluation and management. ? ?She denies exertional chest pain but does have exertional shortness of breath which is contributory to possible oxygen dependent COPD. ? ?Patient does not exercise given her pulmonary status from a COPD standpoint and the need for oxygen with activity. ? ?Patient used to follow with our practice last office visit was in 2019 and was asked to follow-up on as-needed basis.  She is accompanied by her daughter [name] who also provides collateral history. ? ?FUNCTIONAL STATUS: ?No structured exercise program or daily routine. ? ?ALLERGIES: ?No Known Allergies ? ?MEDICATION LIST PRIOR TO VISIT: ?Current  Meds  ?Medication Sig  ? acetaminophen (TYLENOL) 650 MG CR tablet Take 1,300 mg by mouth every 8 (eight) hours as needed for pain.  ? albuterol (PROVENTIL) (2.5 MG/3ML) 0.083% nebulizer solution USE 1 VIAL IN NEBULIZER EVERY 8 HOURS AS NEEDED FOR WHEEZING OR SHORTNESS OF BREATH  ? albuterol (VENTOLIN HFA) 108 (90 Base) MCG/ACT inhaler TAKE 2 PUFFS BY MOUTH EVERY 6 HOURS AS NEEDED FOR WHEEZE OR SHORTNESS OF BREATH  ? aspirin EC 81 MG tablet Take 1 tablet (81 mg total) by mouth daily. Swallow whole.  ? Biotin 5000 MCG TABS Take 5,000 mcg by mouth daily.   ? Budeson-Glycopyrrol-Formoterol (BREZTRI AEROSPHERE) 160-9-4.8 MCG/ACT AERO Inhale 2 puffs into the lungs 2 (two) times daily.  ? Calcium Carb-Cholecalciferol 600-800 MG-UNIT TABS calcium carbonate 600 mg-vitamin D3 20 mcg (800 unit) tablet ? TAKE 1 TABLET BY MOUTH TWICE A DAY  ? cetirizine (ZYRTEC) 10 MG tablet Take 10 mg by mouth daily.  ? chlorpheniramine-HYDROcodone (TUSSIONEX PENNKINETIC ER) 10-8 MG/5ML SUER 5 ml twice daily if needed for cough  ? diphenhydrAMINE (BENADRYL) 25 mg capsule Take 50 mg by mouth daily.  ? FREESTYLE INSULINX TEST test strip AS DIRECTED TO OBTAIN BLOOD TWICE A DAY AS NEEDED DX CODE E11.69 90 DAYS  ? glucose 4 GM chewable tablet Chew 20-24 g by mouth as needed for low blood sugar.  ? ketoconazole (NIZORAL) 2 % cream Apply 1 application topically daily. (Patient taking differently: Apply 1 application. topically daily as needed.)  ? Lancets (FREESTYLE) lancets   ? Magnesium 400 MG TABS Take 400 mg by mouth 2 (two) times daily.  ? Melatonin  10 MG CAPS Take 10 mg by mouth at bedtime as needed (sleep).  ? Multiple Vitamin (MULTIVITAMIN) tablet Take 1 tablet by mouth daily.  ? Nebulizers (COMPRESSOR/NEBULIZER) MISC Use as directed  ? nitroGLYCERIN (NITROSTAT) 0.4 MG SL tablet Place 1 tablet (0.4 mg total) under the tongue every 5 (five) minutes as needed for chest pain. If you require more than two tablets five minutes apart go to the  nearest ER via EMS.  ? olmesartan-hydrochlorothiazide (BENICAR HCT) 20-12.5 MG tablet Take 1 tablet by mouth daily.  ? Polyethyl Glycol-Propyl Glycol (SYSTANE OP) Place 1 drop into both eyes daily as needed (dry eyes).  ? rosuvastatin (CRESTOR) 10 MG tablet Take 1 tablet (10 mg total) by mouth at bedtime.  ? ?Current Facility-Administered Medications for the 11/11/21 encounter (Office Visit) with Rex Kras, DO  ?Medication  ? methylPREDNISolone acetate (DEPO-MEDROL) injection 80 mg  ?  ? ?PAST MEDICAL HISTORY: ?Past Medical History:  ?Diagnosis Date  ? Abdominal pain   ? around naval  ? Arthritis   ? Breast pain in female   ? Bruises easily   ? Cancer [facility]) 2003  ? lung  ? Cervical disc disease   ? COPD (chronic obstructive pulmonary disease) ([place])   ? O2 as needed.  ? DJD (degenerative joint disease)   ? Dyspnea   ? Gum disease   ? Hernia   ? Hypoglycemic episode in patient with diabetes mellitus ([place]) 2021  ? Diet controlled diabetes with occasional hypoglycemia at night  ? Hypothyroidism   ? no meds  ? Insomnia   ? On home O2   ? as needed  ? Phlebitis   ? Pre-diabetes   ? Thyroid disease   ? Wears glasses   ? Weight increase   ? ? ?PAST SURGICAL HISTORY: ?Past Surgical History:  ?Procedure Laterality Date  ? ABDOMINAL HYSTERECTOMY    ? partial  ? FOOT SURGERY    ? right  ? LAPAROSCOPIC GASTRIC BANDING  Feb 2010  ? LAPAROSCOPIC ROUX-EN-Y GASTRIC BYPASS WITH UPPER ENDOSCOPY AND REMOVAL OF LAP BAND  2018  ? LEFT HEART CATHETERIZATION WITH CORONARY ANGIOGRAM N/A 05/02/2012  ? Procedure: LEFT HEART CATHETERIZATION WITH CORONARY ANGIOGRAM;  Surgeon: Laverda Page, MD;  Location: [facility] CATH LAB;  Service: Cardiovascular;  Laterality: N/A;  ? LUNG LOBECTOMY  2003  ? left  ? MASS EXCISION Right 06/06/2020  ? Procedure: REMOVAL MASS ON RIGHT BUTTOCK;  Surgeon: Michael Boston, MD;  Location: [facility] ORS;  Service: General;  Laterality: Right;  ? SPINE SURGERY    ? fusion on the neck  ? THYROIDECTOMY    ? partial  ?  TONSILLECTOMY    ? VENTRAL HERNIA REPAIR N/A 06/06/2020  ? Procedure: LAPAROSCOPIC INCARCERATED INCISIONAL HERNIA REPAIR WITH MESH AND TAP BLOCK;  Surgeon: Michael Boston, MD;  Location: [facility] ORS;  Service: General;  Laterality: N/A;  ? ? ?FAMILY HISTORY: ?The patient family history includes Stroke in her father and mother. ? ?SOCIAL HISTORY:  ?The patient  reports that she quit smoking about 19 years ago. Her smoking use included cigarettes. She has a 74.00 pack-year smoking history. She has never used smokeless tobacco. She reports current alcohol use. She reports that she does not use drugs. ? ?REVIEW OF SYSTEMS: ?Review of Systems  ?Cardiovascular:  Positive for chest pain and dyspnea on exertion. Negative for leg swelling, palpitations and syncope.  ?     Leg pain -not consistent with claudication  ?Respiratory:  Positive for shortness of  breath.   ?Musculoskeletal:  Positive for arthritis, back pain and joint pain.  ? ?PHYSICAL EXAM: ? ?  11/11/2021  ? 12:56 PM 11/11/2021  ? 12:55 PM 11/11/2021  ? 12:46 PM  ?Vitals with BMI  ?Height   5\' 0"   ?Weight   226 lbs  ?BMI   44.14  ?Systolic 432 761 470  ?Diastolic 77 93 929  ?Pulse 64  65  ? ? ?CONSTITUTIONAL: Well-developed and well-nourished. No acute distress.  Walks with a walker and supplemental oxygen ?SKIN: Skin is warm and dry. No rash noted. No cyanosis. No pallor. No jaundice ?HEAD: Normocephalic and atraumatic.  ?EYES: No scleral icterus ?MOUTH/THROAT: Moist oral membranes.  ?NECK: No JVD present. No thyromegaly noted. No carotid bruits  ?LYMPHATIC: No visible cervical adenopathy.  ?CHEST Normal respiratory effort. No intercostal retractions  ?LUNGS: Clear to auscultation bilaterally.  No stridor. No wheezes. No rales.  ?CARDIOVASCULAR: Regular rate and rhythm, positive S1-S2, no murmurs rubs or gallops appreciated. ?ABDOMINAL: Obese, soft, nontender, nondistended, positive bowel sounds all 4 quadrants no apparent ascites.  ?EXTREMITIES: + Bilateral peripheral  edema, warm to touch, 1+ bilateral DP and PT pulses ?HEMATOLOGIC: No significant bruising ?NEUROLOGIC: Oriented to person, place, and time. Nonfocal. Normal muscle tone.  ?PSYCHIATRIC: Normal mood and affect. Normal

## 2021-11-19 ENCOUNTER — Other Ambulatory Visit: Payer: Self-pay | Admitting: Family Medicine

## 2021-11-19 ENCOUNTER — Other Ambulatory Visit: Payer: Medicare Other

## 2021-11-19 DIAGNOSIS — R1012 Left upper quadrant pain: Secondary | ICD-10-CM

## 2021-11-20 ENCOUNTER — Ambulatory Visit
Admission: RE | Admit: 2021-11-20 | Discharge: 2021-11-20 | Disposition: A | Discharge status: Home / Self Care | Payer: Medicare Other | Source: Ambulatory Visit | Attending: *Deleted | Admitting: *Deleted

## 2021-11-20 ENCOUNTER — Other Ambulatory Visit: Payer: Medicare Other

## 2021-11-20 DIAGNOSIS — R1012 Left upper quadrant pain: Secondary | ICD-10-CM

## 2021-11-21 ENCOUNTER — Other Ambulatory Visit: Payer: Self-pay | Admitting: Cardiology

## 2021-11-22 LAB — BASIC METABOLIC PANEL
BUN/Creatinine Ratio: 16 (ref 12–28)
BUN: 19 mg/dL (ref 8–27)
CO2: 23 mmol/L (ref 20–29)
Calcium: 9.2 mg/dL (ref 8.7–10.3)
Chloride: 102 mmol/L (ref 96–106)
Creatinine, Ser: 1.17 mg/dL — ABNORMAL HIGH (ref 0.57–1.00)
Glucose: 86 mg/dL (ref 70–99)
Potassium: 4.3 mmol/L (ref 3.5–5.2)
Sodium: 143 mmol/L (ref 134–144)
eGFR: 48 mL/min/{1.73_m2} — ABNORMAL LOW (ref >59)

## 2021-11-22 LAB — MAGNESIUM: Magnesium: 2.3 mg/dL (ref 1.6–2.3)

## 2021-11-24 ENCOUNTER — Other Ambulatory Visit: Payer: Self-pay | Admitting: *Deleted

## 2021-11-24 ENCOUNTER — Encounter: Payer: Self-pay | Admitting: Podiatrist

## 2021-11-24 ENCOUNTER — Ambulatory Visit (INDEPENDENT_AMBULATORY_CARE_PROVIDER_SITE_OTHER): Payer: Medicare Other | Admitting: Podiatrist

## 2021-11-24 DIAGNOSIS — B351 Tinea unguium: Secondary | ICD-10-CM

## 2021-11-24 DIAGNOSIS — Z860100 Personal history of colon polyps, unspecified: Secondary | ICD-10-CM | POA: Insufficient documentation

## 2021-11-24 DIAGNOSIS — M79676 Pain in unspecified toe(s): Secondary | ICD-10-CM | POA: Diagnosis not present

## 2021-11-24 DIAGNOSIS — L509 Urticaria, unspecified: Secondary | ICD-10-CM | POA: Insufficient documentation

## 2021-11-24 DIAGNOSIS — K648 Other hemorrhoids: Secondary | ICD-10-CM | POA: Insufficient documentation

## 2021-11-24 DIAGNOSIS — E1151 Type 2 diabetes mellitus with diabetic peripheral angiopathy without gangrene: Secondary | ICD-10-CM

## 2021-11-24 DIAGNOSIS — R1013 Epigastric pain: Secondary | ICD-10-CM | POA: Insufficient documentation

## 2021-11-24 DIAGNOSIS — R194 Change in bowel habit: Secondary | ICD-10-CM | POA: Insufficient documentation

## 2021-11-24 NOTE — Progress Notes (Signed)
Subjective: ?[name] is a 76 y.o. female patient with history of diabetes who presents to office today complaining of long,mildly painful nails  while ambulating in shoes; unable to trim.  Patient denies any new cramping, numbness, burning or tingling in the legs. ?.she saw Dr. Chapman Fitch last week and relates her most recent HgA1c was 6.3 ? ?Patient Active Problem List  ? Diagnosis Date Noted  ? Change in bowel habit 11/24/2021  ? Epigastric pain 11/24/2021  ? Internal hemorrhoids 11/24/2021  ? Personal history of colonic polyps 11/24/2021  ? Urticaria 11/24/2021  ? Spinal stenosis of cervical region 11/05/2021  ? Pincer nail deformity 09/05/2021  ? History of diabetes mellitus 03/20/2021  ? Iron deficiency 03/20/2021  ? Pruritus 01/09/2021  ? Pain in left leg 12/23/2020  ? Prediabetes 11/18/2020  ? Incarcerated incisional hernia s/p lap repair w mesh 06/06/2020 06/06/2020  ? 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 Polyp of colon 10/23/2019  ? Chronic respiratory failure with hypoxia ([place]) 04/01/2019  ? Medication management 02/28/2019  ? Combined forms of age-related cataract of both eyes 12/01/2018  ? Early stage nonexudative age-related macular degeneration of both eyes 12/01/2018  ? 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  ? PVD (posterior vitreous detachment), left 11/28/2018  ? Dysphagia 12/30/2017  ? Asthma 04/12/2017  ? BMI 40.0-44.9, adult ([place]) 04/12/2017  ? Irritable bowel syndrome 04/12/2017  ? Osteopenia 04/12/2017  ? Umbilical hernia without obstruction and without gangrene 03/18/2017  ? Hiatal hernia 01/26/2017  ? Vitamin D deficiency 06/16/2016  ? Encounter for preoperative examination for general surgical procedure 03/06/2016  ? Hypercholesterolemia 02/25/2016  ? Hypertensive disorder 02/25/2016  ? Cough 08/02/2015  ? Obstructive sleep apnea 08/06/2013  ? Neoplasm of breast 07/20/2013  ? COPD with acute bronchitis ([place]) 06/18/2013  ? Myalgia 06/18/2013  ? History of laparoscopic  adjustable gastric banding, APS. 09/04/2008. 04/08/2011  ? History of lung cancer 06/27/2010  ? SKIN RASH 12/13/2009  ? DYSPNEA 07/05/2008  ? DIABETES, TYPE 2 07/01/2008  ? Obesity, morbid ([place]) 07/01/2008  ? Type 2 diabetes mellitus without complications ([place]) 37/90/2409  ? COPD mixed type ([place]) 06/26/2008  ? Malignant neoplasm of lung ([place]) 07/20/2001  ? ?Current Outpatient Medications on File Prior to Visit  ?Medication Sig Dispense Refill  ? acetaminophen (TYLENOL) 650 MG CR tablet Take 1,300 mg by mouth every 8 (eight) hours as needed for pain.    ? albuterol (PROVENTIL) (2.5 MG/3ML) 0.083% nebulizer solution USE 1 VIAL IN NEBULIZER EVERY 8 HOURS AS NEEDED FOR WHEEZING OR SHORTNESS OF BREATH 375 mL 11  ? albuterol (VENTOLIN HFA) 108 (90 Base) MCG/ACT inhaler TAKE 2 PUFFS BY MOUTH EVERY 6 HOURS AS NEEDED FOR WHEEZE OR SHORTNESS OF BREATH 18 each 12  ? aspirin EC 81 MG tablet Take 1 tablet (81 mg total) by mouth daily. Swallow whole. 30 tablet 11  ? Biotin 5000 MCG TABS Take 5,000 mcg by mouth daily.     ? Budeson-Glycopyrrol-Formoterol (BREZTRI AEROSPHERE) 160-9-4.8 MCG/ACT AERO Inhale 2 puffs into the lungs 2 (two) times daily. 10.7 g 12  ? Calcium Carb-Cholecalciferol 600-800 MG-UNIT TABS calcium carbonate 600 mg-vitamin D3 20 mcg (800 unit) tablet ? TAKE 1 TABLET BY MOUTH TWICE A DAY    ? cetirizine (ZYRTEC) 10 MG tablet Take 10 mg by mouth daily.    ? chlorpheniramine-HYDROcodone (TUSSIONEX PENNKINETIC ER) 10-8 MG/5ML SUER 5 ml twice daily if needed for cough 200 mL 0  ? diphenhydrAMINE (BENADRYL) 25 mg capsule  Take 50 mg by mouth daily.    ? FREESTYLE INSULINX TEST test strip AS DIRECTED TO OBTAIN BLOOD TWICE A DAY AS NEEDED DX CODE E11.69 90 DAYS    ? glucose 4 GM chewable tablet Chew 20-24 g by mouth as needed for low blood sugar.    ? ketoconazole (NIZORAL) 2 % cream Apply 1 application topically daily. (Patient taking differently: Apply 1 application. topically daily as needed.) 60 g 2  ? Lancets  (FREESTYLE) lancets     ? Magnesium 400 MG TABS Take 400 mg by mouth 2 (two) times daily.    ? Melatonin 10 MG CAPS Take 10 mg by mouth at bedtime as needed (sleep).    ? Multiple Vitamin (MULTIVITAMIN) tablet Take 1 tablet by mouth daily.    ? Nebulizers (COMPRESSOR/NEBULIZER) MISC Use as directed 1 each prn  ? nitroGLYCERIN (NITROSTAT) 0.4 MG SL tablet Place 1 tablet (0.4 mg total) under the tongue every 5 (five) minutes as needed for chest pain. If you require more than two tablets five minutes apart go to the nearest ER via EMS. 30 tablet 0  ? olmesartan-hydrochlorothiazide (BENICAR HCT) 20-12.5 MG tablet Take 1 tablet by mouth daily. 30 tablet 0  ? Polyethyl Glycol-Propyl Glycol (SYSTANE OP) Place 1 drop into both eyes daily as needed (dry eyes).    ? rosuvastatin (CRESTOR) 10 MG tablet Take 1 tablet (10 mg total) by mouth at bedtime. 90 tablet 0  ? ?Current Facility-Administered Medications on File Prior to Visit  ?Medication Dose Route Frequency Provider Last Rate Last Admin  ? methylPREDNISolone acetate (DEPO-MEDROL) injection 80 mg  80 mg Intramuscular Once Deneise Lever, MD      ? ?No Known Allergies ? ? ? ?Objective: ?General: Patient is awake, alert, and oriented x 3 and in no acute distress. ? ?Integument: Skin is warm, dry and supple bilateral. Nails are tender, long, thickened and  ?dystrophic with subungual debris with pincer nails noted, consistent with onychomycosis, 1-5 bilateral. No signs of infection. No open lesions or preulcerative lesions present bilateral. Remaining integument unremarkable. ? ?Vasculature:  Dorsalis Pedis pulse 1/4 bilateral. Posterior Tibial pulse  1/4 bilateral.  ?Capillary fill time <3 sec 1-5 bilateral. Positive hair growth to the level of the digits. ?Temperature gradient within normal limits. multiple varicosities present bilateral. + edema present bilateral.  ? ?Neurology: The patient has intact sensation measured with a 5.07/10g Semmes Weinstein Monofilament at  all pedal sites bilateral . Vibratory sensation diminished bilateral with tuning fork. No Babinski sign present bilateral.  ? ?Musculoskeletal: No symptomatic pedal deformities noted bilateral. Muscular strength 5/5 in all lower extremity muscular groups bilateral without pain on range of motion . No tenderness with calf compression bilateral. ? ?Assessment and Plan: ?  ICD-10-CM   ?1. Type 2 diabetes mellitus with peripheral vascular disease (HCC)  E11.51   ?  ?2. Pain due to onychomycosis of toenail  B35.1   ? H41.740   ?  ? ? ? ?-Examined patient. ?-Discussed and educated patient on diabetic foot care, especially with  ?regards to the vascular, neurological and musculoskeletal systems.  ?-Stressed the importance of good glycemic control and the detriment of not  ?controlling glucose levels in relation to the foot. ?-Mechanically debrided all nails 1-5 bilateral using sterile nail nipper and filed with dremel without incident  ?-Answered all patient questions ?-Patient to return  in 3 months for at risk foot care ?-Patient advised to call the office if any problems or questions arise  in the meantime. ? ?Bronson Ing, DPM ?

## 2021-11-27 NOTE — Progress Notes (Signed)
Called patient, [name], LMAM

## 2021-11-28 NOTE — Progress Notes (Signed)
Called and spoke with patient regarding her lab results. Patient wanted you to know that she has not been taking her BP medication because her numbers have been low.

## 2021-12-03 ENCOUNTER — Ambulatory Visit: Payer: Medicare Other | Admitting: Podiatry

## 2021-12-03 ENCOUNTER — Other Ambulatory Visit: Payer: Medicare Other

## 2021-12-04 ENCOUNTER — Ambulatory Visit: Payer: Medicare Other | Admitting: Internal Medicine

## 2021-12-04 ENCOUNTER — Other Ambulatory Visit: Payer: Self-pay | Admitting: Cardiology

## 2021-12-04 DIAGNOSIS — I10 Essential (primary) hypertension: Secondary | ICD-10-CM

## 2021-12-08 ENCOUNTER — Ambulatory Visit: Payer: Medicare Other

## 2021-12-08 DIAGNOSIS — R9431 Abnormal electrocardiogram [ECG] [EKG]: Secondary | ICD-10-CM

## 2021-12-08 DIAGNOSIS — R072 Precordial pain: Secondary | ICD-10-CM

## 2021-12-10 ENCOUNTER — Ambulatory Visit
Admission: RE | Admit: 2021-12-10 | Discharge: 2021-12-10 | Disposition: A | Discharge status: Home / Self Care | Payer: No Typology Code available for payment source | Source: Ambulatory Visit | Attending: Cardiology | Admitting: Cardiology

## 2021-12-10 DIAGNOSIS — R072 Precordial pain: Secondary | ICD-10-CM

## 2021-12-10 DIAGNOSIS — E782 Mixed hyperlipidemia: Secondary | ICD-10-CM

## 2021-12-12 ENCOUNTER — Encounter: Payer: Self-pay | Admitting: Cardiology

## 2021-12-12 ENCOUNTER — Ambulatory Visit: Payer: Medicare Other | Admitting: Cardiology

## 2021-12-12 VITALS — BP 115/80 | HR 68 | Temp 97.6°F | Resp 16 | Ht 60.0 in | Wt 220.2 lb

## 2021-12-12 DIAGNOSIS — E782 Mixed hyperlipidemia: Secondary | ICD-10-CM

## 2021-12-12 DIAGNOSIS — Z85118 Personal history of other malignant neoplasm of bronchus and lung: Secondary | ICD-10-CM

## 2021-12-12 DIAGNOSIS — I10 Essential (primary) hypertension: Secondary | ICD-10-CM

## 2021-12-12 DIAGNOSIS — R072 Precordial pain: Secondary | ICD-10-CM

## 2021-12-12 NOTE — Progress Notes (Signed)
ID:  [name], DOB [birth year], MRN [id number]  PCP:  Antony Blackbird, MD  Cardiologist:  Rex Kras, DO, [facility] (established care 11/11/2021) Former Cardiology Providers: Dr. Adrian Prows  Date: 12/12/21 Last Office Visit: 11/11/2021  Chief Complaint  Patient presents with   Chest Pain   Results   Follow-up    HPI  [name] is a 76 y.o. African-American female whose past medical history and cardiovascular risk factors include: Prediabetes, hypertension, hyperlipidemia, obesity due to excess calories, history of gastric banding/gastric bypass surgery  Patient presents today for reevaluation of precordial pain and to discuss test results.  He is accompanied by her daughter [name] also provides collateral history as part of today's encounter.  At the last office visit she was recommended to undergo a nuclear stress test as she is unable to exercise due to her precordial chest pain and EKG noting TWI in inferolateral and anterior leads.  Stress test was reported to be low risk.  Given her hyperlipidemia with LDL level of 140 mg/dL and estimated 10-year risk of ASCVD greater than 20% she was recommended to start Crestor 10 mg p.o. daily at the last office visit and to consider coronary artery calcium score for further risk stratification.  Patient's total coronary calcium score is 0.  And repeat fasting lipid profile still pending.  Given her elevated high blood pressures she was started on Benicar/HCTZ and repeat labs from 11/21/2021 notes stable renal function and electrolytes.  Her blood pressure today is very well controlled.  FUNCTIONAL STATUS: No structured exercise program or daily routine.  ALLERGIES: No Known Allergies  MEDICATION LIST PRIOR TO VISIT: Current Meds  Medication Sig   acetaminophen (TYLENOL) 650 MG CR tablet Take 1,300 mg by mouth every 8 (eight) hours as needed for pain.   albuterol (PROVENTIL) (2.5 MG/3ML) 0.083% nebulizer solution USE 1 VIAL  IN NEBULIZER EVERY 8 HOURS AS NEEDED FOR WHEEZING OR SHORTNESS OF BREATH   albuterol (VENTOLIN HFA) 108 (90 Base) MCG/ACT inhaler TAKE 2 PUFFS BY MOUTH EVERY 6 HOURS AS NEEDED FOR WHEEZE OR SHORTNESS OF BREATH   aspirin EC 81 MG tablet Take 1 tablet (81 mg total) by mouth daily. Swallow whole.   Biotin 5000 MCG CAPS 1 capsule   Budeson-Glycopyrrol-Formoterol (BREZTRI AEROSPHERE) 160-9-4.8 MCG/ACT AERO Inhale 2 puffs into the lungs 2 (two) times daily.   Calcium Carb-Cholecalciferol 600-800 MG-UNIT TABS calcium carbonate 600 mg-vitamin D3 20 mcg (800 unit) tablet  TAKE 1 TABLET BY MOUTH TWICE A DAY   cetirizine (ZYRTEC) 10 MG tablet Take 10 mg by mouth daily.   chlorpheniramine-HYDROcodone (TUSSIONEX PENNKINETIC ER) 10-8 MG/5ML SUER 5 ml twice daily if needed for cough   Continuous Blood Gluc Receiver ([place]) DEVI See admin instructions.   Continuous Blood Gluc Sensor (FREESTYLE LIBRE 14 DAY SENSOR) MISC See admin instructions.   Continuous Blood Gluc Transmit (DEXCOM G6 TRANSMITTER) MISC See admin instructions.   diphenhydrAMINE (BENADRYL) 25 MG tablet 1 tablet at bedtime as needed   FREESTYLE INSULINX TEST test strip AS DIRECTED TO OBTAIN BLOOD TWICE A DAY AS NEEDED DX CODE E11.69 90 DAYS   glucose 4 GM chewable tablet Chew 20-24 g by mouth as needed for low blood sugar.   Glycopyrrolate-Formoterol (BEVESPI AEROSPHERE) 9-4.8 MCG/ACT AERO 2 puffs   ketoconazole (NIZORAL) 2 % cream Apply 1 application topically daily. (Patient taking differently: Apply 1 application. topically daily as needed.)   Lancets (FREESTYLE) lancets    Magnesium 400 MG TABS Take  400 mg by mouth 2 (two) times daily.   Melatonin 10 MG CAPS Take 10 mg by mouth at bedtime as needed (sleep).   Multiple Vitamin (MULTIVITAMIN) tablet Take 1 tablet by mouth daily.   Nebulizers (COMPRESSOR/NEBULIZER) MISC Use as directed   nitroGLYCERIN (NITROSTAT) 0.4 MG SL tablet Place 1 tablet (0.4 mg total) under the tongue every  5 (five) minutes as needed for chest pain. If you require more than two tablets five minutes apart go to the nearest ER via EMS.   olmesartan-hydrochlorothiazide (BENICAR HCT) 20-12.5 MG tablet TAKE 1 TABLET BY MOUTH EVERY DAY   Polyethyl Glycol-Propyl Glycol (SYSTANE OP) Place 1 drop into both eyes daily as needed (dry eyes).   rosuvastatin (CRESTOR) 10 MG tablet Take 1 tablet (10 mg total) by mouth at bedtime.   triamcinolone cream (KENALOG) 0.1 % 1 application   Current Facility-Administered Medications for the 12/12/21 encounter (Office Visit) with Rex Kras, DO  Medication   methylPREDNISolone acetate (DEPO-MEDROL) injection 80 mg     PAST MEDICAL HISTORY: Past Medical History:  Diagnosis Date   Abdominal pain    around naval   Arthritis    Breast pain in female    Bruises easily    Cancer ([place]) 2003   lung   Cervical disc disease    COPD (chronic obstructive pulmonary disease) ([place])    O2 as needed.   DJD (degenerative joint disease)    Dyspnea    Gum disease    Hernia    Hypoglycemic episode in patient with diabetes mellitus ([place]) 2021   Diet controlled diabetes with occasional hypoglycemia at night   Hypothyroidism    no meds   Insomnia    On home O2    as needed   Phlebitis    Pre-diabetes    Thyroid disease    Wears glasses    Weight increase     PAST SURGICAL HISTORY: Past Surgical History:  Procedure Laterality Date   ABDOMINAL HYSTERECTOMY     partial   FOOT SURGERY     right   LAPAROSCOPIC GASTRIC BANDING  Feb 2010   LAPAROSCOPIC ROUX-EN-Y GASTRIC BYPASS WITH UPPER ENDOSCOPY AND REMOVAL OF LAP BAND  2018   LEFT HEART CATHETERIZATION WITH CORONARY ANGIOGRAM N/A 05/02/2012   Procedure: LEFT HEART CATHETERIZATION WITH CORONARY ANGIOGRAM;  Surgeon: Laverda Page, MD;  Location: [facility] CATH LAB;  Service: Cardiovascular;  Laterality: N/A;   LUNG LOBECTOMY  2003   left   MASS EXCISION Right 06/06/2020   Procedure: REMOVAL MASS ON RIGHT BUTTOCK;   Surgeon: Michael Boston, MD;  Location: [facility] ORS;  Service: General;  Laterality: Right;   SPINE SURGERY     fusion on the neck   THYROIDECTOMY     partial   TONSILLECTOMY     VENTRAL HERNIA REPAIR N/A 06/06/2020   Procedure: LAPAROSCOPIC INCARCERATED INCISIONAL HERNIA REPAIR WITH MESH AND TAP BLOCK;  Surgeon: Michael Boston, MD;  Location: [facility] ORS;  Service: General;  Laterality: N/A;    FAMILY HISTORY: The patient family history includes Stroke in her father and mother.  SOCIAL HISTORY:  The patient  reports that she quit smoking about 19 years ago. Her smoking use included cigarettes. She has a 74.00 pack-year smoking history. She has never used smokeless tobacco. She reports current alcohol use. She reports that she does not use drugs.  REVIEW OF SYSTEMS: Review of Systems  Cardiovascular:  Positive for dyspnea on exertion (chronic and stable). Negative for chest pain, leg  swelling, palpitations and syncope.       Leg pain -not consistent with claudication  Respiratory:  Positive for shortness of breath (Chronic and stable).   Musculoskeletal:  Positive for arthritis, back pain and joint pain.   PHYSICAL EXAM:    12/12/2021    9:48 AM 11/11/2021   12:56 PM 11/11/2021   12:55 PM  Vitals with BMI  Height 5' 0"    Weight 220 lbs 3 oz    BMI 43    Systolic [phone]  Diastolic 80 77 93  Pulse 68 64     CONSTITUTIONAL: Well-developed and well-nourished. No acute distress.  Walks with a walker and supplemental oxygen SKIN: Skin is warm and dry. No rash noted. No cyanosis. No pallor. No jaundice HEAD: Normocephalic and atraumatic.  EYES: No scleral icterus MOUTH/THROAT: Moist oral membranes.  NECK: No JVD present. No thyromegaly noted. No carotid bruits  LYMPHATIC: No visible cervical adenopathy.  CHEST Normal respiratory effort. No intercostal retractions  LUNGS: Clear to auscultation bilaterally.  No stridor. No wheezes. No rales.  CARDIOVASCULAR: Regular rate and rhythm,  positive S1-S2, no murmurs rubs or gallops appreciated. ABDOMINAL: Obese, soft, nontender, nondistended, positive bowel sounds all 4 quadrants no apparent ascites.  EXTREMITIES: + Bilateral peripheral edema, warm to touch, 1+ bilateral DP and PT pulses HEMATOLOGIC: No significant bruising NEUROLOGIC: Oriented to person, place, and time. Nonfocal. Normal muscle tone.  PSYCHIATRIC: Normal mood and affect. Normal behavior. Cooperative  CARDIAC DATABASE: EKG: 04/29/2012: Normal sinus rhythm, 62 bpm, left axis, ST-T changes inferolateral and anterior leads concerning for possible ischemia.  October 29, 2021 provided by referring physician: Sinus bradycardia, 56 bpm, left axis, left anterior fascicular block, T wave inversions in inferolateral leads suggestive of possible ischemia.  November 11, 2021: Normal sinus rhythm, 62 bpm, left axis, left anterior fascicular block, consider old anteroseptal infarct, TWI consider anterior and inferolateral ischemia.  Similar findings on prior ECG.  Echocardiogram: 10/29/2021 [facility]: Per report LVEF 55 to 60%, mild LVH, aortic atherosclerosis, normal left atrial size,  Stress Testing: Lexiscan Tetrofosmin stress test 12/08/2021: Lexiscan nuclear stress test performed using 1-day protocol. Normal myocardial perfusion. Stress LVEF 55%. Low risk study.  Heart Catheterization: 05/02/2012: Right coronary artery: The vessel is smooth, normal, Very small with a very tiny PD branch. It is Co- Dominant. Normal   Left main coronary artery is large and normal.   Circumflex coronary artery: A large vessel giving origin to a large obtuse marginal 1. It is dominant. Normal   LAD:  LAD gives origin to a large diagonal-1. LAD Ends before reaching the apex. Normal   [place] intermediate: It is a very large branch. It is smooth and normal.  LV gram: 55-60%  Lower extremity arterial duplex: 02/25/2018. No hemodynamically significant stenosis  within the bilateral lower extremity.  Right ABI 0.93, mildly decreased perfusion.  Left ABI 1.04, normal perfusion.  Coronary calcium score:  12/10/2021  No visible coronary artery calcifications. Total coronary calcium score of 0. No acute extra cardiac abnormality.  LABORATORY DATA: Hemoglobin A1c   2020-03-20    eAG 143      Hgb A1c 6.6   4.8-5.6  BNP   2020-03-20    BNP 50   2-751   Comp Metabolic Panel   [id number]    Albumin 4.1   3.4-4.8  ALP 76   38-126  ALT 12   0-52  Anion Gap 10.4   6.0-20.0  AST 19  0-39  BUN 25   6-26  CO2 31   22-32  CA-corrected 9.61   8.60-10.30  Calcium 9.7   8.6-10.3  Chloride 104   98-107  Creatinine 1.05   0.60-1.30  AAeGFR 62   >60  eGFR 51   >60  Glucose 100   70-99  Potassium 4.6   3.5-5.5  Sodium 141   136-145   External Labs: Collected: 11/14/2020. Total cholesterol 204, triglycerides 93, HDL 59, LDL 128. Hemoglobin A1c 6.4%  Collected: October 29, 2021 provided by the patient. Hemoglobin 15.1 g/dL, hematocrit 48.1%. BUN 20, creatinine 1.15. eGFR 49. Sodium 145, potassium 4.8, chloride 105, bicarb 25, AST 27, ALT 16. Total cholesterol 224, triglycerides 114, HDL 69, LDL 140. A1c 6.3   IMPRESSION:    ICD-10-CM   1. Precordial pain  R07.2     2. Mixed hyperlipidemia  E78.2 Hepatic function panel    Lipid Panel With LDL/HDL Ratio    LDL cholesterol, direct    3. Benign hypertension  I10     4. Hx of cancer of lung  Z85.118        RECOMMENDATIONS: Jaylani A Randleman is a 76 y.o. African-American female whose past medical history and cardiac risk factors include: Prediabetes, hypertension, hyperlipidemia, obesity due to excess calories, history of gastric banding/gastric bypass surgery, hx of lung cancer.   Precordial pain Resolved. Independently reviewed the results of the stress test & coronary calcium score with the patient and her daughter at today's office visit. Education the importance of improving her  modifiable cardiovascular risk factors No additional testing warranted at this time.  Mixed hyperlipidemia Currently on rosuvastatin.   She denies myalgia or other side effects. We will order follow-up lipid profile, direct LDL, and hepatic function test. As long as the labs remain stable we will continue current medical therapy.  Benign hypertension Started on Benicar/HCTZ at last office visit. Repeat labs from Nov 21, 2021 independently reviewed which notes stable renal function and electrolytes. Office blood pressures are now very well controlled. Monitor for now  Hx of cancer of lung She is a lung cancer survivor, no history of COPD, uses oxygen with activity. Recommend follow-up with pulmonary medicine  FINAL MEDICATION LIST END OF ENCOUNTER: No orders of the defined types were placed in this encounter.   Medications Discontinued During This Encounter  Medication Reason   ALPRAZolam (XANAX) 0.25 MG tablet    Biotin 6546 MCG TABS Duplicate   diphenhydrAMINE (BENADRYL) 25 mg capsule Duplicate   ibuprofen (MOTRIN IB) 200 MG tablet    Multiple Vitamins-Minerals (MULTI COMPLETE) CAPS Duplicate     Current Outpatient Medications:    acetaminophen (TYLENOL) 650 MG CR tablet, Take 1,300 mg by mouth every 8 (eight) hours as needed for pain., Disp: , Rfl:    albuterol (PROVENTIL) (2.5 MG/3ML) 0.083% nebulizer solution, USE 1 VIAL IN NEBULIZER EVERY 8 HOURS AS NEEDED FOR WHEEZING OR SHORTNESS OF BREATH, Disp: 375 mL, Rfl: 11   albuterol (VENTOLIN HFA) 108 (90 Base) MCG/ACT inhaler, TAKE 2 PUFFS BY MOUTH EVERY 6 HOURS AS NEEDED FOR WHEEZE OR SHORTNESS OF BREATH, Disp: 18 each, Rfl: 12   aspirin EC 81 MG tablet, Take 1 tablet (81 mg total) by mouth daily. Swallow whole., Disp: 30 tablet, Rfl: 11   Biotin 5000 MCG CAPS, 1 capsule, Disp: , Rfl:    Budeson-Glycopyrrol-Formoterol (BREZTRI AEROSPHERE) 160-9-4.8 MCG/ACT AERO, Inhale 2 puffs into the lungs 2 (two) times daily., Disp: 10.7 g,  Rfl: 12  Calcium Carb-Cholecalciferol 600-800 MG-UNIT TABS, calcium carbonate 600 mg-vitamin D3 20 mcg (800 unit) tablet  TAKE 1 TABLET BY MOUTH TWICE A DAY, Disp: , Rfl:    cetirizine (ZYRTEC) 10 MG tablet, Take 10 mg by mouth daily., Disp: , Rfl:    chlorpheniramine-HYDROcodone (TUSSIONEX PENNKINETIC ER) 10-8 MG/5ML SUER, 5 ml twice daily if needed for cough, Disp: 200 mL, Rfl: 0   Continuous Blood Gluc Receiver ([place]) DEVI, See admin instructions., Disp: , Rfl:    Continuous Blood Gluc Sensor (FREESTYLE LIBRE 14 DAY SENSOR) MISC, See admin instructions., Disp: , Rfl:    Continuous Blood Gluc Transmit (DEXCOM G6 TRANSMITTER) MISC, See admin instructions., Disp: , Rfl:    diphenhydrAMINE (BENADRYL) 25 MG tablet, 1 tablet at bedtime as needed, Disp: , Rfl:    FREESTYLE INSULINX TEST test strip, AS DIRECTED TO OBTAIN BLOOD TWICE A DAY AS NEEDED DX CODE E11.69 90 DAYS, Disp: , Rfl:    glucose 4 GM chewable tablet, Chew 20-24 g by mouth as needed for low blood sugar., Disp: , Rfl:    Glycopyrrolate-Formoterol (BEVESPI AEROSPHERE) 9-4.8 MCG/ACT AERO, 2 puffs, Disp: , Rfl:    ketoconazole (NIZORAL) 2 % cream, Apply 1 application topically daily. (Patient taking differently: Apply 1 application. topically daily as needed.), Disp: 60 g, Rfl: 2   Lancets (FREESTYLE) lancets, , Disp: , Rfl:    Magnesium 400 MG TABS, Take 400 mg by mouth 2 (two) times daily., Disp: , Rfl:    Melatonin 10 MG CAPS, Take 10 mg by mouth at bedtime as needed (sleep)., Disp: , Rfl:    Multiple Vitamin (MULTIVITAMIN) tablet, Take 1 tablet by mouth daily., Disp: , Rfl:    Nebulizers (COMPRESSOR/NEBULIZER) MISC, Use as directed, Disp: 1 each, Rfl: prn   nitroGLYCERIN (NITROSTAT) 0.4 MG SL tablet, Place 1 tablet (0.4 mg total) under the tongue every 5 (five) minutes as needed for chest pain. If you require more than two tablets five minutes apart go to the nearest ER via EMS., Disp: 30 tablet, Rfl: 0    olmesartan-hydrochlorothiazide (BENICAR HCT) 20-12.5 MG tablet, TAKE 1 TABLET BY MOUTH EVERY DAY, Disp: 30 tablet, Rfl: 0   Polyethyl Glycol-Propyl Glycol (SYSTANE OP), Place 1 drop into both eyes daily as needed (dry eyes)., Disp: , Rfl:    rosuvastatin (CRESTOR) 10 MG tablet, Take 1 tablet (10 mg total) by mouth at bedtime., Disp: 90 tablet, Rfl: 0   triamcinolone cream (KENALOG) 0.1 %, 1 application, Disp: , Rfl:   Current Facility-Administered Medications:    methylPREDNISolone acetate (DEPO-MEDROL) injection 80 mg, 80 mg, Intramuscular, Once, Young, Clinton D, MD  Orders Placed This Encounter  Procedures   Hepatic function panel   Lipid Panel With LDL/HDL Ratio   LDL cholesterol, direct    There are no Patient Instructions on file for this visit.   --Continue cardiac medications as reconciled in final medication list. --Return in about 1 year (around 12/13/2022) for Annual follow up . Or sooner if needed. --Continue follow-up with your primary care physician regarding the management of your other chronic comorbid conditions.  Patient's questions and concerns were addressed to her satisfaction. She voices understanding of the instructions provided during this encounter.   This note was created using a voice recognition software as a result there may be grammatical errors inadvertently enclosed that do not reflect the nature of this encounter. Every attempt is made to correct such errors.  Rex Kras, [place], [facility]  Pager: [phone] Office: [phone]

## 2021-12-13 LAB — LIPID PANEL WITH LDL/HDL RATIO
Cholesterol, Total: 151 mg/dL (ref 100–199)
HDL: 59 mg/dL (ref >39)
LDL Chol Calc (NIH): 75 mg/dL (ref 0–99)
LDL/HDL Ratio: 1.3 ratio (ref 0.0–3.2)
Triglycerides: 92 mg/dL (ref 0–149)
VLDL Cholesterol Cal: 17 mg/dL (ref 5–40)

## 2021-12-13 LAB — HEPATIC FUNCTION PANEL
ALT: 17 IU/L (ref 0–32)
AST: 20 IU/L (ref 0–40)
Albumin: 4.6 g/dL (ref 3.7–4.7)
Alkaline Phosphatase: 79 IU/L (ref 44–121)
Bilirubin Total: 0.4 mg/dL (ref 0.0–1.2)
Bilirubin, Direct: 0.14 mg/dL (ref 0.00–0.40)
Total Protein: 6.7 g/dL (ref 6.0–8.5)

## 2021-12-13 LAB — LDL CHOLESTEROL, DIRECT: LDL Direct: 75 mg/dL (ref 0–99)

## 2021-12-23 NOTE — Progress Notes (Signed)
Called pt no answer, left a vm

## 2021-12-25 ENCOUNTER — Other Ambulatory Visit: Payer: Self-pay | Admitting: Internal Medicine

## 2022-01-03 NOTE — Progress Notes (Deleted)
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 FEV1 1.75/87%, FEV1/FVC 0.53, DLCO 49 Office Spirometry 07/04/2015-mild airway obstruction. FEV1/FVC 0.55 Office Spirometry 03/06/2016- mild obstructive airways disease-FVC 2.01/104%, FEV1 1.16/78%, ratio 0.58, FEF 25-75% 0.61/ 43% NPSG 07/03/13- AHI 5.3/ hr, weight 234 pounds PFT 05/03/17-mild obstructive airways disease, no response to bronchodilator.  Diffusion moderately reduced.  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 She would like to change her oxygen supplier. Wheezing and dyspnea stable/controlled with current bronchodilators. CXR 12/06/20-  IMPRESSION: Chronic lung changes with redemonstration of surgical changes at the left hilum and no evidence of acute cardiopulmonary disease.  01/05/22- 50  female former smoker followed for COPD, Chronic Hypoxic Resp Failure, history left lower lobe resection/chemotherapy 0240 NSCCa, complicated by DM 2, obesity / lap band surgery, Covid infection Jan 2022,  O2 2L/ [employer]   -Neb albuterol, Breztri , Ventolin hfa, prednisone 2.5 mg daily maint for  renal function,     Covid vax-5 [employer] Flu vax    ROS-see HPI + = positive Constitutional:   No-   weight loss, night sweats, fevers, chills,+ fatigue, lassitude. HEENT:   No-  headaches, +difficulty swallowing, tooth/dental problems, sore throat,       No-  sneezing, itching, ear ache, +nasal congestion, post nasal drip,  CV:  No-   chest pain, orthopnea, PND, swelling in lower extremities, anasarca, dizziness, palpitations Resp: +  shortness of breath with exertion or at rest.        productive cough,  non-productive cough,   coughing up of blood.              No-   change in color of mucus. wheezing.   Skin: No-   rash or lesions. GI:  No-   heartburn, indigestion, abdominal pain, nausea, vomiting, GU: . MS:  No-   joint pain or swelling.   cramping myalgias Neuro-     nothing unusual Psych:  No- change in mood or affect. No depression or anxiety.  No memory loss.  OBJ General- Alert, Oriented, Affect-appropriate, Distress- none acute, +obese Skin- rash-none, lesions- none, excoriation- none.  Lymphadenopathy- none Head- atraumatic            Eyes- Gross vision intact, PERRLA, conjunctivae clear secretions            Ears- Hearing, canals-normal            Nose- Clear, no-Septal dev, mucus, polyps, erosion, perforation             Throat- Mallampati III-IV , mucosa , drainage- none, tonsils- atrophic Neck- flexible , trachea midline, no stridor , thyroid nl, carotid no bruit Chest - symmetrical excursion , unlabored           Heart/CV- RRR , no murmur , no gallop  , no rub, nl  s1 s2                           - JVD- none , edema- none, stasis changes- none, varices- none           Lung- clear, wheeze- none, cough-none , dullness-none, rub- none           Chest wall- scar left chest Port-A-Cath Abd-  Br/ Gen/ Rectal- Not done, not indicated Extrem- cyanosis- none, clubbing, none, atrophy- none, strength- nl Neuro- grossly intact to observation

## 2022-01-05 ENCOUNTER — Ambulatory Visit: Payer: Medicare Other | Admitting: Internal Medicine

## 2022-01-13 ENCOUNTER — Encounter: Payer: Self-pay | Admitting: Cardiology

## 2022-01-21 ENCOUNTER — Telehealth: Payer: Self-pay | Admitting: Internal Medicine

## 2022-01-21 NOTE — Telephone Encounter (Signed)
Good Morning Dr. Lorenso Courier,  D.O.D for 7/3 PM   Patient called stating that she has a referral in for RUQ pain, alternating constipation and diarrhea. Patient was last seen by [facility] in November of 2022.Patient stated she wanted to come to our office because she wanted a change in GI providers. Patients records are in [place], will please review and advise on scheduling?  Thank you.

## 2022-01-26 ENCOUNTER — Encounter: Payer: Self-pay | Admitting: Internal Medicine

## 2022-02-04 ENCOUNTER — Ambulatory Visit: Payer: Medicare Other | Admitting: Podiatry
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
## 2022-02-05 ENCOUNTER — Ambulatory Visit (INDEPENDENT_AMBULATORY_CARE_PROVIDER_SITE_OTHER): Payer: Medicare Other | Admitting: Internal Medicine

## 2022-02-05 ENCOUNTER — Ambulatory Visit (INDEPENDENT_AMBULATORY_CARE_PROVIDER_SITE_OTHER): Payer: Medicare Other

## 2022-02-05 ENCOUNTER — Encounter: Payer: Self-pay | Admitting: Internal Medicine

## 2022-02-05 VITALS — BP 102/62 | HR 70 | Ht 60.0 in | Wt 237.7 lb

## 2022-02-05 DIAGNOSIS — J449 Chronic obstructive pulmonary disease, unspecified: Secondary | ICD-10-CM

## 2022-02-05 DIAGNOSIS — J9611 Chronic respiratory failure with hypoxia: Secondary | ICD-10-CM | POA: Diagnosis not present

## 2022-02-05 MED ORDER — HYDROCOD POLI-CHLORPHE POLI ER 10-8 MG/5ML PO SUER: 5.0000 mL | Freq: Two times a day (BID) | ORAL | 0 refills | Status: DC | PRN

## 2022-02-05 NOTE — Patient Instructions (Addendum)
Tussionex refilled  Be sure to take up the choke/swallow problem with your new GI doctor  Order- CXR    dx COPD mixed type

## 2022-02-17 ENCOUNTER — Ambulatory Visit (INDEPENDENT_AMBULATORY_CARE_PROVIDER_SITE_OTHER): Payer: Medicare Other

## 2022-02-17 ENCOUNTER — Encounter: Payer: Self-pay | Admitting: Orthopaedic Surgery

## 2022-02-17 ENCOUNTER — Ambulatory Visit: Payer: Medicare Other | Admitting: Orthopaedic Surgery

## 2022-02-17 VITALS — BP 116/71 | HR 62 | Ht 60.0 in | Wt 237.0 lb

## 2022-02-17 DIAGNOSIS — M4802 Spinal stenosis, cervical region: Secondary | ICD-10-CM | POA: Diagnosis not present

## 2022-02-17 DIAGNOSIS — M5441 Lumbago with sciatica, right side: Secondary | ICD-10-CM

## 2022-02-17 DIAGNOSIS — G8929 Other chronic pain: Secondary | ICD-10-CM | POA: Diagnosis not present

## 2022-02-17 DIAGNOSIS — M5442 Lumbago with sciatica, left side: Secondary | ICD-10-CM

## 2022-02-17 MED ORDER — PREDNISONE 5 MG (21) PO TBPK: ORAL_TABLET | ORAL | 0 refills | Status: DC

## 2022-02-17 NOTE — Progress Notes (Signed)
Office Visit Note   Patient: [name]           Date of Birth: 01-18-1946           MRN: [id number] Visit Date: 02/17/2022              Requested by: Antony Blackbird, MD [address], [place],  [place] [address] PCP: Antony Blackbird, MD   Assessment & Plan: Visit Diagnoses:  1. Chronic bilateral low back pain with bilateral sciatica   2. Spinal stenosis of cervical region     Plan: Patient still symptomatic C3-4 stenosis post solid Cloward fusion 20 years ago C4-C6 without hardware.  We will send in some prednisone 5 mg Dosepak she can take if her symptoms recur.  Currently today she is feeling better she will continue walking program and I will recheck her in a month.  Again today we discussed C3-4 disc arthroplasty and reviewed her previous MRI scan and plain radiographs as well as lumbar images.  Follow-Up Instructions: No follow-ups on file.   Orders:  Orders Placed This Encounter  Procedures   XR Lumbar Spine 2-3 Views   Meds ordered this encounter  Medications   predniSONE (STERAPRED UNI-PAK 21 TAB) 5 MG (21) TBPK tablet    Sig: Take 6,5,4,3,2,1 one less each day with food    Dispense:  21 tablet    Refill:  0      Procedures: No procedures performed   Clinical Data: No additional findings.   Subjective: Chief Complaint  Patient presents with   Lower Back - Pain    HPI 76 year old female here with low back pain and bilateral leg symptoms.  She is a smoker has hypertension and is hypercholesterol.  History of lung cancer.  She had normal arterial Dopplers ABI on 01/23/2021.  Patient said increased back symptoms she states she is a little bit better today no numbness or tingling.  She is on gabapentin.  She does have cervical stenosis C3-4 and has been cleared by her cardiologist for disc arthroplasty C3-4 but is wanting to get her lumbar symptoms improved first.  Radiographs demonstrate multilevel disc degeneration with asymmetric endplate  abutment at several levels.  She has degenerative anterolisthesis at L4-5.  Review of Systems history of lung cancer, breast cancer cervical spinal stenosis C3-4 with neck pain and hand tingling.   Objective: Vital Signs: BP 116/71   Pulse 62   Ht 5' (1.524 m)   Wt 237 lb (107.5 kg)   BMI 46.29 kg/m   Physical Exam Constitutional:      Appearance: She is well-developed.  HENT:     Head: Normocephalic.     Right Ear: External ear normal.     Left Ear: External ear normal. There is no impacted cerumen.  Eyes:     Pupils: Pupils are equal, round, and reactive to light.  Neck:     Thyroid: No thyromegaly.     Trachea: No tracheal deviation.  Cardiovascular:     Rate and Rhythm: Normal rate.  Pulmonary:     Effort: Pulmonary effort is normal.  Abdominal:     Palpations: Abdomen is soft.  Musculoskeletal:     Cervical back: No rigidity.  Skin:    General: Skin is warm and dry.  Neurological:     Mental Status: She is alert and oriented to person, place, and time.  Psychiatric:        Behavior: Behavior normal.     Ortho  Exam patient has negative logroll the hips she has trace Trendelenburg gait with ambulation.  No pain with internal rotation either hip trochanteric bursa is minimally tender knees reach full extension good quad and adductor strength.  Reflexes are intact anterior tib gastrocsoleus is strong.  No lower extremity clonus.  Specialty Comments:  No specialty comments available.  Imaging: No results found.   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   Polyp of colon 10/23/2019   Chronic respiratory failure with hypoxia ([place]) 04/01/2019   Medication management 02/28/2019   Combined forms of age-related cataract of both eyes 12/01/2018   Early stage nonexudative age-related macular degeneration of both eyes 12/01/2018   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   PVD (posterior vitreous detachment), left 11/28/2018   Dysphagia 12/30/2017   Asthma 04/12/2017   BMI 40.0-44.9, adult ([place]) 04/12/2017   Irritable bowel syndrome 04/12/2017   Osteopenia 78/24/2353   Umbilical hernia without obstruction and without gangrene 03/18/2017   Hiatal hernia 01/26/2017   Vitamin D deficiency 06/16/2016   Encounter for preoperative examination for general surgical procedure 03/06/2016   Hypercholesterolemia 02/25/2016   Hypertensive disorder 02/25/2016   Cough 08/02/2015   Obstructive sleep apnea 08/06/2013   Neoplasm of breast 07/20/2013   COPD with acute bronchitis ([place]) 06/18/2013   Myalgia 06/18/2013   History of laparoscopic adjustable gastric banding, APS. 09/04/2008. 04/08/2011   History of lung cancer 06/27/2010   SKIN RASH 12/13/2009   DYSPNEA 07/05/2008   DIABETES, TYPE 2 07/01/2008   Obesity, morbid ([place]) 07/01/2008   Type 2 diabetes mellitus without complications ([place]) 61/44/3154   COPD mixed type ([place]) 06/26/2008   Malignant neoplasm of lung ([place]) 07/20/2001   Past Medical History:  Diagnosis Date   Abdominal pain    around naval   Arthritis    Breast pain in female    Bruises easily    Cancer ([place]) 2003   lung   Cervical disc disease    COPD (chronic obstructive pulmonary disease) ([place])    O2 as needed.   DJD (degenerative joint disease)    Dyspnea    Gum disease    Hernia    Hypoglycemic episode in patient with diabetes mellitus ([place]) 2021   Diet controlled diabetes  with occasional hypoglycemia at night   Hypothyroidism    no meds   Insomnia    On home O2    as needed   Phlebitis    Pre-diabetes    Thyroid disease    Wears glasses    Weight increase     Family History  Problem Relation Age of Onset   Stroke Mother    Stroke Father     Past Surgical History:  Procedure Laterality Date   ABDOMINAL HYSTERECTOMY     partial   FOOT SURGERY     right   LAPAROSCOPIC GASTRIC BANDING  Feb 2010   LAPAROSCOPIC ROUX-EN-Y GASTRIC BYPASS WITH UPPER ENDOSCOPY AND  REMOVAL OF LAP BAND  2018   LEFT HEART CATHETERIZATION WITH CORONARY ANGIOGRAM N/A 05/02/2012   Procedure: LEFT HEART CATHETERIZATION WITH CORONARY ANGIOGRAM;  Surgeon: Laverda Page, MD;  Location: [facility] CATH LAB;  Service: Cardiovascular;  Laterality: N/A;   LUNG LOBECTOMY  2003   left   MASS EXCISION Right 06/06/2020   Procedure: REMOVAL MASS ON RIGHT BUTTOCK;  Surgeon: Michael Boston, MD;  Location: [facility] ORS;  Service: General;  Laterality: Right;   SPINE SURGERY     fusion on the neck   THYROIDECTOMY     partial   TONSILLECTOMY     VENTRAL HERNIA REPAIR N/A 06/06/2020   Procedure: [place] WITH MESH AND TAP BLOCK;  Surgeon: Michael Boston, MD;  Location: [facility] ORS;  Service: General;  Laterality: N/A;   Social History   Occupational History   Not on file  Tobacco Use   Smoking status: Former    Packs/day: 2.00    Years: 37.00    Total pack years: 74.00    Types: Cigarettes    Quit date: 06/21/2002    Years since quitting: 19.6   Smokeless tobacco: Never  Vaping Use   Vaping Use: Never used  Substance and Sexual Activity   Alcohol use: Yes    Comment: occ   Drug use: No   Sexual activity: Not on file

## 2022-02-18 ENCOUNTER — Ambulatory Visit: Payer: Medicare Other | Admitting: Podiatry

## 2022-02-25 ENCOUNTER — Ambulatory Visit: Payer: Medicare Other | Admitting: Podiatry

## 2022-03-09 ENCOUNTER — Ambulatory Visit (INDEPENDENT_AMBULATORY_CARE_PROVIDER_SITE_OTHER): Payer: Medicare Other | Admitting: Pulmonary Disease

## 2022-03-09 ENCOUNTER — Ambulatory Visit (INDEPENDENT_AMBULATORY_CARE_PROVIDER_SITE_OTHER): Payer: Medicare Other

## 2022-03-09 ENCOUNTER — Encounter: Payer: Self-pay | Admitting: Pulmonary Disease

## 2022-03-09 VITALS — BP 126/80 | HR 86 | Temp 98.4°F | Ht 60.0 in | Wt 229.2 lb

## 2022-03-09 DIAGNOSIS — J449 Chronic obstructive pulmonary disease, unspecified: Secondary | ICD-10-CM | POA: Diagnosis not present

## 2022-03-09 MED ORDER — PREDNISONE 20 MG PO TABS: ORAL_TABLET | ORAL | 0 refills | Status: DC

## 2022-03-09 MED ORDER — AZITHROMYCIN 250 MG PO TABS: ORAL_TABLET | ORAL | 0 refills | Status: DC

## 2022-03-09 NOTE — Progress Notes (Signed)
[name]    [id number]    1945/12/10  Primary Care Physician:Fulp, Cammie, MD  Referring Physician: Antony Blackbird, MD [address] [place],  [place] [address]  Chief complaint:   Acute visit for dyspnea, sinus congestion  HPI: 76 y.o. who  has a past medical history of Abdominal pain, Arthritis, Breast pain in female, Bruises easily, Cancer ([place]) (2003), Cervical disc disease, COPD (chronic obstructive pulmonary disease) ([place]), DJD (degenerative joint disease), Dyspnea, Gum disease, Hernia, Hypoglycemic episode in patient with diabetes mellitus ([place]) (2021), Hypothyroidism, Insomnia, On home O2, Phlebitis, Pre-diabetes, Thyroid disease, Wears glasses, and Weight increase.   She follows with Dr. Annamaria Boots for COPD, chronic hypoxic respiratory failure, left lower lobe resections due to lung cancer.  Complains of chest congestion, sinus pressure with sinus drainage, postnasal drip. She is having to use her oxygen more.  Outpatient Encounter Medications as of 03/09/2022  Medication Sig   acetaminophen (TYLENOL) 650 MG CR tablet Take 1,300 mg by mouth every 8 (eight) hours as needed for pain.   albuterol (PROVENTIL) (2.5 MG/3ML) 0.083% nebulizer solution USE 1 VIAL IN NEBULIZER EVERY 8 HOURS AS NEEDED FOR WHEEZING OR SHORTNESS OF BREATH   albuterol (VENTOLIN HFA) 108 (90 Base) MCG/ACT inhaler TAKE 2 PUFFS BY MOUTH EVERY 6 HOURS AS NEEDED FOR WHEEZE OR SHORTNESS OF BREATH   aspirin EC 81 MG tablet Take 1 tablet (81 mg total) by mouth daily. Swallow whole.   Biotin 5000 MCG CAPS 1 capsule   BREZTRI AEROSPHERE 160-9-4.8 MCG/ACT AERO INHALE 2 PUFFS INTO THE LUNGS TWICE A DAY   Calcium Carb-Cholecalciferol 600-800 MG-UNIT TABS calcium carbonate 600 mg-vitamin D3 20 mcg (800 unit) tablet  TAKE 1 TABLET BY MOUTH TWICE A DAY   cetirizine (ZYRTEC) 10 MG tablet Take 10 mg by mouth daily.   chlorpheniramine-HYDROcodone (TUSSIONEX PENNKINETIC ER) 10-8 MG/5ML Take 5 mLs  by mouth every 12 (twelve) hours as needed for cough.   diphenhydrAMINE (BENADRYL) 25 MG tablet 1 tablet at bedtime as needed   FREESTYLE INSULINX TEST test strip AS DIRECTED TO OBTAIN BLOOD TWICE A DAY AS NEEDED DX CODE E11.69 90 DAYS   gabapentin (NEURONTIN) 100 MG capsule Take 100 mg by mouth 3 (three) times daily.   glucose 4 GM chewable tablet Chew 20-24 g by mouth as needed for low blood sugar.   ketoconazole (NIZORAL) 2 % cream Apply 1 application topically daily. (Patient taking differently: Apply 1 application  topically daily as needed.)   Lancets (FREESTYLE) lancets    Magnesium 400 MG TABS Take 400 mg by mouth 2 (two) times daily.   Melatonin 10 MG CAPS Take 10 mg by mouth at bedtime as needed (sleep).   Multiple Vitamin (MULTIVITAMIN) tablet Take 1 tablet by mouth daily.   Nebulizers (COMPRESSOR/NEBULIZER) MISC Use as directed   olmesartan-hydrochlorothiazide (BENICAR HCT) 20-12.5 MG tablet TAKE 1 TABLET BY MOUTH EVERY DAY   Polyethyl Glycol-Propyl Glycol (SYSTANE OP) Place 1 drop into both eyes daily as needed (dry eyes).   predniSONE (STERAPRED UNI-PAK 21 TAB) 5 MG (21) TBPK tablet Take 6,5,4,3,2,1 one less each day with food   tiZANidine (ZANAFLEX) 4 MG tablet Take 4 mg by mouth 2 (two) times daily as needed.   triamcinolone cream (KENALOG) 0.1 % 1 application   nitroGLYCERIN (NITROSTAT) 0.4 MG SL tablet Place 1 tablet (0.4 mg total) under the tongue every 5 (five) minutes as needed for chest pain. If you require more than  two tablets five minutes apart go to the nearest ER via EMS.   rosuvastatin (CRESTOR) 10 MG tablet Take 1 tablet (10 mg total) by mouth at bedtime.   Facility-Administered Encounter Medications as of 03/09/2022  Medication   methylPREDNISolone acetate (DEPO-MEDROL) injection 80 mg    Allergies as of 03/09/2022   (No Known Allergies)    Past Medical History:  Diagnosis Date   Abdominal pain    around naval   Arthritis    Breast pain in female     Bruises easily    Cancer ([place]) 2003   lung   Cervical disc disease    COPD (chronic obstructive pulmonary disease) ([place])    O2 as needed.   DJD (degenerative joint disease)    Dyspnea    Gum disease    Hernia    Hypoglycemic episode in patient with diabetes mellitus ([place]) 2021   Diet controlled diabetes with occasional hypoglycemia at night   Hypothyroidism    no meds   Insomnia    On home O2    as needed   Phlebitis    Pre-diabetes    Thyroid disease    Wears glasses    Weight increase     Past Surgical History:  Procedure Laterality Date   ABDOMINAL HYSTERECTOMY     partial   FOOT SURGERY     right   LAPAROSCOPIC GASTRIC BANDING  Feb 2010   LAPAROSCOPIC ROUX-EN-Y GASTRIC BYPASS WITH UPPER ENDOSCOPY AND REMOVAL OF LAP BAND  2018   LEFT HEART CATHETERIZATION WITH CORONARY ANGIOGRAM N/A 05/02/2012   Procedure: LEFT HEART CATHETERIZATION WITH CORONARY ANGIOGRAM;  Surgeon: Laverda Page, MD;  Location: [facility] CATH LAB;  Service: Cardiovascular;  Laterality: N/A;   LUNG LOBECTOMY  2003   left   MASS EXCISION Right 06/06/2020   Procedure: REMOVAL MASS ON RIGHT BUTTOCK;  Surgeon: Michael Boston, MD;  [facility]: [facility];  Service: General;  Laterality: Right;   SPINE SURGERY     fusion on the neck   THYROIDECTOMY     partial   TONSILLECTOMY     VENTRAL HERNIA REPAIR N/A 06/06/2020   Procedure: [place] WITH MESH AND TAP BLOCK;  Surgeon: Michael Boston, MD;  Location: [facility];  Service: General;  Laterality: N/A;    Family History  Problem Relation Age of Onset   Stroke Mother    Stroke Father     Social History   Socioeconomic History   Marital status: Divorced    Spouse name: Not on file   Number of children: Not on file   Years of education: Not on file   Highest education level: Not on file  Occupational History   Not on file  Tobacco Use   Smoking status: Former    Packs/day: 2.00    Years: 37.00    Total pack years:  74.00    Types: Cigarettes    Quit date: 06/21/2002    Years since quitting: 19.7   Smokeless tobacco: Never  Vaping Use   Vaping Use: Never used  Substance and Sexual Activity   Alcohol use: Yes    Comment: occ   Drug use: No   Sexual activity: Not on file  Other Topics Concern   Not on file  Social History Narrative   Not on file   Social Determinants of Health   Financial Resource Strain: Not on file  Food Insecurity: Not on file  Transportation Needs: Not on file  Physical  Activity: Not on file  Stress: Not on file  Social Connections: Not on file  Intimate Partner Violence: Not on file    Review of systems: Review of Systems  Constitutional: Negative for fever and chills.  HENT: Negative.   Eyes: Negative for blurred vision.  Respiratory: as per HPI  Cardiovascular: Negative for chest pain and palpitations.  Gastrointestinal: Negative for vomiting, diarrhea, blood per rectum. Genitourinary: Negative for dysuria, urgency, frequency and hematuria.  Musculoskeletal: Negative for myalgias, back pain and joint pain.  Skin: Negative for itching and rash.  Neurological: Negative for dizziness, tremors, focal weakness, seizures and loss of consciousness.  Endo/Heme/Allergies: Negative for environmental allergies.  Psychiatric/Behavioral: Negative for depression, suicidal ideas and hallucinations.  All other systems reviewed and are negative.  Physical Exam: Blood pressure 126/80, pulse 86, temperature 98.4 F (36.9 C), temperature source Oral, height 5' (1.524 m), weight 229 lb 3.2 oz (104 kg), SpO2 96 %. Gen:      No acute distress HEENT:  EOMI, sclera anicteric Neck:     No masses; no thyromegaly Lungs:    Clear to auscultation bilaterally; normal respiratory effort CV:         Regular rate and rhythm; no murmurs Abd:      + bowel sounds; soft, non-tender; no palpable masses, no distension Ext:    No edema; adequate peripheral perfusion Skin:      Warm and dry; no  rash Neuro: alert and oriented x 3 Psych: normal mood and affect  Assessment:  Acute visit for sinusitis, dyspnea Prescribe Z-Pak, prednisone 40 mg a day for 5 days Use Flonase, saline nasal sprays Zyrtec over-the-counter Chest x-ray.  Marshell Garfinkel MD [facility] 03/09/2022, 2:12 PM  CC: Antony Blackbird, MD

## 2022-03-09 NOTE — Patient Instructions (Signed)
I am sorry you are not feeling well We will send in a prescription for Z-Pak, prednisone 40 mg a day for 5 days Use Flonase, Zyrtec and saline nasal spray We will get a chest x-ray today  Follow-up with Dr. Annamaria Boots as scheduled.

## 2022-03-09 NOTE — Addendum Note (Signed)
Addended by: Elton Sin on: 03/09/2022 02:28 PM   Modules accepted: Orders

## 2022-03-10 ENCOUNTER — Ambulatory Visit: Payer: Medicare Other | Admitting: Internal Medicine

## 2022-03-12 ENCOUNTER — Ambulatory Visit (INDEPENDENT_AMBULATORY_CARE_PROVIDER_SITE_OTHER): Payer: Medicare Other | Admitting: Internal Medicine

## 2022-03-12 ENCOUNTER — Encounter: Payer: Self-pay | Admitting: Internal Medicine

## 2022-03-12 VITALS — BP 90/60 | HR 68 | Ht 60.0 in | Wt 228.0 lb

## 2022-03-12 DIAGNOSIS — R1310 Dysphagia, unspecified: Secondary | ICD-10-CM

## 2022-03-12 DIAGNOSIS — K5900 Constipation, unspecified: Secondary | ICD-10-CM

## 2022-03-12 DIAGNOSIS — Z86010 Personal history of colonic polyps: Secondary | ICD-10-CM

## 2022-03-12 NOTE — Progress Notes (Signed)
Chief Complaint: Constipation, dysphagia  HPI : 76 year old female with history of gastric bypass, obesity, COPD on oxygen PRN, lung cancer, hypothyroidism, abdominal wall hernia s/p repair presents with constipation and dysphagia  She still deals with constipation. If she notices the constipation coming on, she will take Miralax and mineral oil, which helps to keep her regular. She has tried Linzess in the past, which caused her to develop diarrhea. She on average has one BM per day. She has a lot of swallowing issues. She will get strangled on a sip of water. She has been having swallowing issues for years. This dysphagia does not seem to be worsening over time. She has learned to sip through a straw and to take smaller bites. The dysphagia is primarily to liquids, not as much with solids. The dysphagia occurs at the bottom of throat. She had a swallowing test done by a speech therapist that said she had weak muscles. She is going to have neck surgery for her cervical spine soon. She has had ab pain issues in the past, but she is not currently having any ab pain at this time. Denies blood in stools, N&V, weight loss. Denies family history of GI issues to her knowledge. Her last colonoscopy was in 2021 and she was recommended for 5 year follow up. Denies prior EGD. Patient was recently started on Lasix to help with swelling in her legs.  Wt Readings from Last 3 Encounters:  03/12/22 228 lb (103.4 kg)  03/09/22 229 lb 3.2 oz (104 kg)  02/17/22 237 lb (107.5 kg)   Past Medical History:  Diagnosis Date   Abdominal pain    around naval   Arthritis    Breast pain in female    Bruises easily    Cancer ([place]) 2003   lung   Cervical disc disease    COPD (chronic obstructive pulmonary disease) ([place])    O2 as needed.   DJD (degenerative joint disease)    Dyspnea    Gum disease    Hernia    Hypoglycemic episode in patient with diabetes mellitus ([place]) 2021   Diet controlled diabetes with  occasional hypoglycemia at night   Hypothyroidism    no meds   Insomnia    On home O2    as needed   Phlebitis    Pre-diabetes    Thyroid disease    Wears glasses    Weight increase      Past Surgical History:  Procedure Laterality Date   ABDOMINAL HYSTERECTOMY     partial   FOOT SURGERY     right   LAPAROSCOPIC GASTRIC BANDING  Feb 2010   LAPAROSCOPIC ROUX-EN-Y GASTRIC BYPASS WITH UPPER ENDOSCOPY AND REMOVAL OF LAP BAND  2018   LEFT HEART CATHETERIZATION WITH CORONARY ANGIOGRAM N/A 05/02/2012   Procedure: LEFT HEART CATHETERIZATION WITH CORONARY ANGIOGRAM;  Surgeon: Laverda Page, MD;  Location: [facility] CATH LAB;  Service: Cardiovascular;  Laterality: N/A;   LUNG LOBECTOMY  2003   left   MASS EXCISION Right 06/06/2020   Procedure: REMOVAL MASS ON RIGHT BUTTOCK;  Surgeon: Michael Boston, MD;  Location: [facility] ORS;  Service: General;  Laterality: Right;   SPINE SURGERY     fusion on the neck   THYROIDECTOMY     partial   TONSILLECTOMY     VENTRAL HERNIA REPAIR N/A 06/06/2020   Procedure: [place] WITH MESH AND TAP BLOCK;  Surgeon: Michael Boston, MD;  Location: [facility] ORS;  Service: General;  Laterality: N/A;   Family History  Problem Relation Age of Onset   Stroke Mother    Stroke Father    Social History   Tobacco Use   Smoking status: Former    Packs/day: 2.00    Years: 37.00    Total pack years: 74.00    Types: Cigarettes    Quit date: 06/21/2002    Years since quitting: 19.7   Smokeless tobacco: Never  Vaping Use   Vaping Use: Never used  Substance Use Topics   Alcohol use: Yes    Comment: occ   Drug use: No   Current Outpatient Medications  Medication Sig Dispense Refill   acetaminophen (TYLENOL) 650 MG CR tablet Take 1,300 mg by mouth every 8 (eight) hours as needed for pain.     albuterol (PROVENTIL) (2.5 MG/3ML) 0.083% nebulizer solution USE 1 VIAL IN NEBULIZER EVERY 8 HOURS AS NEEDED FOR WHEEZING OR SHORTNESS OF  BREATH 375 mL 11   albuterol (VENTOLIN HFA) 108 (90 Base) MCG/ACT inhaler TAKE 2 PUFFS BY MOUTH EVERY 6 HOURS AS NEEDED FOR WHEEZE OR SHORTNESS OF BREATH 18 each 12   aspirin EC 81 MG tablet Take 1 tablet (81 mg total) by mouth daily. Swallow whole. 30 tablet 11   azithromycin (ZITHROMAX) 250 MG tablet Take as directed 6 tablet 0   BREZTRI AEROSPHERE 160-9-4.8 MCG/ACT AERO INHALE 2 PUFFS INTO THE LUNGS TWICE A DAY 10.7 each 12   cetirizine (ZYRTEC) 10 MG tablet Take 10 mg by mouth daily.     chlorpheniramine-HYDROcodone (TUSSIONEX PENNKINETIC ER) 10-8 MG/5ML Take 5 mLs by mouth every 12 (twelve) hours as needed for cough. 115 mL 0   diphenhydrAMINE (BENADRYL) 25 MG tablet 1 tablet at bedtime as needed     FREESTYLE INSULINX TEST test strip AS DIRECTED TO OBTAIN BLOOD TWICE A DAY AS NEEDED DX CODE E11.69 90 DAYS     gabapentin (NEURONTIN) 100 MG capsule Take 100 mg by mouth 3 (three) times daily.     glucose 4 GM chewable tablet Chew 20-24 g by mouth as needed for low blood sugar.     ketoconazole (NIZORAL) 2 % cream Apply 1 application topically daily. (Patient taking differently: Apply 1 application  topically daily as needed.) 60 g 2   Lancets (FREESTYLE) lancets      Magnesium 400 MG TABS Take 400 mg by mouth 2 (two) times daily.     Melatonin 10 MG CAPS Take 10 mg by mouth at bedtime as needed (sleep).     Multiple Vitamin (MULTIVITAMIN) tablet Take 1 tablet by mouth daily.     Nebulizers (COMPRESSOR/NEBULIZER) MISC Use as directed 1 each prn   olmesartan-hydrochlorothiazide (BENICAR HCT) 20-12.5 MG tablet TAKE 1 TABLET BY MOUTH EVERY DAY 30 tablet 0   Polyethyl Glycol-Propyl Glycol (SYSTANE OP) Place 1 drop into both eyes daily as needed (dry eyes).     predniSONE (DELTASONE) 20 MG tablet Take 40mg  for 5 days 10 tablet 0   predniSONE (STERAPRED UNI-PAK 21 TAB) 5 MG (21) TBPK tablet Take 6,5,4,3,2,1 one less each day with food 21 tablet 0   tiZANidine (ZANAFLEX) 4 MG tablet Take 4 mg by  mouth 2 (two) times daily as needed.     triamcinolone cream (KENALOG) 0.1 % 1 application     nitroGLYCERIN (NITROSTAT) 0.4 MG SL tablet Place 1 tablet (0.4 mg total) under the tongue every 5 (five) minutes as needed for chest pain. If you require more than two  tablets five minutes apart go to the nearest ER via EMS. 30 tablet 0   rosuvastatin (CRESTOR) 10 MG tablet Take 1 tablet (10 mg total) by mouth at bedtime. 90 tablet 0   Current Facility-Administered Medications  Medication Dose Route Frequency Provider Last Rate Last Admin   methylPREDNISolone acetate (DEPO-MEDROL) injection 80 mg  80 mg Intramuscular Once Baird [place] D, MD       No Known Allergies   Review of Systems: All systems reviewed and negative except where noted in HPI.   Physical Exam: BP 90/60   Pulse 68   Ht 5' (1.524 m)   Wt 228 lb (103.4 kg)   BMI 44.53 kg/m  Constitutional: Pleasant,well-developed, female in no acute distress. HEENT: Normocephalic and atraumatic. Conjunctivae are normal. No scleral icterus. Cardiovascular: Normal rate, regular rhythm.  Pulmonary/chest: Effort normal and breath sounds normal. No wheezing, rales or rhonchi. Abdominal: Soft, nondistended, nontender. Bowel sounds active throughout. There are no masses palpable. No hepatomegaly. Extremities: 2+ BLE Neurological: Alert and oriented to person place and time. Skin: Skin is warm and dry. No rashes noted. Psychiatric: Normal mood and affect. Behavior is normal.  Labs 06/2021: CBC and CMP unremarkable.  KUB 04/16/21: IMPRESSION:  Minimal retained fecal material.  No signs of constipation.   MRI Abdomen w/contrast 05/27/21: IMPRESSION:  1. 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  2. Small  Bosniak class 2 proteinaceous/hemorrhagic cyst in the lower  pole of the right kidney.    Abd U/S 11/20/21: IMPRESSION: 1. No acute abnormality of the abdomen. 2. Bilateral renal lesions are again seen. These were better evaluated on prior MRI of the abdomen from 05/27/2021 and shown to be benign on that exam.  Colonoscopy 01/15/11: 5 colon polyps, diverticulosis, internal hemorrhoids Path: Hyperplastic polyps  "Colonoscopy (5/21) = Sigmoid diverticulosis and internal hemorrhoids. CABA not recommended. Colonoscopy (11/17) = Diverticulosis, hemorrhoids, and a TA removed from Sigmoid. Colonoscopy (6/12 @ [facility]) PHxP = tortuous colon w/ diffuse diverticulosis w/ excessive looping, fair prep in Cecum/proximal AC (good after levage), medium-sized internal hemorrhoids, and five small sessile rectosigmoid polyps were removed (path report unavailable in [facility]where and not included in scanned document)." - obtained from Dr. Randye Lobo last OSH GI note  ASSESSMENT AND PLAN: Constipation Dysphagia History of colon polyps Patient presents to establish care. She has chronic constipation issues for which she uses Miralax and mineral oil PRN. Also has history of dysphagia suspected to be due to oropharyngeal dysphagia. I did offer the patient the opportunity to get an EGD to rule out esophageal sources of dysphagia, but the patient declined at this time. Aspiration precautions seem to be working to help with her dysphagia. Patient has history of colon polyps and is presumably due in 11/2024 based upon a 5 year follow up recommendation. - Drink 8 cups of water, 30 minutes of physical activity per day, and daily fiber supplement - Miralax PRN - Continue aspiration precautions - Get prior colonoscopies from 2017 and 2021 from [facility]. Presumably her next colonoscopy would be due in 11/2024 based upon a 5 years follow up recommendation. - RTC PRN  Christia Reading, MD  I spent 46 minutes of time, including in depth  chart review, independent review of results as outlined above, communicating results with the patient directly,  face-to-face time with the patient, coordinating care, ordering studies and medications as appropriate, and documentation.

## 2022-03-12 NOTE — Patient Instructions (Signed)
Drink 8 cups of water a day and walk 30 minutes a day.  Please purchase the following medications over the counter and take as directed: Fiber supplement such as Benefiber- use as directed daily Miralax: Take as needed achieve regular bowel movements.  _______________________________________________________  If you are age 77 or older, your body mass index should be between 23-30. Your Body mass index is 44.53 kg/m. If this is out of the aforementioned range listed, please consider follow up with your Primary Care Provider.  If you are age 66 or younger, your body mass index should be between 19-25. Your Body mass index is 44.53 kg/m. If this is out of the aformentioned range listed, please consider follow up with your Primary Care Provider.   ________________________________________________________  The [place] [facility] providers would like to encourage you to use [facility] to communicate with providers for non-urgent requests or questions.  Due to long hold times on the telephone, sending your provider a message by [facility] may be a faster and more efficient way to get a response.  Please allow 48 business hours for a response.  Please remember that this is for non-urgent requests.  _______________________________________________________

## 2022-03-20 ENCOUNTER — Encounter: Payer: Self-pay | Admitting: Internal Medicine

## 2022-03-20 DIAGNOSIS — J189 Pneumonia, unspecified organism: Secondary | ICD-10-CM

## 2022-03-20 HISTORY — DX: Pneumonia, unspecified organism: J18.9

## 2022-03-20 NOTE — Assessment & Plan Note (Signed)
I am concerned that her cough reflects dysphagia and some LPR.  We can help suppress cough some with cough syrup but she needs to discuss all this if she establishes with her new GI doctor.  Emphasis for now on reflux precautions and careful swallowing with chin tuck.

## 2022-03-20 NOTE — Assessment & Plan Note (Signed)
Continues to benefit from oxygen during sleep especially Plan-continue O2 2 L at night and with exertion.

## 2022-03-24 ENCOUNTER — Encounter: Payer: Self-pay | Admitting: Orthopaedic Surgery

## 2022-03-24 ENCOUNTER — Ambulatory Visit (INDEPENDENT_AMBULATORY_CARE_PROVIDER_SITE_OTHER): Payer: Medicare Other | Admitting: Orthopaedic Surgery

## 2022-03-24 VITALS — BP 114/69 | HR 72 | Ht 60.0 in | Wt 230.0 lb

## 2022-03-24 DIAGNOSIS — M4802 Spinal stenosis, cervical region: Secondary | ICD-10-CM | POA: Diagnosis not present

## 2022-03-24 NOTE — Progress Notes (Addendum)
Office Visit Note   Patient: [name]           Date of Birth: 1945-12-15           MRN: [id number] Visit Date: 03/24/2022              Requested by: Antony Blackbird, MD [address], [place],  [place] [address] PCP: Antony Blackbird, MD   Assessment & Plan: Visit Diagnoses:  1. Spinal stenosis of cervical region     Plan: Patient with progressive gait problems with cervical stenosis C3-4 above 2 level old fusion from 20 years ago C4-C6 which is solidly healed.  Plan would be single level decompression with allograft and plate at the O2-7 level where she has stenosis.  Plan to be overnight stay.  She would need pulmonary clearance she had seen Dr. Keturah Barre in the past and also Dr.Preveen Mannam.  Today's visit we reviewed her MRI scan plain radiographs previous surgery, plain x-rays.  Discussed facet arthropathy and recommendation for single level cervical fusion versus disc arthroplasty.  Daughter was present including in discussion.  Procedure discussed for surgery risk surgery discussed.  She is already had previous cervical fusion and is aware of problems that can occur.  Questions were elicited and answered.  Follow-Up Instructions: No follow-ups on file.   Orders:  No orders of the defined types were placed in this encounter.  No orders of the defined types were placed in this encounter.     Procedures: No procedures performed   Clinical Data: No additional findings.   Subjective: Chief Complaint  Patient presents with   Neck - Pain, Follow-up   Lower Back - Follow-up, Pain    HPI 76 year old female previous [place] two-level cervical fusion C4-C6 without hardware 20+ years ago.  She has had progressive weakness and gait problems and has been using a rolling walker with reverse seat for more than a year most of the time to ambulate.  Sometimes in the kitchen she grabs a hold of the counter with her hands.  MRI scan shows stenosis at C3-4  with bilateral facet arthropathy and moderate foraminal narrowing.  With her foraminal narrowing despite having previous fusion today we discussed fusion at the C3-4 level versus cervical disc arthroplasty.  Daughter was present for the discussion.  Review of Systems last A1c 6.1.  Positive for low back pain with disc protrusions at all lumbar levels and degenerative anterolisthesis at L4-5.  Patient has chronic respiratory problems with hypoxia and uses intermittent oxygen.  History of COPD.   Objective: Vital Signs: BP 114/69   Pulse 72   Ht 5' (1.524 m)   Wt 230 lb (104.3 kg)   BMI 44.92 kg/m   Physical Exam Constitutional:      Appearance: She is well-developed.  HENT:     Head: Normocephalic.     Right Ear: External ear normal.     Left Ear: External ear normal. There is no impacted cerumen.  Eyes:     Pupils: Pupils are equal, round, and reactive to light.  Neck:     Thyroid: No thyromegaly.     Trachea: No tracheal deviation.  Cardiovascular:     Rate and Rhythm: Normal rate.  Pulmonary:     Effort: Pulmonary effort is normal.  Abdominal:     Palpations: Abdomen is soft.  Musculoskeletal:     Cervical back: No rigidity.  Skin:    General: Skin is warm and dry.  Neurological:  Mental Status: She is alert and oriented to person, place, and time.  Psychiatric:        Behavior: Behavior normal.     Ortho Exam negative logroll of the hips.  Lower extremity reflexes are intact no clonus.  She has increased pain with cervical compression bilateral brachial plexus tenderness.  Negative Lhermitte.   Specialty Comments:  No specialty comments available.  Imaging: No results found.   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   Polyp of colon 10/23/2019   Chronic respiratory failure with hypoxia ([place]) 04/01/2019   Medication management 02/28/2019   Combined forms of age-related cataract of both eyes 12/01/2018   Early stage nonexudative age-related macular degeneration of both eyes 12/01/2018   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   PVD (posterior vitreous detachment), left 11/28/2018   Dysphagia 12/30/2017   Asthma 04/12/2017   BMI 40.0-44.9, adult ([place]) 04/12/2017   Irritable bowel syndrome 04/12/2017   Osteopenia 19/14/7829   Umbilical hernia without obstruction and without gangrene 03/18/2017   Hiatal hernia 01/26/2017   Vitamin D deficiency 06/16/2016   Encounter for preoperative examination for general surgical procedure 03/06/2016   Hypercholesterolemia 02/25/2016   Hypertensive disorder 02/25/2016   Cough 08/02/2015   Obstructive sleep apnea 08/06/2013   Neoplasm of breast 07/20/2013   COPD with acute bronchitis ([place]) 06/18/2013   Myalgia 06/18/2013   History of laparoscopic adjustable gastric banding, APS. 09/04/2008. 04/08/2011   History of lung cancer 06/27/2010   SKIN RASH 12/13/2009   DYSPNEA 07/05/2008   DIABETES, TYPE 2 07/01/2008   Obesity, morbid ([place]) 07/01/2008   Type 2 diabetes mellitus without complications ([place]) 56/21/3086   COPD mixed type ([place]) 06/26/2008   Malignant neoplasm of lung ([place]) 07/20/2001   Past Medical History:  Diagnosis Date   Abdominal pain    around naval   Arthritis    Breast pain in female    Bruises easily    Cancer ([place]) 2003   lung   Cervical disc disease    COPD (chronic obstructive pulmonary disease) ([place])    O2 as  needed.   DJD (degenerative joint disease)    Dyspnea    Gum disease    Hernia    Hypoglycemic episode in patient with diabetes mellitus ([place]) 2021   Diet controlled diabetes with occasional hypoglycemia at night   Hypothyroidism    no meds   Insomnia    On home O2    as needed   Phlebitis    Pre-diabetes    Thyroid disease    Wears glasses    Weight increase     Family History  Problem Relation Age of Onset   Stroke Mother    Stroke Father     Past Surgical History:  Procedure Laterality Date   ABDOMINAL HYSTERECTOMY     partial   FOOT SURGERY     right   LAPAROSCOPIC GASTRIC BANDING  Feb 2010  LAPAROSCOPIC ROUX-EN-Y GASTRIC BYPASS WITH UPPER ENDOSCOPY AND REMOVAL OF LAP BAND  2018   LEFT HEART CATHETERIZATION WITH CORONARY ANGIOGRAM N/A 05/02/2012   Procedure: LEFT HEART CATHETERIZATION WITH CORONARY ANGIOGRAM;  Surgeon: Laverda Page, MD;  Location: [facility] CATH LAB;  Service: Cardiovascular;  Laterality: N/A;   LUNG LOBECTOMY  2003   left   MASS EXCISION Right 06/06/2020   Procedure: REMOVAL MASS ON RIGHT BUTTOCK;  Surgeon: Michael Boston, MD;  Location: [facility]S;  Service: General;  Laterality: Right;   SPINE SURGERY     fusion on the neck   THYROIDECTOMY     partial   TONSILLECTOMY     VENTRAL HERNIA REPAIR N/A 06/06/2020   Procedure: [place] WITH MESH AND TAP BLOCK;  Surgeon: Michael [place], MD;  Location: [facility] ORS;  Service: General;  Laterality: N/A;   Social History   Occupational History   Not on file  Tobacco Use   Smoking status: Former    Packs/day: 2.00    Years: 37.00    Total pack years: 74.00    Types: Cigarettes    Quit date: 06/21/2002    Years since quitting: 19.7   Smokeless tobacco: Never  Vaping Use   Vaping Use: Never used  Substance and Sexual Activity   Alcohol use: Yes    Comment: occ   Drug use: No   Sexual activity: Not on file

## 2022-03-31 ENCOUNTER — Ambulatory Visit (INDEPENDENT_AMBULATORY_CARE_PROVIDER_SITE_OTHER): Payer: Medicare Other | Admitting: Primary Care

## 2022-03-31 ENCOUNTER — Encounter: Payer: Self-pay | Admitting: Primary Care

## 2022-03-31 ENCOUNTER — Ambulatory Visit (INDEPENDENT_AMBULATORY_CARE_PROVIDER_SITE_OTHER): Payer: Medicare Other

## 2022-03-31 VITALS — BP 112/68 | HR 69 | Temp 98.0°F | Ht 60.0 in | Wt 227.2 lb

## 2022-03-31 DIAGNOSIS — R0602 Shortness of breath: Secondary | ICD-10-CM

## 2022-03-31 DIAGNOSIS — M7989 Other specified soft tissue disorders: Secondary | ICD-10-CM | POA: Diagnosis not present

## 2022-03-31 DIAGNOSIS — J440 Chronic obstructive pulmonary disease with acute lower respiratory infection: Secondary | ICD-10-CM

## 2022-03-31 DIAGNOSIS — J189 Pneumonia, unspecified organism: Secondary | ICD-10-CM | POA: Diagnosis not present

## 2022-03-31 DIAGNOSIS — J0100 Acute maxillary sinusitis, unspecified: Secondary | ICD-10-CM | POA: Diagnosis not present

## 2022-03-31 DIAGNOSIS — Z23 Encounter for immunization: Secondary | ICD-10-CM | POA: Diagnosis not present

## 2022-03-31 DIAGNOSIS — J449 Chronic obstructive pulmonary disease, unspecified: Secondary | ICD-10-CM

## 2022-03-31 DIAGNOSIS — J209 Acute bronchitis, unspecified: Secondary | ICD-10-CM

## 2022-03-31 LAB — BASIC METABOLIC PANEL
BUN: 30 mg/dL — ABNORMAL HIGH (ref 6–23)
CO2: 29 mEq/L (ref 19–32)
Calcium: 9.3 mg/dL (ref 8.4–10.5)
Chloride: 106 mEq/L (ref 96–112)
Creatinine, Ser: 1.32 mg/dL — ABNORMAL HIGH (ref 0.40–1.20)
GFR: 39.14 mL/min — ABNORMAL LOW (ref >60.00)
Glucose, Bld: 86 mg/dL (ref 70–99)
Potassium: 4.2 mEq/L (ref 3.5–5.1)
Sodium: 144 mEq/L (ref 135–145)

## 2022-03-31 LAB — BRAIN NATRIURETIC PEPTIDE: Pro B Natriuretic peptide (BNP): 13 pg/mL (ref 0.0–100.0)

## 2022-03-31 MED ORDER — PREDNISONE 10 MG PO TABS: ORAL_TABLET | ORAL | 0 refills | Status: DC

## 2022-03-31 MED ORDER — STIOLTO RESPIMAT 2.5-2.5 MCG/ACT IN AERS: 2.0000 | INHALATION_SPRAY | Freq: Every day | RESPIRATORY_TRACT | 0 refills | Status: DC

## 2022-03-31 NOTE — Assessment & Plan Note (Signed)
-   She did not tolerate Breztri due to muscle cramps. Recommend resuming Stiolto Respimat 2 puffs once daily

## 2022-03-31 NOTE — Patient Instructions (Addendum)
Recommendations: Start mucinex 1200mg  twice daily x 5-7 days Start fluticasone nasal spray 1 puff per nostril once daily Continue saline nasal spray twice daily  Try using humidifier in bedtime at night  Take prednisone taper as directed Stop [employer]. Start Stiolto respimat 2 puffs once daily in the morning   Orders:  CXR/ DG sinus complete   Follow-up: If symptoms do not improve/ January with Dr. Annamaria Boots

## 2022-03-31 NOTE — Progress Notes (Signed)
@Patient  ID: [name], female    DOB: 04/23/1946, 76 y.o.   MRN: [id number]  No chief complaint on file.   Referring provider: Antony Blackbird, MD  HPI: 76 year old female, former smoker.  Past medical history significant for COPD mixed type, obstructive sleep apnea, chronic respiratory failure with hypoxia, type 2 diabetes, obesity.  Patient of Dr. Annamaria Boots, seen for an acute office visit on 03/09/2022 by Dr. Vaughan Browner for dyspnea and nasal/chest congestion.  03/31/2022 She presents today for 1 month follow-up for acute sinusitis.  She was prescribed Z-Pak and prednisone 40 mg x 5 days at her last office visit in August with Dr. Vaughan Browner.  Was advised to start Flonase and saline nasal sprays.  Chest x-ray at that time showed possible pneumonia to left lung base.  She was advised to complete antibiotics. She stopped Breztri d/t severe muscle cramps. Symptoms subsided after stopping inhaler. Breathing is some worse being off maintenance inhaler. She complains of head "fullness". Sinus congestion started 3-4 weeks ago. She is getting up bloody nasal drainage. She is using saline nasal spray at night. She did not try fluticasone. She is using Vicks steamer.   Mucinex   No Known Allergies  Immunization History  Administered Date(s) Administered   DT (Pediatric) 05/30/2012   Fluad Quad(high Dose 65+) 03/24/2019, 03/26/2020   H1N1 06/26/2008   Influenza Split 05/03/2009, 05/11/2011, 05/24/2012, 04/19/2013, 05/15/2014, 03/24/2019   Influenza Whole 05/03/2009   Influenza, High Dose Seasonal PF 05/03/2017, 04/29/2018, 05/15/2021   Influenza, Seasonal, Injecte, Preservative Fre 05/15/2014   Influenza-Unspecified 05/11/2011, 05/24/2012, 04/19/2013, 05/15/2014, 05/21/2015, 03/24/2019, 03/26/2020   PFIZER Comirnaty(Gray Top)Covid-19 Tri-Sucrose Vaccine 08/26/2019, 09/16/2019, 03/26/2020, 12/10/2020   PFIZER(Purple Top)SARS-COV-2 Vaccination 08/26/2019, 09/16/2019, 03/26/2020   Pfizer Covid-19  Vaccine Bivalent Booster 68yrs & up 05/15/2021   Pneumococcal Conjugate-13 07/04/2015   Pneumococcal Polysaccharide-23 01/03/2004   Tdap 03/21/2021   Varicella 06/29/2012, 08/04/2012   Zoster Recombinat (Shingrix) 03/21/2021, 11/18/2021    Past Medical History:  Diagnosis Date   Abdominal pain    around naval   Arthritis    Breast pain in female    Bruises easily    Cancer ([place]) 2003   lung   Cervical disc disease    COPD (chronic obstructive pulmonary disease) ([place])    O2 as needed.   DJD (degenerative joint disease)    Dyspnea    Gum disease    Hernia    Hypoglycemic episode in patient with diabetes mellitus ([place]) 2021   Diet controlled diabetes with occasional hypoglycemia at night   Hypothyroidism    no meds   Insomnia    On home O2    as needed   Phlebitis    Pre-diabetes    Thyroid disease    Wears glasses    Weight increase     Tobacco History: Social History   Tobacco Use  Smoking Status Former   Packs/day: 2.00   Years: 37.00   Total pack years: 74.00   Types: Cigarettes   Quit date: 06/21/2002   Years since quitting: 19.7  Smokeless Tobacco Never   Counseling given: Not Answered   Outpatient Medications Prior to Visit  Medication Sig Dispense Refill   acetaminophen (TYLENOL) 650 MG CR tablet Take 1,300 mg by mouth every 8 (eight) hours as needed for pain.     albuterol (PROVENTIL) (2.5 MG/3ML) 0.083% nebulizer solution USE 1 VIAL IN NEBULIZER EVERY 8 HOURS AS NEEDED FOR WHEEZING OR SHORTNESS OF BREATH 375 mL 11   albuterol (VENTOLIN  HFA) 108 (90 Base) MCG/ACT inhaler TAKE 2 PUFFS BY MOUTH EVERY 6 HOURS AS NEEDED FOR WHEEZE OR SHORTNESS OF BREATH 18 each 12   aspirin EC 81 MG tablet Take 1 tablet (81 mg total) by mouth daily. Swallow whole. 30 tablet 11   cetirizine (ZYRTEC) 10 MG tablet Take 10 mg by mouth daily.     chlorpheniramine-HYDROcodone (TUSSIONEX PENNKINETIC ER) 10-8 MG/5ML Take 5 mLs by mouth every 12 (twelve) hours as needed for cough.  115 mL 0   diphenhydrAMINE (BENADRYL) 25 MG tablet 1 tablet at bedtime as needed     FREESTYLE INSULINX TEST test strip AS DIRECTED TO OBTAIN BLOOD TWICE A DAY AS NEEDED DX CODE E11.69 90 DAYS     gabapentin (NEURONTIN) 100 MG capsule Take 100 mg by mouth 3 (three) times daily.     glucose 4 GM chewable tablet Chew 20-24 g by mouth as needed for low blood sugar.     ketoconazole (NIZORAL) 2 % cream Apply 1 application topically daily. (Patient taking differently: Apply 1 application  topically daily as needed.) 60 g 2   Lancets (FREESTYLE) lancets      Magnesium 400 MG TABS Take 400 mg by mouth 2 (two) times daily.     Melatonin 10 MG CAPS Take 10 mg by mouth at bedtime as needed (sleep).     Multiple Vitamin (MULTIVITAMIN) tablet Take 1 tablet by mouth daily.     Nebulizers (COMPRESSOR/NEBULIZER) MISC Use as directed 1 each prn   olmesartan-hydrochlorothiazide (BENICAR HCT) 20-12.5 MG tablet TAKE 1 TABLET BY MOUTH EVERY DAY 30 tablet 0   Polyethyl Glycol-Propyl Glycol (SYSTANE OP) Place 1 drop into both eyes daily as needed (dry eyes).     tiZANidine (ZANAFLEX) 4 MG tablet Take 4 mg by mouth 2 (two) times daily as needed.     triamcinolone cream (KENALOG) 0.1 % 1 application     nitroGLYCERIN (NITROSTAT) 0.4 MG SL tablet Place 1 tablet (0.4 mg total) under the tongue every 5 (five) minutes as needed for chest pain. If you require more than two tablets five minutes apart go to the nearest ER via EMS. 30 tablet 0   rosuvastatin (CRESTOR) 10 MG tablet Take 1 tablet (10 mg total) by mouth at bedtime. 90 tablet 0   azithromycin (ZITHROMAX) 250 MG tablet Take as directed 6 tablet 0   BREZTRI AEROSPHERE 160-9-4.8 MCG/ACT AERO INHALE 2 PUFFS INTO THE LUNGS TWICE A DAY 10.7 each 12   predniSONE (DELTASONE) 20 MG tablet Take 40mg  for 5 days 10 tablet 0   predniSONE (STERAPRED UNI-PAK 21 TAB) 5 MG (21) TBPK tablet Take 6,5,4,3,2,1 one less each day with food 21 tablet 0   methylPREDNISolone acetate  (DEPO-MEDROL) injection 80 mg      No facility-administered medications prior to visit.    Review of Systems  Review of Systems  Constitutional: Negative.   HENT:  Positive for congestion.   Respiratory:  Positive for cough and shortness of breath. Negative for chest tightness and wheezing.   Cardiovascular:  Positive for leg swelling.    Physical Exam  BP 112/68 (BP Location: Left Arm, Patient Position: Sitting, Cuff Size: Large)   Pulse 69   Temp 98 F (36.7 C) (Oral)   Ht 5' (1.524 m)   Wt 227 lb 3.2 oz (103.1 kg)   SpO2 97%   BMI 44.37 kg/m  Physical Exam Constitutional:      Appearance: Normal appearance. She is not ill-appearing.  HENT:  Head: Normocephalic and atraumatic.  Cardiovascular:     Rate and Rhythm: Normal rate and regular rhythm.     Comments: +2 pedal edema Pulmonary:     Effort: Pulmonary effort is normal.     Breath sounds: Normal breath sounds. No stridor. No rhonchi or rales.     Comments: CTA; upper airway wheeze Musculoskeletal:        General: Normal range of motion.  Skin:    General: Skin is warm and dry.  Neurological:     General: No focal deficit present.     Mental Status: She is alert and oriented to person, place, and time. Mental status is at baseline.  Psychiatric:        Mood and Affect: Mood normal.        Behavior: Behavior normal.        Thought Content: Thought content normal.        Judgment: Judgment normal.      Lab Results:  CBC    Component Value Date/Time   WBC 4.4 10/30/2020 1028   RBC 5.52 (H) 10/30/2020 1028   HGB 14.1 10/30/2020 1028   HGB 15.0 01/31/2007 1028   HCT 44.3 10/30/2020 1028   HCT 44.6 01/31/2007 1028   PLT 197.0 10/30/2020 1028   PLT 214 01/31/2007 1028   MCV 80.3 10/30/2020 1028   MCV 80.1 (L) 01/31/2007 1028   MCH 25.6 (L) 05/31/2020 1130   MCHC 31.9 10/30/2020 1028   RDW 15.9 (H) 10/30/2020 1028   RDW 15.7 (H) 01/31/2007 1028   LYMPHSABS 0.9 10/30/2020 1028   LYMPHSABS 1.0  01/31/2007 1028   MONOABS 0.7 10/30/2020 1028   MONOABS 0.5 01/31/2007 1028   EOSABS 0.1 10/30/2020 1028   EOSABS 0.2 01/31/2007 1028   BASOSABS 0.0 10/30/2020 1028   BASOSABS 0.0 01/31/2007 1028    BMET    Component Value Date/Time   NA 143 11/21/2021 1518   K 4.3 11/21/2021 1518   CL 102 11/21/2021 1518   CO2 23 11/21/2021 1518   GLUCOSE 86 11/21/2021 1518   GLUCOSE 123 (H) 05/31/2020 1130   BUN 19 11/21/2021 1518   CREATININE 1.17 (H) 11/21/2021 1518   CALCIUM 9.2 11/21/2021 1518   GFRNONAA 55 (L) 05/31/2020 1130   GFRAA >60 09/23/2016 1950    BNP No results found for: "BNP"  ProBNP    Component Value Date/Time   PROBNP 68 03/01/2019 1433    Imaging: DG Chest 2 View  Result Date: 03/11/2022 CLINICAL DATA:  Dyspnea, cough EXAM: CHEST - 2 VIEW COMPARISON:  02/05/2022 FINDINGS: Paucity of vasculature within the lung apices is in keeping with changes of underlying bullous emphysema better appreciated on CT examination of 09/23/2016. Mild left basilar atelectasis. No pneumothorax or pleural effusion. Cardiac size within normal limits. Pulmonary vascularity is normal. Osseous structures are age-appropriate. IMPRESSION: Mild left basilar atelectasis. Emphysema Electronically Signed   By: Fidela Salisbury M.D.   On: 03/11/2022 03:14     Assessment & Plan:   Sinusitis, acute maxillary - Patient was seen in August for acute sinusitis symptoms, treated with Z-Pak and prednisone 40mg  x 5 days.  Her symptoms remain persistent.  She complains of head congestion with bloody mucus.  She is using saline nasal rinses at bedtime. Advised she start mucinex 1200mg  BID x 5-7 days and fluticasone nasal spray 1 puff per nostril once daily.  Recommend she add humidification to bedroom at night while sleeping.  We will send an additional prednisone taper.  If symptoms do not improve check sputum culture and consider CT sinuses.   COPD mixed type ([place]) - She did not tolerate Breztri due to muscle  cramps. Recommend resuming Stiolto Respimat 2 puffs once daily  Left lower lobe pneumonia - CXR in August showed minimal opacity left lower lobe. She was already being treated at that time for sinusitis with Zpack. Lungs were clear on exam today. Needs repeat chest imaging.    Martyn Ehrich, NP 03/31/2022

## 2022-03-31 NOTE — Assessment & Plan Note (Addendum)
-   CXR in August showed minimal opacity left lower lobe. She was already being treated at that time for sinusitis with Zpack. Lungs were clear on exam today. Needs repeat chest imaging.

## 2022-03-31 NOTE — Addendum Note (Signed)
Addended by: Irine Seal B on: 03/31/2022 03:06 PM   Modules accepted: Orders

## 2022-03-31 NOTE — Assessment & Plan Note (Addendum)
-   Patient was seen in August for acute sinusitis symptoms, treated with Z-Pak and prednisone 40mg  x 5 days.  Her symptoms remain persistent.  She complains of head congestion with bloody mucus.  She is using saline nasal rinses at bedtime. Advised she start mucinex 1200mg  BID x 5-7 days and fluticasone nasal spray 1 puff per nostril once daily.  Recommend she add humidification to bedroom at night while sleeping.  We will send an additional prednisone taper. If symptoms do not improve check sputum culture and consider CT sinuses.

## 2022-04-01 NOTE — Progress Notes (Signed)
Please let patient know CXR looked fine. Labs showed kidney function was slightly elevated likely from dehydration but can be d/t kidney issues. Should have repeat BMET in 1-2 weeks.

## 2022-04-02 ENCOUNTER — Other Ambulatory Visit: Payer: Self-pay | Admitting: *Deleted

## 2022-04-02 DIAGNOSIS — J0100 Acute maxillary sinusitis, unspecified: Secondary | ICD-10-CM

## 2022-04-02 DIAGNOSIS — R0602 Shortness of breath: Secondary | ICD-10-CM

## 2022-04-02 DIAGNOSIS — J189 Pneumonia, unspecified organism: Secondary | ICD-10-CM

## 2022-04-02 DIAGNOSIS — M7989 Other specified soft tissue disorders: Secondary | ICD-10-CM

## 2022-04-14 ENCOUNTER — Telehealth: Payer: Self-pay | Admitting: Primary Care

## 2022-04-14 ENCOUNTER — Other Ambulatory Visit: Payer: Self-pay | Admitting: Infectious Diseases

## 2022-04-14 ENCOUNTER — Ambulatory Visit
Admission: RE | Admit: 2022-04-14 | Discharge: 2022-04-14 | Disposition: A | Discharge status: Home / Self Care | Payer: Medicare Other | Source: Ambulatory Visit | Attending: Infectious Diseases | Admitting: Infectious Diseases

## 2022-04-14 DIAGNOSIS — M545 Low back pain, unspecified: Secondary | ICD-10-CM

## 2022-04-14 MED ORDER — STIOLTO RESPIMAT 2.5-2.5 MCG/ACT IN AERS: 2.0000 | INHALATION_SPRAY | Freq: Every day | RESPIRATORY_TRACT | 4 refills | Status: DC

## 2022-04-14 NOTE — Telephone Encounter (Signed)
Spoke with pt and advised that rx for Stiolto was sent to pharmacy. Nothing further needed at this time.

## 2022-04-19 HISTORY — PX: CATARACT EXTRACTION: SUR2

## 2022-05-12 ENCOUNTER — Ambulatory Visit: Payer: Medicare Other | Admitting: Cardiology

## 2022-05-12 ENCOUNTER — Encounter: Payer: Self-pay | Admitting: Cardiology

## 2022-05-12 VITALS — BP 127/80 | HR 84 | Temp 98.2°F | Resp 16 | Ht 60.0 in | Wt 232.0 lb

## 2022-05-12 DIAGNOSIS — M7989 Other specified soft tissue disorders: Secondary | ICD-10-CM

## 2022-05-12 DIAGNOSIS — Z85118 Personal history of other malignant neoplasm of bronchus and lung: Secondary | ICD-10-CM

## 2022-05-12 DIAGNOSIS — E782 Mixed hyperlipidemia: Secondary | ICD-10-CM

## 2022-05-12 DIAGNOSIS — I10 Essential (primary) hypertension: Secondary | ICD-10-CM

## 2022-05-12 NOTE — Progress Notes (Signed)
ID:  [name], DOB 07-02-1946, MRN [id number]  PCP:  Antony Blackbird, MD  Cardiologist:  Rex Kras, DO, [facility] (established care 11/11/2021) Former Cardiology Providers: Dr. Adrian Prows  Date: 05/12/22 Last Office Visit: 12/12/2021  Chief Complaint  Patient presents with   Edema   Follow-up    HPI  [name] is a 76 y.o. African-American female whose past medical history and cardiovascular risk factors include: Prediabetes, hypertension, hyperlipidemia, obesity due to excess calories, history of gastric banding/gastric bypass surgery  Initially referred to the practice back in April 2023 for evaluation of precordial pain.  She has undergone ischemic work-up as outlined above and also started on lipid therapy given her index LDL of approximately 140 mg/dL.  Patient presents today for a sick visit due to bilateral lower extremity swelling.  Symptoms started approximately 6 weeks ago, denies immobilization/surgery/injury.  Over the last 6 weeks both swelling and redness has improved.  But she still has pain with plantarflexion.  She was prescribed Lasix by PCP but unable to take it twice a day due to increased urination.  She is gained approximately 12 pounds since her last office visit in May 2023.  In September 2023 she was given antibiotics/steroids for URI and thereafter another course of antibiotics for UTI.  See scheduled for arthroplasty C3-C4 with Dr. Inda Merlin at [facility] on 06/05/2022.  FUNCTIONAL STATUS: No structured exercise program or daily routine.  ALLERGIES: No Known Allergies  MEDICATION LIST PRIOR TO VISIT: Current Meds  Medication Sig   acetaminophen (TYLENOL) 650 MG CR tablet Take 1,300 mg by mouth every 8 (eight) hours as needed for pain.   albuterol (PROVENTIL) (2.5 MG/3ML) 0.083% nebulizer solution USE 1 VIAL IN NEBULIZER EVERY 8 HOURS AS NEEDED FOR WHEEZING OR SHORTNESS OF BREATH   albuterol (VENTOLIN HFA) 108 (90 Base) MCG/ACT  inhaler TAKE 2 PUFFS BY MOUTH EVERY 6 HOURS AS NEEDED FOR WHEEZE OR SHORTNESS OF BREATH   aspirin EC 81 MG tablet Take 1 tablet (81 mg total) by mouth daily. Swallow whole.   cetirizine (ZYRTEC) 10 MG tablet Take 10 mg by mouth daily.   chlorpheniramine-HYDROcodone (TUSSIONEX PENNKINETIC ER) 10-8 MG/5ML Take 5 mLs by mouth every 12 (twelve) hours as needed for cough.   diphenhydrAMINE (BENADRYL) 25 MG tablet 1 tablet at bedtime as needed   FREESTYLE INSULINX TEST test strip AS DIRECTED TO OBTAIN BLOOD TWICE A DAY AS NEEDED DX CODE E11.69 90 DAYS   furosemide (LASIX) 20 MG tablet Take 20 mg by mouth every other day.   gabapentin (NEURONTIN) 100 MG capsule Take 200 mg by mouth 3 (three) times daily.   glucose 4 GM chewable tablet Chew 20-24 g by mouth as needed for low blood sugar.   ketoconazole (NIZORAL) 2 % cream Apply 1 application topically daily. (Patient taking differently: Apply 1 application  topically daily as needed.)   Lancets (FREESTYLE) lancets    Multiple Vitamin (MULTIVITAMIN) tablet Take 1 tablet by mouth daily.   Nebulizers (COMPRESSOR/NEBULIZER) MISC Use as directed   nitroGLYCERIN (NITROSTAT) 0.4 MG SL tablet Place 1 tablet (0.4 mg total) under the tongue every 5 (five) minutes as needed for chest pain. If you require more than two tablets five minutes apart go to the nearest ER via EMS.   olmesartan-hydrochlorothiazide (BENICAR HCT) 20-12.5 MG tablet TAKE 1 TABLET BY MOUTH EVERY DAY (Patient taking differently: Take 1 tablet by mouth every other day.)   rosuvastatin (CRESTOR) 10 MG tablet Take 1 tablet (10  mg total) by mouth at bedtime.   Tiotropium Bromide-Olodaterol (STIOLTO RESPIMAT) 2.5-2.5 MCG/ACT AERS Inhale 2 puffs into the lungs daily.   tiZANidine (ZANAFLEX) 4 MG tablet Take 4 mg by mouth 2 (two) times daily as needed.     PAST MEDICAL HISTORY: Past Medical History:  Diagnosis Date   Abdominal pain    around naval   Arthritis    Breast pain in female    Bruises  easily    Cancer ([place]) 2003   lung   Cervical disc disease    COPD (chronic obstructive pulmonary disease) ([place])    O2 as needed.   DJD (degenerative joint disease)    Dyspnea    Gum disease    Hernia    Hypoglycemic episode in patient with diabetes mellitus ([place]) 2021   Diet controlled diabetes with occasional hypoglycemia at night   Hypothyroidism    no meds   Insomnia    On home O2    as needed   Phlebitis    Pre-diabetes    Thyroid disease    Wears glasses    Weight increase     PAST SURGICAL HISTORY: Past Surgical History:  Procedure Laterality Date   ABDOMINAL HYSTERECTOMY     partial   CATARACT EXTRACTION     FOOT SURGERY     right   LAPAROSCOPIC GASTRIC BANDING  08/2008   LAPAROSCOPIC ROUX-EN-Y GASTRIC BYPASS WITH UPPER ENDOSCOPY AND REMOVAL OF LAP BAND  2018   LEFT HEART CATHETERIZATION WITH CORONARY ANGIOGRAM N/A 05/02/2012   Procedure: LEFT HEART CATHETERIZATION WITH CORONARY ANGIOGRAM;  Surgeon: Laverda Page, MD;  Location: [facility] CATH LAB;  Service: Cardiovascular;  Laterality: N/A;   LUNG LOBECTOMY  2003   left   MASS EXCISION Right 06/06/2020   Procedure: REMOVAL MASS ON RIGHT BUTTOCK;  Surgeon: Michael Boston, MD;  Location: [facility] ORS;  Service: General;  Laterality: Right;   SPINE SURGERY     fusion on the neck   THYROIDECTOMY     partial   TONSILLECTOMY     VENTRAL HERNIA REPAIR N/A 06/06/2020   Procedure: LAPAROSCOPIC INCARCERATED INCISIONAL HERNIA REPAIR WITH MESH AND TAP BLOCK;  Surgeon: Michael Boston, MD;  Location: [facility] ORS;  Service: General;  Laterality: N/A;    FAMILY HISTORY: The patient family history includes Stroke in her father and mother.  SOCIAL HISTORY:  The patient  reports that she quit smoking about 19 years ago. Her smoking use included cigarettes. She has a 74.00 pack-year smoking history. She has never used smokeless tobacco. She reports current alcohol use. She reports that she does not use drugs.  REVIEW OF SYSTEMS: Review  of Systems  Cardiovascular:  Positive for dyspnea on exertion (chronic and stable) and leg swelling. Negative for chest pain, palpitations and syncope.       Leg pain -not consistent with claudication  Respiratory:  Positive for shortness of breath (Chronic and stable).   Musculoskeletal:  Positive for arthritis, back pain and joint pain.    PHYSICAL EXAM:    05/12/2022    2:47 PM 03/31/2022    2:00 PM 03/24/2022    1:06 PM  Vitals with BMI  Height _0  _1  _2   Weight 232 lbs 227 lbs 3 oz 230 lbs  BMI 45.31 03.00 92.33  Systolic 007 622 633  Diastolic 80 68 69  Pulse 84 69 72    CONSTITUTIONAL: Well-developed and well-nourished. No acute distress.  Walks with a walker and supplemental oxygen  SKIN: Skin is warm and dry. No rash noted. No cyanosis. No pallor. No jaundice HEAD: Normocephalic and atraumatic.  EYES: No scleral icterus MOUTH/THROAT: Moist oral membranes.  NECK: No JVD present. No thyromegaly noted. No carotid bruits  CHEST Normal respiratory effort. No intercostal retractions  LUNGS: Clear to auscultation bilaterally.  No stridor. No wheezes. No rales.  CARDIOVASCULAR: Regular rate and rhythm, positive S1-S2, no murmurs rubs or gallops appreciated. ABDOMINAL: Obese, soft, nontender, nondistended, positive bowel sounds all 4 quadrants no apparent ascites.  EXTREMITIES: + Bilateral peripheral edema left greater than right, skin is taut & shiny, warm to touch, 1+ bilateral DP and PT pulses HEMATOLOGIC: No significant bruising NEUROLOGIC: Oriented to person, place, and time. Nonfocal. Normal muscle tone.  PSYCHIATRIC: Normal mood and affect. Normal behavior. Cooperative  CARDIAC DATABASE: EKG: 04/29/2012: Normal sinus rhythm, 62 bpm, left axis, ST-T changes inferolateral and anterior leads concerning for possible ischemia.  October 29, 2021 provided by referring physician: Sinus bradycardia, 56 bpm, left axis, left anterior fascicular block, T wave inversions in  inferolateral leads suggestive of possible ischemia.  November 11, 2021: Normal sinus rhythm, 62 bpm, left axis, left anterior fascicular block, consider old anteroseptal infarct, TWI consider anterior and inferolateral ischemia.  Similar findings on prior ECG.  Echocardiogram: 10/29/2021 [facility]: Per report LVEF 55 to 60%, mild LVH, aortic atherosclerosis, normal left atrial size,  Stress Testing: Lexiscan Tetrofosmin stress test 12/08/2021: Lexiscan nuclear stress test performed using 1-day protocol. Normal myocardial perfusion. Stress LVEF 55%. Low risk study.  Heart Catheterization: 05/02/2012: Right coronary artery: The vessel is smooth, normal, Very small with a very tiny PD branch. It is Co- Dominant. Normal   Left main coronary artery is large and normal.   Circumflex coronary artery: A large vessel giving origin to a large obtuse marginal 1. It is dominant. Normal   LAD:  LAD gives origin to a large diagonal-1. LAD Ends before reaching the apex. Normal   [place] intermediate: It is a very large branch. It is smooth and normal.  LV gram: 55-60%  Lower extremity arterial duplex: 02/25/2018. No hemodynamically significant stenosis within the bilateral lower extremity.  Right ABI 0.93, mildly decreased perfusion.  Left ABI 1.04, normal perfusion.  Coronary calcium score:  12/10/2021  No visible coronary artery calcifications. Total coronary calcium score of 0. No acute extra cardiac abnormality.  LABORATORY DATA: Hemoglobin A1c   2020-03-20    eAG 143      Hgb A1c 6.6   4.8-5.6  BNP   2020-03-20    BNP 50   6-144   Comp Metabolic Panel   [id number]    Albumin 4.1   3.4-4.8  ALP 76   38-126  ALT 12   0-52  Anion Gap 10.4   6.0-20.0  AST 19   0-39  BUN 25   6-26  CO2 31   22-32  CA-corrected 9.61   8.60-10.30  Calcium 9.7   8.6-10.3  Chloride 104   98-107  Creatinine 1.05   0.60-1.30  AAeGFR 62   >60  eGFR 51   >60  Glucose 100   70-99   Potassium 4.6   3.5-5.5  Sodium 141   136-145   External Labs: Collected: 11/14/2020. Total cholesterol 204, triglycerides 93, HDL 59, LDL 128. Hemoglobin A1c 6.4%  Collected: October 29, 2021 provided by the patient. Hemoglobin 15.1 g/dL, hematocrit 48.1%. BUN 20, creatinine 1.15. eGFR 49. Sodium 145, potassium 4.8, chloride 105, bicarb 25, AST 27,  ALT 16. Total cholesterol 224, triglycerides 114, HDL 69, LDL 140. A1c 6.3   IMPRESSION:    ICD-10-CM   1. Leg swelling  M79.89 CMP14+EGFR    D-dimer, quantitative    Pro b natriuretic peptide (BNP)    PCV LOWER VENOUS US (BILATERAL)    2. Benign hypertension  I10 PCV ECHOCARDIOGRAM COMPLETE    3. Mixed hyperlipidemia  E78.2     4. Hx of cancer of lung  Z85.118        RECOMMENDATIONS: Sharian A Fury is a 76 y.o. African-American female whose past medical history and cardiac risk factors include: Prediabetes, hypertension, hyperlipidemia, obesity due to excess calories, history of gastric banding/gastric bypass surgery, hx of lung cancer.   Leg swelling Ongoing for the last 6 weeks. Patient states that they have improved, and is less erythematous. Still pain with plantarflexion bilaterally. On physical examination left lower extremity is larger than the right lower extremity. Concern for possible deep venous thrombosis.  We will arrange for lower extremity venous duplex. Check D-dimer. If she develops worsening shortness of breath she is asked to go to the closest ER via EMS to rule out pulmonary embolism.  Currently she is nontachycardic, nontachypneic, symptoms ongoing for the last 6 weeks and with slight improvement.  Benign hypertension Office blood pressures are well controlled. Medications reconciled. Echo will be ordered to evaluate for structural heart disease and left ventricular systolic function. Check BNP  Mixed hyperlipidemia Currently on rosuvastatin.   Indexed LDL approximately 140 mg/dL. She  denies myalgia or other side effects.  Hx of cancer of lung Status post lung cancer survivor. Status post lobectomy and oxygen dependence with activity. Follows up with pulmonary medicine.  Patient was worked in for a sick visit.  Managed at least 1 acute and 2 chronic comorbid conditions.  Discussed the possibility of venous thromboembolic event.  Diagnostic testing scheduled.  Further recommendations to follow.  FINAL MEDICATION LIST END OF ENCOUNTER: No orders of the defined types were placed in this encounter.   Medications Discontinued During This Encounter  Medication Reason   Magnesium 400 MG TABS    Melatonin 10 MG CAPS    Polyethyl Glycol-Propyl Glycol (SYSTANE OP)    predniSONE (DELTASONE) 10 MG tablet    triamcinolone cream (KENALOG) 0.1 %      Current Outpatient Medications:    acetaminophen (TYLENOL) 650 MG CR tablet, Take 1,300 mg by mouth every 8 (eight) hours as needed for pain., Disp: , Rfl:    albuterol (PROVENTIL) (2.5 MG/3ML) 0.083% nebulizer solution, USE 1 VIAL IN NEBULIZER EVERY 8 HOURS AS NEEDED FOR WHEEZING OR SHORTNESS OF BREATH, Disp: 375 mL, Rfl: 11   albuterol (VENTOLIN HFA) 108 (90 Base) MCG/ACT inhaler, TAKE 2 PUFFS BY MOUTH EVERY 6 HOURS AS NEEDED FOR WHEEZE OR SHORTNESS OF BREATH, Disp: 18 each, Rfl: 12   aspirin EC 81 MG tablet, Take 1 tablet (81 mg total) by mouth daily. Swallow whole., Disp: 30 tablet, Rfl: 11   cetirizine (ZYRTEC) 10 MG tablet, Take 10 mg by mouth daily., Disp: , Rfl:    chlorpheniramine-HYDROcodone (TUSSIONEX PENNKINETIC ER) 10-8 MG/5ML, Take 5 mLs by mouth every 12 (twelve) hours as needed for cough., Disp: 115 mL, Rfl: 0   diphenhydrAMINE (BENADRYL) 25 MG tablet, 1 tablet at bedtime as needed, Disp: , Rfl:    FREESTYLE INSULINX TEST test strip, AS DIRECTED TO OBTAIN BLOOD TWICE A DAY AS NEEDED DX CODE E11.69 90 DAYS, Disp: , Rfl:  furosemide (LASIX) 20 MG tablet, Take 20 mg by mouth every other day., Disp: , Rfl:    gabapentin  (NEURONTIN) 100 MG capsule, Take 200 mg by mouth 3 (three) times daily., Disp: , Rfl:    glucose 4 GM chewable tablet, Chew 20-24 g by mouth as needed for low blood sugar., Disp: , Rfl:    ketoconazole (NIZORAL) 2 % cream, Apply 1 application topically daily. (Patient taking differently: Apply 1 application  topically daily as needed.), Disp: 60 g, Rfl: 2   Lancets (FREESTYLE) lancets, , Disp: , Rfl:    Multiple Vitamin (MULTIVITAMIN) tablet, Take 1 tablet by mouth daily., Disp: , Rfl:    Nebulizers (COMPRESSOR/NEBULIZER) MISC, Use as directed, Disp: 1 each, Rfl: prn   nitroGLYCERIN (NITROSTAT) 0.4 MG SL tablet, Place 1 tablet (0.4 mg total) under the tongue every 5 (five) minutes as needed for chest pain. If you require more than two tablets five minutes apart go to the nearest ER via EMS., Disp: 30 tablet, Rfl: 0   olmesartan-hydrochlorothiazide (BENICAR HCT) 20-12.5 MG tablet, TAKE 1 TABLET BY MOUTH EVERY DAY (Patient taking differently: Take 1 tablet by mouth every other day.), Disp: 30 tablet, Rfl: 0   rosuvastatin (CRESTOR) 10 MG tablet, Take 1 tablet (10 mg total) by mouth at bedtime., Disp: 90 tablet, Rfl: 0   Tiotropium Bromide-Olodaterol (STIOLTO RESPIMAT) 2.5-2.5 MCG/ACT AERS, Inhale 2 puffs into the lungs daily., Disp: 4 g, Rfl: 4   tiZANidine (ZANAFLEX) 4 MG tablet, Take 4 mg by mouth 2 (two) times daily as needed., Disp: , Rfl:   Orders Placed This Encounter  Procedures   CMP14+EGFR   D-dimer, quantitative   Pro b natriuretic peptide (BNP)   PCV ECHOCARDIOGRAM COMPLETE   PCV LOWER VENOUS US (BILATERAL)    There are no Patient Instructions on file for this visit.   --Continue cardiac medications as reconciled in final medication list. --Return in about 8 weeks (around 07/07/2022) for Follow up, Dyspnea, post-op . Or sooner if needed. --Continue follow-up with your primary care physician regarding the management of your other chronic comorbid conditions.  Patient's questions  and concerns were addressed to her satisfaction. She voices understanding of the instructions provided during this encounter.   This note was created using a voice recognition software as a result there may be grammatical errors inadvertently enclosed that do not reflect the nature of this encounter. Every attempt is made to correct such errors.  Rex Kras, [place], [facility]  Pager: [phone] Office: [phone]

## 2022-05-13 ENCOUNTER — Ambulatory Visit: Payer: Medicare Other

## 2022-05-13 ENCOUNTER — Encounter: Payer: Self-pay | Admitting: Internal Medicine

## 2022-05-13 ENCOUNTER — Other Ambulatory Visit: Payer: Self-pay | Admitting: Cardiology

## 2022-05-13 DIAGNOSIS — M7989 Other specified soft tissue disorders: Secondary | ICD-10-CM

## 2022-05-13 DIAGNOSIS — R7989 Other specified abnormal findings of blood chemistry: Secondary | ICD-10-CM

## 2022-05-13 DIAGNOSIS — I10 Essential (primary) hypertension: Secondary | ICD-10-CM

## 2022-05-13 LAB — CMP14+EGFR
ALT: 16 IU/L (ref 0–32)
AST: 26 IU/L (ref 0–40)
Albumin/Globulin Ratio: 1.9 (ref 1.2–2.2)
Albumin: 4.7 g/dL (ref 3.8–4.8)
Alkaline Phosphatase: 77 IU/L (ref 44–121)
BUN/Creatinine Ratio: 13 (ref 12–28)
BUN: 17 mg/dL (ref 8–27)
Bilirubin Total: 0.3 mg/dL (ref 0.0–1.2)
CO2: 26 mmol/L (ref 20–29)
Calcium: 9.6 mg/dL (ref 8.7–10.3)
Chloride: 103 mmol/L (ref 96–106)
Creatinine, Ser: 1.27 mg/dL — ABNORMAL HIGH (ref 0.57–1.00)
Globulin, Total: 2.5 g/dL (ref 1.5–4.5)
Glucose: 76 mg/dL (ref 70–99)
Potassium: 4.7 mmol/L (ref 3.5–5.2)
Sodium: 145 mmol/L — ABNORMAL HIGH (ref 134–144)
Total Protein: 7.2 g/dL (ref 6.0–8.5)
eGFR: 44 mL/min/{1.73_m2} — ABNORMAL LOW (ref >59)

## 2022-05-13 LAB — PRO B NATRIURETIC PEPTIDE: NT-Pro BNP: 61 pg/mL (ref 0–738)

## 2022-05-13 LAB — D-DIMER, QUANTITATIVE: D-DIMER: 1.15 mg/L FEU — ABNORMAL HIGH (ref 0.00–0.49)

## 2022-05-13 NOTE — Progress Notes (Unsigned)
Received records from [facility] on the patient's prior colonoscopies. Will have these records scanned into the system.  Colonoscopy 05/22/16: 4 mm polyp in the sigmoid colon that was resected with hot snare. Multiple small and large mouthed diverticula. Internal hemorrhoids. Fair prep.  Path: Tubular adenoma Rec: 5 year follow up colonoscopy  Colonoscopy 11/20/19: Segmental area of mild congested mucosa was found at the IC valve that was biopsied. Multiple small and large diverticula were found in the sigmoid colon. Internal hemorrhoids. Fair colon prep. Path: Ileocolonic valve mucosa with no significant pathologic changes. Negative for lymphocytic/collagenous ileocolitis. Rec: No return colonoscopy recommended.

## 2022-05-14 ENCOUNTER — Ambulatory Visit
Admission: RE | Admit: 2022-05-14 | Discharge: 2022-05-14 | Disposition: A | Discharge status: Home / Self Care | Payer: Medicare Other | Source: Ambulatory Visit | Attending: Cardiology | Admitting: Cardiology

## 2022-05-14 DIAGNOSIS — R7989 Other specified abnormal findings of blood chemistry: Secondary | ICD-10-CM

## 2022-05-14 MED ORDER — IOPAMIDOL (ISOVUE-370) INJECTION 76%
75.0000 mL | Freq: Once | INTRAVENOUS | Status: AC | PRN
  Administered 2022-05-14: 75 mL via INTRAVENOUS

## 2022-05-14 NOTE — Progress Notes (Signed)
Patient was made aware and she had the CT done this morning

## 2022-05-18 ENCOUNTER — Ambulatory Visit (HOSPITAL_COMMUNITY): Payer: Medicare Other

## 2022-05-21 ENCOUNTER — Ambulatory Visit: Payer: Medicare Other | Admitting: Surgery

## 2022-05-21 ENCOUNTER — Encounter: Payer: Self-pay | Admitting: Surgery

## 2022-05-21 VITALS — BP 113/74 | HR 73 | Ht 60.0 in | Wt 232.0 lb

## 2022-05-21 DIAGNOSIS — M4802 Spinal stenosis, cervical region: Secondary | ICD-10-CM

## 2022-05-21 NOTE — Progress Notes (Signed)
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  States that neck symptoms unchanged from previous visit.  She is wanting to proceed with C3-4 ACDF is scheduled.  Today history and physical performed.  We had previous cardiac clearance from June 2023 but patient states that she recently was seen by cardiologist about a week ago for evaluation.  We do not yet have updated clearance.  Surgery scheduler had also requested pulmonary clearance from Dr. Annamaria Boots but we do not have this yet.  Review of systems positive for dysphagia with solids and liquids.  Patient states that she has had this for years.  She was supposed to see a GI specialist at the request of her pulmonologist but she has not done so yet.  States that food and water gets stuck up in her throat.     Plan We are waiting clearances from cardiology and pulmonary.  I will see if Dr. Lorin Mercy thinks that patient needs to be evaluated by ENT or GI before her surgery.  Patient understands that from cervical fusion alone that we could cause some dysphagia but her symptoms may likely be worsened since she already has that problem.  In I will get Dr. Narda Amber view on this.  Patient advised.

## 2022-05-25 ENCOUNTER — Encounter: Payer: Self-pay | Admitting: Cardiology

## 2022-05-26 ENCOUNTER — Telehealth: Payer: Self-pay | Admitting: Primary Care

## 2022-05-26 NOTE — Telephone Encounter (Signed)
Fax received from Dr. Rodell Perna with [facility] to perform a Cervical Fusion on patient.  Patient needs surgery clearance. Surgery is PENDING. Patient was seen on 03/31/2022. Office protocol is a risk assessment can be sent to surgeon if patient has been seen in 60 days or less.   Sending to Derl Barrow for risk assessment or recommendations if patient needs to be seen in office prior to surgical procedure.

## 2022-05-26 NOTE — Telephone Encounter (Signed)
Can we try to call this patient and get her seen for surgical clearance follow up.  If she wants to wait till her January visit that is her choice.   Thank you

## 2022-05-26 NOTE — Telephone Encounter (Signed)
Needs visit

## 2022-05-27 ENCOUNTER — Encounter: Payer: Self-pay | Admitting: Podiatrist

## 2022-05-27 ENCOUNTER — Ambulatory Visit: Payer: Medicare Other | Admitting: Podiatrist

## 2022-05-27 DIAGNOSIS — B351 Tinea unguium: Secondary | ICD-10-CM | POA: Diagnosis not present

## 2022-05-27 DIAGNOSIS — M79676 Pain in unspecified toe(s): Secondary | ICD-10-CM

## 2022-05-27 DIAGNOSIS — E1151 Type 2 diabetes mellitus with diabetic peripheral angiopathy without gangrene: Secondary | ICD-10-CM | POA: Diagnosis not present

## 2022-05-27 NOTE — Pre-Procedure Instructions (Signed)
Surgical Instructions    Your procedure is scheduled on Friday, November 17.  Report to [facility] Main Entrance "A" at 7:37 A.M., then check in with the Admitting office.  Call this number if you have problems the morning of surgery:  [phone]   If you have any questions prior to your surgery date call [phone]: Open Monday-Friday 8am-4pm If you experience any cold or flu symptoms such as cough, fever, chills, shortness of breath, etc. between now and your scheduled surgery, please notify us at the above number     Remember:  Do not eat after midnight the night before your surgery  You may drink clear liquids until 6:37AM the morning of your surgery.   Clear liquids allowed are: Water, Non-Citrus Juices (without pulp), Carbonated Beverages, Clear Tea, Black Coffee ONLY (NO MILK, CREAM OR POWDERED CREAMER of any kind), and Gatorade    Take these medicines the morning of surgery with A SIP OF WATER:  cetirizine (ZYRTEC)  gabapentin (NEURONTIN)  Tiotropium Bromide-Olodaterol (STIOLTO RESPIMAT) 2.5-2.5 MCG/ACT AERS  tiZANidine (ZANAFLEX)  if needed acetaminophen (TYLENOL)  if needed albuterol (PROVENTIL) (2.5 MG/3ML) 0.083% nebulizer solution  if needed albuterol (VENTOLIN HFA) 108 (90 Base) MCG/ACT inhaler  if needed- Please bring all inhalers with you the day of surgery.   Follow your surgeon's instructions on when to stop Aspirin.  If no instructions were given by your surgeon then you will need to call the office to get those instructions.    As of today, STOP taking any  Aleve, Naproxen, Ibuprofen, Motrin, Advil, Goody's, BC's, all herbal medications, fish oil, and all vitamins.    WHAT DO I DO ABOUT MY DIABETES MEDICATION?   Do not take oral diabetes medicines (pills) the morning of surgery.   The day of surgery, do not take other diabetes injectables, including Byetta (exenatide), Bydureon (exenatide ER), Victoza (liraglutide), or Trulicity (dulaglutide).  If your  CBG is greater than 220 mg/dL, you may take  of your sliding scale (correction) dose of insulin.   HOW TO MANAGE YOUR DIABETES BEFORE AND AFTER SURGERY  Why is it important to control my blood sugar before and after surgery? Improving blood sugar levels before and after surgery helps healing and can limit problems. A way of improving blood sugar control is eating a healthy diet by:  Eating less sugar and carbohydrates  Increasing activity/exercise  Talking with your doctor about reaching your blood sugar goals High blood sugars (greater than 180 mg/dL) can raise your risk of infections and slow your recovery, so you will need to focus on controlling your diabetes during the weeks before surgery. Make sure that the doctor who takes care of your diabetes knows about your planned surgery including the date and location.  How do I manage my blood sugar before surgery? Check your blood sugar at least 4 times a day, starting 2 days before surgery, to make sure that the level is not too high or low.  Check your blood sugar the morning of your surgery when you wake up and every 2 hours until you get to the Short Stay unit.  If your blood sugar is less than 70 mg/dL, you will need to treat for low blood sugar: Do not take insulin. Treat a low blood sugar (less than 70 mg/dL) with  cup of clear juice (cranberry or apple), 4 glucose tablets, OR glucose gel. Recheck blood sugar in 15 minutes after treatment (to make sure it is greater than 70 mg/dL). If  your blood sugar is not greater than 70 mg/dL on recheck, call [phone] for further instructions. Report your blood sugar to the short stay nurse when you get to Short Stay.  If you are admitted to the hospital after surgery: Your blood sugar will be checked by the staff and you will probably be given insulin after surgery (instead of oral diabetes medicines) to make sure you have good blood sugar levels. The goal for blood sugar control after  surgery is 80-180 mg/dL.  [facility] is not responsible for any belongings or valuables.    Do NOT Smoke (Tobacco/Vaping)  24 hours prior to your procedure  If you use a CPAP at night, you may bring your mask for your overnight stay.   Contacts, glasses, hearing aids, dentures or partials may not be worn into surgery, please bring cases for these belongings   For patients admitted to the hospital, discharge time will be determined by your treatment team.   Patients discharged the day of surgery will not be allowed to drive home, and someone needs to stay with them for 24 hours.   SURGICAL WAITING ROOM VISITATION Patients having surgery or a procedure may have no more than 2 support people in the waiting area - these visitors may rotate.   Children under the age of 65 must have an adult with them who is not the patient. If the patient needs to stay at the hospital during part of their recovery, the visitor guidelines for inpatient rooms apply. Pre-op nurse will coordinate an appropriate time for 1 support person to accompany patient in pre-op.  This support person may not rotate.   Please refer to [email or web address] for the visitor guidelines for Inpatients (after your surgery is over and you are in a regular room).    Special instructions:    Oral Hygiene is also important to reduce your risk of infection.  Remember - BRUSH YOUR TEETH THE MORNING OF SURGERY WITH YOUR REGULAR TOOTHPASTE   [facility]- Preparing For Surgery  Before surgery, you can play an important role. Because skin is not sterile, your skin needs to be as free of germs as possible. You can reduce the number of germs on your skin by washing with CHG (chlorahexidine gluconate) Soap before surgery.  CHG is an antiseptic cleaner which kills germs and bonds with the skin to continue killing germs even after washing.     Please do not use if you have an allergy to CHG  or antibacterial soaps. If your skin becomes reddened/irritated stop using the CHG.  Do not shave (including legs and underarms) for at least 48 hours prior to first CHG shower. It is OK to shave your face.  Please follow these instructions carefully.     Shower the NIGHT BEFORE SURGERY and the MORNING OF SURGERY with CHG Soap.   If you chose to wash your hair, wash your hair first as usual with your normal shampoo. After you shampoo, rinse your hair and body thoroughly to remove the shampoo.  Then [employer] and genitals (private parts) with your normal soap and rinse thoroughly to remove soap.  After that Use CHG Soap as you would any other liquid soap. You can apply CHG directly to the skin and wash gently with a scrungie or a clean washcloth.   Apply the CHG Soap to your body ONLY FROM THE NECK DOWN.  Do not use on open wounds or open sores. Avoid contact with your eyes, ears, mouth and  genitals (private parts). Wash Face and genitals (private parts)  with your normal soap.   Wash thoroughly, paying special attention to the area where your surgery will be performed.  Thoroughly rinse your body with warm water from the neck down.  DO NOT shower/wash with your normal soap after using and rinsing off the CHG Soap.  Pat yourself dry with a CLEAN TOWEL.  Wear CLEAN PAJAMAS to bed the night before surgery  Place CLEAN SHEETS on your bed the night before your surgery  DO NOT SLEEP WITH PETS.   Day of Surgery:  Take a shower with CHG soap. Wear Clean/Comfortable clothing the morning of surgery   Do not wear jewelry or makeup. Do not wear lotions, powders, perfumes/cologne or deodorant. Do not shave 48 hours prior to surgery.  Men may shave face and neck. Do not bring valuables to the hospital. Do not wear nail polish, gel polish, artificial nails, or any other type of covering on natural nails (fingers and toes) If you have artificial nails or gel coating that need to be removed by a  nail salon, please have this removed prior to surgery. Artificial nails or gel coating may interfere with anesthesia's ability to adequately monitor your vital signs. Remember to brush your teeth WITH YOUR REGULAR TOOTHPASTE.    If you received a COVID test during your pre-op visit, it is requested that you wear a mask when out in public, stay away from anyone that may not be feeling well, and notify your surgeon if you develop symptoms. If you have been in contact with anyone that has tested positive in the last 10 days, please notify your surgeon.    Please read over the following fact sheets that you were given.

## 2022-05-27 NOTE — Progress Notes (Signed)
Subjective: [name] is a 76 y.o. female patient with history of diabetes who presents to office today with a concern of long,mildly painful nails  while ambulating in shoes; unable to trim. She also relates her ankles and feet have been swelling.,  she has seen her cardiologist who has done tests and attribute her swelling to edema.      Her primary care provider is Dr. Chapman Fitch, MD  Objective: General: Patient is awake, alert, and oriented x 3 and in no acute distress.   Integument: Skin is warm, dry and supple bilateral. Nails are tender, long, thickened and  dystrophic with subungual debris with pincer nails noted, consistent with onychomycosis, 1-5 bilateral. No signs of infection. No open lesions or preulcerative lesions present bilateral. Remaining integument unremarkable.   Vasculature:  Dorsalis Pedis pulse 1/4 bilateral. Posterior Tibial pulse  1/4 bilateral.  Capillary fill time <3 sec 1-5 bilateral. Positive hair growth to the level of the digits. Temperature gradient within normal limits. multiple varicosities present bilateral. + edema present bilateral.    Neurology: The patient has intact sensation measured with a 5.07/10g Semmes Weinstein Monofilament at all pedal sites bilateral . Vibratory sensation diminished bilateral with tuning fork. No Babinski sign present bilateral.    Musculoskeletal: No symptomatic pedal deformities noted bilateral. Muscular strength 5/5 in all lower extremity muscular groups bilateral without pain on range of motion . No tenderness with calf compression bilateral.   Assessment and Plan:     ICD-10-CM    1. Type 2 diabetes mellitus with peripheral vascular disease (HCC)  E11.51       2. Pain due to onychomycosis of toenail  B35.1      M79.676             -Examined patient. -Mechanically debrided all nails 1-5 bilateral using sterile nail nipper and filed with dremel without incident  -Answered all patient questions -Patient to  return  in 2-3 months for at risk foot care -Patient advised to call the office if any problems or questions arise in the meantime.   Bronson Ing, DPM

## 2022-05-28 ENCOUNTER — Ambulatory Visit: Payer: Medicare Other | Admitting: Surgery

## 2022-05-28 ENCOUNTER — Encounter (HOSPITAL_COMMUNITY): Payer: Self-pay

## 2022-05-28 ENCOUNTER — Encounter (HOSPITAL_COMMUNITY)
Admission: RE | Admit: 2022-05-28 | Discharge: 2022-05-28 | Disposition: A | Discharge status: Home / Self Care | Payer: Medicare Other | Source: Ambulatory Visit | Attending: Orthopaedic Surgery | Admitting: Orthopaedic Surgery

## 2022-05-28 ENCOUNTER — Other Ambulatory Visit: Payer: Self-pay

## 2022-05-28 ENCOUNTER — Other Ambulatory Visit: Payer: Medicare Other

## 2022-05-28 VITALS — BP 117/73 | HR 65 | Temp 98.0°F | Resp 17 | Ht 60.0 in | Wt 228.6 lb

## 2022-05-28 DIAGNOSIS — Z6841 Body Mass Index (BMI) 40.0 and over, adult: Secondary | ICD-10-CM | POA: Diagnosis not present

## 2022-05-28 DIAGNOSIS — E039 Hypothyroidism, unspecified: Secondary | ICD-10-CM | POA: Insufficient documentation

## 2022-05-28 DIAGNOSIS — F319 Bipolar disorder, unspecified: Secondary | ICD-10-CM | POA: Diagnosis not present

## 2022-05-28 DIAGNOSIS — Z87891 Personal history of nicotine dependence: Secondary | ICD-10-CM | POA: Diagnosis not present

## 2022-05-28 DIAGNOSIS — M4802 Spinal stenosis, cervical region: Secondary | ICD-10-CM | POA: Insufficient documentation

## 2022-05-28 DIAGNOSIS — Z9981 Dependence on supplemental oxygen: Secondary | ICD-10-CM | POA: Insufficient documentation

## 2022-05-28 DIAGNOSIS — Z01812 Encounter for preprocedural laboratory examination: Secondary | ICD-10-CM | POA: Insufficient documentation

## 2022-05-28 DIAGNOSIS — N189 Chronic kidney disease, unspecified: Secondary | ICD-10-CM | POA: Insufficient documentation

## 2022-05-28 DIAGNOSIS — J439 Emphysema, unspecified: Secondary | ICD-10-CM | POA: Diagnosis not present

## 2022-05-28 DIAGNOSIS — E213 Hyperparathyroidism, unspecified: Secondary | ICD-10-CM | POA: Insufficient documentation

## 2022-05-28 DIAGNOSIS — E1122 Type 2 diabetes mellitus with diabetic chronic kidney disease: Secondary | ICD-10-CM | POA: Diagnosis not present

## 2022-05-28 DIAGNOSIS — Z01818 Encounter for other preprocedural examination: Secondary | ICD-10-CM

## 2022-05-28 HISTORY — DX: Depression, unspecified: F32.A

## 2022-05-28 HISTORY — DX: Anxiety disorder, unspecified: F41.9

## 2022-05-28 HISTORY — DX: Bipolar disorder, unspecified: F31.9

## 2022-05-28 HISTORY — DX: Chronic kidney disease, unspecified: N18.9

## 2022-05-28 HISTORY — DX: Dependence on other enabling machines and devices: Z99.89

## 2022-05-28 LAB — CBC
HCT: 49.6 % — ABNORMAL HIGH (ref 36.0–46.0)
Hemoglobin: 15.6 g/dL — ABNORMAL HIGH (ref 12.0–15.0)
MCH: 27.2 pg (ref 26.0–34.0)
MCHC: 31.5 g/dL (ref 30.0–36.0)
MCV: 86.4 fL (ref 80.0–100.0)
Platelets: 187 10*3/uL (ref 150–400)
RBC: 5.74 MIL/uL — ABNORMAL HIGH (ref 3.87–5.11)
RDW: 16 % — ABNORMAL HIGH (ref 11.5–15.5)
WBC: 6 10*3/uL (ref 4.0–10.5)
nRBC: 0 % (ref 0.0–0.2)

## 2022-05-28 LAB — SURGICAL PCR SCREEN
MRSA, PCR: NEGATIVE
Staphylococcus aureus: NEGATIVE

## 2022-05-28 NOTE — Telephone Encounter (Signed)
LMTCB

## 2022-05-28 NOTE — Progress Notes (Signed)
PCP - Dr Orpah Greek Cardiologist - Rex Kras, DO Gastro - Dr Dayna Barker Pulmonary - Dr Baird Lyons  CT Chest x-ray - 05/14/22 EKG - 11/11/21 Stress Test - 12/08/21 PVC ECHO - 05/13/22 Cardiac Cath - 05/02/12  ICD Pacemaker/Loop - n/a  Sleep Study -  n/a CPAP - none  DIabetes Type 2 - diet controlled, no meds  If your blood sugar is less than 70 mg/dL, you will need to treat for low blood sugar: Treat a low blood sugar (less than 70 mg/dL) with  cup of clear juice (cranberry or apple), 4 glucose tablets, OR glucose gel. Recheck blood sugar in 15 minutes after treatment (to make sure it is greater than 70 mg/dL). If your blood sugar is not greater than 70 mg/dL on recheck, call [phone] for further instructions.  Aspirin Instructions: Follow your surgeon's instructions on when to stop aspirin prior to surgery,  If no instructions were given by your surgeon then you will need to call the office for those instructions.  ERAS: Clear liquids til 6:30 AM DOS  Anesthesia review: Yes  STOP now taking any Aspirin (unless otherwise instructed by your surgeon), Aleve, Naproxen, Ibuprofen, Motrin, Advil, Goody's, BC's, all herbal medications, fish oil, and all vitamins.   Coronavirus Screening Do you have any of the following symptoms:  Cough Yes Fever (>100.27F)  yes/no: No Runny nose yes/no: No Sore throat yes/no: No Difficulty breathing/shortness of breath  Yes  Have you traveled in the last 14 days and where? yes/no: No  Patient verbalized understanding of instructions that were given to them at the PAT appointment. Patient was also instructed that they will need to review over the PAT instructions again at home before surgery.

## 2022-05-29 ENCOUNTER — Other Ambulatory Visit: Payer: Self-pay

## 2022-05-29 NOTE — Progress Notes (Signed)
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  History includes former smoker (quit 06/21/02), COPD (home O2 2-3 L at night & with exertion), LLL squamous cell lung cancer (s/p left VATS/thoracotomy for LL lobectomy 01/26/02; left SCV Port-a-cath 03/24/02), dyspnea, DM2 (diet controlled), hypothyroidism, anemia, CKD, bipolar disorder, hyperparathyroidism (due to parathyroid adenoma, s/p left lower parathyroidectomy 02/18/00), bariatric surgery (laparoscopic gastric banding 08/2008; removal of laparoscopic gastric band and port, laparoscopic Roux-en-Y gastric bypass, open ventral hernia repair 03/18/17; laparoscopic repair of incarcerated incisional hernia 06/06/20), morbid obesity.  Last pulmonology visit 03/31/22 with Adrienne Pitter, NP for follow-up COPD and sinusitis/(mild) LLL pneumonia around August 2023. Lung clear and follow-up CXR stable. Still with persistent sinusitis symptoms then and advised Mucine, fluticasone and prednisone taper. She did not tolerate Breztri due to muscle cramps, so resuming Stiolto Respirmat for COPD recommended. [name] recommended visit for preoperative evaluation.  Last cardiology visit with Dr. Terri Mitchell was on 05/12/22. She had recently had ischemic work-up for precordial pain and was stated on statin therapy. May 2022 stress test was low risk and CT Coronary Scoring was 0. On 05/12/22, however, she presented as an acute visit with BLE edema x 6 weeks. Her PCP had prescribed Lasix but patient could not take regularly due to increased urination.  She had also gained 12 lbs in six months. BNP 61, D-dimer elevated at 1.15. No LE DVT by [place] or PE by CTA. 05/13/22 echo showed  normal LVF, EF 60-65%, normal global well motion, no significant valvular disease. On 05/25/22, Dr. Terri Mitchell wrote (see Letters tab), "[name] is at acceptable risk, from a cardiac standpoint, for her upcoming procedure: Cervical Fusion.    If applicable can hold aspirin  for 7  day(s) prior to procedure and re-start when hemostasis is achieved post surgery."  Dr. Lorin Mitchell has also requested pulmonology clearance/risk assessment, but this is pending. She also had reported some dysphagia with solids and liquids and has pending GI evaluation. Adrienne Core, PA-C with Dr. Lorin Mitchell to discuss with him if preoperative ENT or GI evaluation.   ADDENDUM 06/02/22 4:08 PM: No known pending preoperative ENT or GI visits when I communicated with Sherrie at Dr. Lorin Mitchell office on 06/01/22. Patient was able to scheduled pulmonology evaluation for 06/02/22.   Preoperative pulmonology evaluation by Adrienne Form, NP. She wrote,  "Surgical Clearance  Stable COPD Plan Continue Stialto 2 puffs once daily. Rinse mouth after use.  We will clear you for surgery. Your risk for prolonged mechanical ventilation is 4.2 % Your risk for post op pneumonia is 2%/ 1.6% based on 2 different evaluations Recommendations to decrease complications: Recommend 1. Short duration of surgery as much as possible and avoid paralytic if possible 2. Recovery in step down with Pulmonary consultation if she has trouble with extubation post op 3. DVT prophylaxis 4. Aggressive pulmonary toilet with o2, bronchodilatation, and incentive spirometry and early ambulation 5. Please do your Stialto morning of surgery, and daily there after ( Rinse mouth after use) 6. Albuterol nebs as needed for shortness of breath or wheezing"    VS: BP 117/73   Pulse 65   Temp 36.7 C   Resp 17  Ht 5' (1.524 m)   Wt 103.7 kg   SpO2 96%   BMI 44.65 kg/m    PROVIDERS: Adrienne Blackbird, MD is PCP Adrienne Kras, DO is cardiologist Adrienne Lyons, MD is  pulmonologist Adrienne Barker, MD is GI   LABS: Preoperative labs noted. She had a CMET on 05/12/22 that showedsodium 145, potassium 4.7, glucose 76, creatinine 1.27 (previously 1.31 03/31/22 and 1.17 11/21/21), eGFR 44, AST 26, ALT 16, NT proBNP 61. (all labs ordered are listed, but only abnormal results are displayed)  Labs Reviewed  CBC - Abnormal; Notable for the following components:      Result Value   RBC 5.74 (*)    Hemoglobin 15.6 (*)    HCT 49.6 (*)    RDW 16.0 (*)    All other components within normal limits  SURGICAL PCR SCREEN    IMAGES: CTA Chest 05/14/22: IMPRESSION: 1. No pulmonary embolism is seen. 2. Status post left lower lobe resection. No suspicious pulmonary nodule is seen. 3. High-grade right upper lobe and left lower lung cystic emphysematous changes. High-grade scarring within the lateral left lower lung. Emphysema (ICD10-J43.9).  CXR 03/31/22: IMPRESSION: Emphysematous changes of the lungs, with overall similar appearance to most recent chest x-ray and no radiographic evidence of acute cardiopulmonary disease  MRI C-spine 10/30/21: IMPRESSION: 1. Motion degraded study. This significantly limits evaluation for cord signal abnormality. No definite cord signal abnormality is identified. 2. 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 3. Solid fusion C4-5 and C5-6 without significant stenosis 4. Mild foraminal narrowing bilaterally C6-7. Moderate foraminal narrowing bilaterally at C7-T1.  MRI Abd 05/27/21 ([facility]): IMPRESSION: 1. 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 2. Small Bosniak class 2 proteinaceous/hemorrhagic cyst in the lower pole of the right kidney.   EKG:  11/11/21:  Normal sinus rhythm, 62 bpm, left axis, left anterior fascicular block, consider old anteroseptal infarct, TWI consider anterior and inferolateral ischemia.  Similar findings on prior ECG.   CV: Echocardiogram 05/13/2022: Study Quality: Technically difficult study. Normal LV systolic function with visual EF 60-65%. Left ventricle cavity is normal in size. Normal left ventricular wall thickness. Normal global wall motion. Normal diastolic filling pattern, normal LAP. No significant valvular heart disease. No prior studies for comparison.   Lower Extremity Venous Duplex (Bilateral) 05/13/2022:  No evidence of deep vein thrombosis of the lower extremities with normal  venous return.  There is generalized tissue edema noted.    CT Cardiac Scoring 12/10/2021: IMPRESSION: No visible coronary artery calcifications. Total coronary calcium score of 0. No acute extra cardiac abnormality.   Lexiscan Tetrofosmin stress test 12/08/2021: Lexiscan nuclear stress test performed using 1-day protocol. Normal myocardial perfusion. Stress LVEF 55%. Low risk study.  Past Medical History:  Diagnosis Date   Abdominal pain    around naval   Anxiety    Arthritis    Bipolar disorder ([place])    Breast pain in female    Bruises easily    Cancer ([place]) 2003   left lung   Cervical disc disease    Chronic kidney disease    COPD (chronic obstructive pulmonary disease) ([place])    O2 as needed.   Depression    DJD (degenerative joint disease)    Dyspnea    uses O2  via [place] 2-3L PRN   Gum disease    Hernia    Hypoglycemic episode in patient with diabetes mellitus ([place]) 2021   Diet controlled diabetes with occasional hypoglycemia at night   Hypothyroidism    no meds   IDA (iron deficiency anemia) 03/27/2021   in [place]   Insomnia    not currently a problem   Phlebitis    Pneumonia 03/2022   mild case   Pre-diabetes    diet controlled, no meds   Thyroid disease    [place] as ambulation aid     Wears glasses    Weight increase     Past Surgical History:  Procedure Laterality Date   ABDOMINAL HYSTERECTOMY     partial   CATARACT EXTRACTION  04/2022   FOOT SURGERY     right   HERNIA REPAIR     LAPAROSCOPIC GASTRIC BANDING  08/2008   LAPAROSCOPIC ROUX-EN-Y GASTRIC BYPASS WITH UPPER ENDOSCOPY AND REMOVAL OF LAP BAND  2018   LEFT HEART CATHETERIZATION WITH CORONARY ANGIOGRAM N/A 05/02/2012   Procedure: LEFT HEART CATHETERIZATION WITH CORONARY ANGIOGRAM;  Surgeon: Laverda Page, MD;  Location[facility] C[facility] LAB;  Service: Cardiovascular;  Laterality: N/A;   LUNG LOBECTOMY  2003   left   MASS EXCISION Right 06/06/2020   Procedure: REMOVAL MASS ON RIGHT BUTTOCK;  Surgeon: Michael Boston, MD;  Location: [facility] ORS;  Service: General;  Laterality: Right;   SPINE SURGERY     fusion on the neck   THYROIDECTOMY     partial   TONSILLECTOMY     VENTRAL HERNIA REPAIR N/A 06/06/2020   Procedure: [place] WITH MESH AND TAP BLOCK;  Surgeon: Michael Boston, MD;  Location: [facility] ORS;  Service: General;  Laterality: N/A;    MEDICATIONS:  acetaminophen (TYLENOL) 650 MG CR tablet   acidophilus (RISAQUAD) CAPS capsule   albuterol (PROVENTIL) (2.5 MG/3ML) 0.083% nebulizer solution   albuterol (VENTOLIN HFA) 108 (90 Base) MCG/ACT inhaler   aspirin EC 81 MG tablet   cetirizine (ZYRTEC) 10 MG tablet   chlorpheniramine-HYDROcodone (TUSSIONEX PENNKINETIC ER) 10-8 MG/5ML   Cholecalciferol (VITAMIN D3) 125 MCG (5000 UT) TABS   Cyanocobalamin (B-12) 5000 MCG CAPS   diphenhydrAMINE (BENADRYL) 12.5 MG/5ML liquid   FREESTYLE INSULINX TEST test strip   furosemide (LASIX) 20 MG tablet   gabapentin (NEURONTIN) 100 MG capsule   glucose 4 GM chewable tablet   ketoconazole (NIZORAL) 2 % cream   Lancets (FREESTYLE) lancets   Menthol-Methyl Salicylate (SALONPAS PAIN RELIEF PATCH) 3-10 % PTCH   Nebulizers (COMPRESSOR/NEBULIZER) MISC   nitroGLYCERIN (NITROSTAT) 0.4 MG SL  tablet   olmesartan-hydrochlorothiazide (BENICAR HCT) 20-12.5 MG tablet   Polyethyl Glycol-Propyl Glycol (SYSTANE OP)   Prednisolon-Moxiflox-Bromfenac 1-0.5-0.075 % SOLN   rosuvastatin (CRESTOR) 10 MG tablet   Tiotropium Bromide-Olodaterol (STIOLTO RESPIMAT) 2.5-2.5 MCG/ACT AERS   tiZANidine (ZANAFLEX) 4 MG tablet   trolamine salicylate (ASPERCREME) 10 % cream   No current facility-administered medications for this encounter.  Advised to follow surgeon instructions for perioperative aspirin.   Myra Gianotti, PA-C Surgical Short Stay/Anesthesiology [facility] Phone [phone] [facility] Phone [phone] 05/29/2022 3:56 PM

## 2022-05-30 ENCOUNTER — Other Ambulatory Visit: Payer: Self-pay | Admitting: Podiatry

## 2022-06-02 ENCOUNTER — Ambulatory Visit (INDEPENDENT_AMBULATORY_CARE_PROVIDER_SITE_OTHER): Payer: Medicare Other | Admitting: Acute Care

## 2022-06-02 ENCOUNTER — Encounter: Payer: Self-pay | Admitting: Acute Care

## 2022-06-02 VITALS — BP 120/78 | HR 68 | Temp 98.2°F | Ht 60.0 in | Wt 227.8 lb

## 2022-06-02 DIAGNOSIS — Z85118 Personal history of other malignant neoplasm of bronchus and lung: Secondary | ICD-10-CM | POA: Diagnosis not present

## 2022-06-02 DIAGNOSIS — J449 Chronic obstructive pulmonary disease, unspecified: Secondary | ICD-10-CM | POA: Diagnosis not present

## 2022-06-02 DIAGNOSIS — Z01818 Encounter for other preprocedural examination: Secondary | ICD-10-CM

## 2022-06-02 NOTE — Progress Notes (Signed)
History of Present Illness [name] is a  76 year old female, former smoker.  Past medical history significant for COPD mixed type, obstructive sleep apnea, chronic respiratory failure with hypoxia, type 2 diabetes, obesity. History of lung cancer with chemotherapy and left lobectomy 2003.  Patient of Dr. Annamaria Boots, and Derl Barrow NP   06/02/2022 Pt. Presents for surgical clearance. She is having a cervical discectomy Fusion by Dr. Rodell Perna on 06/05/2022. She did have a recent pneumonia 02/2022.She was treated with antibiotics and prednisone and recovered. She is  on baseline oxygen with ambulation ( 3 L ) She is not on her oxygen today, and sats were 98%. 4.2%, and th4 . She did not tolerate Breztri for her COPD maintenance  due to muscle aches. She was restarted on Stialto.  She states she has no breathing problems. She is compliant with her Stialto daily. She has had her flu vaccine.  She did just have cateract surgery and her vision is 20/20 for the first time since she was 61. She is in a very good mood. She does use her rescue inhaler about 4 times a week . She does not need any refills and she is up to date on her vaccines.    1) RISK FOR PROLONGED MECHANICAL VENTILAION - > 48h  1A) Arozullah - Prolonged mech ventilation risk Arozullah Postperative Pulmonary Risk Score - for mech ventilation dependence >48h [employer], Ann Surg 2000, major non-cardiac surgery) Comment Score  Type of surgery - abd ao aneurysm (27), thoracic (21), neurosurgery / upper abdominal / vascular (21), neck (11) Cervical discectomy 11  Emergency Surgery - (11) Scheduled 0  ALbumin < 3 or poor nutritional state - (9) 4.7 0  BUN > 30 -  (8)  0  Partial or completely dependent functional status - (7)  0  COPD -  (6)  6  Age - 60 to 6 (4), > 70  (6)  6  TOTAL    Risk Stratifcation scores  - < 10 (0.5%), 11-19 (1.8%), 20-27 (4.2%), 28-40 (10.1%), >40 (26.6%)  23   4.2 % risk of prolonged  mechanical Ventilation    2) RISK FOR POST OP PNEUMONIA Score source Risk  Lyndel Safe - Post Op Pnemounia risk  [email or web address] 2%    R3) ISK FOR ANY POST-OP PULMONARY COMPLICATION Score source Risk  CANET/ARISCAT Score - risk for ANY/ALl pulmonary complications - > risk of in-hospital post-op pulmonary complications (composite including respiratory failure, respiratory infection, pleural effusion, atelectasis, pneumothorax, bronchospasm, aspiration pneumonitis) [email or web address] - based on age, anemia, pulse ox, resp infection prior 30d, incision site, duration of surgery, and emergency v elective surgery Results for ARISCAT Score by QxMD   ARISCAT Score: 19  Risk: Low risk  Interpretation: Your patient has a 1.6% risk of in-hospital postoperative pulmonary complications, as defined by the occurrence of respiratory failure, respiratory infection, pleural effusion, atelectasis, pneumothorax, bronchospasm or aspiration pneumonitis.   Answers calculated to formulate result:  1. Age? -- 25 - 100 years  2. Preoperative SpO2? -- ? 96%  3. Respiratory infection in the last month? -- No 4. Preoperative anemia (Hb ? 10g/dL)? -- No 5. Surgical incision? -- Peripheral 6. Duration of surgery (hours)? -- 2-3 7. Emergency procedure? -- No   June 02, 2022 at 10:04 Calculated at: [email or web address].[email or web address]/ Get Calculate by QxMD for iOS, Android and web at http://qx.md/calculate       Major Pulmonary risks identified in the multifactorial risk analysis are but not limited to a)  pneumonia; b) recurrent intubation risk; c) prolonged or recurrent acute respiratory failure needing mechanical ventilation; d) prolonged hospitalization; e) DVT/Pulmonary embolism; f) Acute Pulmonary edema  Recommend 1. Short duration of surgery as much as possible and avoid paralytic if possible 2.  Recovery in step down with Pulmonary consultation if she has trouble with extubation post op 3. DVT prophylaxis 4. Aggressive pulmonary toilet with o2, bronchodilatation, and incentive spirometry and early ambulation 5. Please do your Stialto morning of surgery, and daily there after ( Rinse mouth after use) 6. Albuterol nebs as needed for shortness of breath or wheezing    Test Results:             Latest Ref Rng & Units 05/28/2022    2:23 PM 10/30/2020   10:28 AM 10/30/2020   10:21 AM  CBC  WBC 4.0 - 10.5 K/uL 6.0  4.4  CANCELED   Hemoglobin 12.0 - 15.0 g/dL 15.6  14.1    Hematocrit 36.0 - 46.0 % 49.6  44.3    Platelets 150 - 400 K/uL 187  197.0         Latest Ref Rng & Units 05/12/2022    4:08 PM 03/31/2022    3:13 PM 11/21/2021    3:18 PM  BMP  Glucose 70 - 99 mg/dL 76  86  86   BUN 8 - 27 mg/dL 17  30  19    Creatinine 0.57 - 1.00 mg/dL 1.27  1.32  1.17   BUN/Creat Ratio 12 - 28 13   16    Sodium 134 - 144 mmol/L 145  144  143   Potassium 3.5 - 5.2 mmol/L 4.7  4.2  4.3   Chloride 96 - 106 mmol/L 103  106  102   CO2 20 - 29 mmol/L 26  29  23    Calcium 8.7 - 10.3 mg/dL 9.6  9.3  9.2     BNP No results found for: "BNP"  ProBNP    Component Value Date/Time   PROBNP 61 05/12/2022 1608   PROBNP 13.0 03/31/2022 1513    PFT    Component Value Date/Time   FEV1PRE 1.52 05/03/2017 1248   FEV1POST 1.58 05/03/2017 1248   FVCPRE 2.70 05/03/2017 1248   FVCPOST 2.70 05/03/2017 1248   TLC 4.64 05/03/2017 1248   DLCOUNC 11.19 05/03/2017 1248   PREFEV1FVCRT 56 05/03/2017 1248   PSTFEV1FVCRT 59 05/03/2017 1248    CT Angio Chest Pulmonary Embolism (PE) W or WO Contrast  Result Date: 05/14/2022 CLINICAL DATA:  PE suspected. Positive D-dimer. Chest pain. Lower extremity swelling bilaterally for 6 weeks. History of lung cancer with chemotherapy and left lobectomy. History of COPD. EXAM: CT ANGIOGRAPHY CHEST WITH CONTRAST TECHNIQUE: Multidetector CT imaging of the chest was  performed using the standard protocol during bolus administration of intravenous contrast. Multiplanar CT image reconstructions and MIPs were obtained to evaluate the vascular anatomy. RADIATION DOSE REDUCTION: This exam was performed according to the departmental dose-optimization program which includes automated exposure control, adjustment of the mA and/or kV according to patient size and/or use of iterative reconstruction technique. CONTRAST:  74mL ISOVUE-370 IOPAMIDOL (ISOVUE-370) INJECTION 76% COMPARISON:  Chest two views 03/31/2022 and 03/09/2022;CTA chest 11/23/2016, limited coronary calcium CT 12/10/2021 FINDINGS: Cardiovascular: Main pulmonary artery is opacified up to 322 Hounsfield units. No filling defect is seen to indicate an acute pulmonary embolism. Heart size is normal. No pericardial effusion. No thoracic aortic aneurysm. Common origin of the right brachiocephalic and left common carotid arteries off of  the aortic arch, a normal variant. No aortic aneurysm. Mediastinum/Nodes: No axillary, mediastinal, or hilar pathologically enlarged lymph nodes by CT criteria. The visualized thyroid is grossly unremarkable. The esophagus follows a normal course and normal caliber. Lungs/Pleura: Postsurgical changes are again seen of left lower lobe resection. The central airways are patent. There are high-grade right upper lobe and left lower lung cystic emphysematous changes again seen. High-grade interstitial thickening and curvilinear scarring within the inferior anterior postsurgical left lung. No suspicious pulmonary nodule is seen. Upper Abdomen: Postsurgical changes are seen within the partially visualized stomach, likely from gastric bypass. Musculoskeletal: Moderate to severe multilevel disc space narrowing and endplate osteophytes throughout the thoracic spine. Healed left lateral fourth rib fracture, possibly from remote postsurgical thoracotomy, unchanged. Review of the MIP images confirms the above  findings. IMPRESSION: 1. No pulmonary embolism is seen. 2. Status post left lower lobe resection. No suspicious pulmonary nodule is seen. 3. High-grade right upper lobe and left lower lung cystic emphysematous changes. High-grade scarring within the lateral left lower lung. Emphysema (ICD10-J43.9). Electronically Signed   By: Yvonne Kendall M.D.   On: 05/14/2022 16:07   PCV ECHOCARDIOGRAM COMPLETE  Result Date: 05/13/2022 Echocardiogram 05/13/2022: Study Quality: Technically difficult study. Normal LV systolic function with visual EF 60-65%. Left ventricle cavity is normal in size. Normal left ventricular wall thickness. Normal global wall motion. Normal diastolic filling pattern, normal LAP. No significant valvular heart disease. No prior studies for comparison.   PCV LOWER VENOUS US (BILATERAL)  Result Date: 05/13/2022 Lower Extremity Venous Duplex (Bilateral) 05/13/2022: No evidence of deep vein thrombosis of the lower extremities with normal venous return. There is generalized tissue edema noted.     Past medical hx Past Medical History:  Diagnosis Date   Abdominal pain    around naval   Anxiety    Arthritis    Bipolar disorder ([place])    Breast pain in female    Bruises easily    Cancer ([place]) 2003   left lung   Cervical disc disease    Chronic kidney disease    COPD (chronic obstructive pulmonary disease) ([place])    O2 as needed.   Depression    DJD (degenerative joint disease)    Dyspnea    uses O2 via [place] 2-3L PRN   Gum disease    Hernia    Hypoglycemic episode in patient with diabetes mellitus ([place]) 2021   Diet controlled diabetes with occasional hypoglycemia at night   Hypothyroidism    no meds   IDA (iron deficiency anemia) 03/27/2021   in [place]   Insomnia    not currently a problem   Phlebitis    Pneumonia 03/2022   mild case   Pre-diabetes    diet controlled, no meds   Thyroid disease    Walker as ambulation aid    Wears glasses    Weight increase      Social  History   Tobacco Use   Smoking status: Former    Packs/day: 2.00    Years: 37.00    Total pack years: 74.00    Types: Cigarettes    Quit date: 06/21/2002    Years since quitting: 19.9   Smokeless tobacco: Never  Vaping Use   Vaping Use: Never used  Substance Use Topics   Alcohol use: Yes    Comment: Occisonal Liquor/WIne   Drug use: No    Ms.Hasan reports that she quit smoking about 19 years ago. Her smoking use included cigarettes.  She has a 74.00 pack-year smoking history. She has never used smokeless tobacco. She reports current alcohol use. She reports that she does not use drugs.  Tobacco Cessation: Quit 2003 with a 74 pack year smoking history  Past surgical hx, Family hx, Social hx all reviewed.  Current Outpatient Medications on File Prior to Visit  Medication Sig   acetaminophen (TYLENOL) 650 MG CR tablet Take 1,300 mg by mouth every 8 (eight) hours as needed for pain.   acidophilus (RISAQUAD) CAPS capsule Take 1 capsule by mouth daily.   albuterol (PROVENTIL) (2.5 MG/3ML) 0.083% nebulizer solution USE 1 VIAL IN NEBULIZER EVERY 8 HOURS AS NEEDED FOR WHEEZING OR SHORTNESS OF BREATH   albuterol (VENTOLIN HFA) 108 (90 Base) MCG/ACT inhaler TAKE 2 PUFFS BY MOUTH EVERY 6 HOURS AS NEEDED FOR WHEEZE OR SHORTNESS OF BREATH   aspirin EC 81 MG tablet Take 1 tablet (81 mg total) by mouth daily. Swallow whole.   cetirizine (ZYRTEC) 10 MG tablet Take 10 mg by mouth daily.   chlorpheniramine-HYDROcodone (TUSSIONEX PENNKINETIC ER) 10-8 MG/5ML Take 5 mLs by mouth every 12 (twelve) hours as needed for cough.   Cholecalciferol (VITAMIN D3) 125 MCG (5000 UT) TABS Take 10,000 Units by mouth daily.   Cyanocobalamin (B-12) 5000 MCG CAPS Take 5,000 mcg by mouth daily.   diphenhydrAMINE (BENADRYL) 12.5 MG/5ML liquid 25 mg every 6 (six) hours as needed for allergies or itching.   FREESTYLE INSULINX TEST test strip AS DIRECTED TO OBTAIN BLOOD TWICE A DAY AS NEEDED DX CODE E11.69 90 DAYS    furosemide (LASIX) 20 MG tablet Take 20 mg by mouth daily as needed for edema or fluid.   gabapentin (NEURONTIN) 100 MG capsule Take 200 mg by mouth 3 (three) times daily.   glucose 4 GM chewable tablet Chew 1-3 tablets by mouth as needed for low blood sugar.   ketoconazole (NIZORAL) 2 % cream Apply 1 application topically daily. (Patient taking differently: Apply 1 application  topically daily as needed for irritation.)   Lancets (FREESTYLE) lancets    Menthol-Methyl Salicylate (SALONPAS PAIN RELIEF PATCH) 3-10 % PTCH Place 1 patch onto the skin daily as needed (pain).   Nebulizers (COMPRESSOR/NEBULIZER) MISC Use as directed   nitroGLYCERIN (NITROSTAT) 0.4 MG SL tablet Place 1 tablet (0.4 mg total) under the tongue every 5 (five) minutes as needed for chest pain. If you require more than two tablets five minutes apart go to the nearest ER via EMS.   olmesartan-hydrochlorothiazide (BENICAR HCT) 20-12.5 MG tablet TAKE 1 TABLET BY MOUTH EVERY DAY (Patient taking differently: Take 1 tablet by mouth every other day.)   Polyethyl Glycol-Propyl Glycol (SYSTANE OP) Place 1 drop into both eyes 3 (three) times daily as needed (dry eyes).   Prednisolon-Moxiflox-Bromfenac 1-0.5-0.075 % SOLN Place 1 drop into the left eye in the morning, at noon, and at bedtime.   rosuvastatin (CRESTOR) 10 MG tablet Take 1 tablet (10 mg total) by mouth at bedtime.   Tiotropium Bromide-Olodaterol (STIOLTO RESPIMAT) 2.5-2.5 MCG/ACT AERS Inhale 2 puffs into the lungs daily.   tiZANidine (ZANAFLEX) 4 MG tablet Take 4 mg by mouth 2 (two) times daily as needed for muscle spasms.   trolamine salicylate (ASPERCREME) 10 % cream Apply 1 Application topically as needed for muscle pain.   No current facility-administered medications on file prior to visit.     No Known Allergies  Review Of Systems:  Constitutional:   No  weight loss, night sweats,  Fevers, chills, fatigue, or  lassitude.  HEENT:   No headaches,  Difficulty  swallowing,  Tooth/dental problems, or  Sore throat,                No sneezing, itching, ear ache, nasal congestion, post nasal drip,   CV:  No chest pain,  Orthopnea, PND, swelling in lower extremities, anasarca, dizziness, palpitations, syncope.   GI  No heartburn, indigestion, abdominal pain, nausea, vomiting, diarrhea, change in bowel habits, loss of appetite, bloody stools.   Resp: No shortness of breath with exertion or at rest.  No excess mucus, no productive cough,  No non-productive cough,  No coughing up of blood.  No change in color of mucus.  No wheezing.  No chest wall deformity  Skin: no rash or lesions.  GU: no dysuria, change in color of urine, no urgency or frequency.  No flank pain, no hematuria   MS:  No joint pain or swelling.  No decreased range of motion.  No back pain.  Psych:  No change in mood or affect. No depression or anxiety.  No memory loss.   Vital Signs BP 120/78 (BP Location: Right Arm, Cuff Size: Normal)   Pulse 68   Temp 98.2 F (36.8 C) (Oral)   Ht 5' (1.524 m)   Wt 227 lb 12.8 oz (103.3 kg)   SpO2 98%   BMI 44.49 kg/m    Physical Exam:  General- No distress,  A&Ox3 ENT: No sinus tenderness, TM clear, pale nasal mucosa, no oral exudate,no post nasal drip, no LAN Cardiac: S1, S2, regular rate and rhythm, no murmur Chest: No wheeze/ rales/ dullness; no accessory muscle use, no nasal flaring, no sternal retractions Abd.: Soft Non-tender Ext: No clubbing cyanosis, edema Neuro:  normal strength Skin: No rashes, warm and dry Psych: normal mood and behavior   Assessment/Plan Surgical Clearance  Stable COPD Plan Continue Stialto 2 puffs once daily. Rinse mouth after use.  We will clear you for surgery. Your risk for prolonged mechanical ventilation is 4.2 % Your risk for post op pneumonia is 2%/ 1.6% based on 2 different evaluations Recommendations to decrease complications: Recommend 1. Short duration of surgery as much as possible  and avoid paralytic if possible 2. Recovery in step down with Pulmonary consultation if she has trouble with extubation post op 3. DVT prophylaxis 4. Aggressive pulmonary toilet with o2, bronchodilatation, and incentive spirometry and early ambulation 5. Please do your Stialto morning of surgery, and daily there after ( Rinse mouth after use) 6. Albuterol nebs as needed for shortness of breath or wheezing    After your surgery, we will do a Therapeutic Trial with Trelegy Take 1 puff once daily Rinse mouth after use. If you like this medication let us know and we will send in a prescription  Good Luck with surgery and Have a Happy Holiday. Follow up with Dr. Annamaria Boots in  January of 2024 as is scheduled Please contact office for sooner follow up if symptoms do not improve or worsen or seek emergency care     I spent 40 minutes dedicated to the care of this patient on the date of this encounter to include pre-visit review of records, face-to-face time with the patient discussing conditions above, post visit ordering of testing, clinical documentation with the electronic health record, making appropriate referrals as documented, and communicating necessary information to the patient's healthcare team.   Magdalen Spatz, NP 06/02/2022  12:25 PM

## 2022-06-02 NOTE — Anesthesia Preprocedure Evaluation (Addendum)
Anesthesia Evaluation  Patient identified by MRN, date of birth, ID band Patient awake    Reviewed: Allergy & Precautions, NPO status , Patient's Chart, lab work & pertinent test results  Airway Mallampati: II  TM Distance: >3 FB Neck ROM: Limited    Dental no notable dental hx. (+) Dental Advisory Given, Chipped,    Pulmonary shortness of breath, with exertion, at rest and Long-Term Oxygen Therapy, asthma , sleep apnea , COPD,  oxygen dependent, former smoker Oxygen 2-3LPM [place] PRN- uses only occasionally, at night when she has a cold  Quit smoking 2003, 74 pack year history  Stiolto used this AM, albuterol uses 1-2x/wk  Audible wheeze in preop    + decreased breath sounds+ wheezing      Cardiovascular hypertension (118/85), Pt. on medications Normal cardiovascular exam Rhythm:Regular Rate:Normal     Neuro/Psych  PSYCHIATRIC DISORDERS Anxiety Depression Bipolar Disorder      GI/Hepatic Neg liver ROS, hiatal hernia,,,S/p lap roux en y 2018   Endo/Other  diabetesHypothyroidism  Morbid obesityBMI 45  Renal/GU negative Renal ROS  negative genitourinary   Musculoskeletal  (+) Arthritis , Osteoarthritis,    Abdominal  (+) + obese  Peds  Hematology Pre-diabetic  Hb 15.6, plt 187   Anesthesia Other Findings   Reproductive/Obstetrics negative OB ROS                             Anesthesia Physical Anesthesia Plan  ASA: 3  Anesthesia Plan: General   Post-op Pain Management: Tylenol PO (pre-op)*   Induction: Intravenous  PONV Risk Score and Plan: 3 and Ondansetron, Dexamethasone, Midazolam and Treatment may vary due to age or medical condition  Airway Management Planned: Oral ETT  Additional Equipment: None  Intra-op Plan:   Post-operative Plan: Extubation in OR  Informed Consent: I have reviewed the patients History and Physical, chart, labs and discussed the procedure including the  risks, benefits and alternatives for the proposed anesthesia with the patient or authorized representative who has indicated his/her understanding and acceptance.     Dental advisory given  Plan Discussed with: CRNA  Anesthesia Plan Comments: (2021 airway: Laryngoscope Size: Mac and 4 Grade View: Grade I Tube type: Oral Tube size: 7.5 mm Number of attempts: 1 )       Anesthesia Quick Evaluation

## 2022-06-02 NOTE — Patient Instructions (Addendum)
It is good to see you today.  Continue Stialto 2 puffs once daily. Rinse mouth after use.  We will clear you for surgery. Your risk for prolonged mechanical ventilation is 4.2 % Your risk for post op pneumonia is 2%/ 1.6% based on 2 different evaluations Recommendations to decrease complications: Recommend 1. Short duration of surgery as much as possible and avoid paralytic if possible 2. Recovery in step down with Pulmonary consultation if she has trouble with extubation post op 3. DVT prophylaxis 4. Aggressive pulmonary toilet with o2, bronchodilatation, and incentive spirometry and early ambulation 5. Please do your Stialto morning of surgery, and daily there after ( Rinse mouth after use) 6. Albuterol nebs as needed for shortness of breath or wheezing    After your surgery, we will do a Therapeutic Trial with Trelegy Take 1 puff once daily Rinse mouth after use. If you like this medication let us know and we will send in a prescription  Good Luck with surgery and Have a Happy Holiday. Follow up with Dr. Annamaria Boots in January of 2024 as is scheduled  Please contact office for sooner follow up if symptoms do not improve or worsen or seek emergency care

## 2022-06-03 NOTE — Telephone Encounter (Signed)
Please advise 

## 2022-06-04 NOTE — H&P (Signed)
[name] is an 76 y.o. female.   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  States that neck symptoms unchanged from previous visit.  She is wanting to proceed with C3-4 ACDF is scheduled.  Today history and physical performed.  We had previous cardiac clearance from June 2023 but patient states that she recently was seen by cardiologist about a week ago for evaluation.  We do not yet have updated clearance.  Surgery scheduler had also requested pulmonary clearance from Dr. Annamaria Boots but we do not have this yet.  Review of systems positive for dysphagia with solids and liquids.  Patient states that she has had this for years.  She was supposed to see a GI specialist at the request of her pulmonologist but she has not done so yet.  States that food and water gets stuck up in her throat.        Past Medical History:  Diagnosis Date   Abdominal pain    around naval   Anxiety    Arthritis    Bipolar disorder ([place])    Breast pain in female    Bruises easily    Cancer ([place]) 2003   left lung   Cervical disc disease    Chronic kidney disease    COPD (chronic obstructive pulmonary disease) ([place])    O2 as needed.   Depression    DJD (degenerative joint disease)    Dyspnea    uses O2 via [place] 2-3L PRN   Gum disease    Hernia    Hypoglycemic episode in patient with diabetes mellitus ([place]) 2021   Diet controlled diabetes with occasional hypoglycemia at night   Hypothyroidism    no meds   IDA (iron deficiency anemia) 03/27/2021   in [place]   Insomnia    not currently a problem   Phlebitis    Pneumonia 03/2022   mild case   Pre-diabetes    diet controlled, no meds   Thyroid disease    Walker as ambulation aid    Wears glasses    Weight increase     Past Surgical History:  Procedure Laterality Date   ABDOMINAL HYSTERECTOMY     partial   CATARACT EXTRACTION  04/2022   FOOT SURGERY      right   HERNIA REPAIR     LAPAROSCOPIC GASTRIC BANDING  08/2008   LAPAROSCOPIC ROUX-EN-Y GASTRIC BYPASS WITH UPPER ENDOSCOPY AND REMOVAL OF LAP BAND  2018   LEFT HEART CATHETERIZATION WITH CORONARY ANGIOGRAM N/A 05/02/2012   Procedure: LEFT HEART CATHETERIZATION WITH CORONARY ANGIOGRAM;  Surgeon: Laverda Page, MD;  Location: [facility] CATH LAB;  Service: Cardiovascular;  Laterality: N/A;   LUNG LOBECTOMY  2003   left   MASS EXCISION Right 06/06/2020   Procedure: REMOVAL MASS ON RIGHT BUTTOCK;  Surgeon: Michael Boston, MD;  Location: [facility] ORS;  Service: General;  Laterality: Right;   SPINE SURGERY     fusion on the neck   THYROIDECTOMY     partial   TONSILLECTOMY     VENTRAL HERNIA REPAIR N/A 06/06/2020   Procedure: LAPAROSCOPIC INCARCERATED INCISIONAL HERNIA REPAIR WITH MESH AND TAP BLOCK;  Surgeon: Michael Boston, MD;  Location: [facility] ORS;  Service: General;  Laterality: N/A;    Family History  Problem Relation Age of Onset   Stroke Mother    Stroke Father    Social History:  reports that she quit smoking  about 19 years ago. Her smoking use included cigarettes. She has a 74.00 pack-year smoking history. She has never used smokeless tobacco. She reports current alcohol use. She reports that she does not use drugs.  Allergies: No Known Allergies  No medications prior to admission.    No results found for this or any previous visit (from the past 48 hour(s)). No results found.  Review of Systems  Constitutional:  Positive for activity change.  HENT: Negative.    Respiratory: Negative.    Cardiovascular: Negative.   Gastrointestinal:        Dysphagia   Genitourinary: Negative.   Neurological:  Positive for numbness.  Psychiatric/Behavioral: Negative.      There were no vitals taken for this visit. Physical Exam HENT:     Head: Normocephalic and atraumatic.     Nose: Nose normal.  Eyes:     Extraocular Movements: Extraocular movements intact.  Cardiovascular:     Rate and  Rhythm: Normal rate.  Pulmonary:     Effort: No respiratory distress.  Abdominal:     General: Bowel sounds are normal.     Tenderness: There is no abdominal tenderness.  Neurological:     Mental Status: She is alert and oriented to person, place, and time.  Psychiatric:        Mood and Affect: Mood normal.      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  I will see if Dr. Lorin Mercy thinks that patient needs to be evaluated by ENT or GI before her surgery.  Patient understands that from cervical fusion alone that we could cause some dysphagia but her symptoms may likely be worsened since she already has that problem.  In I will get Dr. Narda Amber view on this.  Patient advised. Benjiman Core, PA-C 06/04/2022, 5:13 PM

## 2022-06-05 ENCOUNTER — Ambulatory Visit (HOSPITAL_COMMUNITY): Payer: Medicare Other

## 2022-06-05 ENCOUNTER — Encounter (HOSPITAL_COMMUNITY)
Admission: RE | Disposition: A | Discharge status: Home / Self Care | Payer: Self-pay | Source: Home / Self Care | Attending: Orthopaedic Surgery

## 2022-06-05 ENCOUNTER — Other Ambulatory Visit: Payer: Self-pay

## 2022-06-05 ENCOUNTER — Observation Stay (HOSPITAL_COMMUNITY)
Admission: RE | Admit: 2022-06-05 | Discharge: 2022-06-06 | Disposition: A | Discharge status: Home / Self Care | Payer: Medicare Other | Attending: Orthopaedic Surgery | Admitting: Orthopaedic Surgery

## 2022-06-05 ENCOUNTER — Ambulatory Visit (HOSPITAL_BASED_OUTPATIENT_CLINIC_OR_DEPARTMENT_OTHER): Payer: Medicare Other | Admitting: Anesthesiology

## 2022-06-05 ENCOUNTER — Encounter (HOSPITAL_COMMUNITY): Payer: Self-pay | Admitting: Orthopaedic Surgery

## 2022-06-05 ENCOUNTER — Encounter: Payer: Medicare Other | Admitting: Orthopaedic Surgery

## 2022-06-05 ENCOUNTER — Ambulatory Visit (HOSPITAL_COMMUNITY): Payer: Medicare Other | Admitting: Vascular Surgery

## 2022-06-05 DIAGNOSIS — E039 Hypothyroidism, unspecified: Secondary | ICD-10-CM | POA: Insufficient documentation

## 2022-06-05 DIAGNOSIS — E1122 Type 2 diabetes mellitus with diabetic chronic kidney disease: Secondary | ICD-10-CM | POA: Diagnosis not present

## 2022-06-05 DIAGNOSIS — Z85118 Personal history of other malignant neoplasm of bronchus and lung: Secondary | ICD-10-CM | POA: Insufficient documentation

## 2022-06-05 DIAGNOSIS — M47812 Spondylosis without myelopathy or radiculopathy, cervical region: Secondary | ICD-10-CM | POA: Diagnosis not present

## 2022-06-05 DIAGNOSIS — N189 Chronic kidney disease, unspecified: Secondary | ICD-10-CM | POA: Insufficient documentation

## 2022-06-05 DIAGNOSIS — Z87891 Personal history of nicotine dependence: Secondary | ICD-10-CM

## 2022-06-05 DIAGNOSIS — J449 Chronic obstructive pulmonary disease, unspecified: Secondary | ICD-10-CM | POA: Diagnosis not present

## 2022-06-05 DIAGNOSIS — M4802 Spinal stenosis, cervical region: Secondary | ICD-10-CM

## 2022-06-05 DIAGNOSIS — I10 Essential (primary) hypertension: Secondary | ICD-10-CM | POA: Diagnosis not present

## 2022-06-05 DIAGNOSIS — Z01818 Encounter for other preprocedural examination: Secondary | ICD-10-CM

## 2022-06-05 DIAGNOSIS — M4722 Other spondylosis with radiculopathy, cervical region: Principal | ICD-10-CM | POA: Insufficient documentation

## 2022-06-05 HISTORY — PX: ANTERIOR CERVICAL DECOMP/DISCECTOMY FUSION: SHX1161

## 2022-06-05 LAB — GLUCOSE, CAPILLARY: Glucose-Capillary: 111 mg/dL — ABNORMAL HIGH (ref 70–99)

## 2022-06-05 SURGERY — ANTERIOR CERVICAL DECOMPRESSION/DISCECTOMY FUSION 1 LEVEL
Anesthesia: General

## 2022-06-05 MED ORDER — HYDROCODONE-ACETAMINOPHEN 10-325 MG PO TABS: 1.0000 | ORAL_TABLET | Freq: Four times a day (QID) | ORAL | 0 refills | Status: DC | PRN

## 2022-06-05 MED ORDER — SODIUM CHLORIDE 0.9 % IV SOLN: INTRAVENOUS | Status: DC

## 2022-06-05 MED ORDER — KETAMINE HCL 50 MG/5ML IJ SOSY
PREFILLED_SYRINGE | INTRAMUSCULAR | Status: AC
  Filled 2022-06-05: qty 5

## 2022-06-05 MED ORDER — SODIUM CHLORIDE 0.9% FLUSH: 3.0000 mL | INTRAVENOUS | Status: DC | PRN

## 2022-06-05 MED ORDER — CEFAZOLIN SODIUM-DEXTROSE 2-4 GM/100ML-% IV SOLN
2.0000 g | INTRAVENOUS | Status: AC
  Administered 2022-06-05: 2 g via INTRAVENOUS
  Filled 2022-06-05: qty 100

## 2022-06-05 MED ORDER — PHENOL 1.4 % MT LIQD: 1.0000 | OROMUCOSAL | Status: DC | PRN

## 2022-06-05 MED ORDER — 0.9 % SODIUM CHLORIDE (POUR BTL) OPTIME
TOPICAL | Status: DC | PRN
  Administered 2022-06-05: 1000 mL

## 2022-06-05 MED ORDER — CHLORHEXIDINE GLUCONATE 0.12 % MT SOLN
OROMUCOSAL | Status: AC
  Administered 2022-06-05: 15 mL via OROMUCOSAL
  Filled 2022-06-05: qty 15

## 2022-06-05 MED ORDER — OLMESARTAN MEDOXOMIL-HCTZ 20-12.5 MG PO TABS: 1.0000 | ORAL_TABLET | ORAL | Status: DC

## 2022-06-05 MED ORDER — ONDANSETRON HCL 4 MG/2ML IJ SOLN: 4.0000 mg | Freq: Once | INTRAMUSCULAR | Status: DC | PRN

## 2022-06-05 MED ORDER — METHOCARBAMOL 500 MG PO TABS: 500.0000 mg | ORAL_TABLET | Freq: Four times a day (QID) | ORAL | 0 refills | Status: DC | PRN

## 2022-06-05 MED ORDER — HYDROMORPHONE HCL 1 MG/ML IJ SOLN
INTRAMUSCULAR | Status: AC
  Filled 2022-06-05: qty 1

## 2022-06-05 MED ORDER — ROSUVASTATIN CALCIUM 5 MG PO TABS
10.0000 mg | ORAL_TABLET | Freq: Every day | ORAL | Status: DC
  Filled 2022-06-05: qty 2

## 2022-06-05 MED ORDER — IRBESARTAN 150 MG PO TABS: 150.0000 mg | ORAL_TABLET | Freq: Every day | ORAL | Status: DC

## 2022-06-05 MED ORDER — SODIUM CHLORIDE 0.9% FLUSH
3.0000 mL | Freq: Two times a day (BID) | INTRAVENOUS | Status: DC
  Administered 2022-06-05: 3 mL via INTRAVENOUS

## 2022-06-05 MED ORDER — GLUCOSE 4 G PO CHEW: 1.0000 | CHEWABLE_TABLET | ORAL | Status: DC | PRN

## 2022-06-05 MED ORDER — SURGIFLO WITH THROMBIN (HEMOSTATIC MATRIX KIT) OPTIME
TOPICAL | Status: DC | PRN
  Administered 2022-06-05: 1 via TOPICAL

## 2022-06-05 MED ORDER — METHOCARBAMOL 1000 MG/10ML IJ SOLN: 500.0000 mg | Freq: Four times a day (QID) | INTRAVENOUS | Status: DC | PRN

## 2022-06-05 MED ORDER — ORAL CARE MOUTH RINSE: 15.0000 mL | Freq: Once | OROMUCOSAL | Status: AC

## 2022-06-05 MED ORDER — AMISULPRIDE (ANTIEMETIC) 5 MG/2ML IV SOLN: 10.0000 mg | Freq: Once | INTRAVENOUS | Status: DC | PRN

## 2022-06-05 MED ORDER — DOCUSATE SODIUM 100 MG PO CAPS
100.0000 mg | ORAL_CAPSULE | Freq: Two times a day (BID) | ORAL | Status: DC
  Administered 2022-06-05: 100 mg via ORAL
  Filled 2022-06-05 (×2): qty 1

## 2022-06-05 MED ORDER — CHLORHEXIDINE GLUCONATE 0.12 % MT SOLN: 15.0000 mL | Freq: Once | OROMUCOSAL | Status: AC

## 2022-06-05 MED ORDER — LORATADINE 10 MG PO TABS
10.0000 mg | ORAL_TABLET | Freq: Every day | ORAL | Status: DC
  Filled 2022-06-05: qty 1

## 2022-06-05 MED ORDER — HYDROCHLOROTHIAZIDE 12.5 MG PO TABS: 12.5000 mg | ORAL_TABLET | Freq: Every day | ORAL | Status: DC

## 2022-06-05 MED ORDER — HYDROMORPHONE HCL 1 MG/ML IJ SOLN
0.5000 mg | INTRAMUSCULAR | Status: DC | PRN
  Filled 2022-06-05 (×2): qty 0.5

## 2022-06-05 MED ORDER — DIPHENHYDRAMINE HCL 12.5 MG/5ML PO ELIX: 25.0000 mg | ORAL_SOLUTION | Freq: Three times a day (TID) | ORAL | Status: DC | PRN

## 2022-06-05 MED ORDER — ONDANSETRON HCL 4 MG/2ML IJ SOLN: 4.0000 mg | Freq: Four times a day (QID) | INTRAMUSCULAR | Status: DC | PRN

## 2022-06-05 MED ORDER — ALBUTEROL SULFATE (2.5 MG/3ML) 0.083% IN NEBU: 2.5000 mg | INHALATION_SOLUTION | Freq: Three times a day (TID) | RESPIRATORY_TRACT | Status: DC | PRN

## 2022-06-05 MED ORDER — LIDOCAINE 2% (20 MG/ML) 5 ML SYRINGE
INTRAMUSCULAR | Status: AC
  Filled 2022-06-05: qty 5

## 2022-06-05 MED ORDER — BUPIVACAINE-EPINEPHRINE (PF) 0.5% -1:200000 IJ SOLN
INTRAMUSCULAR | Status: AC
  Filled 2022-06-05: qty 30

## 2022-06-05 MED ORDER — GABAPENTIN 100 MG PO CAPS
200.0000 mg | ORAL_CAPSULE | Freq: Three times a day (TID) | ORAL | Status: DC
  Administered 2022-06-06: 200 mg via ORAL
  Filled 2022-06-05 (×3): qty 2

## 2022-06-05 MED ORDER — OXYCODONE HCL 5 MG PO TABS
5.0000 mg | ORAL_TABLET | Freq: Once | ORAL | Status: AC | PRN
  Administered 2022-06-05: 5 mg via ORAL

## 2022-06-05 MED ORDER — PHENYLEPHRINE 80 MCG/ML (10ML) SYRINGE FOR IV PUSH (FOR BLOOD PRESSURE SUPPORT)
PREFILLED_SYRINGE | INTRAVENOUS | Status: DC | PRN
  Administered 2022-06-05: 160 ug via INTRAVENOUS
  Administered 2022-06-05 (×3): 80 ug via INTRAVENOUS
  Administered 2022-06-05: 160 ug via INTRAVENOUS
  Administered 2022-06-05: 80 ug via INTRAVENOUS
  Administered 2022-06-05: 240 ug via INTRAVENOUS
  Administered 2022-06-05: 160 ug via INTRAVENOUS

## 2022-06-05 MED ORDER — ALBUTEROL SULFATE HFA 108 (90 BASE) MCG/ACT IN AERS
INHALATION_SPRAY | RESPIRATORY_TRACT | Status: DC | PRN
  Administered 2022-06-05: 8 via RESPIRATORY_TRACT

## 2022-06-05 MED ORDER — PHENYLEPHRINE 80 MCG/ML (10ML) SYRINGE FOR IV PUSH (FOR BLOOD PRESSURE SUPPORT)
PREFILLED_SYRINGE | INTRAVENOUS | Status: AC
  Filled 2022-06-05: qty 10

## 2022-06-05 MED ORDER — ROCURONIUM BROMIDE 10 MG/ML (PF) SYRINGE
PREFILLED_SYRINGE | INTRAVENOUS | Status: AC
  Filled 2022-06-05: qty 10

## 2022-06-05 MED ORDER — POLYETHYL GLYCOL-PROPYL GLYCOL 0.4-0.3 % OP GEL: Freq: Three times a day (TID) | OPHTHALMIC | Status: DC | PRN

## 2022-06-05 MED ORDER — FENTANYL CITRATE (PF) 250 MCG/5ML IJ SOLN
INTRAMUSCULAR | Status: AC
  Filled 2022-06-05: qty 5

## 2022-06-05 MED ORDER — POLYETHYLENE GLYCOL 3350 17 G PO PACK: 17.0000 g | PACK | Freq: Every day | ORAL | Status: DC

## 2022-06-05 MED ORDER — PROPOFOL 10 MG/ML IV BOLUS
INTRAVENOUS | Status: DC | PRN
  Administered 2022-06-05: 150 mg via INTRAVENOUS

## 2022-06-05 MED ORDER — PREDNISOLON-MOXIFLOX-BROMFENAC 1-0.5-0.075 % OP SOLN: 1.0000 [drp] | Freq: Three times a day (TID) | OPHTHALMIC | Status: DC

## 2022-06-05 MED ORDER — ACETAMINOPHEN 500 MG PO TABS
1000.0000 mg | ORAL_TABLET | Freq: Once | ORAL | Status: AC
  Administered 2022-06-05: 1000 mg via ORAL
  Filled 2022-06-05: qty 2

## 2022-06-05 MED ORDER — ONDANSETRON HCL 4 MG/2ML IJ SOLN
INTRAMUSCULAR | Status: DC | PRN
  Administered 2022-06-05: 4 mg via INTRAVENOUS

## 2022-06-05 MED ORDER — SUGAMMADEX SODIUM 200 MG/2ML IV SOLN
INTRAVENOUS | Status: DC | PRN
  Administered 2022-06-05: 200 mg via INTRAVENOUS

## 2022-06-05 MED ORDER — OXYCODONE HCL 5 MG/5ML PO SOLN: 5.0000 mg | Freq: Once | ORAL | Status: AC | PRN

## 2022-06-05 MED ORDER — BUPIVACAINE-EPINEPHRINE 0.5% -1:200000 IJ SOLN
INTRAMUSCULAR | Status: DC | PRN
  Administered 2022-06-05: 6 mL

## 2022-06-05 MED ORDER — POLYVINYL ALCOHOL 1.4 % OP SOLN: 1.0000 [drp] | OPHTHALMIC | Status: DC | PRN

## 2022-06-05 MED ORDER — ONDANSETRON HCL 4 MG/2ML IJ SOLN
INTRAMUSCULAR | Status: AC
  Filled 2022-06-05: qty 2

## 2022-06-05 MED ORDER — EPHEDRINE SULFATE-NACL 50-0.9 MG/10ML-% IV SOSY
PREFILLED_SYRINGE | INTRAVENOUS | Status: DC | PRN
  Administered 2022-06-05: 10 mg via INTRAVENOUS

## 2022-06-05 MED ORDER — ACETAMINOPHEN 650 MG RE SUPP: 650.0000 mg | RECTAL | Status: DC | PRN

## 2022-06-05 MED ORDER — ARFORMOTEROL TARTRATE 15 MCG/2ML IN NEBU
15.0000 ug | INHALATION_SOLUTION | Freq: Two times a day (BID) | RESPIRATORY_TRACT | Status: DC
  Administered 2022-06-06: 15 ug via RESPIRATORY_TRACT
  Filled 2022-06-05 (×2): qty 2

## 2022-06-05 MED ORDER — PROPOFOL 10 MG/ML IV BOLUS
INTRAVENOUS | Status: AC
  Filled 2022-06-05: qty 20

## 2022-06-05 MED ORDER — DEXAMETHASONE SODIUM PHOSPHATE 10 MG/ML IJ SOLN
INTRAMUSCULAR | Status: AC
  Filled 2022-06-05: qty 1

## 2022-06-05 MED ORDER — LACTATED RINGERS IV SOLN: INTRAVENOUS | Status: DC

## 2022-06-05 MED ORDER — MIDAZOLAM HCL 2 MG/2ML IJ SOLN
INTRAMUSCULAR | Status: DC | PRN
  Administered 2022-06-05: 2 mg via INTRAVENOUS

## 2022-06-05 MED ORDER — OXYCODONE HCL 5 MG PO TABS
ORAL_TABLET | ORAL | Status: AC
  Filled 2022-06-05: qty 1

## 2022-06-05 MED ORDER — MIDAZOLAM HCL 2 MG/2ML IJ SOLN
INTRAMUSCULAR | Status: AC
  Filled 2022-06-05: qty 2

## 2022-06-05 MED ORDER — LIDOCAINE 2% (20 MG/ML) 5 ML SYRINGE
INTRAMUSCULAR | Status: DC | PRN
  Administered 2022-06-05: 60 mg via INTRAVENOUS

## 2022-06-05 MED ORDER — FENTANYL CITRATE (PF) 250 MCG/5ML IJ SOLN
INTRAMUSCULAR | Status: DC | PRN
  Administered 2022-06-05 (×2): 50 ug via INTRAVENOUS

## 2022-06-05 MED ORDER — FUROSEMIDE 20 MG PO TABS: 20.0000 mg | ORAL_TABLET | Freq: Every day | ORAL | Status: DC | PRN

## 2022-06-05 MED ORDER — ALBUTEROL SULFATE HFA 108 (90 BASE) MCG/ACT IN AERS: 2.0000 | INHALATION_SPRAY | Freq: Four times a day (QID) | RESPIRATORY_TRACT | Status: DC | PRN

## 2022-06-05 MED ORDER — OXYCODONE HCL 5 MG PO TABS: 5.0000 mg | ORAL_TABLET | Freq: Four times a day (QID) | ORAL | Status: DC | PRN

## 2022-06-05 MED ORDER — MENTHOL 3 MG MT LOZG: 1.0000 | LOZENGE | OROMUCOSAL | Status: DC | PRN

## 2022-06-05 MED ORDER — ONDANSETRON HCL 4 MG PO TABS: 4.0000 mg | ORAL_TABLET | Freq: Four times a day (QID) | ORAL | Status: DC | PRN

## 2022-06-05 MED ORDER — OXYCODONE HCL 5 MG PO TABS
5.0000 mg | ORAL_TABLET | Freq: Four times a day (QID) | ORAL | Status: DC | PRN
  Administered 2022-06-05 – 2022-06-06 (×3): 5 mg via ORAL
  Filled 2022-06-05 (×3): qty 1

## 2022-06-05 MED ORDER — SODIUM CHLORIDE 0.9 % IV SOLN: 250.0000 mL | INTRAVENOUS | Status: DC

## 2022-06-05 MED ORDER — ACETAMINOPHEN 325 MG PO TABS
650.0000 mg | ORAL_TABLET | ORAL | Status: DC | PRN
  Administered 2022-06-05 – 2022-06-06 (×2): 650 mg via ORAL
  Filled 2022-06-05 (×2): qty 2

## 2022-06-05 MED ORDER — UMECLIDINIUM BROMIDE 62.5 MCG/ACT IN AEPB
1.0000 | INHALATION_SPRAY | Freq: Every day | RESPIRATORY_TRACT | Status: DC
  Filled 2022-06-05: qty 7

## 2022-06-05 MED ORDER — ROCURONIUM BROMIDE 10 MG/ML (PF) SYRINGE
PREFILLED_SYRINGE | INTRAVENOUS | Status: DC | PRN
  Administered 2022-06-05: 20 mg via INTRAVENOUS
  Administered 2022-06-05: 100 mg via INTRAVENOUS
  Administered 2022-06-05: 20 mg via INTRAVENOUS

## 2022-06-05 MED ORDER — NITROGLYCERIN 0.4 MG SL SUBL: 0.4000 mg | SUBLINGUAL_TABLET | SUBLINGUAL | Status: DC | PRN

## 2022-06-05 MED ORDER — DEXAMETHASONE SODIUM PHOSPHATE 10 MG/ML IJ SOLN
INTRAMUSCULAR | Status: DC | PRN
  Administered 2022-06-05: 5 mg via INTRAVENOUS

## 2022-06-05 MED ORDER — METHOCARBAMOL 500 MG PO TABS
500.0000 mg | ORAL_TABLET | Freq: Four times a day (QID) | ORAL | Status: DC | PRN
  Administered 2022-06-06 (×2): 500 mg via ORAL
  Filled 2022-06-05 (×2): qty 1

## 2022-06-05 MED ORDER — HYDROMORPHONE HCL 1 MG/ML IJ SOLN
0.2500 mg | INTRAMUSCULAR | Status: DC | PRN
  Administered 2022-06-05 (×2): 0.5 mg via INTRAVENOUS

## 2022-06-05 SURGICAL SUPPLY — 59 items
AGENT HMST KT MTR STRL THRMB (HEMOSTASIS) ×1
APL SKNCLS STERI-STRIP NONHPOA (GAUZE/BANDAGES/DRESSINGS) ×1
BAG COUNTER SPONGE SURGICOUNT (BAG) ×2 IMPLANT
BAG SPNG CNTER NS LX DISP (BAG) ×1
BENZOIN TINCTURE PRP APPL 2/3 (GAUZE/BANDAGES/DRESSINGS) ×2 IMPLANT
BIT DRILL SM SPINE QC 12 (BIT) IMPLANT
BIT DRILL SM SPINE QC 14 (BIT) IMPLANT
BLADE CLIPPER SURG (BLADE) IMPLANT
BONE CC-ACS 11X14X9 6D (Bone Implant) ×1 IMPLANT
BUR ROUND FLUTED 4 SOFT TCH (BURR) ×2 IMPLANT
CHIPS BONE CANC-ACS11X14X9 6D (Bone Implant) IMPLANT
COLLAR CERV LO CONTOUR FIRM DE (SOFTGOODS) ×2 IMPLANT
COVER MAYO STAND STRL (DRAPES) ×2 IMPLANT
COVER SURGICAL LIGHT HANDLE (MISCELLANEOUS) ×2 IMPLANT
DRAPE C-ARM 42X72 X-RAY (DRAPES) ×2 IMPLANT
DRAPE HALF SHEET 40X57 (DRAPES) ×2 IMPLANT
DRAPE MICROSCOPE SLANT 54X150 (MISCELLANEOUS) ×2 IMPLANT
DURAPREP 6ML APPLICATOR 50/CS (WOUND CARE) ×2 IMPLANT
ELECT BLADE 4.0 EZ CLEAN MEGAD (MISCELLANEOUS) ×1
ELECT COATED BLADE 2.86 ST (ELECTRODE) ×2 IMPLANT
ELECT REM PT RETURN 9FT ADLT (ELECTROSURGICAL) ×1
ELECTRODE BLDE 4.0 EZ CLN MEGD (MISCELLANEOUS) IMPLANT
ELECTRODE REM PT RTRN 9FT ADLT (ELECTROSURGICAL) ×2 IMPLANT
EVACUATOR 1/8 PVC DRAIN (DRAIN) ×2 IMPLANT
GAUZE SPONGE 4X4 12PLY STRL (GAUZE/BANDAGES/DRESSINGS) ×2 IMPLANT
GAUZE SPONGE 4X4 12PLY STRL LF (GAUZE/BANDAGES/DRESSINGS) IMPLANT
GLOVE BIOGEL PI IND STRL 8 (GLOVE) ×4 IMPLANT
GLOVE ORTHO TXT STRL SZ7.5 (GLOVE) ×4 IMPLANT
GOWN STRL REUS W/ TWL LRG LVL3 (GOWN DISPOSABLE) ×2 IMPLANT
GOWN STRL REUS W/ TWL XL LVL3 (GOWN DISPOSABLE) ×2 IMPLANT
GOWN STRL REUS W/TWL 2XL LVL3 (GOWN DISPOSABLE) ×2 IMPLANT
GOWN STRL REUS W/TWL LRG LVL3 (GOWN DISPOSABLE) ×1
GOWN STRL REUS W/TWL XL LVL3 (GOWN DISPOSABLE) ×1
HALTER HD/CHIN CERV TRACTION D (MISCELLANEOUS) ×2 IMPLANT
KIT BASIN OR (CUSTOM PROCEDURE TRAY) ×2 IMPLANT
KIT TURNOVER KIT B (KITS) ×2 IMPLANT
MANIFOLD NEPTUNE II (INSTRUMENTS) ×2 IMPLANT
NDL 25GX 5/8IN NON SAFETY (NEEDLE) ×2 IMPLANT
NEEDLE 25GX 5/8IN NON SAFETY (NEEDLE) ×1 IMPLANT
NS IRRIG 1000ML POUR BTL (IV SOLUTION) ×2 IMPLANT
PACK ORTHO CERVICAL (CUSTOM PROCEDURE TRAY) ×2 IMPLANT
PAD ARMBOARD 7.5X6 YLW CONV (MISCELLANEOUS) ×4 IMPLANT
PATTIES SURGICAL .5 X.5 (GAUZE/BANDAGES/DRESSINGS) IMPLANT
PIN TEMP FIXATION KIRSCHNER (EXFIX) IMPLANT
PLATE ANT CERV XTEND 1 LV 16 (Plate) IMPLANT
POSITIONER HEAD DONUT 9IN (MISCELLANEOUS) ×2 IMPLANT
SCREW XTD VAR 4.2 SELF TAP 12 (Screw) IMPLANT
SCREW XTEND SELFTAP VAR 4.6X14 (Screw) IMPLANT
STRIP CLOSURE SKIN 1/2X4 (GAUZE/BANDAGES/DRESSINGS) ×2 IMPLANT
SURGIFLO W/THROMBIN 8M KIT (HEMOSTASIS) IMPLANT
SUT BONE WAX W31G (SUTURE) ×2 IMPLANT
SUT VIC AB 3-0 PS2 18 (SUTURE) ×1
SUT VIC AB 3-0 PS2 18XBRD (SUTURE) ×2 IMPLANT
SUT VIC AB 4-0 PS2 27 (SUTURE) ×2 IMPLANT
SYR BULB EAR ULCER 3OZ GRN STR (SYRINGE) ×2 IMPLANT
TAPE CLOTH SOFT 2X10 (GAUZE/BANDAGES/DRESSINGS) IMPLANT
TOWEL GREEN STERILE (TOWEL DISPOSABLE) ×2 IMPLANT
TOWEL GREEN STERILE FF (TOWEL DISPOSABLE) ×2 IMPLANT
WATER STERILE IRR 1000ML POUR (IV SOLUTION) ×2 IMPLANT

## 2022-06-05 NOTE — Progress Notes (Signed)
Orthopedic Tech Progress Note Patient Details:  [name] Jun 21, 1946 [id number]   Dropped off soft cervical collar to pt's room. Pt already had a soft cervical collar on but the RN stated that the MD wanted a collar to send home with her. Pt also stated that the MD wanted a soft collar to be sent home with her. Left the soft collar in the pt's room.  Ortho Devices Type of Ortho Device: Soft collar Ortho Device/Splint Location: NECK Ortho Device/Splint Interventions: Ordered      Arville Go 06/05/2022, 2:41 PM

## 2022-06-05 NOTE — Anesthesia Procedure Notes (Signed)
Procedure Name: Intubation Date/Time: 06/05/2022 7:45 AM  Performed by: Elvin So, CRNAPre-anesthesia Checklist: Patient identified, Emergency Drugs available, Suction available and Patient being monitored Patient Re-evaluated:Patient Re-evaluated prior to induction Oxygen Delivery Method: Circle System Utilized Preoxygenation: Pre-oxygenation with 100% oxygen Induction Type: IV induction Ventilation: Mask ventilation without difficulty Laryngoscope Size: Glidescope and 3 Grade View: Grade I Tube type: Oral Tube size: 7.0 mm Number of attempts: 1 Airway Equipment and Method: Stylet and Oral airway Placement Confirmation: ETT inserted through vocal cords under direct vision, positive ETCO2 and breath sounds checked- equal and bilateral Secured at: 22 cm Tube secured with: Tape Dental Injury: Teeth and Oropharynx as per pre-operative assessment

## 2022-06-05 NOTE — Transfer of Care (Signed)
Immediate Anesthesia Transfer of Care Note  Patient: Keylee A Iglehart  Procedure(s) Performed: C3-4 ANTERIOR CERVICAL DISCECTOMY FUSION, ALLOGRAFT, PLATE  Patient Location: PACU  Anesthesia Type:General  Level of Consciousness: awake and patient cooperative  Airway & Oxygen Therapy: Patient Spontanous Breathing and Patient connected to face mask oxygen  Post-op Assessment: Report given to RN, Post -op Vital signs reviewed and stable, and Patient moving all extremities  Post vital signs: Reviewed and stable  Last Vitals:  Vitals Value Taken Time  BP 126/77 06/05/22 1300  Temp 36.4 C 06/05/22 1145  Pulse 89 06/05/22 1305  Resp 16 06/05/22 1305  SpO2 94 % 06/05/22 1305  Vitals shown include unvalidated device data.  Last Pain:  Vitals:   06/05/22 1245  TempSrc:   PainSc: 0-No pain      Patients Stated Pain Goal: 3 (75/79/72 8206)  Complications: No notable events documented.

## 2022-06-05 NOTE — Interval H&P Note (Signed)
History and Physical Interval Note:  06/05/2022 7:21 AM  [name]  has presented today for surgery, with the diagnosis of C3-4 stenosis.  The various methods of treatment have been discussed with the patient and family. After consideration of risks, benefits and other options for treatment, the patient has consented to  Procedure(s): C3-4 ANTERIOR CERVICAL DISCECTOMY FUSION, ALLOGRAFT, PLATE (N/A) as a surgical intervention.  The patient's history has been reviewed, patient examined, no change in status, stable for surgery.  I have reviewed the patient's chart and labs.  Questions were answered to the patient's satisfaction.     Marybelle Killings

## 2022-06-05 NOTE — Anesthesia Postprocedure Evaluation (Signed)
Anesthesia Post Note  Patient: [name]  Procedure(s) Performed: C3-4 ANTERIOR CERVICAL DISCECTOMY FUSION, ALLOGRAFT, PLATE     Patient location during evaluation: PACU Anesthesia Type: General Level of consciousness: awake and alert, oriented and patient cooperative Pain management: pain level controlled Vital Signs Assessment: post-procedure vital signs reviewed and stable Respiratory status: spontaneous breathing, nonlabored ventilation and respiratory function stable Cardiovascular status: blood pressure returned to baseline and stable Postop Assessment: no apparent nausea or vomiting Anesthetic complications: no   No notable events documented.  Last Vitals:  Vitals:   06/05/22 1315 06/05/22 1348  BP: 117/82 114/79  Pulse: 89 82  Resp: 14 12  Temp:    SpO2: 91% 94%    Last Pain:  Vitals:   06/05/22 1404  TempSrc:   PainSc: [place]

## 2022-06-05 NOTE — Op Note (Signed)
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  Surgeon: Rodell Perna, MD  Assistant: Benjiman Core, PA-C medically necessary and present for the entire procedure  Anesthesia General orotracheal +6 cc Marcaine local.  Drains 1 Hemovac neck  EBL 200 cc  Implants:Implants  BONE CC-ACS 11X14X9 6D - [id number]  Inventory Item: BONE CC-ACS 11X14X9 6D Serial no.: [id number] Model/Cat no.: [id number]  Implant name: BONE CC-ACS [id number] 6D - [id number] Laterality: N/A Area: Spine Cervical  Manufacturer: Gerilyn Nestle FNDN Date of Manufacture:   Action: Implanted Number Used: 1   Device Identifier: Device Identifier Type:   SCREW XTD VAR 4.2 SELF TAP 12 - [id number]  Inventory Item: SCREW XTD VAR 4.2 SELF TAP 12 Serial no.: Model/Cat no.: [id number]  Implant name: SCREW XTD VAR 4.2 SELF TAP 12 - [id number] Laterality: N/A Area: Spine Cervical  Manufacturer: [place] Date of Manufacture:   Action: Implanted and Explanted Number Used: 1   Device Identifier: Device Identifier Type:   PLATE ANT CERV XTEND 1 LV 16 - [id number]  Inventory Item: PLATE ANT CERV XTEND 1 LV 16 Serial no.: Model/Cat no.: [id number]  Implant name: PLATE ANT CERV XTEND 1 LV 16 - [id number] Laterality: N/A Area: Spine Cervical  Manufacturer: [place] Date of Manufacture:   Action: Implanted Number Used: 1   Device Identifier: Device Identifier Type:   SCREW XTEND SELFTAP VAR 4.6X14 - [id number]   Inventory Item: SCREW XTEND SELFTAP VAR 4.6X14 Serial no.: Model/Cat no.: [id number]  Implant name: Lorelle [place] [facility] VAR 9.3O67 - [id number] Laterality: N/A Area: Spine Cervical  Manufacturer: [place] Date of Manufacture:   Action: Implanted Number Used: 4   Device Identifier: Device Identifier Type:     Procedure: After induction of general anesthesia with a glide scope intubation had ultra  traction application without weight arms tucked to the side with yellow pads Bumpers warming blanket wrist restraints for pulldown for intermittent use of C arm during the case for visualization neck was prepped with DuraPrep sterile skin marker and Betadine Steri-Drape sterile Mayo stand at the head thyroid sheets and drapes were applied.  Ancef was given prophylactically timeout procedure was completed.  Incision was made starting to midline extending the left platysma was divided.  Medium Gelpi placed subperiosteal dissection at the spur C3-4 blunt dissection prominent spur.  Short 25 needle was placed in the disc space confirmed with lateral sterilely draped C arm image we were at the appropriate level.  Spurs removed with the bare rongeurs as well as the bur self-retaining retractors were placed with some difficulty due to the patient short neck increased BMI.  Teeth retractors were used right and left and smooth blade cephalad caudad.  Spurs removed anteriorly.  We progressed back intermittently him to take a spot image with the trial.  AP distance was 18 mm.  Once within the posterior cortex operative microscope was brought in and we did take down to the posterior bone.  A 9 graft was selected and we took more below the disc space at C4 since that was areas of more compression than above.  By MRI once were 2 mm to 3 mm above the disc space there was no significant compression but below were patient had 2 previous [place] procedures at C4-5 C5-6 she had 1 large silent solid plug of bone but there was more stenosis.  Dura was completely decompressed there was intermittent bleeding then stop multiple times  to add Surgiflo and patties bone wax was used intermittently bone wax was removed for finally the 9 mm graft was selected.  CRNA pulled traction as we placed the graft was able to be placed flush with the anterior cortex countersunk and then we took remaining bone below at C4 where it was countersunk at C4  by about 3 mm and flush with the anterior cortex of C3 there is already had the spurs anteriorly removed.  Plate was selected.  Bone was soft and we had to angle the plate slightly in order to get the screws to hold even with rescue screws and we used some 14 screws rather than 16 due to the patient's prominent spurring and increased diameter from anterior to posterior.  Plate was secure C-arm was used to visualize.  All screws were in bone.  Tiny screwdriver was used to lock and then Hemovacs placed in and out technique on the left side.  Platysma closed with 0 Vicryl for Vicryl septic closure tincture benzoin Steri-Strips 6 cc Marcaine infiltration and soft collar.  Outpatient surgery is appropriate for treatment of this condition.

## 2022-06-05 NOTE — Discharge Instructions (Signed)
You have the extra collar that she will apply after the wrap with Saran wrap you can take off the dry collar and apply Saran wrap collar when you take a shower.  After get out of the shower dry off and then without moving her neck switch back to the dry collar.  You will do better if you are in a beachchair recliner type position for a week or so to help with neck swelling.  Stay with soft foods and liquids until the swelling in your neck goes down you can swallow easily.  See Dr. Lorin Mercy in 1 week.  You do not need to change her dressing.  If for some reason the dressing comes off you can apply gauze and tape.  See Dr. Lorin Mercy in 1 week.

## 2022-06-06 DIAGNOSIS — M4722 Other spondylosis with radiculopathy, cervical region: Secondary | ICD-10-CM | POA: Diagnosis not present

## 2022-06-06 NOTE — Discharge Summary (Signed)
Patient ID: [name] MRN: [id number] DOB/AGE: [birth year] 76 y.o.  Admit date: 06/05/2022 Discharge date: 06/06/2022  Admission Diagnoses:  Cervical stenosis of spine  Discharge Diagnoses:  Principal Problem:   Cervical stenosis of spine   Past Medical History:  Diagnosis Date   Abdominal pain    around naval   Anxiety    Arthritis    Bipolar disorder ([place])    Breast pain in female    Bruises easily    Cancer ([place]) 2003   left lung   Cervical disc disease    Chronic kidney disease    COPD (chronic obstructive pulmonary disease) ([place])    O2 as needed.   Depression    DJD (degenerative joint disease)    Dyspnea    uses O2 via  2-3L PRN   Gum disease    Hernia    Hypoglycemic episode in patient with diabetes mellitus ([place]) 2021   Diet controlled diabetes with occasional hypoglycemia at night   Hypothyroidism    no meds   IDA (iron deficiency anemia) 03/27/2021   in [place]   Insomnia    not currently a problem   Phlebitis    Pneumonia 03/2022   mild case   Pre-diabetes    diet controlled, no meds   Thyroid disease    Walker as ambulation aid    Wears glasses    Weight increase     Surgeries: Procedure(s): C3-4 ANTERIOR CERVICAL DISCECTOMY FUSION, ALLOGRAFT, PLATE on 63/87/5643   Consultants (if any):   Discharged Condition: Improved  Hospital Course: [name] is an 76 y.o. female who was admitted 06/05/2022 with a diagnosis of Cervical stenosis of spine and went to the operating room on 06/05/2022 and underwent the above named procedures.    She was given perioperative antibiotics:  Anti-infectives (From admission, onward)    Start     Dose/Rate Route Frequency Ordered Stop   06/05/22 0600  ceFAZolin (ANCEF) IVPB 2g/100 mL premix        2 g 200 mL/hr over 30 Minutes Intravenous On call to O.R. 06/05/22 3295 06/05/22 0754     .  She was given sequential compression devices, early ambulation, and appropriate  chemoprophylaxis for DVT prophylaxis.  She benefited maximally from the hospital stay and there were no complications.    Recent vital signs:  Vitals:   06/06/22 0006 06/06/22 0335  BP: 103/79 109/66  Pulse: 69 75  Resp: 18   Temp: 97.6 F (36.4 C) 97.7 F (36.5 C)  SpO2: 98% 97%    Recent laboratory studies:  Lab Results  Component Value Date   HGB 15.6 (H) 05/28/2022   HGB 14.1 10/30/2020   HGB 14.8 05/31/2020   Lab Results  Component Value Date   WBC 6.0 05/28/2022   PLT 187 05/28/2022   Lab Results  Component Value Date   INR 1.07 09/23/2016   Lab Results  Component Value Date   NA 145 (H) 05/12/2022   K 4.7 05/12/2022   CL 103 05/12/2022   CO2 26 05/12/2022   BUN 17 05/12/2022   CREATININE 1.27 (H) 05/12/2022   GLUCOSE 76 05/12/2022    Discharge Medications:   Allergies as of 06/06/2022   No Known Allergies      Medication List     STOP taking these medications    acetaminophen 650 MG CR tablet Commonly known as: TYLENOL   tiZANidine 4 MG tablet Commonly known as: ZANAFLEX  TAKE these medications    acidophilus Caps capsule Take 1 capsule by mouth daily.   albuterol (2.5 MG/3ML) 0.083% nebulizer solution Commonly known as: PROVENTIL USE 1 VIAL IN NEBULIZER EVERY 8 HOURS AS NEEDED FOR WHEEZING OR SHORTNESS OF BREATH   albuterol 108 (90 Base) MCG/ACT inhaler Commonly known as: VENTOLIN HFA TAKE 2 PUFFS BY MOUTH EVERY 6 HOURS AS NEEDED FOR WHEEZE OR SHORTNESS OF BREATH   aspirin EC 81 MG tablet Take 1 tablet (81 mg total) by mouth daily. Swallow whole.   B-12 5000 MCG Caps Take 5,000 mcg by mouth daily.   cetirizine 10 MG tablet Commonly known as: ZYRTEC Take 10 mg by mouth daily.   chlorpheniramine-HYDROcodone 10-8 MG/5ML Commonly known as: Tussionex Pennkinetic ER Take 5 mLs by mouth every 12 (twelve) hours as needed for cough.   Compressor/Nebulizer Misc Use as directed   diphenhydrAMINE 12.5 MG/5ML  liquid Commonly known as: BENADRYL 25 mg every 6 (six) hours as needed for allergies or itching.   FreeStyle [employer] Test test strip Generic drug: glucose blood AS DIRECTED TO OBTAIN BLOOD TWICE A DAY AS NEEDED DX CODE E11.69 90 DAYS   freestyle lancets   furosemide 20 MG tablet Commonly known as: LASIX Take 20 mg by mouth daily as needed for edema or fluid.   gabapentin 100 MG capsule Commonly known as: NEURONTIN Take 200 mg by mouth 3 (three) times daily.   glucose 4 GM chewable tablet Chew 1-3 tablets by mouth as needed for low blood sugar.   HYDROcodone-acetaminophen 10-325 MG tablet Commonly known as: Norco Take 1 tablet by mouth every 6 (six) hours as needed.   ketoconazole 2 % cream Commonly known as: NIZORAL Apply 1 Application topically daily as needed for irritation.   methocarbamol 500 MG tablet Commonly known as: ROBAXIN Take 1 tablet (500 mg total) by mouth every 6 (six) hours as needed for muscle spasms.   nitroGLYCERIN 0.4 MG SL tablet Commonly known as: Nitrostat Place 1 tablet (0.4 mg total) under the tongue every 5 (five) minutes as needed for chest pain. If you require more than two tablets five minutes apart go to the nearest ER via EMS.   olmesartan-hydrochlorothiazide 20-12.5 MG tablet Commonly known as: BENICAR HCT TAKE 1 TABLET BY MOUTH EVERY DAY What changed: when to take this   Prednisolon-Moxiflox-Bromfenac 1-0.5-0.075 % Soln Place 1 drop into the left eye in the morning, at noon, and at bedtime.   rosuvastatin 10 MG tablet Commonly known as: CRESTOR Take 1 tablet (10 mg total) by mouth at bedtime.   Salonpas Pain Relief Patch 3-10 % Ptch Place 1 patch onto the skin daily as needed (pain).   Stiolto Respimat 2.5-2.5 MCG/ACT Aers Generic drug: Tiotropium Bromide-Olodaterol Inhale 2 puffs into the lungs daily.   SYSTANE OP Place 1 drop into both eyes 3 (three) times daily as needed (dry eyes).   trolamine salicylate 10 %  cream Commonly known as: ASPERCREME Apply 1 Application topically as needed for muscle pain.   Vitamin D3 125 MCG (5000 UT) Tabs Take 10,000 Units by mouth daily.        Diagnostic Studies: DG Cervical Spine 2 or 3 views  Result Date: 06/05/2022 CLINICAL DATA:  C3-4 ACDF EXAM: CERVICAL SPINE - 2-3 VIEW COMPARISON:  10/30/2021 FINDINGS: Two C-arm fluoroscopic images were obtained intraoperatively and submitted for post operative interpretation. Images demonstrate interval placement of C2-3 ACDF hardware. 16 seconds of fluoroscopy time utilized. Radiation dose: 2.63 mGy. Please see the performing  provider's procedural report for further detail. IMPRESSION: Intraoperative fluoroscopy provided for C3-4 ACDF. Electronically Signed   By: Davina Poke D.O.   On: 06/05/2022 11:39   DG C-Arm 1-60 Min-No Report  Result Date: 06/05/2022 Fluoroscopy was utilized by the requesting physician.  No radiographic interpretation.   DG C-Arm 1-60 Min-No Report  Result Date: 06/05/2022 Fluoroscopy was utilized by the requesting physician.  No radiographic interpretation.   DG C-Arm 1-60 Min-No Report  Result Date: 06/05/2022 Fluoroscopy was utilized by the requesting physician.  No radiographic interpretation.   CT Angio Chest Pulmonary Embolism (PE) W or WO Contrast  Result Date: 05/14/2022 CLINICAL DATA:  PE suspected. Positive D-dimer. Chest pain. Lower extremity swelling bilaterally for 6 weeks. History of lung cancer with chemotherapy and left lobectomy. History of COPD. EXAM: CT ANGIOGRAPHY CHEST WITH CONTRAST TECHNIQUE: Multidetector CT imaging of the chest was performed using the standard protocol during bolus administration of intravenous contrast. Multiplanar CT image reconstructions and MIPs were obtained to evaluate the vascular anatomy. RADIATION DOSE REDUCTION: This exam was performed according to the departmental dose-optimization program which includes automated exposure control,  adjustment of the mA and/or kV according to patient size and/or use of iterative reconstruction technique. CONTRAST:  14mL ISOVUE-370 IOPAMIDOL (ISOVUE-370) INJECTION 76% COMPARISON:  Chest two views 03/31/2022 and 03/09/2022;CTA chest 11/23/2016, limited coronary calcium CT 12/10/2021 FINDINGS: Cardiovascular: Main pulmonary artery is opacified up to 322 Hounsfield units. No filling defect is seen to indicate an acute pulmonary embolism. Heart size is normal. No pericardial effusion. No thoracic aortic aneurysm. Common origin of the right brachiocephalic and left common carotid arteries off of the aortic arch, a normal variant. No aortic aneurysm. Mediastinum/Nodes: No axillary, mediastinal, or hilar pathologically enlarged lymph nodes by CT criteria. The visualized thyroid is grossly unremarkable. The esophagus follows a normal course and normal caliber. Lungs/Pleura: Postsurgical changes are again seen of left lower lobe resection. The central airways are patent. There are high-grade right upper lobe and left lower lung cystic emphysematous changes again seen. High-grade interstitial thickening and curvilinear scarring within the inferior anterior postsurgical left lung. No suspicious pulmonary nodule is seen. Upper Abdomen: Postsurgical changes are seen within the partially visualized stomach, likely from gastric bypass. Musculoskeletal: Moderate to severe multilevel disc space narrowing and endplate osteophytes throughout the thoracic spine. Healed left lateral fourth rib fracture, possibly from remote postsurgical thoracotomy, unchanged. Review of the MIP images confirms the above findings. IMPRESSION: 1. No pulmonary embolism is seen. 2. Status post left lower lobe resection. No suspicious pulmonary nodule is seen. 3. High-grade right upper lobe and left lower lung cystic emphysematous changes. High-grade scarring within the lateral left lower lung. Emphysema (ICD10-J43.9). Electronically Signed   By: Yvonne Kendall M.D.   On: 05/14/2022 16:07   PCV ECHOCARDIOGRAM COMPLETE  Result Date: 05/13/2022 Echocardiogram 05/13/2022: Study Quality: Technically difficult study. Normal LV systolic function with visual EF 60-65%. Left ventricle cavity is normal in size. Normal left ventricular wall thickness. Normal global wall motion. Normal diastolic filling pattern, normal LAP. No significant valvular heart disease. No prior studies for comparison.   PCV LOWER VENOUS US (BILATERAL)  Result Date: 05/13/2022 Lower Extremity Venous Duplex (Bilateral) 05/13/2022: No evidence of deep vein thrombosis of the lower extremities with normal venous return. There is generalized tissue edema noted.    Disposition: Discharge disposition: 01-Home or Self Care       Discharge Instructions     Call MD / Call 911   Complete by: As  directed    If you experience chest pain or shortness of breath, CALL 911 and be transported to the hospital emergency room.  If you develope a fever above 101.5 F, pus (white drainage) or increased drainage or redness at the wound, or calf pain, call your surgeon's office.   Constipation Prevention   Complete by: As directed    Drink plenty of fluids.  Prune juice may be helpful.  You may use a stool softener, such as Colace (over the counter) 100 mg twice a day.  Use MiraLax (over the counter) for constipation as needed.   Driving restrictions   Complete by: As directed    No driving while taking narcotic pain meds.   Incentive spirometry RT   Complete by: As directed    Increase activity slowly as tolerated   Complete by: As directed    Post-operative opioid taper instructions:   Complete by: As directed    POST-OPERATIVE OPIOID TAPER INSTRUCTIONS: It is important to wean off of your opioid medication as soon as possible. If you do not need pain medication after your surgery it is ok to stop day one. Opioids include: Codeine, Hydrocodone(Norco, Vicodin), Oxycodone(Percocet,  oxycontin) and hydromorphone amongst others.  Long term and even short term use of opiods can cause: Increased pain response Dependence Constipation Depression Respiratory depression And more.  Withdrawal symptoms can include Flu like symptoms Nausea, vomiting And more Techniques to manage these symptoms Hydrate well Eat regular healthy meals Stay active Use relaxation techniques(deep breathing, meditating, yoga) Do Not substitute Alcohol to help with tapering If you have been on opioids for less than two weeks and do not have pain than it is ok to stop all together.  Plan to wean off of opioids This plan should start within one week post op of your joint replacement. Maintain the same interval or time between taking each dose and first decrease the dose.  Cut the total daily intake of opioids by one tablet each day Next start to increase the time between doses. The last dose that should be eliminated is the evening dose.           Follow-up Information     Marybelle Killings, MD. Schedule an appointment as soon as possible for a visit.   Specialty: Orthopedic Surgery Why: needs return office visit with dr yates one week postop Contact information: [address] [place] [address] [phone]                  Signed: Eduard Roux 06/06/2022, 8:36 AM

## 2022-06-06 NOTE — Plan of Care (Signed)
  Problem: Clinical Measurements: Goal: Ability to maintain clinical measurements within normal limits will improve Outcome: Progressing   

## 2022-06-06 NOTE — Progress Notes (Signed)
  Transition of Care (TOC) Screening Note   Patient Details  Name: Darby Fleeman [place] Date of Birth: [birth year]   Transition of Care [facility]) CM/SW Contact:    Bartholomew Crews, RN Phone Number: [phone] 06/06/2022, 7:33 AM  S/p C3-4 anterior cervical discectomy and fusion, allograft and plate  - OT eval pending  Transition of Care Department (TOC) has reviewed patient and no TOC needs have been identified at this time. We will continue to monitor patient advancement through interdisciplinary progression rounds. If new patient transition needs arise, please place a TOC consult.

## 2022-06-06 NOTE — Progress Notes (Signed)
   Subjective:  Patient reports pain as mild.  No complaints.  Ready to go home.  Objective:   VITALS:   Vitals:   06/05/22 2130 06/06/22 0000 06/06/22 0006 06/06/22 0335  BP: 103/69  103/79 109/66  Pulse: 68 76 69 75  Resp: 16  18   Temp:   97.6 F (36.4 C) 97.7 F (36.5 C)  TempSrc:   Oral Oral  SpO2: 97% 92% 98% 97%  Weight:      Height:        Neurologically intact Neurovascular intact Sensation intact distally Intact pulses distally Incision: dressing C/D/I and no drainage   Lab Results  Component Value Date   WBC 6.0 05/28/2022   HGB 15.6 (H) 05/28/2022   HCT 49.6 (H) 05/28/2022   MCV 86.4 05/28/2022   PLT 187 05/28/2022     Assessment/Plan:  1 Day Post-Op   - mobilizing in the room well independently - pain controlled - d/c home today  Eduard Roux 06/06/2022, 8:35 AM

## 2022-06-06 NOTE — Progress Notes (Signed)
AVS given and explained to patient,waiting for ride.

## 2022-06-06 NOTE — Evaluation (Signed)
Occupational Therapy Evaluation Patient Details Name: [name] MRN: [id number] DOB: [birth year] Today's Date: 06/06/2022   History of Present Illness [name] is an 76 y.o. female who was admitted 06/05/2022 with a diagnosis of Cervical stenosis of spine and went to the operating room on 06/05/2022 and underwent C3-4 ANTERIOR CERVICAL DISCECTOMY FUSION, ALLOGRAFT, PLATE   Clinical Impression   Pt presents with decline in function and safety with ADLs following procedure above. Pt Mod I with mobility and transfers (slow pace pf movement) requires mod A with LB ADLs and will have assist at home form he family. PTA pt lived at home with her daughter and son in law in a 2 level home with level entry, 16 steps to climb for seconds level to bedroom and full bath. Pt has a high toilet and tub shower. Pt educated on  cervical precautions/body mechanics, ADL compensatory techniques ADL A/E, home mobility safety. All education is completed and no further acute OT services are indicated at this time. Pt scheduled to d/c home today with family     Recommendations for follow up therapy are one component of a multi-disciplinary discharge planning process, led by the attending physician.  Recommendations may be updated based on patient status, additional functional criteria and insurance authorization.   Follow Up Recommendations  No OT follow up     Assistance Recommended at Discharge Intermittent Supervision/Assistance  Patient can return home with the following A little help with bathing/dressing/bathroom;Assistance with cooking/housework;Help with stairs or ramp for entrance;Assist for transportation    Functional Status Assessment  Patient has had a recent decline in their functional status and demonstrates the ability to make significant improvements in function in a reasonable and predictable amount of time.  Equipment Recommendations  Tub/shower bench;Other (comment)  (reacher, long handle bath sponge)    Recommendations for Other Services       Precautions / Restrictions Precautions Precautions: Cervical Precaution Booklet Issued: Yes (comment) Required Braces or Orthoses: Cervical Brace Cervical Brace: Soft collar;At all times Restrictions Weight Bearing Restrictions: No      Mobility Bed Mobility Overal bed mobility: Modified Independent             General bed mobility comments: slower pace, no physical assist required    Transfers Overall transfer level: Modified independent                 General transfer comment: slower pace, no physical assist required      Balance Overall balance assessment: Modified Independent                                         ADL either performed or assessed with clinical judgement   ADL Overall ADL's : Needs assistance/impaired Eating/Feeding: Independent   Grooming: Wash/dry hands;Wash/dry face;Modified independent   Upper Body Bathing: Set up;Supervision/ safety   Lower Body Bathing: Moderate assistance   Upper Body Dressing : Supervision/safety;Set up   Lower Body Dressing: Moderate assistance   Toilet Transfer: Modified Independent   Toileting- Clothing Manipulation and Hygiene: Supervision/safety       Functional mobility during ADLs: Modified independent General ADL Comments: pt instructed on ADL A/E, cervical precautions. Pt verbalizes understanding and reports that her daughter can assist her with selfcare     Vision Baseline Vision/History: 1 Wears glasses Ability to See in Adequate Light: 0 Adequate Patient Visual Report: No change from  baseline       Perception     Praxis      Pertinent Vitals/Pain Pain Assessment Pain Assessment: 0-10 Pain Score: 6  Pain Location: R shoulder, discomfor in cervical surgical area Pain Descriptors / Indicators: Sore, Discomfort Pain Intervention(s): Monitored during session, Premedicated before  session, Repositioned     Hand Dominance Right   Extremity/Trunk Assessment Upper Extremity Assessment Upper Extremity Assessment: Overall WFL for tasks assessed       Cervical / Trunk Assessment Cervical / Trunk Assessment: Neck Surgery   Communication Communication Communication: No difficulties   Cognition Arousal/Alertness: Awake/alert Behavior During Therapy: WFL for tasks assessed/performed Overall Cognitive Status: Within Functional Limits for tasks assessed                                       General Comments       Exercises     Shoulder Instructions      Home Living Family/patient expects to be discharged to:: Private residence Living Arrangements: Children Available Help at Discharge: Family;Available PRN/intermittently Type of Home: House Home Access: Level entry     Home Layout: Two level Alternate Level Stairs-Number of Steps: 16   Bathroom Shower/Tub: Teacher, early years/pre: Handicapped height     Home Equipment: None          Prior Functioning/Environment Prior Level of Function : Independent/Modified Independent             Mobility Comments: no AD for mobility ADLs Comments: Ind with ADLs/selfcare, ADLs, cooking        OT Problem List: Decreased activity tolerance;Pain;Decreased knowledge of use of DME or AE      OT Treatment/Interventions:      OT Goals(Current goals can be found in the care plan section) Acute Rehab OT Goals Patient Stated Goal: go home  OT Frequency:      Co-evaluation              AM-PAC OT "6 Clicks" Daily Activity     Outcome Measure Help from another person eating meals?: None Help from another person taking care of personal grooming?: None Help from another person toileting, which includes using toliet, bedpan, or urinal?: None Help from another person bathing (including washing, rinsing, drying)?: A Little Help from another person to put on and taking off  regular upper body clothing?: A Little Help from another person to put on and taking off regular lower body clothing?: A Little 6 Click Score: 21   End of Session Equipment Utilized During Treatment: Cervical collar (soft collar)  Activity Tolerance: Patient tolerated treatment well Patient left: in chair  OT Visit Diagnosis: Pain Pain - Right/Left: Right Pain - part of body: Shoulder (cervical surgical site)                Time: 1607-3710 OT Time Calculation (min): 42 min Charges:  OT General Charges $OT Visit: 1 Visit OT Evaluation $OT Eval Low Complexity: 1 Low OT Treatments $Self Care/Home Management : 8-22 mins $Therapeutic Activity: 8-22 mins    Britt Bottom 06/06/2022, 10:43 AM

## 2022-06-06 NOTE — Plan of Care (Signed)

## 2022-06-08 ENCOUNTER — Encounter (HOSPITAL_COMMUNITY): Payer: Self-pay | Admitting: Orthopaedic Surgery

## 2022-06-10 ENCOUNTER — Ambulatory Visit (INDEPENDENT_AMBULATORY_CARE_PROVIDER_SITE_OTHER): Payer: Medicare Other | Admitting: Orthopaedic Surgery

## 2022-06-10 ENCOUNTER — Ambulatory Visit (INDEPENDENT_AMBULATORY_CARE_PROVIDER_SITE_OTHER): Payer: Medicare Other

## 2022-06-10 ENCOUNTER — Encounter: Payer: Self-pay | Admitting: Orthopaedic Surgery

## 2022-06-10 VITALS — BP 137/81 | HR 70 | Ht 60.0 in | Wt 227.0 lb

## 2022-06-10 DIAGNOSIS — Z981 Arthrodesis status: Secondary | ICD-10-CM

## 2022-06-10 MED ORDER — OXYCODONE-ACETAMINOPHEN 5-325 MG PO TABS: 1.0000 | ORAL_TABLET | ORAL | 0 refills | Status: DC | PRN

## 2022-06-10 NOTE — Progress Notes (Signed)
Post-Op Visit Note   Patient: [name]           Date of Birth: July 24, 1945           MRN: [id number] Visit Date: 06/10/2022 PCP: Antony Blackbird, MD   Assessment & Plan: Postop C3-4 ACDF for spinal stenosis severe on the right side.  She is continuing to have pain in her shoulder radiates down to her elbow.  She is on Norco 10/325 1 every 6 hours she states is really not working well.  Continue methocarbamol we will try some Percocet 5/325 1 or 2 tablets every 4 hours as needed for pain.  She has some deltoid weakness on the right side which may be related to pulldown for visualization with fluoroscopy.  We will recheck her again in 5 weeks.  Steri-Strips changed new collar covers given.  On return visit lateral flexion-extension C-spine x-rays on return.  Chief Complaint:  Chief Complaint  Patient presents with   Neck - Routine Post Op    06/05/2022 C3-4 ACDF   Visit Diagnoses:  1. Status post cervical spinal fusion     Plan:   Follow-Up Instructions: No follow-ups on file.   Orders:  Orders Placed This Encounter  Procedures   XR Cervical Spine 2 or 3 views   No orders of the defined types were placed in this encounter.   Imaging: No results found.  PMFS History: Patient Active Problem List   Diagnosis Date Noted   Cervical stenosis of spine 06/05/2022   Sinusitis, acute maxillary 03/31/2022   Left lower lobe pneumonia 03/31/2022   Change in bowel habit 11/24/2021   Epigastric pain 11/24/2021   Internal hemorrhoids 11/24/2021   Personal history of colonic polyps 11/24/2021   Urticaria 11/24/2021   Spinal stenosis of cervical region 11/05/2021   Pincer nail deformity 09/05/2021   History of diabetes mellitus 03/20/2021   Iron deficiency 03/20/2021   Pruritus 01/09/2021   Pain in left leg 12/23/2020   Prediabetes 11/18/2020   Incarcerated incisional hernia s/p lap repair w mesh 06/06/2020 06/06/2020   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    Polyp of colon 10/23/2019   Chronic respiratory failure with hypoxia ([place]) 04/01/2019   Medication management 02/28/2019   Combined forms of age-related cataract of both eyes 12/01/2018   Early stage nonexudative age-related macular degeneration of both eyes 12/01/2018   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   PVD (posterior vitreous detachment), left 11/28/2018   Dysphagia 12/30/2017   Asthma 04/12/2017   BMI 40.0-44.9, adult ([place]) 04/12/2017   Irritable bowel syndrome 04/12/2017   Osteopenia 62/26/3335   Umbilical hernia without obstruction and without gangrene 03/18/2017   Hiatal hernia 01/26/2017   Vitamin D deficiency 06/16/2016   Encounter for preoperative examination for general surgical procedure 03/06/2016   Hypercholesterolemia 02/25/2016   Hypertensive disorder 02/25/2016   Cough 08/02/2015   Obstructive sleep apnea 08/06/2013   Neoplasm of breast 07/20/2013   COPD with acute bronchitis ([place]) 06/18/2013   Myalgia 06/18/2013   History of laparoscopic adjustable gastric banding, APS. 09/04/2008. 04/08/2011   History of lung cancer 06/27/2010   SKIN RASH 12/13/2009   DYSPNEA 07/05/2008   DIABETES, TYPE 2 07/01/2008   Obesity, morbid ([place]) 07/01/2008   Type 2 diabetes mellitus without complications ([place]) 45/62/5638   COPD mixed type ([place]) 06/26/2008   Malignant neoplasm of lung ([place]) 07/20/2001   Past Medical History:  Diagnosis Date   Abdominal pain    around [employer]  Anxiety    Arthritis    Bipolar disorder ([place])    Breast pain in female    Bruises easily    Cancer ([place]) 2003   left lung   Cervical disc disease    Chronic kidney disease    COPD (chronic obstructive pulmonary disease) ([place])    O2 as needed.   Depression    DJD (degenerative joint disease)    Dyspnea    uses O2 via [place] 2-3L PRN   Gum disease    Hernia    Hypoglycemic episode in patient with diabetes mellitus ([place]) 2021   Diet controlled diabetes with occasional hypoglycemia at night    Hypothyroidism    no meds   IDA (iron deficiency anemia) 03/27/2021   in [place]   Insomnia    not currently a problem   Phlebitis    Pneumonia 03/2022   mild case   Pre-diabetes    diet controlled, no meds   Thyroid disease    Walker as ambulation aid    Wears glasses    Weight increase     Family History  Problem Relation Age of Onset   Stroke Mother    Stroke Father     Past Surgical History:  Procedure Laterality Date   ABDOMINAL HYSTERECTOMY     partial   ANTERIOR CERVICAL DECOMP/DISCECTOMY FUSION N/A 06/05/2022   Procedure: C3-4 ANTERIOR CERVICAL DISCECTOMY FUSION, ALLOGRAFT, PLATE;  Surgeon: Marybelle Killings, MD;  Location: [facility];  Service: Orthopedics;  Laterality: N/A;   CATARACT EXTRACTION  04/2022   FOOT SURGERY     right   HERNIA REPAIR     LAPAROSCOPIC GASTRIC BANDING  08/2008   LAPAROSCOPIC ROUX-EN-Y GASTRIC BYPASS WITH UPPER ENDOSCOPY AND REMOVAL OF LAP BAND  2018   LEFT HEART CATHETERIZATION WITH CORONARY ANGIOGRAM N/A 05/02/2012   Procedure: LEFT HEART CATHETERIZATION WITH CORONARY ANGIOGRAM;  Surgeon: Laverda Page, MD;  Location[facility] C[facility] LAB;  Service: Cardiovascular;  Laterality: N/A;   LUNG LOBECTOMY  2003   left   MASS EXCISION Right 06/06/2020   Procedure: REMOVAL MASS ON RIGHT BUTTOCK;  Surgeon: Michael Boston, MD;  Location: [facility];  Service: General;  Laterality: Right;   SPINE SURGERY     fusion on the neck   THYROIDECTOMY     partial   TONSILLECTOMY     VENTRAL HERNIA REPAIR N/A 06/06/2020   Procedure: [place] WITH MESH AND TAP BLOCK;  Surgeon: Michael Boston, MD;  [facility];  Service: General;  Laterality: N/A;   Social History   Occupational History   Not on file  Tobacco Use   Smoking status: Former    Packs/day: 2.00    Years: 37.00    Total pack years: 74.00    Types: Cigarettes    Quit date: 06/21/2002    Years since quitting: 19.9   Smokeless tobacco: Never  Vaping Use   Vaping  Use: Never used  Substance and Sexual Activity   Alcohol use: Yes    Comment: [place] Liquor/WIne   Drug use: No   Sexual activity: Not on file    Comment: Hysterectomy

## 2022-06-16 ENCOUNTER — Encounter: Payer: Medicare Other | Admitting: Orthopaedic Surgery

## 2022-06-18 ENCOUNTER — Telehealth: Payer: Self-pay | Admitting: Orthopaedic Surgery

## 2022-06-18 NOTE — Telephone Encounter (Signed)
I called patient. Arm is weak, trouble swallowing, pain mid back. Worked her in to be seen tomorrow afternoon.

## 2022-06-18 NOTE — Telephone Encounter (Signed)
Patient is experiencing some issues and would like to speak to someone. [phone], this is patients home number

## 2022-06-19 ENCOUNTER — Encounter: Payer: Self-pay | Admitting: Orthopaedic Surgery

## 2022-06-19 ENCOUNTER — Ambulatory Visit (INDEPENDENT_AMBULATORY_CARE_PROVIDER_SITE_OTHER): Payer: Medicare Other | Admitting: Orthopaedic Surgery

## 2022-06-19 VITALS — BP 114/72 | HR 68 | Ht 60.0 in | Wt 227.0 lb

## 2022-06-19 DIAGNOSIS — Z981 Arthrodesis status: Secondary | ICD-10-CM

## 2022-06-19 MED ORDER — GABAPENTIN 100 MG PO CAPS: 300.0000 mg | ORAL_CAPSULE | Freq: Three times a day (TID) | ORAL | 1 refills | Status: AC

## 2022-06-19 NOTE — Progress Notes (Unsigned)
Post-Op Visit Note   Patient: [name]           Date of Birth: October 09, 1945           MRN: [id number] Visit Date: 06/19/2022 PCP: Antony Blackbird, MD   Assessment & Plan:  Chief Complaint:  Chief Complaint  Patient presents with   Neck - Pain     06/05/2022 C3-4 ACDF     Visit Diagnoses: No diagnosis found.  Plan: ***  Follow-Up Instructions: No follow-ups on file.   Orders:  No orders of the defined types were placed in this encounter.  No orders of the defined types were placed in this encounter.   Imaging: No results found.  PMFS History: Patient Active Problem List   Diagnosis Date Noted   Cervical stenosis of spine 06/05/2022   Sinusitis, acute maxillary 03/31/2022   Left lower lobe pneumonia 03/31/2022   Change in bowel habit 11/24/2021   Epigastric pain 11/24/2021   Internal hemorrhoids 11/24/2021   Personal history of colonic polyps 11/24/2021   Urticaria 11/24/2021   Spinal stenosis of cervical region 11/05/2021   Pincer nail deformity 09/05/2021   History of diabetes mellitus 03/20/2021   Iron deficiency 03/20/2021   Pruritus 01/09/2021   Pain in left leg 12/23/2020   Prediabetes 11/18/2020   Incarcerated incisional hernia s/p lap repair w mesh 06/06/2020 06/06/2020   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   Polyp of colon 10/23/2019   Chronic respiratory failure with hypoxia ([place]) 04/01/2019   Medication management 02/28/2019   Combined forms of age-related cataract of both eyes 12/01/2018   Early stage nonexudative age-related macular degeneration of both eyes 12/01/2018   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   PVD (posterior vitreous detachment), left 11/28/2018   Dysphagia 12/30/2017   Asthma 04/12/2017   BMI 40.0-44.9, adult ([place]) 04/12/2017   Irritable bowel syndrome 04/12/2017   Osteopenia 34/28/7681   Umbilical hernia without obstruction and without gangrene 03/18/2017   Hiatal hernia 01/26/2017   Vitamin D  deficiency 06/16/2016   Encounter for preoperative examination for general surgical procedure 03/06/2016   Hypercholesterolemia 02/25/2016   Hypertensive disorder 02/25/2016   Cough 08/02/2015   Obstructive sleep apnea 08/06/2013   Neoplasm of breast 07/20/2013   COPD with acute bronchitis ([place]) 06/18/2013   Myalgia 06/18/2013   History of laparoscopic adjustable gastric banding, APS. 09/04/2008. 04/08/2011   History of lung cancer 06/27/2010   SKIN RASH 12/13/2009   DYSPNEA 07/05/2008   DIABETES, TYPE 2 07/01/2008   Obesity, morbid ([place]) 07/01/2008   Type 2 diabetes mellitus without complications ([place]) 15/72/6203   COPD mixed type ([place]) 06/26/2008   Malignant neoplasm of lung ([place]) 07/20/2001   Past Medical History:  Diagnosis Date   Abdominal pain    around naval   Anxiety    Arthritis    Bipolar disorder ([place])    Breast pain in female    Bruises easily    Cancer ([place]) 2003   left lung   Cervical disc disease    Chronic kidney disease    COPD (chronic obstructive pulmonary disease) ([place])    O2 as needed.   Depression    DJD (degenerative joint disease)    Dyspnea    uses O2 via [place] 2-3L PRN   Gum disease    Hernia    Hypoglycemic episode in patient with diabetes mellitus ([place]) 2021   Diet controlled diabetes with occasional hypoglycemia at night   Hypothyroidism  no meds   IDA (iron deficiency anemia) 03/27/2021   in [place]   Insomnia    not currently a problem   Phlebitis    Pneumonia 03/2022   mild case   Pre-diabetes    diet controlled, no meds   Thyroid disease    Walker as ambulation aid    Wears glasses    Weight increase     Family History  Problem Relation Age of Onset   Stroke Mother    Stroke Father     Past Surgical History:  Procedure Laterality Date   ABDOMINAL HYSTERECTOMY     partial   ANTERIOR CERVICAL DECOMP/DISCECTOMY FUSION N/A 06/05/2022   Procedure: C3-4 ANTERIOR CERVICAL DISCECTOMY FUSION, ALLOGRAFT, PLATE;  Surgeon: Marybelle Killings, MD;  Location: [facility];  Service: Orthopedics;  Laterality: N/A;   CATARACT EXTRACTION  04/2022   FOOT SURGERY     right   HERNIA REPAIR     LAPAROSCOPIC GASTRIC BANDING  08/2008   LAPAROSCOPIC ROUX-EN-Y GASTRIC BYPASS WITH UPPER ENDOSCOPY AND REMOVAL OF LAP BAND  2018   LEFT HEART CATHETERIZATION WITH CORONARY ANGIOGRAM N/A 05/02/2012   Procedure: LEFT HEART CATHETERIZATION WITH CORONARY ANGIOGRAM;  Surgeon: Laverda Page, MD;  Location: [facility] CATH LAB;  Service: Cardiovascular;  Laterality: N/A;   LUNG LOBECTOMY  2003   left   MASS EXCISION Right 06/06/2020   Procedure: REMOVAL MASS ON RIGHT BUTTOCK;  Surgeon: Michael Boston, MD;  [facility];  Service: General;  Laterality: Right;   SPINE SURGERY     fusion on the neck   THYROIDECTOMY     partial   TONSILLECTOMY     VENTRAL HERNIA REPAIR N/A 06/06/2020   Procedure: [place] WITH MESH AND TAP BLOCK;  Surgeon: Michael Boston, MD;  [facility];  Service: General;  Laterality: N/A;   Social History   Occupational History   Not on file  Tobacco Use   Smoking status: Former    Packs/day: 2.00    Years: 37.00    Total pack years: 74.00    Types: Cigarettes    Quit date: 06/21/2002    Years since quitting: 20.0   Smokeless tobacco: Never  Vaping Use   Vaping Use: Never used  Substance and Sexual Activity   Alcohol use: Yes    Comment: [employer] Liquor/WIne   Drug use: No   Sexual activity: Not on file    Comment: Hysterectomy

## 2022-06-21 DIAGNOSIS — Z981 Arthrodesis status: Secondary | ICD-10-CM | POA: Insufficient documentation

## 2022-06-23 ENCOUNTER — Telehealth: Payer: Self-pay | Admitting: Internal Medicine

## 2022-06-24 MED ORDER — TRELEGY ELLIPTA 100-62.5-25 MCG/ACT IN AEPB: 1.0000 | INHALATION_SPRAY | Freq: Every day | RESPIRATORY_TRACT | 1 refills | Status: DC

## 2022-06-24 NOTE — Telephone Encounter (Signed)
Called and spoke with patient. She stated she was given a trelegy sample and got told if it worked for her to give [place] a call and we send in a prescription for her. Patient verified pharmacy. Prescription has been sent to the pharmacy for patient.   Nothing further needed.

## 2022-07-01 ENCOUNTER — Telehealth: Payer: Self-pay | Admitting: Internal Medicine

## 2022-07-01 NOTE — Telephone Encounter (Signed)
Called and spoke with patient. Advised pt Trelegy is supposed to be 1 puff daily. She verbalized understanding. Nothing further needed at this time.

## 2022-07-09 ENCOUNTER — Ambulatory Visit: Payer: Medicare Other | Admitting: Cardiology

## 2022-07-15 ENCOUNTER — Encounter: Payer: Medicare Other | Admitting: Surgery

## 2022-07-17 ENCOUNTER — Telehealth: Payer: Self-pay | Admitting: Orthopaedic Surgery

## 2022-07-17 NOTE — Telephone Encounter (Signed)
Holding for [employer]

## 2022-07-17 NOTE — Telephone Encounter (Signed)
Patients appointment was moved because Dr Lorin Mercy will not be in office on Jan 5 and she said she can not wait that long. Please call the pt to advise [phone]

## 2022-07-21 ENCOUNTER — Encounter: Payer: Medicare Other | Admitting: Surgery

## 2022-07-21 ENCOUNTER — Telehealth: Payer: Self-pay | Admitting: Orthopaedic Surgery

## 2022-07-21 NOTE — Telephone Encounter (Signed)
Patient states she needs to be seen asap her appt is still in her neck brace and she states, her appt was cancelled and she is not very happy that her appt was cancelled and she is not wanting to stay in this neck brace till the 16th of January. Patient is not happy with this experience. Please advise..[phone]

## 2022-07-22 ENCOUNTER — Ambulatory Visit (INDEPENDENT_AMBULATORY_CARE_PROVIDER_SITE_OTHER): Payer: Medicare Other

## 2022-07-22 ENCOUNTER — Encounter: Payer: Self-pay | Admitting: Orthopaedic Surgery

## 2022-07-22 ENCOUNTER — Ambulatory Visit (INDEPENDENT_AMBULATORY_CARE_PROVIDER_SITE_OTHER): Payer: Medicare Other | Admitting: Orthopaedic Surgery

## 2022-07-22 VITALS — BP 122/75 | HR 79 | Ht 60.0 in | Wt 227.0 lb

## 2022-07-22 DIAGNOSIS — Z981 Arthrodesis status: Secondary | ICD-10-CM

## 2022-07-22 NOTE — Progress Notes (Signed)
Office Visit Note   Patient: [name]           Date of Birth: [birth year]           MRN: [id number] Visit Date: 07/22/2022              Requested by: Antony Blackbird, MD [address] [place],  [place] [address] PCP: Antony Blackbird, MD   Assessment & Plan: Visit Diagnoses:  1. S/P cervical spinal fusion     Plan: Follow-up C3-cervical fusion Plate.  No Motion on Flexion-Extension X-Rays show no motion.  Discontinue collar.  She has times when she has trouble reaching her right arm up overhead with the weakness.  She is never had imaging studies for her rotator cuff on the right.  Cardinal leaf of tingling and numbness in her hand.  She has had a recent upper respiratory infection has COPD asthma.  She can follow-up in a couple months and we can obtain x-rays of her right shoulder on return visit.  Follow-Up Instructions: Return in about 2 months (around 09/20/2022).   Orders:  Orders Placed This Encounter  Procedures   XR Cervical Spine 2 or 3 views   No orders of the defined types were placed in this encounter.     Procedures: No procedures performed   Clinical Data: No additional findings.   Subjective: Chief Complaint  Patient presents with   Neck - Follow-up, Routine Post Op    06/05/2022 C3-4 ACDF    HPI  Review of Systems   Objective: Vital Signs: BP 122/75   Pulse 79   Ht 5' (1.524 m)   Wt 227 lb (103 kg)   BMI 44.33 kg/m   Physical Exam  Ortho Exam  Specialty Comments:  No specialty comments available.  Imaging: No results found.   PMFS History: Patient Active Problem List   Diagnosis Date Noted   S/P cervical spinal fusion 06/21/2022   Cervical stenosis of spine 06/05/2022   Sinusitis, acute maxillary 03/31/2022   Left lower lobe pneumonia 03/31/2022   Change in bowel habit 11/24/2021   Epigastric pain 11/24/2021   Internal hemorrhoids 11/24/2021   Personal history of colonic polyps 11/24/2021   Urticaria  11/24/2021   Spinal stenosis of cervical region 11/05/2021   Pincer nail deformity 09/05/2021   History of diabetes mellitus 03/20/2021   Iron deficiency 03/20/2021   Pruritus 01/09/2021   Pain in left leg 12/23/2020   Prediabetes 11/18/2020   Incarcerated incisional hernia s/p lap repair w mesh 06/06/2020 06/06/2020   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   Polyp of colon 10/23/2019   Chronic respiratory failure with hypoxia ([place]) 04/01/2019   Medication management 02/28/2019   Combined forms of age-related cataract of both eyes 12/01/2018   Early stage nonexudative age-related macular degeneration of both eyes 12/01/2018   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   PVD (posterior vitreous detachment), left 11/28/2018   Dysphagia 12/30/2017   Asthma 04/12/2017   BMI 40.0-44.9, adult ([place]) 04/12/2017   Irritable bowel syndrome 04/12/2017   Osteopenia 44/81/8563   Umbilical hernia without obstruction and without gangrene 03/18/2017   Hiatal hernia 01/26/2017   Vitamin D deficiency 06/16/2016   Encounter for preoperative examination for general surgical procedure 03/06/2016   Hypercholesterolemia 02/25/2016   Hypertensive disorder 02/25/2016   Cough 08/02/2015   Obstructive sleep apnea 08/06/2013   Neoplasm of breast 07/20/2013   COPD with acute bronchitis ([place]) 06/18/2013   Myalgia 06/18/2013  History of laparoscopic adjustable gastric banding, APS. 09/04/2008. 04/08/2011   History of lung cancer 06/27/2010   SKIN RASH 12/13/2009   DYSPNEA 07/05/2008   DIABETES, TYPE 2 07/01/2008   Obesity, morbid ([place]) 07/01/2008   Type 2 diabetes mellitus without complications ([place]) 29/52/8413   COPD mixed type ([place]) 06/26/2008   Malignant neoplasm of lung ([place]) 07/20/2001   Past Medical History:  Diagnosis Date   Abdominal pain    around naval   Anxiety    Arthritis    Bipolar disorder ([place])    Breast pain in female    Bruises easily    Cancer ([place]) 2003   left lung    Cervical disc disease    Chronic kidney disease    COPD (chronic obstructive pulmonary disease) ([place])    O2 as needed.   Depression    DJD (degenerative joint disease)    Dyspnea    uses O2 via [place] 2-3L PRN   Gum disease    Hernia    Hypoglycemic episode in patient with diabetes mellitus ([place]) 2021   Diet controlled diabetes with occasional hypoglycemia at night   Hypothyroidism    no meds   IDA (iron deficiency anemia) 03/27/2021   in [place]   Insomnia    not currently a problem   Phlebitis    Pneumonia 03/2022   mild case   Pre-diabetes    diet controlled, no meds   Thyroid disease    Walker as ambulation aid    Wears glasses    Weight increase     Family History  Problem Relation Age of Onset   Stroke Mother    Stroke Father     Past Surgical History:  Procedure Laterality Date   ABDOMINAL HYSTERECTOMY     partial   ANTERIOR CERVICAL DECOMP/DISCECTOMY FUSION N/A 06/05/2022   Procedure: C3-4 ANTERIOR CERVICAL DISCECTOMY FUSION, ALLOGRAFT, PLATE;  Surgeon: Marybelle Killings, MD;  Location: [facility];  Service: Orthopedics;  Laterality: N/A;   CATARACT EXTRACTION  04/2022   FOOT SURGERY     right   HERNIA REPAIR     LAPAROSCOPIC GASTRIC BANDING  08/2008   LAPAROSCOPIC ROUX-EN-Y GASTRIC BYPASS WITH UPPER ENDOSCOPY AND REMOVAL OF LAP BAND  2018   LEFT HEART CATHETERIZATION WITH CORONARY ANGIOGRAM N/A 05/02/2012   Procedure: LEFT HEART CATHETERIZATION WITH CORONARY ANGIOGRAM;  Surgeon: Laverda Page, MD;  Location: [facility] C[facility] LAB;  Service: Cardiovascular;  Laterality: N/A;   LUNG LOBECTOMY  2003   left   MASS EXCISION Right 06/06/2020   Procedure: REMOVAL MASS ON RIGHT BUTTOCK;  Surgeon: Michael Boston, MD;  [facility];  Service: General;  Laterality: Right;   SPINE SURGERY     fusion on the neck   THYROIDECTOMY     partial   TONSILLECTOMY     VENTRAL HERNIA REPAIR N/A 06/06/2020   Procedure: [place] WITH MESH AND TAP BLOCK;   Surgeon: Michael Boston, MD;  [facility];  Service: General;  Laterality: N/A;   Social History   Occupational History   Not on file  Tobacco Use   Smoking status: Former    Packs/day: 2.00    Years: 37.00    Total pack years: 74.00    Types: Cigarettes    Quit date: 06/21/2002    Years since quitting: 20.0   Smokeless tobacco: Never  Vaping Use   Vaping Use: Never used  Substance and Sexual Activity   Alcohol use: Yes  Comment: [employer] Liquor/WIne   Drug use: No   Sexual activity: Not on file    Comment: Hysterectomy

## 2022-07-24 ENCOUNTER — Encounter: Payer: Medicare Other | Admitting: Orthopaedic Surgery

## 2022-07-26 NOTE — Progress Notes (Unsigned)
Subjective:    Patient ID: [name], female    DOB: Nov 18, 1945,     MRN: [id number]     77  year-old female quit smoking 2003  followed for COPD with Dr. Annamaria Boots. She has a history of a left lower lobe resection and chemotherapy in 2003 for non-small cell lung cancer. History of obesity status post lap band surgery.  GOLD II criteria 03/06/16   03/06/2016 NP Follow up : COPD , surgical clearance She presents for a follow-up for COPD. She also will be undergoing gastric bypass surgery and requires pulmonary surgical clearance.  Patient says overall she is doing well with her breathing. She denies any shortness of breath, wheezing, or increased cough. It is not on any oxygen, and O2 saturation today is 97% on room air. She says that she is active, does housework. Says can not walk long distance due to dyspnea.  Simple spirometry today shows mild COPD with an FEV1 at 78%, ratio 58, FVC 104%. Takes Spiriva daily .  Had lap bad surgery in 2008 . Lost around 60lbs. But wants to lose more and has trouble with lap band with taking her meds.  Dr. Darnell Level with [facility] surgeon on [place].  Has very  mild OSA with 2014 PSG at ~5.3/hr (wt 234lbs). Did not require CPAP . Recommended weight loss.  Has DM , diet controlled.  rec No change rx/ cleared for surgery    09/23/2016 acute extended ov/Nevaeh Korte re:  GOLD II COPD / acute cough with hemptysis   Chief Complaint  Patient presents with   Acute Visit    Pt c/o cough with clear sputum mixed with dark red since last night. She states she has been feeling more SOB for the past 2 days. She had low grade temp this morning. She also c/o aches- back and arms.   acute onset sob with bad cough variably bloody assoc   R sided cp not classically pleuritic assoc with polyuria and headache.  Also some leg swelling but nothing new/acute in last few weeks. Mild chills/fever no rigors/ no epistaxis Rec Not on file    NP recs as of 06/01/22 Rx  stiolto    07/27/2022  Acute ov/[place] office/Maurice Fotheringham re: COPD GOLD 2  maint on trelegy each am /02  hs and prn daytime   Chief Complaint  Patient presents with   Follow-up    Cough and sinus congestion.  Slight pain under left breast area.  Feels fatigued, urinating more frequently.  Cold sx x 1 week.  Dyspnea:  walking with rollator and [employer] pushing buggy / was able to get up steps at home half way up baseline  Cough: slt beige / no blood  Sleeping: R side down 30 degrees in recliner  SABA use: using avg hfa 1-2 x per day/ not neb  02: 2lpm and prn  Covid status: all but the new      No obvious day to day or daytime variability or assoc excess/ purulent sputum or mucus plugs or hemoptysis or cp or chest tightness, subjective wheeze or overt sinus or hb symptoms.   a without nocturnal  or early am exacerbation  of respiratory  c/o's or need for noct saba. Also denies any obvious fluctuation of symptoms with weather or environmental changes or other aggravating or alleviating factors except as outlined above   No unusual exposure hx or h/o childhood pna/ asthma or knowledge of premature birth.  Current Allergies, Complete Past Medical History,  Past Surgical History, Family History, and Social History were reviewed in [employer] record.  ROS  The following are not active complaints unless bolded Hoarseness, sore throat, dysphagia, dental problems, itching, sneezing,  nasal congestion or discharge of excess mucus or purulent secretions, ear ache,   fever, chills, sweats, unintended wt loss or wt gain, classically pleuritic or exertional cp,  orthopnea pnd or arm/hand swelling  or leg swelling, presyncope, palpitations, abdominal pain, anorexia, nausea, vomiting, diarrhea  or change in bowel habits or change in bladder habits, change in stools or change in urine, dysuria, hematuria,  rash, arthralgias, visual complaints, headache, numbness, weakness or ataxia or  problems with walking or coordination,  change in mood or  memory.        Current Meds  Medication Sig   acidophilus (RISAQUAD) CAPS capsule Take 1 capsule by mouth daily.   albuterol (PROVENTIL) (2.5 MG/3ML) 0.083% nebulizer solution USE 1 VIAL IN NEBULIZER EVERY 8 HOURS AS NEEDED FOR WHEEZING OR SHORTNESS OF BREATH   albuterol (VENTOLIN HFA) 108 (90 Base) MCG/ACT inhaler TAKE 2 PUFFS BY MOUTH EVERY 6 HOURS AS NEEDED FOR WHEEZE OR SHORTNESS OF BREATH   aspirin EC 81 MG tablet Take 1 tablet (81 mg total) by mouth daily. Swallow whole.   cetirizine (ZYRTEC) 10 MG tablet Take 10 mg by mouth daily.   chlorpheniramine-HYDROcodone (TUSSIONEX PENNKINETIC ER) 10-8 MG/5ML Take 5 mLs by mouth every 12 (twelve) hours as needed for cough.   Cholecalciferol (VITAMIN D3) 125 MCG (5000 UT) TABS Take 10,000 Units by mouth daily.   Cyanocobalamin (B-12) 5000 MCG CAPS Take 5,000 mcg by mouth daily.   diphenhydrAMINE (BENADRYL) 12.5 MG/5ML liquid 25 mg every 6 (six) hours as needed for allergies or itching.   Fluticasone-Umeclidin-Vilant (TRELEGY ELLIPTA) 100-62.5-25 MCG/ACT AEPB Inhale 1 puff into the lungs daily.   FREESTYLE INSULINX TEST test strip AS DIRECTED TO OBTAIN BLOOD TWICE A DAY AS NEEDED DX CODE E11.69 90 DAYS   furosemide (LASIX) 20 MG tablet Take 20 mg by mouth daily as needed for edema or fluid.   gabapentin (NEURONTIN) 100 MG capsule Take 3 capsules (300 mg total) by mouth 3 (three) times daily.   glucose 4 GM chewable tablet Chew 1-3 tablets by mouth as needed for low blood sugar.   ketoconazole (NIZORAL) 2 % cream Apply 1 Application topically daily as needed for irritation.   Lancets (FREESTYLE) lancets    Menthol-Methyl Salicylate (SALONPAS PAIN RELIEF PATCH) 3-10 % PTCH Place 1 patch onto the skin daily as needed (pain).   methocarbamol (ROBAXIN) 500 MG tablet Take 1 tablet (500 mg total) by mouth every 6 (six) hours as needed for muscle spasms.   Nebulizers (COMPRESSOR/NEBULIZER) MISC Use  as directed   olmesartan-hydrochlorothiazide (BENICAR HCT) 20-12.5 MG tablet TAKE 1 TABLET BY MOUTH EVERY DAY (Patient taking differently: Take 1 tablet by mouth every other day.)   Polyethyl Glycol-Propyl Glycol (SYSTANE OP) Place 1 drop into both eyes 3 (three) times daily as needed (dry eyes).   trolamine salicylate (ASPERCREME) 10 % cream Apply 1 Application topically as needed for muscle pain.               Objective:   Physical Exam  Wts   07/27/2022          ***   09/23/16 215 lb 9.6 oz (97.8 kg)  05/11/16 217 lb 9.6 oz (98.7 kg)  03/06/16 218 lb (98.9 kg)     Vital signs reviewed  07/27/2022  -  Note at rest 02 sats  ***% on ***   General appearance:    amb with rollator/ nasal congestion nad distant bs

## 2022-07-27 ENCOUNTER — Ambulatory Visit: Payer: Medicare Other | Admitting: Internal Medicine

## 2022-07-27 ENCOUNTER — Ambulatory Visit (INDEPENDENT_AMBULATORY_CARE_PROVIDER_SITE_OTHER): Payer: Medicare Other

## 2022-07-27 ENCOUNTER — Encounter: Payer: Self-pay | Admitting: Internal Medicine

## 2022-07-27 DIAGNOSIS — R051 Acute cough: Secondary | ICD-10-CM

## 2022-07-27 MED ORDER — PREDNISONE 10 MG PO TABS: ORAL_TABLET | ORAL | 0 refills | Status: DC

## 2022-07-27 MED ORDER — HYDROCOD POLI-CHLORPHE POLI ER 10-8 MG/5ML PO SUER: 5.0000 mL | Freq: Two times a day (BID) | ORAL | 0 refills | Status: DC | PRN

## 2022-07-27 MED ORDER — AMOXICILLIN-POT CLAVULANATE 875-125 MG PO TABS: 1.0000 | ORAL_TABLET | Freq: Two times a day (BID) | ORAL | 0 refills | Status: DC

## 2022-07-27 NOTE — Patient Instructions (Signed)
Augmentin 875 mg take one pill twice daily  X 10 days - take at breakfast and supper with large glass of water.  It would help reduce the usual side effects (diarrhea and yeast infections) if you ate cultured yogurt at lunch.   For cough / congestion > try Mucinex dm 1200 mg every 12 hours and supplement with tessionex if needed   Prednisone 10 mg take  4 each am x 2 days,   2 each am x 2 days,  1 each am x 2 days and stop   Please remember to go to the  x-ray department  for your tests - we will call you with the results when they are available     Follow up with Dr Annamaria Boots as planned

## 2022-07-28 ENCOUNTER — Encounter: Payer: Self-pay | Admitting: Internal Medicine

## 2022-07-28 NOTE — Assessment & Plan Note (Signed)
Recurrent flare of cough and doe more likely due to MO than copd  while main on trelegy which may be problematic if cough proves refractory as appears mostly upper airway in nature ? Triggered by sinusitis so rec  1) augmentinx x 10day 2) continue trelegy for and use saba approp Re SABA :  I spent extra time with pt today reviewing appropriate use of albuterol for prn use on exertion with the following points: 1) saba is for relief of sob that does not improve by walking a slower pace or resting but rather if the pt does not improve after trying this first. 2) If the pt is convinced, as many are, that saba helps recover from activity faster then it's easy to tell if this is the case by re-challenging : ie stop, take the inhaler, then p 5 minutes try the exact same activity (intensity of workload) that just caused the symptoms and see if they are substantially diminished or not after saba 3) if there is an activity that reproducibly causes the symptoms, try the saba 15 min before the activity on alternate days   If in fact the saba really does help, then fine to continue to use it prn but advised may need to look closer at the maintenance regimen being used to achieve better control of airways disease with exertion.   3) Prednisone 10 mg take  4 each am x 2 days,   2 each am x 2 days,  1 each am x 2 days and stop  4) refilled limited amt of tussionex with above caveat re trelegy > deferred to Dr Annamaria Boots  F/u in this clinic is prn          Each maintenance medication was reviewed in detail including emphasizing most importantly the difference between maintenance and prns and under what circumstances the prns are to be triggered using an action plan format where appropriate.  Total time for H and P, chart review, counseling, reviewing hfa/neb/dpi  device(s) and generating customized AVS unique to this office visit / same day charting = 30 min with pt not seen by me in > 5 years

## 2022-07-31 NOTE — Telephone Encounter (Signed)
Patient has follow up visit with Dr Maple Hudson on 08/27/2022 for surgical clearance.

## 2022-08-04 ENCOUNTER — Encounter: Payer: Medicare Other | Admitting: Orthopaedic Surgery

## 2022-08-13 ENCOUNTER — Ambulatory Visit: Payer: Medicare Other | Admitting: Internal Medicine

## 2022-08-25 ENCOUNTER — Encounter: Payer: Self-pay | Admitting: Internal Medicine
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
## 2022-08-27 ENCOUNTER — Ambulatory Visit: Payer: Medicare PPO | Admitting: Internal Medicine

## 2022-08-27 ENCOUNTER — Encounter: Payer: Self-pay | Admitting: Internal Medicine

## 2022-08-27 VITALS — BP 120/78 | HR 74 | Ht 60.0 in | Wt 224.8 lb

## 2022-08-27 DIAGNOSIS — J9611 Chronic respiratory failure with hypoxia: Secondary | ICD-10-CM

## 2022-08-27 DIAGNOSIS — J449 Chronic obstructive pulmonary disease, unspecified: Secondary | ICD-10-CM

## 2022-08-27 MED ORDER — BREZTRI AEROSPHERE 160-9-4.8 MCG/ACT IN AERO: INHALATION_SPRAY | RESPIRATORY_TRACT | 12 refills | Status: DC

## 2022-08-27 NOTE — Assessment & Plan Note (Signed)
At baseline there may not be a lot of airway irritability. Plan-switch from Trelegy to [place] as she requests.  She will pay attention to clinical effects.

## 2022-08-27 NOTE — Patient Instructions (Signed)
Script sent to replace Trelegy with Judithann Sauger   Inhale 2 puffs then rinse mouth, twice daily  Please call if we can

## 2022-08-27 NOTE — Assessment & Plan Note (Signed)
Continues to benefit from oxygen 2 L during sleep and with exertion

## 2022-08-28 NOTE — Telephone Encounter (Signed)
She is clear for surgery from pulmonary standpoint. I have put that addendum into my note from 08/27/22.

## 2022-08-28 NOTE — Telephone Encounter (Signed)
Sir,  You saw this patient yesterday in office could you let me know if she is clear from a pulmonary standpoint for her Cervical Fusion surgery. The surgery is pending at the moment.   Please advise sir

## 2022-08-31 ENCOUNTER — Ambulatory Visit: Payer: Medicare Other | Admitting: Podiatry

## 2022-08-31 NOTE — Telephone Encounter (Signed)
OV notes and clearance form have been faxed back to [place] at [facility]. Nothing further needed at this time.

## 2022-09-14 ENCOUNTER — Ambulatory Visit (INDEPENDENT_AMBULATORY_CARE_PROVIDER_SITE_OTHER): Payer: Medicare PPO | Admitting: Podiatry

## 2022-09-14 ENCOUNTER — Encounter: Payer: Self-pay | Admitting: Podiatry

## 2022-09-14 DIAGNOSIS — E1151 Type 2 diabetes mellitus with diabetic peripheral angiopathy without gangrene: Secondary | ICD-10-CM | POA: Diagnosis not present

## 2022-09-14 DIAGNOSIS — M79676 Pain in unspecified toe(s): Secondary | ICD-10-CM

## 2022-09-14 DIAGNOSIS — B351 Tinea unguium: Secondary | ICD-10-CM | POA: Diagnosis not present

## 2022-09-14 NOTE — Progress Notes (Signed)
  Subjective:  Patient ID: [name], female    DOB: 05-Aug-1945,   MRN: [id number]  Chief Complaint  Patient presents with   Nail Problem    Routine foot care     77 y.o. female presents for diabetic routine foot care. Relates thick elongated and painful toenails that she cannot trim herself. Relates she is diabetic and last A1c was 6 on 03/18/21 .  Denies any other pedal complaints. Denies n/v/f/c.   PCP Seward Carol MD   Past Medical History:  Diagnosis Date   Abdominal pain    around naval   Anxiety    Arthritis    Bipolar disorder [facility])    Breast pain in female    Bruises easily    Cancer ([place]) 2003   left lung   Cervical disc disease    Chronic kidney disease    COPD (chronic obstructive pulmonary disease) ([place])    O2 as needed.   Depression    DJD (degenerative joint disease)    Dyspnea    uses O2 via [place] 2-3L PRN   Gum disease    Hernia    Hypoglycemic episode in patient with diabetes mellitus ([place]) 2021   Diet controlled diabetes with occasional hypoglycemia at night   Hypothyroidism    no meds   IDA (iron deficiency anemia) 03/27/2021   in [place]   Insomnia    not currently a problem   Obstructive sleep apnea 08/06/2013   Phlebitis    Pneumonia 03/2022   mild case   Pre-diabetes    diet controlled, no meds   Thyroid disease    Walker as ambulation aid    Wears glasses    Weight increase     Objective:  Physical Exam: Vascular: DP/PT pulses 2/4 bilateral. CFT <3 seconds. Normal hair growth on digits. No edema.  Skin. No lacerations or abrasions bilateral feet. Nails 1-5 are thickened discolored and elongated with subungual debris.  Musculoskeletal: MMT 5/5 bilateral lower extremities in DF, PF, Inversion and Eversion. Deceased ROM in DF of ankle joint.  Neurological: Sensation intact to light touch.   Assessment:   1. Pain due to onychomycosis of toenail   2. Type 2 diabetes mellitus with peripheral vascular disease ([place])       Plan:   Patient was evaluated and treated and all questions answered. -Discussed and educated patient on diabetic foot care, especially with  regards to the vascular, neurological and musculoskeletal systems.  -Stressed the importance of good glycemic control and the detriment of not  controlling glucose levels in relation to the foot. -Discussed supportive shoes at all times and checking feet regularly.  -Mechanically debrided all nails 1-5 bilateral using sterile nail nipper and filed with dremel without incident  -Answered all patient questions -Patient to return  in 3 months for at risk foot care -Patient advised to call the office if any problems or questions arise in the meantime.   Lorenda Peck, DPM

## 2022-09-21 ENCOUNTER — Telehealth: Payer: Self-pay | Admitting: Internal Medicine

## 2022-09-21 ENCOUNTER — Other Ambulatory Visit: Payer: Self-pay

## 2022-09-21 MED ORDER — STIOLTO RESPIMAT 2.5-2.5 MCG/ACT IN AERS: 2.0000 | INHALATION_SPRAY | Freq: Every day | RESPIRATORY_TRACT | 12 refills | Status: DC

## 2022-09-21 NOTE — Telephone Encounter (Signed)
Spoke with patient. Sent in prescription for Stiolto. Nothing further needed

## 2022-09-21 NOTE — Telephone Encounter (Signed)
Pt. Calling because the inhaler Budeson-Glycopyrrol-Formoterol (BREZTRI AEROSPHERE) that she is on is causing her cramping and wants a different inhaler called in to her pharmacy

## 2022-09-21 NOTE — Telephone Encounter (Signed)
DC Breztri. Try Stiolto 2.5, # 1   inhale 2 puffs daily    refill x 12

## 2022-09-21 NOTE — Telephone Encounter (Signed)
Patient advises she cramping in legs and feet since using the breztri. She stopped using breztri last week and cramping has subsided, but breathing is a little worse. Dr. Annamaria Boots can you please advise

## 2022-09-22 ENCOUNTER — Encounter: Payer: Self-pay | Admitting: Orthopaedic Surgery

## 2022-09-22 ENCOUNTER — Ambulatory Visit: Payer: Medicare PPO | Admitting: Orthopaedic Surgery

## 2022-09-22 ENCOUNTER — Ambulatory Visit (INDEPENDENT_AMBULATORY_CARE_PROVIDER_SITE_OTHER): Payer: Medicare PPO

## 2022-09-22 VITALS — BP 106/68 | HR 71 | Ht 60.0 in | Wt 224.0 lb

## 2022-09-22 DIAGNOSIS — M25511 Pain in right shoulder: Secondary | ICD-10-CM

## 2022-09-22 DIAGNOSIS — M7541 Impingement syndrome of right shoulder: Secondary | ICD-10-CM | POA: Diagnosis not present

## 2022-09-22 DIAGNOSIS — G8929 Other chronic pain: Secondary | ICD-10-CM

## 2022-09-22 MED ORDER — METHYLPREDNISOLONE ACETATE 40 MG/ML IJ SUSP
40.0000 mg | INTRAMUSCULAR | Status: AC | PRN
  Administered 2022-09-22: 40 mg via INTRA_ARTICULAR

## 2022-09-22 MED ORDER — LIDOCAINE HCL 1 % IJ SOLN
0.5000 mL | INTRAMUSCULAR | Status: AC | PRN
  Administered 2022-09-22: .5 mL

## 2022-09-22 MED ORDER — BUPIVACAINE HCL 0.25 % IJ SOLN
4.0000 mL | INTRAMUSCULAR | Status: AC | PRN
  Administered 2022-09-22: 4 mL via INTRA_ARTICULAR

## 2022-09-22 NOTE — Progress Notes (Signed)
Office Visit Note   Patient: [name]           Date of Birth: 24-May-1946           MRN: [id number] Visit Date: 09/22/2022              Requested by: Antony Blackbird, MD [address], [place],  [place] [address] PCP: Antony Blackbird, MD   Assessment & Plan: Visit Diagnoses:  1. Chronic right shoulder pain     Plan: Subacromial injection performed with immediate relief of pain she can follow-up if needed.  She does have some stenosis in the lumbar spine but with COPD home oxygen use we discussed with decompression and fusion operations would require multihour surgery.  She is using her walker at present she will continue to use this as needed or cane.  Return APRN.  Follow-Up Instructions: No follow-ups on file.   Orders:  Orders Placed This Encounter  Procedures   XR Shoulder Right   No orders of the defined types were placed in this encounter.     Procedures: Large Joint Inj: R subacromial bursa on 09/22/2022 11:44 AM Indications: pain Details: 22 G 1.5 in needle  Arthrogram: No  Medications: 4 mL bupivacaine 0.25 %; 40 mg methylPREDNISolone acetate 40 MG/ML; 0.5 mL lidocaine 1 % Outcome: tolerated well, no immediate complications Procedure, treatment alternatives, risks and benefits explained, specific risks discussed. Consent was given by the patient. Immediately prior to procedure a time out was called to verify the correct patient, procedure, equipment, support staff and site/side marked as required. Patient was prepped and draped in the usual sterile fashion.       Clinical Data: No additional findings.   Subjective: Chief Complaint  Patient presents with   Neck - Follow-up    06/05/2022 C3-4 ACDF   Right Shoulder - Follow-up    HPI patient turns she has degenerative anterolisthesis lumbar and is on oxygen at night uses it sometimes during the day.  She has used a rolling walker intermittently for last 5 years.  Increased problems  with right shoulder which in the past years has gotten good relief with the subacromial injection.  She has noticed pain with outstretched reaching.  Pain in her neck with extension.  Previous C3-4 fusion which was solid on January x-rays.  No radiating symptoms to her fingers currently.  Review of Systems long-term smoker with COPD and she uses oxygen intermittently at home.   Objective: Vital Signs: BP 106/68   Pulse 71   Ht 5' (1.524 m)   Wt 224 lb (101.6 kg)   BMI 43.75 kg/m   Physical Exam Constitutional:      Appearance: She is well-developed.  HENT:     Head: Normocephalic.     Right Ear: External ear normal.     Left Ear: External ear normal. There is no impacted cerumen.  Eyes:     Pupils: Pupils are equal, round, and reactive to light.  Neck:     Thyroid: No thyromegaly.     Trachea: No tracheal deviation.  Cardiovascular:     Rate and Rhythm: Normal rate.  Pulmonary:     Effort: Pulmonary effort is normal.  Abdominal:     Palpations: Abdomen is soft.  Musculoskeletal:     Cervical back: No rigidity.  Skin:    General: Skin is warm and dry.  Neurological:     Mental Status: She is alert and oriented to person, place, and time.  Psychiatric:        Behavior: Behavior normal.     Ortho Exam positive impingement right shoulder negative drop arm test on the biceps is normal no right brachial plexus tenderness.  Upper extremity reflexes are symmetrical.  Specialty Comments:  No specialty comments available.  Imaging: No results found.   PMFS History: Patient Active Problem List   Diagnosis Date Noted   S/P cervical spinal fusion 06/21/2022   Cervical stenosis of spine 06/05/2022   Sinusitis, acute maxillary 03/31/2022   Left lower lobe pneumonia 03/31/2022   Change in bowel habit 11/24/2021   Epigastric pain 11/24/2021   Internal hemorrhoids 11/24/2021   Personal history of colonic polyps 11/24/2021   Urticaria 11/24/2021   Spinal stenosis of  cervical region 11/05/2021   Pincer nail deformity 09/05/2021   History of diabetes mellitus 03/20/2021   Iron deficiency 03/20/2021   Pruritus 01/09/2021   Pain in left leg 12/23/2020   Prediabetes 11/18/2020   Incarcerated incisional hernia s/p lap repair w mesh 06/06/2020 06/06/2020   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   Polyp of colon 10/23/2019   Chronic respiratory failure with hypoxia ([place]) 04/01/2019   Medication management 02/28/2019   Combined forms of age-related cataract of both eyes 12/01/2018   Early stage nonexudative age-related macular degeneration of both eyes 12/01/2018   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   PVD (posterior vitreous detachment), left 11/28/2018   Dysphagia 12/30/2017   Asthma 04/12/2017   BMI 40.0-44.9, adult ([place]) 04/12/2017   Irritable bowel syndrome 04/12/2017   Osteopenia [id number]   Umbilical hernia without obstruction and without gangrene 03/18/2017   Hiatal hernia 01/26/2017   Vitamin D deficiency 06/16/2016   Encounter for preoperative examination for general surgical procedure 03/06/2016   Hypercholesterolemia 02/25/2016   Hypertensive disorder 02/25/2016   Cough 08/02/2015   Obstructive sleep apnea 08/06/2013   Neoplasm of breast 07/20/2013   COPD with acute bronchitis ([place]) 06/18/2013   Myalgia 06/18/2013   History of laparoscopic adjustable gastric banding, APS. 09/04/2008. 04/08/2011   History of lung cancer 06/27/2010   SKIN RASH 12/13/2009   DYSPNEA 07/05/2008   DIABETES, TYPE 2 07/01/2008   Obesity, morbid ([place]) 07/01/2008   Type 2 diabetes mellitus without complications ([place]) 99991111   COPD mixed type ([place]) 06/26/2008   Malignant neoplasm of lung ([place]) 07/20/2001   Past Medical History:  Diagnosis Date   Abdominal pain    around naval   Anxiety    Arthritis    Bipolar disorder ([place])    Breast pain in female    Bruises easily    Cancer ([place]) 2003   left lung   Cervical disc disease    Chronic  kidney disease    COPD (chronic obstructive pulmonary disease) ([place])    O2 as needed.   Depression    DJD (degenerative joint disease)    Dyspnea    uses O2 via [place] 2-3L PRN   Gum disease    Hernia    Hypoglycemic episode in patient with diabetes mellitus ([place]) 2021   Diet controlled diabetes with occasional hypoglycemia at night   Hypothyroidism    no meds   IDA (iron deficiency anemia) 03/27/2021   in [facility]   Insomnia    not currently a problem   Obstructive sleep apnea 08/06/2013   Phlebitis    Pneumonia 03/2022   mild case   Pre-diabetes    diet controlled, no meds   Thyroid disease    Walker  as ambulation aid    Wears glasses    Weight increase     Family History  Problem Relation Age of Onset   Stroke Mother    Stroke Father     Past Surgical History:  Procedure Laterality Date   ABDOMINAL HYSTERECTOMY     partial   ANTERIOR CERVICAL DECOMP/DISCECTOMY FUSION N/A 06/05/2022   Procedure: C3-4 ANTERIOR CERVICAL DISCECTOMY FUSION, ALLOGRAFT, PLATE;  Surgeon: Marybelle Killings, MD;  Location: [facility];  Service: Orthopedics;  Laterality: N/A;   CATARACT EXTRACTION  04/2022   FOOT SURGERY     right   HERNIA REPAIR     LAPAROSCOPIC GASTRIC BANDING  08/2008   LAPAROSCOPIC ROUX-EN-Y GASTRIC BYPASS WITH UPPER ENDOSCOPY AND REMOVAL OF LAP BAND  2018   LEFT HEART CATHETERIZATION WITH CORONARY ANGIOGRAM N/A 05/02/2012   Procedure: LEFT HEART CATHETERIZATION WITH CORONARY ANGIOGRAM;  Surgeon: Laverda Page, MD;  [facility] C[facility];  Service: Cardiovascular;  Laterality: N/A;   LUNG LOBECTOMY  2003   left   MASS EXCISION Right 06/06/2020   Procedure: REMOVAL MASS ON RIGHT BUTTOCK;  Surgeon: Michael Boston, MD;  Location: [facility];  Service: General;  Laterality: Right;   SPINE SURGERY     fusion on the neck   THYROIDECTOMY     partial   TONSILLECTOMY     VENTRAL HERNIA REPAIR N/A 06/06/2020   Procedure: [place] WITH MESH AND TAP  BLOCK;  Surgeon: Michael Boston, MD;  Location: [facility];  Service: General;  Laterality: N/A;   Social History   Occupational History   Not on file  Tobacco Use   Smoking status: Former    Packs/day: 2.00    Years: 37.00    Total pack years: 74.00    Types: Cigarettes    Quit date: 06/21/2002    Years since quitting: 20.2   Smokeless tobacco: Never  Vaping Use   Vaping Use: Never used  Substance and Sexual Activity   Alcohol use: Yes    Comment: Occisonal Liquor/WIne   Drug use: No   Sexual activity: Not on file    Comment: Hysterectomy

## 2022-10-20 ENCOUNTER — Telehealth: Payer: Self-pay

## 2022-10-20 NOTE — Telephone Encounter (Signed)
ERROR

## 2022-10-22 ENCOUNTER — Encounter: Payer: Self-pay | Admitting: Cardiology

## 2022-10-22 ENCOUNTER — Ambulatory Visit: Payer: Medicare PPO | Admitting: Cardiology

## 2022-10-22 VITALS — BP 149/92 | HR 59 | Resp 18 | Ht 60.0 in | Wt 236.0 lb

## 2022-10-22 DIAGNOSIS — E782 Mixed hyperlipidemia: Secondary | ICD-10-CM | POA: Diagnosis not present

## 2022-10-22 DIAGNOSIS — Z85118 Personal history of other malignant neoplasm of bronchus and lung: Secondary | ICD-10-CM | POA: Diagnosis not present

## 2022-10-22 DIAGNOSIS — I10 Essential (primary) hypertension: Secondary | ICD-10-CM

## 2022-10-22 DIAGNOSIS — R072 Precordial pain: Secondary | ICD-10-CM

## 2022-10-22 NOTE — Progress Notes (Signed)
ID:  [name], DOB January 02, 1946, MRN [id number]  PCP:  Johna Roles, PA  Cardiologist:  Rex Kras, DO, [facility] (established care 11/11/2021) Former Cardiology Providers: Dr. Adrian Prows  Date: 10/22/22 Last Office Visit: 05/12/2022  Chief Complaint  Patient presents with   Chest Pain    HPI  [name] is a 77 y.o. 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  Patient presents today for sooner appointment to discuss chest pain.   Over the last couple months patient is been experiencing precordial discomfort, located under the left breast, localizable, sharp-like feeling, intermittent, at its worst is 8 out of 10, lasting for few seconds, not brought on by effort related activities to her knowledge, and self-limited.  Due to the symptoms and her appointment being in May 2024 she decided come in sooner for reevaluation.  At the last office visit there was concerns for possible DVT and PE.  She had a duplex of lower extremity which was negative for DVT and a CT of the chest was negative for pulmonary embolism.  In November 2024 she also successfully underwent arthroplasty with Dr. Inda Merlin without any postoperative complications to the best of her knowledge.  FUNCTIONAL STATUS: No structured exercise program or daily routine.  ALLERGIES: No Known Allergies  MEDICATION LIST PRIOR TO VISIT: Current Meds  Medication Sig   albuterol (PROVENTIL) (2.5 MG/3ML) 0.083% nebulizer solution USE 1 VIAL IN NEBULIZER EVERY 8 HOURS AS NEEDED FOR WHEEZING OR SHORTNESS OF BREATH   albuterol (VENTOLIN HFA) 108 (90 Base) MCG/ACT inhaler TAKE 2 PUFFS BY MOUTH EVERY 6 HOURS AS NEEDED FOR WHEEZE OR SHORTNESS OF BREATH   aspirin EC 81 MG  tablet Take 1 tablet (81 mg total) by mouth daily. Swallow whole.   cetirizine (ZYRTEC) 10 MG tablet Take 10 mg by mouth daily.   chlorpheniramine-HYDROcodone (TUSSIONEX) 10-8 MG/5ML Take 5 mLs by mouth every 12 (twelve) hours as needed for cough.   Cholecalciferol (VITAMIN D3) 125 MCG (5000 UT) TABS Take 10,000 Units by mouth daily.   Cyanocobalamin (B-12) 5000 MCG CAPS Take 5,000 mcg by mouth daily.   diphenhydrAMINE (BENADRYL) 12.5 MG/5ML liquid 25 mg every 6 (six) hours as needed for allergies or itching.   FREESTYLE INSULINX TEST test strip AS DIRECTED TO OBTAIN BLOOD TWICE A DAY AS NEEDED DX CODE E11.69 90 DAYS   furosemide (LASIX) 20 MG tablet Take 20 mg by mouth daily as needed for edema or fluid.   gabapentin (NEURONTIN) 100 MG capsule Take 3 capsules (300 mg total) by mouth 3 (three) times daily.   glucose 4 GM chewable tablet Chew 1-3 tablets by mouth as needed for low blood sugar.   ketoconazole (NIZORAL) 2 % cream Apply 1 Application topically daily as needed for irritation.   Lancets (FREESTYLE) lancets    Menthol-Methyl Salicylate (SALONPAS PAIN RELIEF PATCH) 3-10 % PTCH Place 1 patch onto the skin daily as needed (pain).   Nebulizers (COMPRESSOR/NEBULIZER) MISC Use as directed   nitroGLYCERIN (NITROSTAT) 0.4 MG SL tablet Place 1 tablet (0.4 mg total) under the tongue every 5 (five) minutes as needed for chest pain. If you require more than two tablets five minutes apart go to the nearest ER via EMS.   olmesartan-hydrochlorothiazide (BENICAR HCT) 20-12.5 MG tablet TAKE 1 TABLET BY MOUTH EVERY DAY (Patient taking differently:  Take 1 tablet by mouth every other day.)   Polyethyl Glycol-Propyl Glycol (SYSTANE OP) Place 1 drop into both eyes 3 (three) times daily as needed (dry eyes).   rosuvastatin (CRESTOR) 10 MG tablet Take 1 tablet (10 mg total) by mouth at bedtime.   Tiotropium Bromide-Olodaterol (STIOLTO RESPIMAT) 2.5-2.5 MCG/ACT AERS Inhale 2 puffs into the lungs daily.    trolamine salicylate (ASPERCREME) 10 % cream Apply 1 Application topically as needed for muscle pain.     PAST MEDICAL HISTORY: Past Medical History:  Diagnosis Date   Abdominal pain    around naval   Anxiety    Arthritis    Bipolar disorder    Breast pain in female    Bruises easily    Cancer 2003   left lung   Cervical disc disease    Chronic kidney disease    COPD (chronic obstructive pulmonary disease)    O2 as needed.   Depression    DJD (degenerative joint disease)    Dyspnea    uses O2 via [place] 2-3L PRN   Gum disease    Hernia    Hypoglycemic episode in patient with diabetes mellitus 2021   Diet controlled diabetes with occasional hypoglycemia at night   Hypothyroidism    no meds   IDA (iron deficiency anemia) 03/27/2021   in [place]   Insomnia    not currently a problem   Obstructive sleep apnea 08/06/2013   Phlebitis    Pneumonia 03/2022   mild case   Pre-diabetes    diet controlled, no meds   Thyroid disease    Walker as ambulation aid    Wears glasses    Weight increase     PAST SURGICAL HISTORY: Past Surgical History:  Procedure Laterality Date   ABDOMINAL HYSTERECTOMY     partial   ANTERIOR CERVICAL DECOMP/DISCECTOMY FUSION N/A 06/05/2022   Procedure: C3-4 ANTERIOR CERVICAL DISCECTOMY FUSION, ALLOGRAFT, PLATE;  Surgeon: Marybelle Killings, MD;  Location: [place] ;  Service: Orthopedics;  Laterality: N/A;   CATARACT EXTRACTION  04/2022   FOOT SURGERY     right   HERNIA REPAIR     LAPAROSCOPIC GASTRIC BANDING  08/2008   LAPAROSCOPIC ROUX-EN-Y GASTRIC BYPASS WITH UPPER ENDOSCOPY AND REMOVAL OF LAP BAND  2018   LEFT HEART CATHETERIZATION WITH CORONARY ANGIOGRAM N/A 05/02/2012   Procedure: LEFT HEART CATHETERIZATION WITH CORONARY ANGIOGRAM;  Surgeon: Laverda Page, MD;  Location: [facility];  Service: Cardiovascular;  Laterality: N/A;   LUNG LOBECTOMY  2003   left   MASS EXCISION Right 06/06/2020   Procedure: REMOVAL MASS ON RIGHT BUTTOCK;  Surgeon: Michael Boston, MD;  Location: [facility];  Service: General;  Laterality: Right;   SPINE SURGERY     fusion on the neck   THYROIDECTOMY     partial   TONSILLECTOMY     VENTRAL HERNIA REPAIR N/A 06/06/2020   Procedure: LAPAROSCOPIC INCARCERATED INCISIONAL HERNIA REPAIR WITH MESH AND TAP BLOCK;  Surgeon: Michael Boston, MD;  Location: [facility];  Service: General;  Laterality: N/A;    FAMILY HISTORY: The patient family history includes Stroke in her father and mother.  SOCIAL HISTORY:  The patient  reports that she quit smoking about 20 years ago. Her smoking use included cigarettes. She has a 74.00 pack-year smoking history. She has never used smokeless tobacco. She reports current alcohol use. She reports that she does not use drugs.  REVIEW OF SYSTEMS: Review of Systems  Cardiovascular:  Positive  for chest pain (See HPI) and dyspnea on exertion (chronic and stable currently on nasal cannula oxygen). Negative for leg swelling, palpitations and syncope.  Respiratory:  Positive for shortness of breath (Chronic and stable).   Musculoskeletal:  Positive for arthritis, back pain and joint pain.    PHYSICAL EXAM:    10/22/2022   11:52 AM 09/22/2022   10:22 AM 08/27/2022    2:23 PM  Vitals with BMI  Height 5\' 0"  5\' 0"  5\' 0"   Weight 236 lbs 224 lbs 224 lbs 13 oz  BMI 46.09 123456 [id number]  Systolic 123456 [id number]  Diastolic 92 68 78  Pulse 59 71 74   CONSTITUTIONAL: Well-developed and well-nourished. No acute distress.  Walks with a walker and supplemental oxygen 2L SKIN: Skin is warm and dry. No rash noted. No cyanosis. No pallor. No jaundice HEAD: Normocephalic and atraumatic.  EYES: No scleral icterus MOUTH/THROAT: Moist oral membranes.  NECK: No JVD present. No thyromegaly noted. No carotid bruits  CHEST Normal respiratory effort. No intercostal retractions  LUNGS: Decreased breath sounds bilaterally.  No stridor. No wheezes. No rales.  CARDIOVASCULAR: Regular rate and rhythm, positive S1-S2, no murmurs  rubs or gallops appreciated. ABDOMINAL: Obese, soft, nontender, nondistended, positive bowel sounds all 4 quadrants no apparent ascites.  EXTREMITIES: + 1 Bilateral peripheral edema, warm to touch, 1+ bilateral DP and PT pulses HEMATOLOGIC: No significant bruising NEUROLOGIC: Oriented to person, place, and time. Nonfocal. Normal muscle tone.  PSYCHIATRIC: Normal mood and affect. Normal behavior. Cooperative  CARDIAC DATABASE: EKG: October 22, 2022: Sinus rhythm, 61 bpm, left axis, left anterior fascicular block, without underlying injury pattern.  EM: No acute  Echocardiogram: 10/29/2021 [facility]: Per report LVEF 55 to 60%, mild LVH, aortic atherosclerosis, normal left atrial size,  Stress Testing: Lexiscan Tetrofosmin stress test 12/08/2021: Lexiscan nuclear stress test performed using 1-day protocol. Normal myocardial perfusion. Stress LVEF 55%. Low risk study.  Heart Catheterization: 05/02/2012: Right coronary artery: The vessel is smooth, normal, Very small with a very tiny PD branch. It is Co- Dominant. Normal   Left main coronary artery is large and normal.   Circumflex coronary artery: A large vessel giving origin to a large obtuse marginal 1. It is dominant. Normal   LAD:  LAD gives origin to a large diagonal-1. LAD Ends before reaching the apex. Normal   Ramus intermediate: It is a very large branch. It is smooth and normal.  LV gram: 55-60%  Lower extremity arterial duplex: 02/25/2018. No hemodynamically significant stenosis within the bilateral lower extremity.  Right ABI 0.93, mildly decreased perfusion.  Left ABI 1.04, normal perfusion.  Coronary calcium score:  12/10/2021  No visible coronary artery calcifications. Total coronary calcium score of 0. No acute extra cardiac abnormality.  LABORATORY DATA: Hemoglobin A1c   2020-03-20    eAG 143      Hgb A1c 6.6   4.8-5.6  BNP   2020-03-20    BNP 50   [id number]   Comp Metabolic Panel   [id number]     Albumin 4.1   3.4-4.8  ALP 76   38-126  ALT 12   0-52  Anion Gap 10.4   6.0-20.0  AST 19   0-39  BUN 25   6-26  CO2 31   22-32  CA-corrected 9.61   8.60-10.30  Calcium 9.7   8.6-10.3  Chloride 104   98-107  Creatinine 1.05   0.60-1.30  AAeGFR 62   >60  eGFR 51   >  60  Glucose 100   70-99  Potassium 4.6   3.5-5.5  Sodium 141   136-145   External Labs: Collected: 11/14/2020. Total cholesterol 204, triglycerides 93, HDL 59, LDL 128. Hemoglobin A1c 6.4%  Collected: October 29, 2021 provided by the patient. Hemoglobin 15.1 g/dL, hematocrit 48.1%. BUN 20, creatinine 1.15. eGFR 49. Sodium 145, potassium 4.8, chloride 105, bicarb 25, AST 27, ALT 16. Total cholesterol 224, triglycerides 114, HDL 69, LDL 140. A1c 6.3  Lab Results  Component Value Date   CHOL 151 12/12/2021   HDL 59 12/12/2021   LDLCALC 75 12/12/2021   LDLDIRECT 75 12/12/2021   TRIG 92 12/12/2021   CHOLHDL 2.6 05/01/2012    IMPRESSION:    ICD-10-CM   1. Precordial pain  R07.2 EKG 12-Lead    PCV ECHOCARDIOGRAM COMPLETE    2. Benign hypertension  I10     3. Mixed hyperlipidemia  E78.2 Lipid Panel With LDL/HDL Ratio    LDL cholesterol, direct    CMP14+EGFR    4. Hx of cancer of lung  Z85.118        RECOMMENDATIONS: Chaquana A Damico is a 76 y.o. 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  Precordial pain Comes in for sick visit due to precordial discomfort. Her symptoms of precordial discomfort are noncardiac based on symptoms. On physical examinations no signs for herpes zoster. Prior ischemic workup has illustrated preserved LVEF, total coronary calcium score of 0, and nuclear stress test low risk. EKG today is nonischemic. Shared decision was to proceed  with echocardiogram to reevaluate LVEF and regional wall motion abnormalities.  If there is a significant change when compared to prior study additional ischemic workup should be warranted. I have asked the patient to reach out to PCP and pulmonary medicine as they are noncardiac causes of chest pain as well which may be contributory  Benign hypertension Office blood pressures are not well-controlled. Home blood pressures are better controlled-according to the patient. Medications reconciled. Currently managed by primary care provider.  Mixed hyperlipidemia Initially her LDL levels used to be 140 mg/dL with medical therapy lipids have improved to 75 mg/dL as of May 2023. Patient is requesting that her lipids be rechecked. Will check fasting lipid profile, direct LDL and CMP  Hx of cancer of lung Currently on nasal cannula oxygen History of lobectomy, chemotherapy. Has underlying COPD/emphysema requiring nasal cannula oxygen supplementation. I suspect that this degree of her discomfort may be secondary to advanced pulmonary disease -have asked her to follow-up with PCP and pulmonary medicine for further guidance.  Patient states that she wanted to rule out cardiac etiology first.  FINAL MEDICATION LIST END OF ENCOUNTER: No orders of the defined types were placed in this encounter.   Medications Discontinued During This Encounter  Medication Reason   methocarbamol (ROBAXIN) 500 MG tablet    predniSONE (DELTASONE) 10 MG tablet      Current Outpatient Medications:    albuterol (PROVENTIL) (2.5 MG/3ML) 0.083% nebulizer solution, USE 1 VIAL IN NEBULIZER EVERY 8 HOURS AS NEEDED FOR WHEEZING OR SHORTNESS OF BREATH, Disp: 375 mL, Rfl: 11   albuterol (VENTOLIN HFA) 108 (90 Base) MCG/ACT inhaler, TAKE 2 PUFFS BY MOUTH EVERY 6 HOURS AS NEEDED FOR WHEEZE OR SHORTNESS OF BREATH, Disp: 18 each, Rfl: 12   aspirin EC 81 MG tablet, Take  1 tablet (81 mg total) by mouth daily. Swallow whole., Disp: 30  tablet, Rfl: 11   cetirizine (ZYRTEC) 10 MG tablet, Take 10 mg by mouth daily., Disp: , Rfl:    chlorpheniramine-HYDROcodone (TUSSIONEX) 10-8 MG/5ML, Take 5 mLs by mouth every 12 (twelve) hours as needed for cough., Disp: 115 mL, Rfl: 0   Cholecalciferol (VITAMIN D3) 125 MCG (5000 UT) TABS, Take 10,000 Units by mouth daily., Disp: , Rfl:    Cyanocobalamin (B-12) 5000 MCG CAPS, Take 5,000 mcg by mouth daily., Disp: , Rfl:    diphenhydrAMINE (BENADRYL) 12.5 MG/5ML liquid, 25 mg every 6 (six) hours as needed for allergies or itching., Disp: , Rfl:    FREESTYLE INSULINX TEST test strip, AS DIRECTED TO OBTAIN BLOOD TWICE A DAY AS NEEDED DX CODE E11.69 90 DAYS, Disp: , Rfl:    furosemide (LASIX) 20 MG tablet, Take 20 mg by mouth daily as needed for edema or fluid., Disp: , Rfl:    gabapentin (NEURONTIN) 100 MG capsule, Take 3 capsules (300 mg total) by mouth 3 (three) times daily., Disp: 90 capsule, Rfl: 1   glucose 4 GM chewable tablet, Chew 1-3 tablets by mouth as needed for low blood sugar., Disp: , Rfl:    ketoconazole (NIZORAL) 2 % cream, Apply 1 Application topically daily as needed for irritation., Disp: 60 g, Rfl: 2   Lancets (FREESTYLE) lancets, , Disp: , Rfl:    Menthol-Methyl Salicylate (SALONPAS PAIN RELIEF PATCH) 3-10 % PTCH, Place 1 patch onto the skin daily as needed (pain)., Disp: , Rfl:    Nebulizers (COMPRESSOR/NEBULIZER) MISC, Use as directed, Disp: 1 each, Rfl: prn   nitroGLYCERIN (NITROSTAT) 0.4 MG SL tablet, Place 1 tablet (0.4 mg total) under the tongue every 5 (five) minutes as needed for chest pain. If you require more than two tablets five minutes apart go to the nearest ER via EMS., Disp: 30 tablet, Rfl: 0   olmesartan-hydrochlorothiazide (BENICAR HCT) 20-12.5 MG tablet, TAKE 1 TABLET BY MOUTH EVERY DAY (Patient taking differently: Take 1 tablet by mouth every other day.), Disp: 30 tablet, Rfl: 0   Polyethyl Glycol-Propyl Glycol (SYSTANE OP), Place 1 drop into both eyes 3 (three)  times daily as needed (dry eyes)., Disp: , Rfl:    rosuvastatin (CRESTOR) 10 MG tablet, Take 1 tablet (10 mg total) by mouth at bedtime., Disp: 90 tablet, Rfl: 0   Tiotropium Bromide-Olodaterol (STIOLTO RESPIMAT) 2.5-2.5 MCG/ACT AERS, Inhale 2 puffs into the lungs daily., Disp: 1 each, Rfl: 12   trolamine salicylate (ASPERCREME) 10 % cream, Apply 1 Application topically as needed for muscle pain., Disp: , Rfl:    acidophilus (RISAQUAD) CAPS capsule, Take 1 capsule by mouth daily. (Patient not taking: Reported on 10/22/2022), Disp: , Rfl:    Budeson-Glycopyrrol-Formoterol (BREZTRI AEROSPHERE) 160-9-4.8 MCG/ACT AERO, Inhale 2 puffs then rinse mouth, twice daily (Patient not taking: Reported on 10/22/2022), Disp: 10.7 g, Rfl: 12  Orders Placed This Encounter  Procedures   Lipid Panel With LDL/HDL Ratio   LDL cholesterol, direct   CMP14+EGFR   EKG 12-Lead   PCV ECHOCARDIOGRAM COMPLETE    There are no Patient Instructions on file for this visit.   --Continue cardiac medications as reconciled in final medication list. --Return in about 4 weeks (around 11/19/2022) for Reevaluation of, Chest pain. Or sooner if needed. --Continue follow-up with your primary care physician regarding the management of your other chronic comorbid conditions.  Patient's questions and concerns were addressed to her satisfaction. She voices understanding of  the instructions provided during this encounter.   This note was created using a voice recognition software as a result there may be grammatical errors inadvertently enclosed that do not reflect the nature of this encounter. Every attempt is made to correct such errors.  Rex Kras, [place], [facility]  Pager:  [phone] Office: [phone]

## 2022-10-29 ENCOUNTER — Ambulatory Visit: Payer: Medicare PPO | Admitting: Primary Care

## 2022-10-30 ENCOUNTER — Ambulatory Visit (INDEPENDENT_AMBULATORY_CARE_PROVIDER_SITE_OTHER): Payer: Medicare PPO | Admitting: Nurse Practitioner

## 2022-10-30 ENCOUNTER — Ambulatory Visit (INDEPENDENT_AMBULATORY_CARE_PROVIDER_SITE_OTHER): Payer: Medicare PPO

## 2022-10-30 ENCOUNTER — Encounter: Payer: Self-pay | Admitting: Nurse Practitioner

## 2022-10-30 VITALS — BP 122/72 | HR 67 | Ht 60.0 in | Wt 234.4 lb

## 2022-10-30 DIAGNOSIS — M940 Chondrocostal junction syndrome [Tietze]: Secondary | ICD-10-CM | POA: Diagnosis not present

## 2022-10-30 DIAGNOSIS — J449 Chronic obstructive pulmonary disease, unspecified: Secondary | ICD-10-CM | POA: Diagnosis not present

## 2022-10-30 DIAGNOSIS — C3432 Malignant neoplasm of lower lobe, left bronchus or lung: Secondary | ICD-10-CM

## 2022-10-30 DIAGNOSIS — J9611 Chronic respiratory failure with hypoxia: Secondary | ICD-10-CM | POA: Diagnosis not present

## 2022-10-30 DIAGNOSIS — J209 Acute bronchitis, unspecified: Secondary | ICD-10-CM | POA: Diagnosis not present

## 2022-10-30 DIAGNOSIS — J440 Chronic obstructive pulmonary disease with acute lower respiratory infection: Secondary | ICD-10-CM

## 2022-10-30 DIAGNOSIS — R072 Precordial pain: Secondary | ICD-10-CM

## 2022-10-30 DIAGNOSIS — R079 Chest pain, unspecified: Secondary | ICD-10-CM | POA: Diagnosis not present

## 2022-10-30 DIAGNOSIS — J439 Emphysema, unspecified: Secondary | ICD-10-CM | POA: Diagnosis not present

## 2022-10-30 MED ORDER — PREDNISONE 20 MG PO TABS
40.0000 mg | ORAL_TABLET | Freq: Every day | ORAL | 0 refills | Status: AC
Start: 2022-10-30 — End: 2022-11-04

## 2022-10-30 MED ORDER — LIDOCAINE 5 % EX PTCH
1.0000 | MEDICATED_PATCH | CUTANEOUS | 0 refills | Status: DC
Start: 2022-10-30 — End: 2024-04-19

## 2022-10-30 MED ORDER — BENZONATATE 200 MG PO CAPS
200.0000 mg | ORAL_CAPSULE | Freq: Three times a day (TID) | ORAL | 1 refills | Status: DC | PRN
Start: 2022-10-30 — End: 2023-05-28

## 2022-10-30 NOTE — Assessment & Plan Note (Addendum)
Symptoms and exam findings consistent with costochondritis. Cardiac workup unrevealing. CXR today without acute process. Low suspicion for PE based on findings/symptoms. We will treat her with prednisone burst and topical analgesic. If no improvement, will consider further workup with CT imaging.   Patient Instructions  Continue Albuterol inhaler 2 puffs or 3 mL neb every 6 hours as needed for shortness of breath or wheezing. Notify if symptoms persist despite rescue inhaler/neb use.  Continue cetirizine 1 tab daily Continue Stiolto 2 puffs daily Continue supplemental oxygen 2-3 lpm for goal oxygen >88-90%  Prednisone 40 mg daily for 5 days. Take with food Benzonatate 1 capsule Three times a day for cough Lidocaine patches - apply a new patch to affected area every 24 hours. Remove in 12 hours.   Follow up in 10-14 days with Dr. Maple Hudson or Philis Nettle. If symptoms do not improve or worsen, please contact office for sooner follow up or seek emergency care.

## 2022-10-30 NOTE — Progress Notes (Signed)
@Patient  ID: [name], female    DOB: [birth year], 77 y.o.   MRN: [id number]  Chief Complaint  Patient presents with   Follow-up    Breathing has been Fair Left pain under left breast can be sharp  O2 2-3 pulsed     Referring provider: Delma Officer, PA  HPI: 77 year old female, former smoker followed for COPD mixed type and chronic respiratory failure with hypoxia on supplemental oxygen.  She is a patient of Dr. Roxy Cedar and last seen in office 08/27/2022.  Past medical history significant for hypertension, IBS, diabetes, obesity, HLD.  She has a history of lung cancer status post left lower lobectomy in 2003.  TEST/EVENTS:  05/03/2017 PFT: FVC 144, FEV1 105, ratio 59, TLC 104, DLCO 59. 05/13/2022 echo: EF 60 to 65%.  RV size and function normal.  Normal diastolic filling parameters.  No significant valvular heart disease. 05/14/2022 CTA chest: No evidence of PE.  No LAD.  Postsurgical changes in the left lower lobe.  High-grade right upper lobe and left lower lobe cystic emphysematous changes again seen.  Interstitial thickening and curvilinear scarring within the postsurgical left lung.  No suspicious pulmonary nodules.  08/27/2022: OV with Dr. Maple Hudson.  She was seen for acute cough in [place] on 1/8 with Dr. Sherene Sires.  Treated with Augmentin and prednisone taper.  Now with minimal occasional cough and clear sputum.  Feels at baseline.  Asked to change from Trelegy to [facility].  She is not sure how much difference either makes.  She is cleared from pulmonary standpoint for cervical spine surgery.  10/30/2022: Today-acute Patient presents today for acute visit.  She has been having trouble with pain under her left breast that is sharp and intermittent.  Notices that it does get worse with certain movements but not necessarily brought on by exercise or exertion.  She can elicit the pain when she presses on the area.  No radiation.  She has had a slight increase in her cough  recently; productive with clear phlegm.  Does not feel like she coughs often but when she does she has a coughing fit.  She had full cardiac workup for the chest discomfort without any significant findings.  Denies any increased shortness of breath, wheezing, chest congestion, fevers, chills, hemoptysis, increased lower extremity swelling.  No injury or trauma that she is aware of.   No Known Allergies  Immunization History  Administered Date(s) Administered   DT (Pediatric) 05/30/2012   Fluad Quad(high Dose 65+) 03/24/2019, 03/26/2020, 03/31/2022   H1N1 06/26/2008   Influenza Split 05/03/2009, 05/11/2011, 05/24/2012, 04/19/2013, 05/15/2014, 03/24/2019   Influenza Whole 05/03/2009   Influenza, High Dose Seasonal PF 05/03/2017, 04/29/2018, 05/15/2021   Influenza, Seasonal, Injecte, Preservative Fre 05/15/2014   Influenza-Unspecified 05/11/2011, 05/24/2012, 04/19/2013, 05/15/2014, 05/21/2015, 03/24/2019, 03/26/2020   Novel Infuenza-h1n1-09 06/26/2008   PFIZER Comirnaty(Gray Top)Covid-19 Tri-Sucrose Vaccine 08/26/2019, 09/16/2019, 03/26/2020, 12/10/2020   Pfizer Covid-19 Vaccine Bivalent Booster 13yrs & up 05/15/2021   Pneumococcal Conjugate-13 07/04/2015   Pneumococcal Polysaccharide-23 01/03/2004   Tdap 03/21/2021   Varicella 06/29/2012, 08/04/2012   Zoster Recombinat (Shingrix) 03/21/2021, 11/18/2021    Past Medical History:  Diagnosis Date   Abdominal pain    around naval   Anxiety    Arthritis    Bipolar disorder    Breast pain in female    Bruises easily    Cancer 2003   left lung   Cervical disc disease    Chronic kidney disease    COPD (  chronic obstructive pulmonary disease)    O2 as needed.   Depression    DJD (degenerative joint disease)    Dyspnea    uses O2 via [place] 2-3L PRN   Gum disease    Hernia    Hypoglycemic episode in patient with diabetes mellitus 2021   Diet controlled diabetes with occasional hypoglycemia at night   Hypothyroidism    no meds   IDA  (iron deficiency anemia) 03/27/2021   in [place]   Insomnia    not currently a problem   Obstructive sleep apnea 08/06/2013   Phlebitis    Pneumonia 03/2022   mild case   Pre-diabetes    diet controlled, no meds   Thyroid disease    Walker as ambulation aid    Wears glasses    Weight increase     Tobacco History: Social History   Tobacco Use  Smoking Status Former   Packs/day: 2.00   Years: 37.00   Additional pack years: 0.00   Total pack years: 74.00   Types: Cigarettes   Quit date: 06/21/2002   Years since quitting: 20.3  Smokeless Tobacco Never   Counseling given: Not Answered   Outpatient Medications Prior to Visit  Medication Sig Dispense Refill   acidophilus (RISAQUAD) CAPS capsule Take 1 capsule by mouth daily.     albuterol (PROVENTIL) (2.5 MG/3ML) 0.083% nebulizer solution USE 1 VIAL IN NEBULIZER EVERY 8 HOURS AS NEEDED FOR WHEEZING OR SHORTNESS OF BREATH 375 mL 11   albuterol (VENTOLIN HFA) 108 (90 Base) MCG/ACT inhaler TAKE 2 PUFFS BY MOUTH EVERY 6 HOURS AS NEEDED FOR WHEEZE OR SHORTNESS OF BREATH 18 each 12   aspirin EC 81 MG tablet Take 1 tablet (81 mg total) by mouth daily. Swallow whole. 30 tablet 11   cetirizine (ZYRTEC) 10 MG tablet Take 10 mg by mouth daily.     chlorpheniramine-HYDROcodone (TUSSIONEX) 10-8 MG/5ML Take 5 mLs by mouth every 12 (twelve) hours as needed for cough. 115 mL 0   Cholecalciferol (VITAMIN D3) 125 MCG (5000 UT) TABS Take 10,000 Units by mouth daily.     Cyanocobalamin (B-12) 5000 MCG CAPS Take 5,000 mcg by mouth daily.     diphenhydrAMINE (BENADRYL) 12.5 MG/5ML liquid 25 mg every 6 (six) hours as needed for allergies or itching.     FREESTYLE INSULINX TEST test strip AS DIRECTED TO OBTAIN BLOOD TWICE A DAY AS NEEDED DX CODE E11.69 90 DAYS     furosemide (LASIX) 20 MG tablet Take 20 mg by mouth daily as needed for edema or fluid.     gabapentin (NEURONTIN) 100 MG capsule Take 3 capsules (300 mg total) by mouth 3 (three) times daily. 90  capsule 1   glucose 4 GM chewable tablet Chew 1-3 tablets by mouth as needed for low blood sugar.     ketoconazole (NIZORAL) 2 % cream Apply 1 Application topically daily as needed for irritation. 60 g 2   Lancets (FREESTYLE) lancets      Menthol-Methyl Salicylate (SALONPAS PAIN RELIEF PATCH) 3-10 % PTCH Place 1 patch onto the skin daily as needed (pain).     Nebulizers (COMPRESSOR/NEBULIZER) MISC Use as directed 1 each prn   olmesartan-hydrochlorothiazide (BENICAR HCT) 20-12.5 MG tablet TAKE 1 TABLET BY MOUTH EVERY DAY (Patient taking differently: Take 1 tablet by mouth every other day.) 30 tablet 0   Polyethyl Glycol-Propyl Glycol (SYSTANE OP) Place 1 drop into both eyes 3 (three) times daily as needed (dry eyes).  Tiotropium Bromide-Olodaterol (STIOLTO RESPIMAT) 2.5-2.5 MCG/ACT AERS Inhale 2 puffs into the lungs daily. 1 each 12   trolamine salicylate (ASPERCREME) 10 % cream Apply 1 Application topically as needed for muscle pain.     Budeson-Glycopyrrol-Formoterol (BREZTRI AEROSPHERE) 160-9-4.8 MCG/ACT AERO Inhale 2 puffs then rinse mouth, twice daily 10.7 g 12   nitroGLYCERIN (NITROSTAT) 0.4 MG SL tablet Place 1 tablet (0.4 mg total) under the tongue every 5 (five) minutes as needed for chest pain. If you require more than two tablets five minutes apart go to the nearest ER via EMS. 30 tablet 0   rosuvastatin (CRESTOR) 10 MG tablet Take 1 tablet (10 mg total) by mouth at bedtime. 90 tablet 0   No facility-administered medications prior to visit.     Review of Systems:   Constitutional: No weight loss or gain, night sweats, fevers, chills, or lassitude. +baseline fatigue  HEENT: No headaches, difficulty swallowing, tooth/dental problems, or sore throat. No sneezing, itching, ear ache, nasal congestion, or post nasal drip CV:  No chest pain, orthopnea, PND, swelling in lower extremities, anasarca, dizziness, palpitations, syncope Resp: +shortness of breath with exertion (baseline);  productive cough (clear phlegm). No hemoptysis. No wheezing.  No chest wall deformity GI:  No heartburn, indigestion, abdominal pain, nausea, vomiting, diarrhea, change in bowel habits, loss of appetite, bloody stools.  GU: No dysuria, change in color of urine, urgency or frequency.   Skin: No rash, lesions, ulcerations MSK:  No joint pain or swelling. +left rib pain  Neuro: No dizziness or lightheadedness.  Psych: No depression or anxiety. Mood stable.     Physical Exam:  BP 122/72 (BP Location: Left Arm, Patient Position: Sitting, Cuff Size: Large)   Pulse 67   Ht 5' (1.524 m)   Wt 234 lb 6.4 oz (106.3 kg)   SpO2 99%   BMI 45.78 kg/m   GEN: Pleasant, interactive, chronically ill appearing; obese; in no acute distress. HEENT:  Normocephalic and atraumatic. PERRLA. Sclera white. Nasal turbinates pink, moist and patent bilaterally. No rhinorrhea present. Oropharynx pink and moist, without exudate or edema. No lesions, ulcerations, or postnasal drip.  NECK:  Supple w/ fair ROM. No JVD present. Normal carotid impulses w/o bruits. Thyroid symmetrical with no goiter or nodules palpated. No lymphadenopathy.   CV: RRR, no m/r/g, no peripheral edema. Pulses intact, +2 bilaterally. No cyanosis, pallor or clubbing. PULMONARY:  Unlabored, regular breathing. Clear bilaterally A&P w/o wheezes/rales/rhonchi. No accessory muscle use.  GI: BS present and normoactive. Soft, non-tender to palpation. No organomegaly or masses detected.  MSK: No erythema, warmth. Cap refil <2 sec all extrem. No deformities or joint swelling noted. Tenderness over anterior 9/10th rib  Neuro: A/Ox3. No focal deficits noted.   Skin: Warm, no lesions or rashe Psych: Normal affect and behavior. Judgement and thought content appropriate.     Lab Results:  CBC    Component Value Date/Time   WBC 6.0 05/28/2022 1423   RBC 5.74 (H) 05/28/2022 1423   HGB 15.6 (H) 05/28/2022 1423   HGB 15.0 01/31/2007 1028   HCT 49.6 (H)  05/28/2022 1423   HCT 44.6 01/31/2007 1028   PLT 187 05/28/2022 1423   PLT 214 01/31/2007 1028   MCV 86.4 05/28/2022 1423   MCV 80.1 (L) 01/31/2007 1028   MCH 27.2 05/28/2022 1423   MCHC 31.5 05/28/2022 1423   RDW 16.0 (H) 05/28/2022 1423   RDW 15.7 (H) 01/31/2007 1028   LYMPHSABS 0.9 10/30/2020 1028   LYMPHSABS 1.0 01/31/2007 1028  MONOABS 0.7 10/30/2020 1028   MONOABS 0.5 01/31/2007 1028   EOSABS 0.1 10/30/2020 1028   EOSABS 0.2 01/31/2007 1028   BASOSABS 0.0 10/30/2020 1028   BASOSABS 0.0 01/31/2007 1028    BMET    Component Value Date/Time   NA 145 (H) 05/12/2022 1608   K 4.7 05/12/2022 1608   CL 103 05/12/2022 1608   CO2 26 05/12/2022 1608   GLUCOSE 76 05/12/2022 1608   GLUCOSE 86 03/31/2022 1513   BUN 17 05/12/2022 1608   CREATININE 1.27 (H) 05/12/2022 1608   CALCIUM 9.6 05/12/2022 1608   GFRNONAA 55 (L) 05/31/2020 1130   GFRAA >60 09/23/2016 1950    BNP No results found for: "BNP"   Imaging:  DG Chest 2 View  Result Date: 10/30/2022 CLINICAL DATA:  left sided pleuritic pain EXAM: CHEST - 2 VIEW COMPARISON:  Radiograph 07/27/2022 FINDINGS: Unchanged cardiomediastinal silhouette. Unchanged chronic peripheral reticulation along the left hemithorax with scarring and/or atelectasis in the left peripheral lung base. Severe apical predominant emphysema, unchanged. Unchanged right lung parenchymal changes. No new airspace disease. Bones are unchanged with multiple chronic left upper rib deformities. Thoracic spondylosis. IMPRESSION: Chronic parenchymal changes without evidence of new focal airspace disease. Unchanged apical predominant emphysema. Electronically Signed   By: Caprice Renshaw M.D.   On: 10/30/2022 14:31    bupivacaine (MARCAINE) 0.25 % (with pres) injection 4 mL     Date Action Dose Route User   09/22/2022 1144 Given 4 mL Intra-articular (Right Shoulder) Eldred Manges, MD      lidocaine (XYLOCAINE) 1 % (with pres) injection 0.5 mL     Date Action Dose  Route User   09/22/2022 1144 Given 0.5 mL Other (Right Shoulder) Eldred Manges, MD      methylPREDNISolone acetate (DEPO-MEDROL) injection 40 mg     Date Action Dose Route User   09/22/2022 1144 Given 40 mg Intra-articular (Right Shoulder) Eldred Manges, MD          Latest Ref Rng & Units 05/03/2017   12:48 PM  PFT Results  FVC-Pre L 2.70   FVC-Predicted Pre % 144   FVC-Post L 2.70   FVC-Predicted Post % 144   Pre FEV1/FVC % % 56   Post FEV1/FCV % % 59   FEV1-Pre L 1.52   FEV1-Predicted Pre % 105   FEV1-Post L 1.58   DLCO uncorrected ml/min/mmHg 11.19   DLCO UNC% % 59   DLCO corrected ml/min/mmHg 11.19   DLCO COR %Predicted % 59   DLVA Predicted % 60   TLC L 4.64   TLC % Predicted % 104   RV % Predicted % 85     No results found for: "NITRICOXIDE"      Assessment & Plan:   Costochondritis Symptoms and exam findings consistent with costochondritis. Cardiac workup unrevealing. CXR today without acute process. Low suspicion for PE based on findings/symptoms. We will treat her with prednisone burst and topical analgesic. If no improvement, will consider further workup with CT imaging.   Patient Instructions  Continue Albuterol inhaler 2 puffs or 3 mL neb every 6 hours as needed for shortness of breath or wheezing. Notify if symptoms persist despite rescue inhaler/neb use.  Continue cetirizine 1 tab daily Continue Stiolto 2 puffs daily Continue supplemental oxygen 2-3 lpm for goal oxygen >88-90%  Prednisone 40 mg daily for 5 days. Take with food Benzonatate 1 capsule Three times a day for cough Lidocaine patches - apply a new patch to  affected area every 24 hours. Remove in 12 hours.   Follow up in 10-14 days with Dr. Maple Hudson or Philis Nettle. If symptoms do not improve or worsen, please contact office for sooner follow up or seek emergency care.    COPD mixed type (HCC) Question mild flare/bronchitis. We will treat her with steroids and cough control regimen. CXR  today without superimposed infection and no significant infectious symptoms so will hold off on antimicrobial therapy for now. She has changed back from [place] to [employer]. Breathing feels unchanged. Action plan in place.   Chronic respiratory failure with hypoxia (HCC) Stable without increased O2 requirement. Goal >88-90%  Malignant neoplasm of lung (HCC) S/p left lower lobectomy 2003. No evidence of recurrence on CT from October 2023. If no improvement in current pain, will obtain CT chest for further evaluation.    I spent 35 minutes of dedicated to the care of this patient on the date of this encounter to include pre-visit review of records, face-to-face time with the patient discussing conditions above, post visit ordering of testing, clinical documentation with the electronic health record, making appropriate referrals as documented, and communicating necessary findings to members of the patients care team.  Noemi Chapel, NP 10/30/2022  Pt aware and understands NP's role.

## 2022-10-30 NOTE — Assessment & Plan Note (Signed)
S/p left lower lobectomy 2003. No evidence of recurrence on CT from October 2023. If no improvement in current pain, will obtain CT chest for further evaluation.

## 2022-10-30 NOTE — Patient Instructions (Addendum)
Continue Albuterol inhaler 2 puffs or 3 mL neb every 6 hours as needed for shortness of breath or wheezing. Notify if symptoms persist despite rescue inhaler/neb use.  Continue cetirizine 1 tab daily Continue Stiolto 2 puffs daily Continue supplemental oxygen 2-3 lpm for goal oxygen >88-90%  Prednisone 40 mg daily for 5 days. Take with food Benzonatate 1 capsule Three times a day for cough Lidocaine patches - apply a new patch to affected area every 24 hours. Remove in 12 hours.   Follow up in 10-14 days with Dr. Maple Hudson or Philis Nettle. If symptoms do not improve or worsen, please contact office for sooner follow up or seek emergency care.

## 2022-10-30 NOTE — Assessment & Plan Note (Signed)
Question mild flare/bronchitis. We will treat her with steroids and cough control regimen. CXR today without superimposed infection and no significant infectious symptoms so will hold off on antimicrobial therapy for now. She has changed back from [place] to [employer]. Breathing feels unchanged. Action plan in place.

## 2022-10-30 NOTE — Assessment & Plan Note (Signed)
Stable without increased O2 requirement. Goal >88-90% 

## 2022-11-02 ENCOUNTER — Telehealth: Payer: Self-pay

## 2022-11-02 DIAGNOSIS — J449 Chronic obstructive pulmonary disease, unspecified: Secondary | ICD-10-CM | POA: Diagnosis not present

## 2022-11-02 DIAGNOSIS — J9611 Chronic respiratory failure with hypoxia: Secondary | ICD-10-CM | POA: Diagnosis not present

## 2022-11-02 NOTE — Telephone Encounter (Signed)
PA request received via CMM for Lidocaine 5% patches  PA submitted to [facility] and is pending determination  Key: [id number]

## 2022-11-03 NOTE — Telephone Encounter (Signed)
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 This is an offlabel use that is not medically accepted. The  Medicare rule in the Prescription Drug Benefit  Manual (Chapter 6, Section 10.6) says a drug must  be used for a medically accepted indication (covered  use). Off-label use is medically accepted when there  is proof in one or more of the drug guides that the  drug works for your condition. We look at the two  major drug guides (compendia): the DRUGDEX  Information System and the [facility]  Formulary Service Drug Information (AHFS-DI).  [employer] has decided that there is no proof in either  drug guide that this drug works for your condition.  Per Medicare rules, it is not covered.

## 2022-11-03 NOTE — Telephone Encounter (Signed)
She can get them OTC at any drugstore, usually around $10 for a pack. Thanks.

## 2022-11-03 NOTE — Telephone Encounter (Signed)
Routing to Katie for her to review. 

## 2022-11-03 NOTE — Telephone Encounter (Signed)
Attempted to call pt but unable to reach. Left message to return call.  

## 2022-11-04 ENCOUNTER — Ambulatory Visit: Payer: Medicare PPO

## 2022-11-04 DIAGNOSIS — R072 Precordial pain: Secondary | ICD-10-CM | POA: Diagnosis not present

## 2022-11-09 DIAGNOSIS — E782 Mixed hyperlipidemia: Secondary | ICD-10-CM | POA: Diagnosis not present

## 2022-11-10 LAB — LIPID PANEL WITH LDL/HDL RATIO
Cholesterol, Total: 163 mg/dL (ref 100–199)
HDL: 64 mg/dL (ref >39)
LDL Chol Calc (NIH): 77 mg/dL (ref 0–99)
LDL/HDL Ratio: 1.2 ratio (ref 0.0–3.2)
Triglycerides: 127 mg/dL (ref 0–149)
VLDL Cholesterol Cal: 22 mg/dL (ref 5–40)

## 2022-11-10 LAB — CMP14+EGFR
ALT: 15 IU/L (ref 0–32)
AST: 24 IU/L (ref 0–40)
Albumin/Globulin Ratio: 2.2 (ref 1.2–2.2)
Albumin: 4.4 g/dL (ref 3.8–4.8)
Alkaline Phosphatase: 72 IU/L (ref 44–121)
BUN/Creatinine Ratio: 27 (ref 12–28)
BUN: 31 mg/dL — ABNORMAL HIGH (ref 8–27)
Bilirubin Total: 0.4 mg/dL (ref 0.0–1.2)
CO2: 26 mmol/L (ref 20–29)
Calcium: 9.3 mg/dL (ref 8.7–10.3)
Chloride: 103 mmol/L (ref 96–106)
Creatinine, Ser: 1.15 mg/dL — ABNORMAL HIGH (ref 0.57–1.00)
Globulin, Total: 2 g/dL (ref 1.5–4.5)
Glucose: 109 mg/dL — ABNORMAL HIGH (ref 70–99)
Potassium: 5 mmol/L (ref 3.5–5.2)
Sodium: 143 mmol/L (ref 134–144)
Total Protein: 6.4 g/dL (ref 6.0–8.5)
eGFR: 49 mL/min/{1.73_m2} — ABNORMAL LOW (ref >59)

## 2022-11-10 LAB — LDL CHOLESTEROL, DIRECT: LDL Direct: 72 mg/dL (ref 0–99)

## 2022-11-11 NOTE — Progress Notes (Signed)
LMTCB

## 2022-11-11 NOTE — Progress Notes (Signed)
LMTCB, there's another result for this patient as well.

## 2022-11-12 ENCOUNTER — Other Ambulatory Visit: Payer: Self-pay | Admitting: Cardiology

## 2022-11-12 DIAGNOSIS — R072 Precordial pain: Secondary | ICD-10-CM

## 2022-11-13 NOTE — Progress Notes (Signed)
Called patient to inform her about her echo results. Patient understood

## 2022-11-13 NOTE — Progress Notes (Signed)
Called patient to inform her about her lab results. Patient understood

## 2022-11-23 ENCOUNTER — Ambulatory Visit (INDEPENDENT_AMBULATORY_CARE_PROVIDER_SITE_OTHER): Payer: Medicare PPO | Admitting: Nurse Practitioner

## 2022-11-23 ENCOUNTER — Encounter: Payer: Self-pay | Admitting: Nurse Practitioner

## 2022-11-23 ENCOUNTER — Encounter: Payer: Self-pay | Admitting: Podiatry

## 2022-11-23 ENCOUNTER — Ambulatory Visit (INDEPENDENT_AMBULATORY_CARE_PROVIDER_SITE_OTHER): Payer: Medicare PPO | Admitting: Podiatry

## 2022-11-23 VITALS — BP 136/62 | HR 68 | Temp 98.1°F | Ht 60.0 in | Wt 233.8 lb

## 2022-11-23 DIAGNOSIS — E1151 Type 2 diabetes mellitus with diabetic peripheral angiopathy without gangrene: Secondary | ICD-10-CM | POA: Diagnosis not present

## 2022-11-23 DIAGNOSIS — R0789 Other chest pain: Secondary | ICD-10-CM

## 2022-11-23 DIAGNOSIS — M545 Low back pain, unspecified: Secondary | ICD-10-CM

## 2022-11-23 DIAGNOSIS — G8929 Other chronic pain: Secondary | ICD-10-CM

## 2022-11-23 DIAGNOSIS — J449 Chronic obstructive pulmonary disease, unspecified: Secondary | ICD-10-CM | POA: Diagnosis not present

## 2022-11-23 DIAGNOSIS — C3432 Malignant neoplasm of lower lobe, left bronchus or lung: Secondary | ICD-10-CM

## 2022-11-23 DIAGNOSIS — F320 Major depressive disorder, single episode, mild: Secondary | ICD-10-CM

## 2022-11-23 DIAGNOSIS — B351 Tinea unguium: Secondary | ICD-10-CM

## 2022-11-23 DIAGNOSIS — M79676 Pain in unspecified toe(s): Secondary | ICD-10-CM | POA: Diagnosis not present

## 2022-11-23 DIAGNOSIS — Z85118 Personal history of other malignant neoplasm of bronchus and lung: Secondary | ICD-10-CM | POA: Diagnosis not present

## 2022-11-23 DIAGNOSIS — J9611 Chronic respiratory failure with hypoxia: Secondary | ICD-10-CM | POA: Diagnosis not present

## 2022-11-23 NOTE — Assessment & Plan Note (Signed)
Stable without increased O2 requirement. She was on room air during her visit. Continue with activity as needed to maintain saturations >88-90%

## 2022-11-23 NOTE — Assessment & Plan Note (Signed)
Appears compensated on current regimen. Cough returned to baseline. DOE multifactorial related to COPD, obesity, deconditioning. Action plan in place.   Patient Instructions  Continue Albuterol inhaler 2 puffs or 3 mL neb every 6 hours as needed for shortness of breath or wheezing. Notify if symptoms persist despite rescue inhaler/neb use.  Continue cetirizine 1 tab daily Continue Stiolto 2 puffs daily Continue supplemental oxygen 2-3 lpm for goal oxygen >88-90%  CT chest ordered today given your persistent chest wall pain - someone will contact you to schedule this  Discuss physical therapy with your PCP for your back    Follow up in 3-4 weeks with Dr. Maple Hudson (1st) or Katie Marayah Higdon,NP to discuss CT scan results. If symptoms do not improve or worsen, please contact office for sooner follow up or seek emergency care.

## 2022-11-23 NOTE — Patient Instructions (Addendum)
Continue Albuterol inhaler 2 puffs or 3 mL neb every 6 hours as needed for shortness of breath or wheezing. Notify if symptoms persist despite rescue inhaler/neb use.  Continue cetirizine 1 tab daily Continue Stiolto 2 puffs daily Continue supplemental oxygen 2-3 lpm for goal oxygen >88-90%  CT chest ordered today given your persistent chest wall pain - someone will contact you to schedule this  Discuss physical therapy with your PCP for your back    Follow up in 3-4 weeks with Dr. Maple Hudson (1st) or Katie Bellanie Matthew,NP to discuss CT scan results. If symptoms do not improve or worsen, please contact office for sooner follow up or seek emergency care.

## 2022-11-23 NOTE — Progress Notes (Signed)
  Subjective:  Patient ID: [name], female    DOB: 05/09/1946,   MRN: [id number]  No chief complaint on file.   77 y.o. female presents for diabetic routine foot care. Relates thick elongated and painful toenails that she cannot trim herself. Relates she is diabetic and last A1c was 6.1 on 12.8.22 .  Denies any other pedal complaints. Denies n/v/f/c.   PCP Renford Dills MD   Past Medical History:  Diagnosis Date   Abdominal pain    around naval   Anxiety    Arthritis    Bipolar disorder [facility])    Breast pain in female    Bruises easily    Cancer (HCC) 2003   left lung   Cervical disc disease    Chronic kidney disease    COPD (chronic obstructive pulmonary disease) (HCC)    O2 as needed.   Depression    DJD (degenerative joint disease)    Dyspnea    uses O2 via [place] 2-3L PRN   Gum disease    Hernia    Hypoglycemic episode in patient with diabetes mellitus (HCC) 2021   Diet controlled diabetes with occasional hypoglycemia at night   Hypothyroidism    no meds   IDA (iron deficiency anemia) 03/27/2021   in [place]   Insomnia    not currently a problem   Obstructive sleep apnea 08/06/2013   Phlebitis    Pneumonia 03/2022   mild case   Pre-diabetes    diet controlled, no meds   Thyroid disease    Walker as ambulation aid    Wears glasses    Weight increase     Objective:  Physical Exam: Vascular: DP/PT pulses 2/4 bilateral. CFT <3 seconds. Normal hair growth on digits. No edema.  Skin. No lacerations or abrasions bilateral feet. Nails 1-5 are thickened discolored and elongated with subungual debris.  Musculoskeletal: MMT 5/5 bilateral lower extremities in DF, PF, Inversion and Eversion. Deceased ROM in DF of ankle joint.  Neurological: Sensation intact to light touch.   Assessment:   1. Pain due to onychomycosis of toenail   2. Type 2 diabetes mellitus with peripheral vascular disease (HCC)        Plan:  Patient was evaluated and treated and all  questions answered. -Discussed and educated patient on diabetic foot care, especially with  regards to the vascular, neurological and musculoskeletal systems.  -Stressed the importance of good glycemic control and the detriment of not  controlling glucose levels in relation to the foot. -Discussed supportive shoes at all times and checking feet regularly.  -Mechanically debrided all nails 1-5 bilateral using sterile nail nipper and filed with dremel without incident  -Answered all patient questions -Patient to return  in 3 months for at risk foot care -Patient advised to call the office if any problems or questions arise in the meantime.   Louann Sjogren, DPM

## 2022-11-23 NOTE — Assessment & Plan Note (Signed)
No evidence of recurrence on CTA from October 2023. She has had chest wall pain intermittently since January 2024. Plain films unrevealing. No improvement with steroids or supportive care. Cardiac workup unremarkable. Given her history of lung cancer and lack of improvement, will obtain CT chest for further evaluation.

## 2022-11-23 NOTE — Progress Notes (Unsigned)
@Patient  ID: [name], female    DOB: Dec 27, 1945, 77 y.o.   MRN: [id number]  Chief Complaint  Patient presents with   Follow-up    States DOE, nonproductive cough at times    Referring provider: Delma Officer, PA  HPI: 77 year old female, former smoker followed for COPD mixed type and chronic respiratory failure with hypoxia on supplemental oxygen.  She is a patient of Dr. Roxy Cedar and last seen in office 10/30/2022 by [facility] NP.  Past medical history significant for hypertension, IBS, diabetes, obesity, HLD.  She has a history of lung cancer status post left lower lobectomy in 2003.  TEST/EVENTS:  05/03/2017 PFT: FVC 144, FEV1 105, ratio 59, TLC 104, DLCO 59. 05/13/2022 echo: EF 60 to 65%.  RV size and function normal.  Normal diastolic filling parameters.  No significant valvular heart disease. 05/14/2022 CTA chest: No evidence of PE.  No LAD.  Postsurgical changes in the left lower lobe.  High-grade right upper lobe and left lower lobe cystic emphysematous changes again seen.  Interstitial thickening and curvilinear scarring within the postsurgical left lung.  No suspicious pulmonary nodules.  08/27/2022: OV with Dr. Maple Hudson.  She was seen for acute cough in [place] on 1/8 with Dr. Sherene Sires.  Treated with Augmentin and prednisone taper.  Now with minimal occasional cough and clear sputum.  Feels at baseline.  Asked to change from Trelegy to [facility].  She is not sure how much difference either makes.  She is cleared from pulmonary standpoint for cervical spine surgery.  10/30/2022: OV with Alesandra Smart NP for acute visit.  She has been having trouble with pain under her left breast that is sharp and intermittent.  Notices that it does get worse with certain movements but not necessarily brought on by exercise or exertion.  She can elicit the pain when she presses on the area.  No radiation.  She has had a slight increase in her cough recently; productive with clear phlegm.  Does not feel  like she coughs often but when she does she has a coughing fit.  She had full cardiac workup for the chest discomfort without any significant findings.  Denies any increased shortness of breath, wheezing, chest congestion, fevers, chills, hemoptysis, increased lower extremity swelling.  No injury or trauma that she is aware of.  11/23/2022: Today - follow up Patient presents today for follow up. At our last visit, we treated her for possible costochondritis and increased cough with short prednisone burst and supportive care measures. Her cough has returned to her baseline. Didn't really notice any change or improvement in the left sided chest wall pain. Pain is still intermittent, worse with certain movements. She has already had full cardiac workup without any significant findings. CXR at her last visit did not show any acute process. She is worried because this is the same side as her previous lung cancer. She had a mammogram last year, which was unremarkable. She has chronic back and knee pain, which limit her mobility. Breathing is at her baseline. Denies any hemoptysis, weight loss, anorexia, chest congestion, wheezing.  She has some trouble with her mood. She feels like ever since she stopped working, she doesn't have much purpose. She has consider picking up a few hours of work a week. Her daughter has also encouraged her to see about participating in a local older adults group but she is worried that she wouldn't be able to participate with her knee and back problems. She denies  any SI/HI or anxiety. She has plans to discuss this further with her PCP when she sees her in the next few weeks.   No Known Allergies  Immunization History  Administered Date(s) Administered   DT (Pediatric) 05/30/2012   Fluad Quad(high Dose 65+) 03/24/2019, 03/26/2020, 03/31/2022   H1N1 06/26/2008   Influenza Split 05/03/2009, 05/11/2011, 05/24/2012, 04/19/2013, 05/15/2014, 03/24/2019   Influenza Whole 05/03/2009    Influenza, High Dose Seasonal PF 05/03/2017, 04/29/2018, 05/15/2021   Influenza, Seasonal, Injecte, Preservative Fre 05/15/2014   Influenza-Unspecified 05/11/2011, 05/24/2012, 04/19/2013, 05/15/2014, 05/21/2015, 03/24/2019, 03/26/2020   Novel Infuenza-h1n1-09 06/26/2008   PFIZER Comirnaty(Gray Top)Covid-19 Tri-Sucrose Vaccine 08/26/2019, 09/16/2019, 03/26/2020, 12/10/2020   Pfizer Covid-19 Vaccine Bivalent Booster 56yrs & up 05/15/2021   Pneumococcal Conjugate-13 07/04/2015   Pneumococcal Polysaccharide-23 01/03/2004   Tdap 03/21/2021   Varicella 06/29/2012, 08/04/2012   Zoster Recombinat (Shingrix) 03/21/2021, 11/18/2021    Past Medical History:  Diagnosis Date   Abdominal pain    around naval   Anxiety    Arthritis    Bipolar disorder (HCC)    Breast pain in female    Bruises easily    Cancer (HCC) 2003   left lung   Cervical disc disease    Chronic kidney disease    COPD (chronic obstructive pulmonary disease) (HCC)    O2 as needed.   Depression    DJD (degenerative joint disease)    Dyspnea    uses O2 via  2-3L PRN   Gum disease    Hernia    Hypoglycemic episode in patient with diabetes mellitus (HCC) 2021   Diet controlled diabetes with occasional hypoglycemia at night   Hypothyroidism    no meds   IDA (iron deficiency anemia) 03/27/2021   in [place]   Insomnia    not currently a problem   Obstructive sleep apnea 08/06/2013   Phlebitis    Pneumonia 03/2022   mild case   Pre-diabetes    diet controlled, no meds   Thyroid disease    Walker as ambulation aid    Wears glasses    Weight increase     Tobacco History: Social History   Tobacco Use  Smoking Status Former   Packs/day: 2.00   Years: 37.00   Additional pack years: 0.00   Total pack years: 74.00   Types: Cigarettes   Quit date: 06/21/2002   Years since quitting: 20.4  Smokeless Tobacco Never   Counseling given: Not Answered   Outpatient Medications Prior to Visit  Medication Sig Dispense  Refill   acidophilus (RISAQUAD) CAPS capsule Take 1 capsule by mouth daily. As needed     albuterol (PROVENTIL) (2.5 MG/3ML) 0.083% nebulizer solution USE 1 VIAL IN NEBULIZER EVERY 8 HOURS AS NEEDED FOR WHEEZING OR SHORTNESS OF BREATH 375 mL 11   albuterol (VENTOLIN HFA) 108 (90 Base) MCG/ACT inhaler TAKE 2 PUFFS BY MOUTH EVERY 6 HOURS AS NEEDED FOR WHEEZE OR SHORTNESS OF BREATH 18 each 12   benzonatate (TESSALON) 200 MG capsule Take 1 capsule (200 mg total) by mouth 3 (three) times daily as needed for cough. 30 capsule 1   cetirizine (ZYRTEC) 10 MG tablet Take 10 mg by mouth daily.     chlorpheniramine-HYDROcodone (TUSSIONEX) 10-8 MG/5ML Take 5 mLs by mouth every 12 (twelve) hours as needed for cough. 115 mL 0   Cholecalciferol (VITAMIN D3) 125 MCG (5000 UT) TABS Take 10,000 Units by mouth daily.     [employer] ASPIRIN LOW DOSE 81 MG tablet TAKE 1 TABLET (  81 MG TOTAL) BY MOUTH DAILY. SWALLOW WHOLE. 90 tablet 3   Cyanocobalamin (B-12) 5000 MCG CAPS Take 5,000 mcg by mouth daily.     diphenhydrAMINE (BENADRYL) 12.5 MG/5ML liquid 25 mg every 6 (six) hours as needed for allergies or itching.     FREESTYLE INSULINX TEST test strip AS DIRECTED TO OBTAIN BLOOD TWICE A DAY AS NEEDED DX CODE E11.69 90 DAYS     furosemide (LASIX) 20 MG tablet Take 20 mg by mouth daily as needed for edema or fluid.     gabapentin (NEURONTIN) 100 MG capsule Take 3 capsules (300 mg total) by mouth 3 (three) times daily. 90 capsule 1   glucose 4 GM chewable tablet Chew 1-3 tablets by mouth as needed for low blood sugar.     ketoconazole (NIZORAL) 2 % cream Apply 1 Application topically daily as needed for irritation. 60 g 2   Lancets (FREESTYLE) lancets      lidocaine (LIDODERM) 5 % Place 1 patch onto the skin daily. Remove & Discard patch within 12 hours or as directed by MD 30 patch 0   Menthol-Methyl Salicylate (SALONPAS PAIN RELIEF PATCH) 3-10 % PTCH Place 1 patch onto the skin daily as needed (pain).     Nebulizers  (COMPRESSOR/NEBULIZER) MISC Use as directed 1 each prn   olmesartan-hydrochlorothiazide (BENICAR HCT) 20-12.5 MG tablet TAKE 1 TABLET BY MOUTH EVERY DAY (Patient taking differently: Take 1 tablet by mouth every other day.) 30 tablet 0   Polyethyl Glycol-Propyl Glycol (SYSTANE OP) Place 1 drop into both eyes 3 (three) times daily as needed (dry eyes).     Tiotropium Bromide-Olodaterol (STIOLTO RESPIMAT) 2.5-2.5 MCG/ACT AERS Inhale 2 puffs into the lungs daily. 1 each 12   trolamine salicylate (ASPERCREME) 10 % cream Apply 1 Application topically as needed for muscle pain.     nitroGLYCERIN (NITROSTAT) 0.4 MG SL tablet Place 1 tablet (0.4 mg total) under the tongue every 5 (five) minutes as needed for chest pain. If you require more than two tablets five minutes apart go to the nearest ER via EMS. 30 tablet 0   rosuvastatin (CRESTOR) 10 MG tablet Take 1 tablet (10 mg total) by mouth at bedtime. 90 tablet 0   No facility-administered medications prior to visit.     Review of Systems:   Constitutional: No weight loss or gain, night sweats, fevers, chills, or lassitude. +baseline fatigue  HEENT: No headaches, difficulty swallowing, tooth/dental problems, or sore throat. No sneezing, itching, ear ache, nasal congestion, or post nasal drip CV:  No chest pain, orthopnea, PND, swelling in lower extremities, anasarca, dizziness, palpitations, syncope Resp: +shortness of breath with exertion (baseline); minimal cough. No hemoptysis. No wheezing.  No chest wall deformity GI:  No heartburn, indigestion, abdominal pain, nausea, vomiting, diarrhea, change in bowel habits, loss of appetite, bloody stools.  GU: No dysuria, change in color of urine, urgency or frequency.   Skin: No rash, lesions, ulcerations MSK:  No joint pain or swelling. +left rib/chest wall pain; chronic back pain Neuro: No dizziness or lightheadedness.  Psych: No depression or anxiety. Mood stable.     Physical Exam:  BP 136/62 (BP  Location: Right Arm, Patient Position: Sitting, Cuff Size: Normal)   Pulse 68   Temp 98.1 F (36.7 C) (Oral)   Ht 5' (1.524 m)   Wt 233 lb 12.8 oz (106.1 kg)   SpO2 98% Comment: RA  BMI 45.66 kg/m   GEN: Pleasant, interactive, chronically ill appearing; obese; in no acute  distress. HEENT:  Normocephalic and atraumatic. PERRLA. Sclera white. Nasal turbinates pink, moist and patent bilaterally. No rhinorrhea present. Oropharynx pink and moist, without exudate or edema. No lesions, ulcerations, or postnasal drip.  NECK:  Supple w/ fair ROM. No JVD present. Normal carotid impulses w/o bruits. Thyroid symmetrical with no goiter or nodules palpated. No lymphadenopathy.   CV: RRR, no m/r/g, no peripheral edema. Pulses intact, +2 bilaterally. No cyanosis, pallor or clubbing. PULMONARY:  Unlabored, regular breathing. Clear bilaterally A&P w/o wheezes/rales/rhonchi. No accessory muscle use.  GI: BS present and normoactive. Soft, non-tender to palpation. No organomegaly or masses detected.  MSK: No erythema, warmth. Cap refil <2 sec all extrem. No deformities or joint swelling noted. Tenderness over anterior 9/10th rib  Neuro: A/Ox3. No focal deficits noted.   Skin: Warm, no lesions or rashe Psych: Normal affect and behavior. Judgement and thought content appropriate.     Lab Results:  CBC    Component Value Date/Time   WBC 6.0 05/28/2022 1423   RBC 5.74 (H) 05/28/2022 1423   HGB 15.6 (H) 05/28/2022 1423   HGB 15.0 01/31/2007 1028   HCT 49.6 (H) 05/28/2022 1423   HCT 44.6 01/31/2007 1028   PLT 187 05/28/2022 1423   PLT 214 01/31/2007 1028   MCV 86.4 05/28/2022 1423   MCV 80.1 (L) 01/31/2007 1028   MCH 27.2 05/28/2022 1423   MCHC 31.5 05/28/2022 1423   RDW 16.0 (H) 05/28/2022 1423   RDW 15.7 (H) 01/31/2007 1028   LYMPHSABS 0.9 10/30/2020 1028   LYMPHSABS 1.0 01/31/2007 1028   MONOABS 0.7 10/30/2020 1028   MONOABS 0.5 01/31/2007 1028   EOSABS 0.1 10/30/2020 1028   EOSABS 0.2  01/31/2007 1028   BASOSABS 0.0 10/30/2020 1028   BASOSABS 0.0 01/31/2007 1028    BMET    Component Value Date/Time   NA 143 11/09/2022 1121   K 5.0 11/09/2022 1121   CL 103 11/09/2022 1121   CO2 26 11/09/2022 1121   GLUCOSE 109 (H) 11/09/2022 1121   GLUCOSE 86 03/31/2022 1513   BUN 31 (H) 11/09/2022 1121   CREATININE 1.15 (H) 11/09/2022 1121   CALCIUM 9.3 11/09/2022 1121   GFRNONAA 55 (L) 05/31/2020 1130   GFRAA >60 09/23/2016 1950    BNP No results found for: "BNP"   Imaging:  PCV ECHOCARDIOGRAM COMPLETE  Result Date: 11/06/2022 Echocardiogram 11/04/2022: Normal LV systolic function with visual EF 60-65%. Left ventricle cavity is normal in size. Normal left ventricular wall thickness. Normal global wall motion. Normal diastolic filling pattern, normal LAP. Calculated EF 69%. Structurally normal tricuspid valve with trace regurgitation. No evidence of pulmonary hypertension. No significant change compared to 04/2022.   DG Chest 2 View  Result Date: 10/30/2022 CLINICAL DATA:  left sided pleuritic pain EXAM: CHEST - 2 VIEW COMPARISON:  Radiograph 07/27/2022 FINDINGS: Unchanged cardiomediastinal silhouette. Unchanged chronic peripheral reticulation along the left hemithorax with scarring and/or atelectasis in the left peripheral lung base. Severe apical predominant emphysema, unchanged. Unchanged right lung parenchymal changes. No new airspace disease. Bones are unchanged with multiple chronic left upper rib deformities. Thoracic spondylosis. IMPRESSION: Chronic parenchymal changes without evidence of new focal airspace disease. Unchanged apical predominant emphysema. Electronically Signed   By: Caprice Renshaw M.D.   On: 10/30/2022 14:31          Latest Ref Rng & Units 05/03/2017   12:48 PM  PFT Results  FVC-Pre L 2.70   FVC-Predicted Pre % 144   FVC-Post L 2.70  FVC-Predicted Post % 144   Pre FEV1/FVC % % 56   Post FEV1/FCV % % 59   FEV1-Pre L 1.52   FEV1-Predicted Pre %  105   FEV1-Post L 1.58   DLCO uncorrected ml/min/mmHg 11.19   DLCO UNC% % 59   DLCO corrected ml/min/mmHg 11.19   DLCO COR %Predicted % 59   DLVA Predicted % 60   TLC L 4.64   TLC % Predicted % 104   RV % Predicted % 85     No results found for: "NITRICOXIDE"      Assessment & Plan:   COPD mixed type (HCC) Appears compensated on current regimen. Cough returned to baseline. DOE multifactorial related to COPD, obesity, deconditioning. Action plan in place.   Patient Instructions  Continue Albuterol inhaler 2 puffs or 3 mL neb every 6 hours as needed for shortness of breath or wheezing. Notify if symptoms persist despite rescue inhaler/neb use.  Continue cetirizine 1 tab daily Continue Stiolto 2 puffs daily Continue supplemental oxygen 2-3 lpm for goal oxygen >88-90%  CT chest ordered today given your persistent chest wall pain - someone will contact you to schedule this  Discuss physical therapy with your PCP for your back    Follow up in 3-4 weeks with Dr. Maple Hudson (1st) or Katie Kenishia Plack,NP to discuss CT scan results. If symptoms do not improve or worsen, please contact office for sooner follow up or seek emergency care.   Chronic respiratory failure with hypoxia (HCC) Stable without increased O2 requirement. She was on room air during her visit. Continue with activity as needed to maintain saturations >88-90%  Malignant neoplasm of lung (HCC) No evidence of recurrence on CTA from October 2023. She has had chest wall pain intermittently since January 2024. Plain films unrevealing aside from chronic deformities of the left ribs. No improvement with steroids or supportive care. Cardiac workup unremarkable. Given her history of lung cancer and lack of improvement, will obtain CT chest for further evaluation.   Chest wall pain Possible this is MSK in nature but with her history, further workup warranted. See above. Last mammo was a year ago, per her report. If CT unrevealing, could  consider repeat given location of pain. No suspicious lumps/masses or nipple discharge/changes.   Chronic back pain Chronic back pain. Not a surgical candidate per Dr. Ophelia Charter. Advised she consider PT. She will discuss this with her PCP and NSG.   Depression Mild depression with feeling of lack of purpose. No SI/HI. We had lengthy discussion surrounding this. She is going to consider local activity groups again. She does not want to take any medications at this time. She will discuss further with her PCP if symptoms don't improve.     I spent 45 minutes of dedicated to the care of this patient on the date of this encounter to include pre-visit review of records, face-to-face time with the patient discussing conditions above, post visit ordering of testing, clinical documentation with the electronic health record, making appropriate referrals as documented, and communicating necessary findings to members of the patients care team.  Noemi Chapel, NP 11/24/2022  Pt aware and understands NP's role.

## 2022-11-24 ENCOUNTER — Encounter: Payer: Self-pay | Admitting: Nurse Practitioner

## 2022-11-24 ENCOUNTER — Ambulatory Visit: Payer: Medicare PPO | Admitting: Cardiology

## 2022-11-24 DIAGNOSIS — G8929 Other chronic pain: Secondary | ICD-10-CM | POA: Insufficient documentation

## 2022-11-24 DIAGNOSIS — F32A Depression, unspecified: Secondary | ICD-10-CM | POA: Insufficient documentation

## 2022-11-24 DIAGNOSIS — R0789 Other chest pain: Secondary | ICD-10-CM | POA: Insufficient documentation

## 2022-11-24 DIAGNOSIS — R0781 Pleurodynia: Secondary | ICD-10-CM | POA: Insufficient documentation

## 2022-11-24 NOTE — Assessment & Plan Note (Addendum)
Possible this is MSK in nature but with her history, further workup warranted. See above. Last mammo was a year ago, per her report. If CT unrevealing, could consider repeat given location of pain. No suspicious lumps/masses or nipple discharge/changes.

## 2022-11-24 NOTE — Assessment & Plan Note (Signed)
Chronic back pain. Not a surgical candidate per Dr. Ophelia Charter. Advised she consider PT. She will discuss this with her PCP and NSG.

## 2022-11-24 NOTE — Assessment & Plan Note (Signed)
Mild depression with feeling of lack of purpose. No SI/HI. We had lengthy discussion surrounding this. She is going to consider local activity groups again. She does not want to take any medications at this time. She will discuss further with her PCP if symptoms don't improve.

## 2022-11-30 ENCOUNTER — Telehealth: Payer: Self-pay | Admitting: Nurse Practitioner

## 2022-12-01 ENCOUNTER — Ambulatory Visit: Payer: Medicare PPO

## 2022-12-02 DIAGNOSIS — J9611 Chronic respiratory failure with hypoxia: Secondary | ICD-10-CM | POA: Diagnosis not present

## 2022-12-02 DIAGNOSIS — J449 Chronic obstructive pulmonary disease, unspecified: Secondary | ICD-10-CM | POA: Diagnosis not present

## 2022-12-04 DIAGNOSIS — E1169 Type 2 diabetes mellitus with other specified complication: Secondary | ICD-10-CM | POA: Diagnosis not present

## 2022-12-04 DIAGNOSIS — M5442 Lumbago with sciatica, left side: Secondary | ICD-10-CM | POA: Diagnosis not present

## 2022-12-04 DIAGNOSIS — G8929 Other chronic pain: Secondary | ICD-10-CM | POA: Diagnosis not present

## 2022-12-04 DIAGNOSIS — Z85118 Personal history of other malignant neoplasm of bronchus and lung: Secondary | ICD-10-CM | POA: Diagnosis not present

## 2022-12-04 DIAGNOSIS — E7800 Pure hypercholesterolemia, unspecified: Secondary | ICD-10-CM | POA: Diagnosis not present

## 2022-12-04 DIAGNOSIS — M5441 Lumbago with sciatica, right side: Secondary | ICD-10-CM | POA: Diagnosis not present

## 2022-12-04 DIAGNOSIS — Z6841 Body Mass Index (BMI) 40.0 and over, adult: Secondary | ICD-10-CM | POA: Diagnosis not present

## 2022-12-04 DIAGNOSIS — E11649 Type 2 diabetes mellitus with hypoglycemia without coma: Secondary | ICD-10-CM | POA: Diagnosis not present

## 2022-12-05 ENCOUNTER — Other Ambulatory Visit: Payer: Self-pay | Admitting: Internal Medicine

## 2022-12-10 ENCOUNTER — Ambulatory Visit
Admission: RE | Admit: 2022-12-10 | Discharge: 2022-12-10 | Disposition: A | Discharge status: Home / Self Care | Payer: Medicare PPO | Source: Ambulatory Visit | Attending: Nurse Practitioner | Admitting: Nurse Practitioner

## 2022-12-10 DIAGNOSIS — R0789 Other chest pain: Secondary | ICD-10-CM | POA: Diagnosis not present

## 2022-12-10 DIAGNOSIS — I700 Atherosclerosis of aorta: Secondary | ICD-10-CM | POA: Diagnosis not present

## 2022-12-10 DIAGNOSIS — Z85118 Personal history of other malignant neoplasm of bronchus and lung: Secondary | ICD-10-CM

## 2022-12-10 DIAGNOSIS — J439 Emphysema, unspecified: Secondary | ICD-10-CM | POA: Diagnosis not present

## 2022-12-15 ENCOUNTER — Ambulatory Visit: Payer: Medicare PPO | Admitting: Podiatry

## 2022-12-15 ENCOUNTER — Ambulatory Visit (INDEPENDENT_AMBULATORY_CARE_PROVIDER_SITE_OTHER): Payer: Medicare PPO | Admitting: Nurse Practitioner

## 2022-12-15 ENCOUNTER — Encounter: Payer: Self-pay | Admitting: Nurse Practitioner

## 2022-12-15 ENCOUNTER — Other Ambulatory Visit: Payer: Self-pay | Admitting: Nurse Practitioner

## 2022-12-15 VITALS — BP 116/66 | HR 68 | Temp 97.9°F | Ht 60.0 in | Wt 230.8 lb

## 2022-12-15 DIAGNOSIS — N644 Mastodynia: Secondary | ICD-10-CM

## 2022-12-15 DIAGNOSIS — J449 Chronic obstructive pulmonary disease, unspecified: Secondary | ICD-10-CM

## 2022-12-15 DIAGNOSIS — J9611 Chronic respiratory failure with hypoxia: Secondary | ICD-10-CM | POA: Diagnosis not present

## 2022-12-15 DIAGNOSIS — R0789 Other chest pain: Secondary | ICD-10-CM

## 2022-12-15 NOTE — Patient Instructions (Addendum)
Continue Albuterol inhaler 2 puffs or 3 mL neb every 6 hours as needed for shortness of breath or wheezing. Notify if symptoms persist despite rescue inhaler/neb use.  Continue cetirizine 1 tab daily Continue Stiolto 2 puffs daily Continue supplemental oxygen 2-3 lpm for goal oxygen >88-90%  Diagnostic mammogram ordered given your persistent pain and unexplained cause - someone will contact you to schedule this  Referral to orthopedics in case mammogram is negative   Follow up with your PCP about the area on your kidney and MRI imaging - I have sent them a message as well.   Follow up with Dr. Maple Hudson as scheduled. If symptoms do not improve or worsen, please contact office for sooner follow up or seek emergency care.

## 2022-12-15 NOTE — Progress Notes (Signed)
@Patient  ID: [name], female    DOB: 04-22-1946, 77 y.o.   MRN: [id number]  Chief Complaint  Patient presents with   Follow-up    Ct results, no other c/o    Referring provider: Delma Officer, PA  HPI: 77 year old female, former smoker followed for COPD mixed type and chronic respiratory failure with hypoxia on supplemental oxygen.  She is a patient of Dr. Roxy Cedar and last seen in office 11/23/2022 by [facility] NP.  Past medical history significant for hypertension, IBS, diabetes, obesity, HLD.  She has a history of lung cancer status post left lower lobectomy in 2003.  TEST/EVENTS:  05/03/2017 PFT: FVC 144, FEV1 105, ratio 59, TLC 104, DLCO 59. 05/13/2022 echo: EF 60 to 65%.  RV size and function normal.  Normal diastolic filling parameters.  No significant valvular heart disease. 05/14/2022 CTA chest: No evidence of PE.  No LAD.  Postsurgical changes in the left lower lobe.  High-grade right upper lobe and left lower lobe cystic emphysematous changes again seen.  Interstitial thickening and curvilinear scarring within the postsurgical left lung.  No suspicious pulmonary nodules. 11/04/2022 echo: EF 60-65%. Normal diastolic filling pattern. No evidence of PH.  12/10/2022 CT chest wo con: prior left lobectomy. Remaining airways patent. Severe centrilobular emphysema. Hyperdense exophytic lesion of mid region of the kidney, measuring 5 mm. Partially visualized indeterminate calcified lesion of kidney measuring 2.1 cm. Atherosclerosis.   08/27/2022: OV with Dr. Maple Hudson.  She was seen for acute cough in [place] on 1/8 with Dr. Sherene Sires.  Treated with Augmentin and prednisone taper.  Now with minimal occasional cough and clear sputum.  Feels at baseline.  Asked to change from Trelegy to [facility].  She is not sure how much difference either makes.  She is cleared from pulmonary standpoint for cervical spine surgery.  10/30/2022: OV with Mckenzey Parcell NP for acute visit.  She has been having trouble  with pain under her left breast that is sharp and intermittent.  Notices that it does get worse with certain movements but not necessarily brought on by exercise or exertion.  She can elicit the pain when she presses on the area.  No radiation.  She has had a slight increase in her cough recently; productive with clear phlegm.  Does not feel like she coughs often but when she does she has a coughing fit.  She had full cardiac workup for the chest discomfort without any significant findings.  Denies any increased shortness of breath, wheezing, chest congestion, fevers, chills, hemoptysis, increased lower extremity swelling.  No injury or trauma that she is aware of.  11/23/2022: OV with Kostas Marrow NP for follow up. At our last visit, we treated her for possible costochondritis and increased cough with short prednisone burst and supportive care measures. Her cough has returned to her baseline. Didn't really notice any change or improvement in the left sided chest wall pain. Pain is still intermittent, worse with certain movements. She has already had full cardiac workup without any significant findings. CXR at her last visit did not show any acute process. She is worried because this is the same side as her previous lung cancer. She had a mammogram last year, which was unremarkable. She has chronic back and knee pain, which limit her mobility. Breathing is at her baseline. Denies any hemoptysis, weight loss, anorexia, chest congestion, wheezing.  She has some trouble with her mood. She feels like ever since she stopped working, she doesn't have much purpose.  She has consider picking up a few hours of work a week. Her daughter has also encouraged her to see about participating in a local older adults group but she is worried that she wouldn't be able to participate with her knee and back problems. She denies any SI/HI or anxiety. She has plans to discuss this further with her PCP when she sees her in the next few weeks.    12/15/2022: Today - follow up Patient presents with her daughter to review CT chest results which was ordered for workup of her left chest wall pain. Postoperative changes were noted in the left lung and stable. There were no suspicious nodules/masses. She did have old left sided rib fractures. She also had a lesion on her kidney that needs further workup, which she was aware of from a previous scan. She continues to have pain to her left anterior and lateral chest wall. Cardiac and pulmonary workup has been unremarkable. She did not have any response to prednisone when we previously tried this. Pain has been persistent and unchanged for around 6 months now. She did not have any injury/trauma at the time of onset. It does not radiate. She's unsure if it's in her breast or chest wall. Notices it mostly with positional changes. Sometimes the pain takes her breath away but otherwise, her breathing feels like it's at her baseline. No nipple discharge, breast lumps, or changes in skin color. No wheezing, chest congestion, hemoptysis. Mood feels unchanged from previous visit. Working with PCP on this.   No Known Allergies  Immunization History  Administered Date(s) Administered   DT (Pediatric) 05/30/2012   Fluad Quad(high Dose 65+) 03/24/2019, 03/26/2020, 03/31/2022   H1N1 06/26/2008   Influenza Split 05/03/2009, 05/11/2011, 05/24/2012, 04/19/2013, 05/15/2014, 03/24/2019   Influenza Whole 05/03/2009   Influenza, High Dose Seasonal PF 05/03/2017, 04/29/2018, 05/15/2021   Influenza, Seasonal, Injecte, Preservative Fre 05/15/2014   Influenza-Unspecified 05/11/2011, 05/24/2012, 04/19/2013, 05/15/2014, 05/21/2015, 03/24/2019, 03/26/2020   Novel Infuenza-h1n1-09 06/26/2008   PFIZER Comirnaty(Gray Top)Covid-19 Tri-Sucrose Vaccine 08/26/2019, 09/16/2019, 03/26/2020, 12/10/2020   Pfizer Covid-19 Vaccine Bivalent Booster 23yrs & up 05/15/2021   Pneumococcal Conjugate-13 07/04/2015   Pneumococcal  Polysaccharide-23 01/03/2004   Tdap 03/21/2021   Varicella 06/29/2012, 08/04/2012   Zoster Recombinat (Shingrix) 03/21/2021, 11/18/2021    Past Medical History:  Diagnosis Date   Abdominal pain    around naval   Anxiety    Arthritis    Bipolar disorder (HCC)    Breast pain in female    Bruises easily    Cancer (HCC) 2003   left lung   Cervical disc disease    Chronic kidney disease    COPD (chronic obstructive pulmonary disease) (HCC)    O2 as needed.   Depression    DJD (degenerative joint disease)    Dyspnea    uses O2 via [employer] 2-3L PRN   Gum disease    Hernia    Hypoglycemic episode in patient with diabetes mellitus (HCC) 2021   Diet controlled diabetes with occasional hypoglycemia at night   Hypothyroidism    no meds   IDA (iron deficiency anemia) 03/27/2021   in [place]   Insomnia    not currently a problem   Obstructive sleep apnea 08/06/2013   Phlebitis    Pneumonia 03/2022   mild case   Pre-diabetes    diet controlled, no meds   Thyroid disease    Walker as ambulation aid    Wears glasses    Weight increase  Tobacco History: Social History   Tobacco Use  Smoking Status Former   Packs/day: 2.00   Years: 37.00   Additional pack years: 0.00   Total pack years: 74.00   Types: Cigarettes   Quit date: 06/21/2002   Years since quitting: 20.5  Smokeless Tobacco Never   Counseling given: Not Answered   Outpatient Medications Prior to Visit  Medication Sig Dispense Refill   albuterol (PROVENTIL) (2.5 MG/3ML) 0.083% nebulizer solution USE 1 VIAL IN NEBULIZER EVERY 8 HOURS AS NEEDED FOR WHEEZING OR SHORTNESS OF BREATH 375 mL 11   albuterol (VENTOLIN HFA) 108 (90 Base) MCG/ACT inhaler TAKE 2 PUFFS BY MOUTH EVERY 6 HOURS AS NEEDED FOR WHEEZE OR SHORTNESS OF BREATH 8.5 each 12   benzonatate (TESSALON) 200 MG capsule Take 1 capsule (200 mg total) by mouth 3 (three) times daily as needed for cough. 30 capsule 1   cetirizine (ZYRTEC) 10 MG tablet Take 10 mg by  mouth daily.     chlorpheniramine-HYDROcodone (TUSSIONEX) 10-8 MG/5ML Take 5 mLs by mouth every 12 (twelve) hours as needed for cough. 115 mL 0   Cholecalciferol (VITAMIN D3) 125 MCG (5000 UT) TABS Take 10,000 Units by mouth daily.     [employer] ASPIRIN LOW DOSE 81 MG tablet TAKE 1 TABLET (81 MG TOTAL) BY MOUTH DAILY. SWALLOW WHOLE. 90 tablet 3   Cyanocobalamin (B-12) 5000 MCG CAPS Take 5,000 mcg by mouth daily.     diphenhydrAMINE (BENADRYL) 12.5 MG/5ML liquid 25 mg every 6 (six) hours as needed for allergies or itching.     FREESTYLE INSULINX TEST test strip AS DIRECTED TO OBTAIN BLOOD TWICE A DAY AS NEEDED DX CODE E11.69 90 DAYS     furosemide (LASIX) 20 MG tablet Take 20 mg by mouth daily as needed for edema or fluid.     gabapentin (NEURONTIN) 100 MG capsule Take 3 capsules (300 mg total) by mouth 3 (three) times daily. 90 capsule 1   glucose 4 GM chewable tablet Chew 1-3 tablets by mouth as needed for low blood sugar.     ketoconazole (NIZORAL) 2 % cream Apply 1 Application topically daily as needed for irritation. 60 g 2   Lancets (FREESTYLE) lancets      lidocaine (LIDODERM) 5 % Place 1 patch onto the skin daily. Remove & Discard patch within 12 hours or as directed by MD 30 patch 0   Menthol-Methyl Salicylate (SALONPAS PAIN RELIEF PATCH) 3-10 % PTCH Place 1 patch onto the skin daily as needed (pain).     Nebulizers (COMPRESSOR/NEBULIZER) MISC Use as directed 1 each prn   olmesartan-hydrochlorothiazide (BENICAR HCT) 20-12.5 MG tablet TAKE 1 TABLET BY MOUTH EVERY DAY (Patient taking differently: Take 1 tablet by mouth every other day.) 30 tablet 0   Polyethyl Glycol-Propyl Glycol (SYSTANE OP) Place 1 drop into both eyes 3 (three) times daily as needed (dry eyes).     Tiotropium Bromide-Olodaterol (STIOLTO RESPIMAT) 2.5-2.5 MCG/ACT AERS Inhale 2 puffs into the lungs daily. 1 each 12   trolamine salicylate (ASPERCREME) 10 % cream Apply 1 Application topically as needed for muscle pain.      acidophilus (RISAQUAD) CAPS capsule Take 1 capsule by mouth daily. As needed (Patient not taking: Reported on 12/15/2022)     nitroGLYCERIN (NITROSTAT) 0.4 MG SL tablet Place 1 tablet (0.4 mg total) under the tongue every 5 (five) minutes as needed for chest pain. If you require more than two tablets five minutes apart go to the nearest ER via EMS. 30  tablet 0   rosuvastatin (CRESTOR) 10 MG tablet Take 1 tablet (10 mg total) by mouth at bedtime. 90 tablet 0   No facility-administered medications prior to visit.     Review of Systems:   Constitutional: No weight loss or gain, night sweats, fevers, chills, or lassitude. +baseline fatigue  HEENT: No headaches, difficulty swallowing, tooth/dental problems, or sore throat. No sneezing, itching, ear ache, nasal congestion, or post nasal drip CV:  No chest pain, orthopnea, PND, swelling in lower extremities, anasarca, dizziness, palpitations, syncope Resp: +shortness of breath with exertion (baseline); minimal cough. No hemoptysis. No wheezing.  No chest wall deformity GI:  No heartburn, indigestion, abdominal pain, nausea, vomiting, diarrhea, change in bowel habits, loss of appetite, bloody stools.  GU: No dysuria, change in color of urine, urgency or frequency.   Skin: No rash, lesions, ulcerations MSK:  No joint pain or swelling. +left rib/chest wall pain; chronic back pain Neuro: No dizziness or lightheadedness.  Psych: No depression or anxiety. Mood stable.     Physical Exam:  BP 116/66   Pulse 68   Temp 97.9 F (36.6 C) (Oral)   Ht 5' (1.524 m)   Wt 230 lb 12.8 oz (104.7 kg)   SpO2 95%   BMI 45.08 kg/m   GEN: Pleasant, interactive, chronically ill appearing; obese; in no acute distress. HEENT:  Normocephalic and atraumatic. PERRLA. Sclera white. Nasal turbinates pink, moist and patent bilaterally. No rhinorrhea present. Oropharynx pink and moist, without exudate or edema. No lesions, ulcerations, or postnasal drip.  NECK:  Supple  w/ fair ROM. No JVD present. Normal carotid impulses w/o bruits. Thyroid symmetrical with no goiter or nodules palpated. No lymphadenopathy.   CV: RRR, no m/r/g, no peripheral edema. Pulses intact, +2 bilaterally. No cyanosis, pallor or clubbing. PULMONARY:  Unlabored, regular breathing. Clear bilaterally A&P w/o wheezes/rales/rhonchi. No accessory muscle use.  GI: BS present and normoactive. Soft, non-tender to palpation. No organomegaly or masses detected.  MSK: No tenderness, erythema, warmth. Cap refil <2 sec all extrem. No deformities or joint swelling noted.  Neuro: A/Ox3. No focal deficits noted.   Skin: Warm, no lesions or rashe Psych: Normal affect and behavior. Judgement and thought content appropriate.     Lab Results:  CBC    Component Value Date/Time   WBC 6.0 05/28/2022 1423   RBC 5.74 (H) 05/28/2022 1423   HGB 15.6 (H) 05/28/2022 1423   HGB 15.0 01/31/2007 1028   HCT 49.6 (H) 05/28/2022 1423   HCT 44.6 01/31/2007 1028   PLT 187 05/28/2022 1423   PLT 214 01/31/2007 1028   MCV 86.4 05/28/2022 1423   MCV 80.1 (L) 01/31/2007 1028   MCH 27.2 05/28/2022 1423   MCHC 31.5 05/28/2022 1423   RDW 16.0 (H) 05/28/2022 1423   RDW 15.7 (H) 01/31/2007 1028   LYMPHSABS 0.9 10/30/2020 1028   LYMPHSABS 1.0 01/31/2007 1028   MONOABS 0.7 10/30/2020 1028   MONOABS 0.5 01/31/2007 1028   EOSABS 0.1 10/30/2020 1028   EOSABS 0.2 01/31/2007 1028   BASOSABS 0.0 10/30/2020 1028   BASOSABS 0.0 01/31/2007 1028    BMET    Component Value Date/Time   NA 143 11/09/2022 1121   K 5.0 11/09/2022 1121   CL 103 11/09/2022 1121   CO2 26 11/09/2022 1121   GLUCOSE 109 (H) 11/09/2022 1121   GLUCOSE 86 03/31/2022 1513   BUN 31 (H) 11/09/2022 1121   CREATININE 1.15 (H) 11/09/2022 1121   CALCIUM 9.3 11/09/2022 1121  GFRNONAA 55 (L) 05/31/2020 1130   GFRAA >60 09/23/2016 1950    BNP No results found for: "BNP"   Imaging:  CT Chest Wo Contrast  Result Date: 12/15/2022 CLINICAL DATA:   Chest wall pain EXAM: CT CHEST WITHOUT CONTRAST TECHNIQUE: Multidetector CT imaging of the chest was performed following the standard protocol without IV contrast. RADIATION DOSE REDUCTION: This exam was performed according to the departmental dose-optimization program which includes automated exposure control, adjustment of the mA and/or kV according to patient size and/or use of iterative reconstruction technique. COMPARISON:  Chest CTA dated May 14, 2022 FINDINGS: Cardiovascular: Normal heart size. No pericardial effusion. Normal caliber thoracic aorta with mild calcified plaque. Mediastinum/Nodes: Esophagus and thyroid are unremarkable. Enlarged lymph nodes seen in the chest. Lungs/Pleura: Prior left lower lobectomy. Remaining central airways are patent. Severe centrilobular emphysema. No consolidation, pleural effusion or pneumothorax. Upper Abdomen: Postsurgical changes of the stomach. Indeterminate hyperdense exophytic lesion of the mid region of the kidney measuring 5 mm. Partially visualized indeterminate peripherally calcified lesion of the kidney measuring up to 2.1 cm on series 2, image 141. Musculoskeletal: Old left-sided rib fractures no chest wall mass or suspicious bone lesions identified. IMPRESSION: 1. No chest wall mass or suspicious bone lesions identified. 2. Indeterminate hyperdense exophytic lesion of the mid region of the kidney measuring 5 mm. Partially visualized indeterminate peripherally calcified lesion of the kidney measuring up to 2.1 cm. Recommend contrast-enhanced abdominal MRI for further evaluation. 3. Aortic Atherosclerosis (ICD10-I70.0) and Emphysema (ICD10-J43.9). Electronically Signed   By: Allegra Lai M.D.   On: 12/15/2022 11:35          Latest Ref Rng & Units 05/03/2017   12:48 PM  PFT Results  FVC-Pre L 2.70   FVC-Predicted Pre % 144   FVC-Post L 2.70   FVC-Predicted Post % 144   Pre FEV1/FVC % % 56   Post FEV1/FCV % % 59   FEV1-Pre L 1.52    FEV1-Predicted Pre % 105   FEV1-Post L 1.58   DLCO uncorrected ml/min/mmHg 11.19   DLCO UNC% % 59   DLCO corrected ml/min/mmHg 11.19   DLCO COR %Predicted % 59   DLVA Predicted % 60   TLC L 4.64   TLC % Predicted % 104   RV % Predicted % 85     No results found for: "NITRICOXIDE"      Assessment & Plan:   Chest wall pain Unclear etiology. CT imaging without recurrence of cancer and old left sided rib fractures, which appear unchanged compared to previous imaging so unlikely cause of her current symptoms. Given location of pain and concern it may be located in her breast, we will have her repeat diagnostic mammogram and ultrasound to rule out pathologic etiology. I suspect this is MSK in nature given constellation of symptoms, which we discussed. Will refer her to orthopedics for further evaluation/management as long as mammo/US negative.   Patient Instructions  Continue Albuterol inhaler 2 puffs or 3 mL neb every 6 hours as needed for shortness of breath or wheezing. Notify if symptoms persist despite rescue inhaler/neb use.  Continue cetirizine 1 tab daily Continue Stiolto 2 puffs daily Continue supplemental oxygen 2-3 lpm for goal oxygen >88-90%  Diagnostic mammogram ordered given your persistent pain and unexplained cause - someone will contact you to schedule this  Referral to orthopedics in case mammogram is negative   Follow up with your PCP about the area on your kidney and MRI imaging - I have  sent them a message as well.   Follow up with Dr. Maple Hudson as scheduled. If symptoms do not improve or worsen, please contact office for sooner follow up or seek emergency care.   COPD mixed type (HCC) Compensated on current regimen. DOE multifactorial related to COPD, deconditioning, obesity. Action plan in place.   Chronic respiratory failure with hypoxia (HCC) Stable without increased O2 requirement. She was able to maintain saturations on room air at OV. Goal  >88-90%     I spent 35 minutes of dedicated to the care of this patient on the date of this encounter to include pre-visit review of records, face-to-face time with the patient discussing conditions above, post visit ordering of testing, clinical documentation with the electronic health record, making appropriate referrals as documented, and communicating necessary findings to members of the patients care team.  Noemi Chapel, NP 12/16/2022  Pt aware and understands NP's role.

## 2022-12-16 ENCOUNTER — Ambulatory Visit: Payer: Medicare PPO | Admitting: Cardiology

## 2022-12-16 ENCOUNTER — Encounter: Payer: Self-pay | Admitting: Nurse Practitioner

## 2022-12-16 NOTE — Assessment & Plan Note (Signed)
Compensated on current regimen. DOE multifactorial related to COPD, deconditioning, obesity. Action plan in place.

## 2022-12-16 NOTE — Assessment & Plan Note (Signed)
Stable without increased O2 requirement. She was able to maintain saturations on room air at OV. Goal >88-90%

## 2022-12-16 NOTE — Assessment & Plan Note (Addendum)
Unclear etiology. CT imaging without recurrence of cancer and old left sided rib fractures, which appear unchanged compared to previous imaging so unlikely cause of her current symptoms. Given location of pain and concern it may be located in her breast, we will have her repeat diagnostic mammogram and ultrasound to rule out pathologic etiology. I suspect this is MSK in nature given constellation of symptoms, which we discussed. Will refer her to orthopedics for further evaluation/management as long as mammo/US negative.   Patient Instructions  Continue Albuterol inhaler 2 puffs or 3 mL neb every 6 hours as needed for shortness of breath or wheezing. Notify if symptoms persist despite rescue inhaler/neb use.  Continue cetirizine 1 tab daily Continue Stiolto 2 puffs daily Continue supplemental oxygen 2-3 lpm for goal oxygen >88-90%  Diagnostic mammogram ordered given your persistent pain and unexplained cause - someone will contact you to schedule this  Referral to orthopedics in case mammogram is negative   Follow up with your PCP about the area on your kidney and MRI imaging - I have sent them a message as well.   Follow up with Dr. Maple Hudson as scheduled. If symptoms do not improve or worsen, please contact office for sooner follow up or seek emergency care.

## 2022-12-18 ENCOUNTER — Other Ambulatory Visit: Payer: Self-pay | Admitting: Physician Assistant

## 2022-12-18 DIAGNOSIS — N289 Disorder of kidney and ureter, unspecified: Secondary | ICD-10-CM

## 2022-12-21 ENCOUNTER — Encounter: Payer: Self-pay | Admitting: Physician Assistant

## 2022-12-21 ENCOUNTER — Ambulatory Visit (INDEPENDENT_AMBULATORY_CARE_PROVIDER_SITE_OTHER): Payer: Medicare PPO | Admitting: Physician Assistant

## 2022-12-21 VITALS — Ht 60.0 in | Wt 230.0 lb

## 2022-12-21 DIAGNOSIS — R0781 Pleurodynia: Secondary | ICD-10-CM

## 2022-12-21 NOTE — Progress Notes (Signed)
Office Visit Note   Patient: [name]           Date of Birth: August 12, 1945           MRN: [id number] Visit Date: 12/21/2022              Requested by: Noemi Chapel, NP [address] [place],  [place] [address] PCP: Delma Officer, PA  Chief Complaint  Patient presents with   Chest - Pain      HPI: Patient is a pleasant 77 year old woman who is a patient of Dr. Ophelia Charter.  She comes in today with a chief complaint of left lower rib pain.  Denies any injury.  She has a complicated medical history including lung cancer.  She has had lung resection on this left side.  She underwent cervical neck surgery with Dr. Ophelia Charter several months ago.  She said when she woke up she had pain in her right arm that was worse than prior to surgery.  She thinks this was because she was "positioned wrong "she also has had left lower rib pain in 1 particular spot.  She has had extensive workups including it chest CT which showed some old fractures but no evidence of any new metastatic lesion.  She says this is definitely related to activity.  She describes it as sharp.  And is severe enough that it can take her breath away.  She has tried a prednisone taper as well as lidocaine patching without improvement in her symptoms.  She is also tried topical medication which does not seem to improvement she has no radicular findings no weakness.  She is due to start chronic pain management in a week or so  Assessment & Plan: Visit Diagnoses: Chest wall pain  Plan: Certainly difficult to discern.  From an orthopedic standpoint any clinical costochondritis should have been helped with topical medication as well as a prednisone Dosepak.  She does have focally tender pain in that 1 area.  She is strong she has no radicular findings.  She is due to have further workup on her left breast.  I also offered her chest MRI with a pain marker but she like to decline that at this time.  Could be scar tissue  from previous surgery however her last surgery on this part of her body has been quite a while ago.  She will circle around with her primary care doctor she will await the results of her further breast study.  I also encouraged her to follow-up with Dr. Ophelia Charter if she is having worsening pain in the right arm  Follow-Up Instructions: As needed or with Dr. Carolyne Littles Exam  Patient is alert, oriented, no adenopathy, well-dressed, normal affect, normal respiratory effort. Examination patient appears well.  She is good strength no paresthesias no radicular findings.  She is focally tender over the lower left rib.  No evidence of any cellulitis.  No other pain.  She does have pain in the right arm but this has been chronic since after surgery  Imaging: No results found. No images are attached to the encounter.  Labs: Lab Results  Component Value Date   HGBA1C 6.4 (H) 05/31/2020   HGBA1C 6.1 (H) 05/01/2012   ESRSEDRATE 13 10/30/2020   ESRSEDRATE CANCELED 10/30/2020   REPTSTATUS 09/25/2016 FINAL 09/23/2016   CULT MULTIPLE SPECIES PRESENT, SUGGEST RECOLLECTION (A) 09/23/2016     Lab Results  Component Value Date   ALBUMIN 4.4 11/09/2022  ALBUMIN 4.7 05/12/2022   ALBUMIN 4.6 12/12/2021    Lab Results  Component Value Date   MG 2.3 11/21/2021   No results found for: "VD25OH"  No results found for: "PREALBUMIN"    Latest Ref Rng & Units 05/28/2022    2:23 PM 10/30/2020   10:28 AM 10/30/2020   10:21 AM  CBC EXTENDED  WBC 4.0 - 10.5 K/uL 6.0  4.4  CANCELED   RBC 3.87 - 5.11 MIL/uL 5.74  5.52    Hemoglobin 12.0 - 15.0 g/dL 16.1  09.6    HCT 04.5 - 46.0 % 49.6  44.3    Platelets 150 - 400 K/uL 187  197.0    NEUT# 1.4 - 7.7 K/uL  2.6    Lymph# 0.7 - 4.0 K/uL  0.9       Body mass index is 44.92 kg/m.  Orders:  No orders of the defined types were placed in this encounter.  No orders of the defined types were placed in this encounter.    Procedures: No procedures  performed  Clinical Data: No additional findings.  ROS:  All other systems negative, except as noted in the HPI. Review of Systems  Objective: Vital Signs: Ht 5' (1.524 m)   Wt 230 lb (104.3 kg)   BMI 44.92 kg/m   Specialty Comments:  No specialty comments available.  PMFS History: Patient Active Problem List   Diagnosis Date Noted   Rib pain on left side 11/24/2022   Chronic back pain 11/24/2022   Depression 11/24/2022   Costochondritis 10/30/2022   Impingement syndrome of right shoulder 09/22/2022   S/P cervical spinal fusion 06/21/2022   Cervical stenosis of spine 06/05/2022   Sinusitis, acute maxillary 03/31/2022   Left lower lobe pneumonia 03/31/2022   Change in bowel habit 11/24/2021   Epigastric pain 11/24/2021   Internal hemorrhoids 11/24/2021   Personal history of colonic polyps 11/24/2021   Urticaria 11/24/2021   Spinal stenosis of cervical region 11/05/2021   Pincer nail deformity 09/05/2021   History of diabetes mellitus 03/20/2021   Iron deficiency 03/20/2021   Pruritus 01/09/2021   Pain in left leg 12/23/2020   Prediabetes 11/18/2020   Incarcerated incisional hernia s/p lap repair w mesh 06/06/2020 06/06/2020   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   Polyp of colon 10/23/2019   Chronic respiratory failure with hypoxia (HCC) 04/01/2019   Medication management 02/28/2019   Combined forms of age-related cataract of both eyes 12/01/2018   Early stage nonexudative age-related macular degeneration of both eyes 12/01/2018   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   PVD (posterior vitreous detachment), left 11/28/2018   Dysphagia 12/30/2017   Asthma 04/12/2017   BMI 40.0-44.9, adult (HCC) 04/12/2017   Irritable bowel syndrome 04/12/2017   Osteopenia 04/12/2017   Umbilical hernia without obstruction and without gangrene 03/18/2017   Hiatal hernia 01/26/2017   Vitamin D deficiency 06/16/2016   Encounter for preoperative examination for general  surgical procedure 03/06/2016   Hypercholesterolemia 02/25/2016   Hypertensive disorder 02/25/2016   Cough 08/02/2015   Obstructive sleep apnea 08/06/2013   Neoplasm of breast 07/20/2013   COPD with acute bronchitis (HCC) 06/18/2013   Myalgia 06/18/2013   History of laparoscopic adjustable gastric banding, APS. 09/04/2008. 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   Depression    DJD (degenerative joint disease)    Dyspnea    uses O2 via [place] 2-3L PRN   Gum disease    Hernia    Hypoglycemic episode in patient with diabetes mellitus (HCC) 2021   Diet controlled diabetes with occasional hypoglycemia at night   Hypothyroidism    no meds   IDA (iron deficiency anemia) 03/27/2021   in [place]   Insomnia    not currently a problem   Obstructive sleep apnea 08/06/2013   Phlebitis    Pneumonia 03/2022   mild case   Pre-diabetes    diet controlled, no meds   Thyroid disease    Walker as ambulation aid    Wears glasses    Weight increase     Family History  Problem Relation Age of Onset   Stroke Mother    Stroke Father     Past Surgical History:  Procedure Laterality Date   ABDOMINAL HYSTERECTOMY     partial   ANTERIOR CERVICAL DECOMP/DISCECTOMY FUSION N/A 06/05/2022   Procedure: C3-4 ANTERIOR CERVICAL DISCECTOMY FUSION, ALLOGRAFT, PLATE;  Surgeon: Eldred Manges, MD;  Location: [facility] OR;  Service:  Orthopedics;  Laterality: N/A;   CATARACT EXTRACTION  04/2022   FOOT SURGERY     right   HERNIA REPAIR     LAPAROSCOPIC GASTRIC BANDING  08/2008   LAPAROSCOPIC ROUX-EN-Y GASTRIC BYPASS WITH UPPER ENDOSCOPY AND REMOVAL OF LAP BAND  2018   LEFT HEART CATHETERIZATION WITH CORONARY ANGIOGRAM N/A 05/02/2012   Procedure: LEFT HEART CATHETERIZATION WITH CORONARY ANGIOGRAM;  Surgeon: Pamella Pert, MD;  Location: [facility] CATH LAB;  Service: Cardiovascular;  Laterality: N/A;   LUNG LOBECTOMY  2003   left   MASS EXCISION Right 06/06/2020   Procedure: REMOVAL MASS ON RIGHT BUTTOCK;  Surgeon: Karie Soda, MD;  Location: [facility] ORS;  Service: General;  Laterality: Right;   SPINE SURGERY     fusion on the neck   THYROIDECTOMY     partial   TONSILLECTOMY     VENTRAL HERNIA REPAIR N/A 06/06/2020   Procedure: LAPAROSCOPIC INCARCERATED INCISIONAL HERNIA REPAIR WITH MESH AND TAP BLOCK;  Surgeon: Karie Soda, MD;  Location: [facility] ORS;  Service: General;  Laterality: N/A;   Social History   Occupational History   Not on file  Tobacco Use   Smoking status: Former    Packs/day: 2.00    Years: 37.00    Additional pack years: 0.00    Total pack years: 74.00    Types: Cigarettes    Quit date: 06/21/2002    Years since quitting: 20.5   Smokeless tobacco: Never  Vaping Use   Vaping Use: Never used  Substance and Sexual Activity   Alcohol use: Yes    Comment: Occisonal Liquor/WIne   Drug use: No   Sexual activity: Not on file    Comment: Hysterectomy

## 2022-12-22 ENCOUNTER — Encounter: Payer: Self-pay | Admitting: Nurse Practitioner

## 2022-12-23 ENCOUNTER — Telehealth: Payer: Self-pay | Admitting: Nurse Practitioner

## 2022-12-23 DIAGNOSIS — J449 Chronic obstructive pulmonary disease, unspecified: Secondary | ICD-10-CM

## 2022-12-23 NOTE — Telephone Encounter (Signed)
Called and spoke with patient. She stated that she was seen by Florentina Addison on 05/28. Per patient, she was told that Florentina Addison would send in a refill on her Tussionex cough syrup. I looked at the office notes and did not see this mentioned.   She [facility] [address]   Katie, can you please advise? Thanks!

## 2022-12-23 NOTE — Telephone Encounter (Signed)
Patient checking on RX for cough syrup. [facility]. Patient phone number is [phone].

## 2022-12-25 DIAGNOSIS — M170 Bilateral primary osteoarthritis of knee: Secondary | ICD-10-CM | POA: Diagnosis not present

## 2022-12-25 DIAGNOSIS — M25562 Pain in left knee: Secondary | ICD-10-CM | POA: Diagnosis not present

## 2022-12-25 DIAGNOSIS — R262 Difficulty in walking, not elsewhere classified: Secondary | ICD-10-CM | POA: Diagnosis not present

## 2022-12-25 DIAGNOSIS — M25561 Pain in right knee: Secondary | ICD-10-CM | POA: Diagnosis not present

## 2022-12-28 MED ORDER — HYDROCOD POLI-CHLORPHE POLI ER 10-8 MG/5ML PO SUER
5.0000 mL | Freq: Two times a day (BID) | ORAL | 0 refills | Status: DC | PRN
Start: 2022-12-28 — End: 2023-05-28

## 2022-12-28 NOTE — Telephone Encounter (Signed)
PT states this is her 3rd req. Resent to Ms. Cobb and Triage. She said she mentioned it to Ms. Cobb at her last appt. Her # is [phone]. Pharm is in original notes.

## 2022-12-28 NOTE — Telephone Encounter (Signed)
It was sent last week but doesn't look like it was filled. I'll resend. Should be at her pharmacy today. Thanks!

## 2022-12-29 NOTE — Telephone Encounter (Signed)
Called and spoke w/ pt she verbalized that she is going to p/u prescription now. NFN att.

## 2022-12-30 DIAGNOSIS — R262 Difficulty in walking, not elsewhere classified: Secondary | ICD-10-CM | POA: Diagnosis not present

## 2022-12-30 DIAGNOSIS — M25561 Pain in right knee: Secondary | ICD-10-CM | POA: Diagnosis not present

## 2022-12-30 DIAGNOSIS — M170 Bilateral primary osteoarthritis of knee: Secondary | ICD-10-CM | POA: Diagnosis not present

## 2022-12-30 DIAGNOSIS — M25562 Pain in left knee: Secondary | ICD-10-CM | POA: Diagnosis not present

## 2023-01-02 DIAGNOSIS — J449 Chronic obstructive pulmonary disease, unspecified: Secondary | ICD-10-CM | POA: Diagnosis not present

## 2023-01-02 DIAGNOSIS — J9611 Chronic respiratory failure with hypoxia: Secondary | ICD-10-CM | POA: Diagnosis not present

## 2023-01-04 DIAGNOSIS — N644 Mastodynia: Secondary | ICD-10-CM | POA: Diagnosis not present

## 2023-01-04 DIAGNOSIS — Z79899 Other long term (current) drug therapy: Secondary | ICD-10-CM | POA: Diagnosis not present

## 2023-01-04 DIAGNOSIS — G894 Chronic pain syndrome: Secondary | ICD-10-CM | POA: Diagnosis not present

## 2023-01-04 DIAGNOSIS — M79643 Pain in unspecified hand: Secondary | ICD-10-CM | POA: Diagnosis not present

## 2023-01-04 DIAGNOSIS — Z79891 Long term (current) use of opiate analgesic: Secondary | ICD-10-CM | POA: Diagnosis not present

## 2023-01-04 DIAGNOSIS — M25569 Pain in unspecified knee: Secondary | ICD-10-CM | POA: Diagnosis not present

## 2023-01-13 ENCOUNTER — Ambulatory Visit
Admission: RE | Admit: 2023-01-13 | Discharge: 2023-01-13 | Disposition: A | Discharge status: Home / Self Care | Payer: Medicare PPO | Source: Ambulatory Visit | Attending: Nurse Practitioner | Admitting: Nurse Practitioner

## 2023-01-13 DIAGNOSIS — N644 Mastodynia: Secondary | ICD-10-CM

## 2023-01-13 DIAGNOSIS — R0789 Other chest pain: Secondary | ICD-10-CM

## 2023-01-14 DIAGNOSIS — Z961 Presence of intraocular lens: Secondary | ICD-10-CM | POA: Diagnosis not present

## 2023-01-14 DIAGNOSIS — H40013 Open angle with borderline findings, low risk, bilateral: Secondary | ICD-10-CM | POA: Diagnosis not present

## 2023-01-14 NOTE — Progress Notes (Signed)
Mammogram and [place] were normal. Please advise her to follow up with her PCP to discuss possible MRI of the chest, if the pain has persisted. Thanks!!

## 2023-01-15 DIAGNOSIS — M25561 Pain in right knee: Secondary | ICD-10-CM | POA: Diagnosis not present

## 2023-01-15 DIAGNOSIS — M170 Bilateral primary osteoarthritis of knee: Secondary | ICD-10-CM | POA: Diagnosis not present

## 2023-01-15 DIAGNOSIS — M25562 Pain in left knee: Secondary | ICD-10-CM | POA: Diagnosis not present

## 2023-01-15 DIAGNOSIS — R262 Difficulty in walking, not elsewhere classified: Secondary | ICD-10-CM | POA: Diagnosis not present

## 2023-01-22 ENCOUNTER — Encounter: Payer: Self-pay | Admitting: Physician Assistant

## 2023-01-25 ENCOUNTER — Ambulatory Visit: Payer: Medicare PPO | Admitting: Podiatry

## 2023-01-25 ENCOUNTER — Ambulatory Visit
Admission: RE | Admit: 2023-01-25 | Discharge: 2023-01-25 | Disposition: A | Discharge status: Home / Self Care | Payer: Medicare PPO | Source: Ambulatory Visit | Attending: Physician Assistant | Admitting: Physician Assistant

## 2023-01-25 ENCOUNTER — Other Ambulatory Visit: Payer: Self-pay | Admitting: Physician Assistant

## 2023-01-25 DIAGNOSIS — N2889 Other specified disorders of kidney and ureter: Secondary | ICD-10-CM | POA: Diagnosis not present

## 2023-01-25 DIAGNOSIS — N289 Disorder of kidney and ureter, unspecified: Secondary | ICD-10-CM

## 2023-01-25 MED ORDER — GADOPICLENOL 0.5 MMOL/ML IV SOLN
10.0000 mL | Freq: Once | INTRAVENOUS | Status: AC | PRN
  Administered 2023-01-25: 10 mL via INTRAVENOUS

## 2023-01-27 DIAGNOSIS — R262 Difficulty in walking, not elsewhere classified: Secondary | ICD-10-CM | POA: Diagnosis not present

## 2023-01-27 DIAGNOSIS — M25562 Pain in left knee: Secondary | ICD-10-CM | POA: Diagnosis not present

## 2023-01-27 DIAGNOSIS — M170 Bilateral primary osteoarthritis of knee: Secondary | ICD-10-CM | POA: Diagnosis not present

## 2023-01-27 DIAGNOSIS — M25561 Pain in right knee: Secondary | ICD-10-CM | POA: Diagnosis not present

## 2023-02-01 ENCOUNTER — Ambulatory Visit: Payer: Medicare PPO | Admitting: Podiatry

## 2023-02-01 ENCOUNTER — Encounter: Payer: Self-pay | Admitting: Podiatry

## 2023-02-01 DIAGNOSIS — E119 Type 2 diabetes mellitus without complications: Secondary | ICD-10-CM

## 2023-02-01 DIAGNOSIS — J9611 Chronic respiratory failure with hypoxia: Secondary | ICD-10-CM | POA: Diagnosis not present

## 2023-02-01 DIAGNOSIS — M79676 Pain in unspecified toe(s): Secondary | ICD-10-CM | POA: Diagnosis not present

## 2023-02-01 DIAGNOSIS — E1151 Type 2 diabetes mellitus with diabetic peripheral angiopathy without gangrene: Secondary | ICD-10-CM | POA: Diagnosis not present

## 2023-02-01 DIAGNOSIS — J449 Chronic obstructive pulmonary disease, unspecified: Secondary | ICD-10-CM | POA: Diagnosis not present

## 2023-02-01 DIAGNOSIS — Z79899 Other long term (current) drug therapy: Secondary | ICD-10-CM | POA: Diagnosis not present

## 2023-02-01 DIAGNOSIS — B351 Tinea unguium: Secondary | ICD-10-CM | POA: Diagnosis not present

## 2023-02-01 DIAGNOSIS — G894 Chronic pain syndrome: Secondary | ICD-10-CM | POA: Diagnosis not present

## 2023-02-01 DIAGNOSIS — Z79891 Long term (current) use of opiate analgesic: Secondary | ICD-10-CM | POA: Diagnosis not present

## 2023-02-01 DIAGNOSIS — M5442 Lumbago with sciatica, left side: Secondary | ICD-10-CM | POA: Diagnosis not present

## 2023-02-01 DIAGNOSIS — M79603 Pain in arm, unspecified: Secondary | ICD-10-CM | POA: Diagnosis not present

## 2023-02-01 DIAGNOSIS — N644 Mastodynia: Secondary | ICD-10-CM | POA: Diagnosis not present

## 2023-02-01 DIAGNOSIS — M25569 Pain in unspecified knee: Secondary | ICD-10-CM | POA: Diagnosis not present

## 2023-02-01 DIAGNOSIS — M5441 Lumbago with sciatica, right side: Secondary | ICD-10-CM | POA: Diagnosis not present

## 2023-02-01 NOTE — Progress Notes (Signed)
  Subjective:  Patient ID: [name], female    DOB: May 15, 1946,   MRN: [id number]  Chief Complaint  Patient presents with   Nail Problem    Nail trim  She stated that the back of her feet are really dry and sometimes they burn when she washes them     77 y.o. female presents for diabetic routine foot care. Relates thick elongated and painful toenails that she cannot trim herself. Relates she is diabetic and last A1c was 6.1 on 12.8.22 .  Denies any other pedal complaints. Denies n/v/f/c.   PCP Renford Dills MD   Past Medical History:  Diagnosis Date   Abdominal pain    around naval   Anxiety    Arthritis    Bipolar disorder [facility])    Breast pain in female    Bruises easily    Cancer (HCC) 2003   left lung   Cervical disc disease    Chronic kidney disease    COPD (chronic obstructive pulmonary disease) (HCC)    O2 as needed.   Depression    DJD (degenerative joint disease)    Dyspnea    uses O2 via [place] 2-3L PRN   Gum disease    Hernia    Hypoglycemic episode in patient with diabetes mellitus (HCC) 2021   Diet controlled diabetes with occasional hypoglycemia at night   Hypothyroidism    no meds   IDA (iron deficiency anemia) 03/27/2021   in [place]   Insomnia    not currently a problem   Obstructive sleep apnea 08/06/2013   Phlebitis    Pneumonia 03/2022   mild case   Pre-diabetes    diet controlled, no meds   Thyroid disease    Walker as ambulation aid    Wears glasses    Weight increase     Objective:  Physical Exam: Vascular: DP/PT pulses 2/4 bilateral. CFT <3 seconds. Normal hair growth on digits. No edema.  Skin. No lacerations or abrasions bilateral feet. Nails 1-5 are thickened discolored and elongated with subungual debris.   Musculoskeletal: MMT 5/5 bilateral lower extremities in DF, PF, Inversion and Eversion. Deceased ROM in DF of ankle joint.  Neurological: Sensation intact to light touch.   Assessment:   1. Pain due to onychomycosis of  toenail   2. Type 2 diabetes mellitus with peripheral vascular disease (HCC)         Plan:  Patient was evaluated and treated and all questions answered. -Discussed and educated patient on diabetic foot care, especially with  regards to the vascular, neurological and musculoskeletal systems.  -Stressed the importance of good glycemic control and the detriment of not  controlling glucose levels in relation to the foot. -Discussed supportive shoes at all times and checking feet regularly.  -Mechanically debrided all nails 1-5 bilateral using sterile nail nipper and filed with dremel without incident  -Answered all patient questions -Patient to return  in 3 months for at risk foot care -Patient advised to call the office if any problems or questions arise in the meantime.   Louann Sjogren, DPM

## 2023-02-05 DIAGNOSIS — M25562 Pain in left knee: Secondary | ICD-10-CM | POA: Diagnosis not present

## 2023-02-05 DIAGNOSIS — R262 Difficulty in walking, not elsewhere classified: Secondary | ICD-10-CM | POA: Diagnosis not present

## 2023-02-05 DIAGNOSIS — M170 Bilateral primary osteoarthritis of knee: Secondary | ICD-10-CM | POA: Diagnosis not present

## 2023-02-05 DIAGNOSIS — M25561 Pain in right knee: Secondary | ICD-10-CM | POA: Diagnosis not present

## 2023-02-12 ENCOUNTER — Other Ambulatory Visit: Payer: Medicare PPO

## 2023-02-17 DIAGNOSIS — M25562 Pain in left knee: Secondary | ICD-10-CM | POA: Diagnosis not present

## 2023-02-17 DIAGNOSIS — M25561 Pain in right knee: Secondary | ICD-10-CM | POA: Diagnosis not present

## 2023-02-17 DIAGNOSIS — M170 Bilateral primary osteoarthritis of knee: Secondary | ICD-10-CM | POA: Diagnosis not present

## 2023-02-17 DIAGNOSIS — R262 Difficulty in walking, not elsewhere classified: Secondary | ICD-10-CM | POA: Diagnosis not present

## 2023-02-22 ENCOUNTER — Telehealth: Payer: Self-pay | Admitting: Nurse Practitioner

## 2023-02-22 DIAGNOSIS — J209 Acute bronchitis, unspecified: Secondary | ICD-10-CM

## 2023-02-22 NOTE — Telephone Encounter (Signed)
Pt needs a refill for albuterol (PROVENTIL) (2.5 MG/3ML) 0.083% nebulizer solution   [facility] [address]

## 2023-02-23 MED ORDER — ALBUTEROL SULFATE (2.5 MG/3ML) 0.083% IN NEBU
2.5000 mg | INHALATION_SOLUTION | Freq: Three times a day (TID) | RESPIRATORY_TRACT | 0 refills | Status: AC | PRN
Start: 2023-02-23 — End: ?

## 2023-03-01 DIAGNOSIS — M5441 Lumbago with sciatica, right side: Secondary | ICD-10-CM | POA: Diagnosis not present

## 2023-03-01 DIAGNOSIS — M5442 Lumbago with sciatica, left side: Secondary | ICD-10-CM | POA: Diagnosis not present

## 2023-03-01 DIAGNOSIS — N644 Mastodynia: Secondary | ICD-10-CM | POA: Diagnosis not present

## 2023-03-01 DIAGNOSIS — M2550 Pain in unspecified joint: Secondary | ICD-10-CM | POA: Diagnosis not present

## 2023-03-01 DIAGNOSIS — Z79891 Long term (current) use of opiate analgesic: Secondary | ICD-10-CM | POA: Diagnosis not present

## 2023-03-01 DIAGNOSIS — M25569 Pain in unspecified knee: Secondary | ICD-10-CM | POA: Diagnosis not present

## 2023-03-01 DIAGNOSIS — M79603 Pain in arm, unspecified: Secondary | ICD-10-CM | POA: Diagnosis not present

## 2023-03-01 DIAGNOSIS — G894 Chronic pain syndrome: Secondary | ICD-10-CM | POA: Diagnosis not present

## 2023-03-03 ENCOUNTER — Telehealth: Payer: Self-pay | Admitting: Nurse Practitioner

## 2023-03-03 DIAGNOSIS — J209 Acute bronchitis, unspecified: Secondary | ICD-10-CM

## 2023-03-03 NOTE — Telephone Encounter (Signed)
Patient states having symptoms of sore throat and cough. [facility] [address] Needs letter to state only can have first floor apartment. Needs refill for Albuterol inhaler. Also needs Nebulizer supplies. Patient uses [facility] for Nebulizer machine. Patient phone number is [phone].

## 2023-03-04 ENCOUNTER — Encounter: Payer: Self-pay | Admitting: Internal Medicine

## 2023-03-04 DIAGNOSIS — J449 Chronic obstructive pulmonary disease, unspecified: Secondary | ICD-10-CM | POA: Diagnosis not present

## 2023-03-04 DIAGNOSIS — J9611 Chronic respiratory failure with hypoxia: Secondary | ICD-10-CM | POA: Diagnosis not present

## 2023-03-04 MED ORDER — AZITHROMYCIN 250 MG PO TABS
ORAL_TABLET | ORAL | 0 refills | Status: DC
Start: 2023-03-04 — End: 2023-05-19

## 2023-03-04 MED ORDER — ALBUTEROL SULFATE HFA 108 (90 BASE) MCG/ACT IN AERS: 2.0000 | INHALATION_SPRAY | RESPIRATORY_TRACT | 5 refills | Status: DC | PRN

## 2023-03-04 NOTE — Telephone Encounter (Signed)
Spoke with the pt  She is c/o cough with green sputum and throat irritation x 2 days  She denies any wheezing, increased SOB, fevers, aches  Please advise, thanks!   No Known Allergies

## 2023-03-04 NOTE — Telephone Encounter (Signed)
Albuterol rescue inhaler refill and script for ZPAK antibiotic sent to her drug store.  Letter written about needing 1st floor apartment- is on [place]

## 2023-03-08 ENCOUNTER — Telehealth: Payer: Self-pay | Admitting: Internal Medicine

## 2023-03-08 NOTE — Telephone Encounter (Signed)
Patient is calling requesting a letter that states that she needs to live on the fist floor due to health conditions. It can be faxed to [phone] [email or web address] Eads email address : [email or web address]

## 2023-03-09 NOTE — Telephone Encounter (Signed)
Letter has been faxed over to patients apartment. Closing encounter. NFN

## 2023-03-09 NOTE — Telephone Encounter (Signed)
Pt aware see other phone note dated 03/08/23

## 2023-03-10 ENCOUNTER — Encounter: Payer: Self-pay | Admitting: Cardiology

## 2023-03-11 ENCOUNTER — Ambulatory Visit: Payer: Medicare PPO | Admitting: Cardiology

## 2023-03-12 ENCOUNTER — Ambulatory Visit: Payer: Medicare PPO | Admitting: Cardiology

## 2023-03-18 DIAGNOSIS — D4101 Neoplasm of uncertain behavior of right kidney: Secondary | ICD-10-CM | POA: Diagnosis not present

## 2023-03-18 DIAGNOSIS — D4102 Neoplasm of uncertain behavior of left kidney: Secondary | ICD-10-CM | POA: Diagnosis not present

## 2023-04-04 DIAGNOSIS — J9611 Chronic respiratory failure with hypoxia: Secondary | ICD-10-CM | POA: Diagnosis not present

## 2023-04-04 DIAGNOSIS — J449 Chronic obstructive pulmonary disease, unspecified: Secondary | ICD-10-CM | POA: Diagnosis not present

## 2023-04-08 NOTE — Telephone Encounter (Signed)
This encounter was created in error - please disregard.

## 2023-04-23 DIAGNOSIS — K1321 Leukoplakia of oral mucosa, including tongue: Secondary | ICD-10-CM | POA: Diagnosis not present

## 2023-04-27 DIAGNOSIS — Z6841 Body Mass Index (BMI) 40.0 and over, adult: Secondary | ICD-10-CM | POA: Diagnosis not present

## 2023-04-27 DIAGNOSIS — J449 Chronic obstructive pulmonary disease, unspecified: Secondary | ICD-10-CM | POA: Diagnosis not present

## 2023-04-27 DIAGNOSIS — Z9989 Dependence on other enabling machines and devices: Secondary | ICD-10-CM | POA: Diagnosis not present

## 2023-04-27 DIAGNOSIS — M549 Dorsalgia, unspecified: Secondary | ICD-10-CM | POA: Diagnosis not present

## 2023-04-27 DIAGNOSIS — R609 Edema, unspecified: Secondary | ICD-10-CM | POA: Diagnosis not present

## 2023-04-27 DIAGNOSIS — M25561 Pain in right knee: Secondary | ICD-10-CM | POA: Diagnosis not present

## 2023-05-04 DIAGNOSIS — J449 Chronic obstructive pulmonary disease, unspecified: Secondary | ICD-10-CM | POA: Diagnosis not present

## 2023-05-04 DIAGNOSIS — J9611 Chronic respiratory failure with hypoxia: Secondary | ICD-10-CM | POA: Diagnosis not present

## 2023-05-05 ENCOUNTER — Ambulatory Visit (INDEPENDENT_AMBULATORY_CARE_PROVIDER_SITE_OTHER): Payer: Medicare PPO | Admitting: Podiatry

## 2023-05-05 ENCOUNTER — Encounter: Payer: Self-pay | Admitting: Podiatry

## 2023-05-05 DIAGNOSIS — B351 Tinea unguium: Secondary | ICD-10-CM

## 2023-05-05 DIAGNOSIS — E1151 Type 2 diabetes mellitus with diabetic peripheral angiopathy without gangrene: Secondary | ICD-10-CM

## 2023-05-05 DIAGNOSIS — M79676 Pain in unspecified toe(s): Secondary | ICD-10-CM

## 2023-05-05 NOTE — Progress Notes (Signed)
  Subjective:  Patient ID: [name], female    DOB: 05/09/1946,   MRN: [id number]  No chief complaint on file.   77 y.o. female presents for diabetic routine foot care. Relates thick elongated and painful toenails that she cannot trim herself. Relates she is diabetic and last A1c was 6.1 on 12.8.22 .  Denies any other pedal complaints. Denies n/v/f/c.   PCP Renford Dills MD   Past Medical History:  Diagnosis Date   Abdominal pain    around naval   Anxiety    Arthritis    Bipolar disorder [facility])    Breast pain in female    Bruises easily    Cancer (HCC) 2003   left lung   Cervical disc disease    Chronic kidney disease    COPD (chronic obstructive pulmonary disease) (HCC)    O2 as needed.   Depression    DJD (degenerative joint disease)    Dyspnea    uses O2 via [place] 2-3L PRN   Gum disease    Hernia    Hypoglycemic episode in patient with diabetes mellitus (HCC) 2021   Diet controlled diabetes with occasional hypoglycemia at night   Hypothyroidism    no meds   IDA (iron deficiency anemia) 03/27/2021   in [place]   Insomnia    not currently a problem   Obstructive sleep apnea 08/06/2013   Phlebitis    Pneumonia 03/2022   mild case   Pre-diabetes    diet controlled, no meds   Thyroid disease    Walker as ambulation aid    Wears glasses    Weight increase     Objective:  Physical Exam: Vascular: DP/PT pulses 2/4 bilateral. CFT <3 seconds. Normal hair growth on digits. No edema.  Skin. No lacerations or abrasions bilateral feet. Nails 1-5 are thickened discolored and elongated with subungual debris.  Musculoskeletal: MMT 5/5 bilateral lower extremities in DF, PF, Inversion and Eversion. Deceased ROM in DF of ankle joint.  Neurological: Sensation intact to light touch.   Assessment:   1. Pain due to onychomycosis of toenail   2. Type 2 diabetes mellitus with peripheral vascular disease (HCC)        Plan:  Patient was evaluated and treated and all  questions answered. -Discussed and educated patient on diabetic foot care, especially with  regards to the vascular, neurological and musculoskeletal systems.  -Stressed the importance of good glycemic control and the detriment of not  controlling glucose levels in relation to the foot. -Discussed supportive shoes at all times and checking feet regularly.  -Mechanically debrided all nails 1-5 bilateral using sterile nail nipper and filed with dremel without incident  -Answered all patient questions -Patient to return  in 3 months for at risk foot care -Patient advised to call the office if any problems or questions arise in the meantime.   Louann Sjogren, DPM

## 2023-05-06 DIAGNOSIS — R350 Frequency of micturition: Secondary | ICD-10-CM | POA: Diagnosis not present

## 2023-05-18 ENCOUNTER — Ambulatory Visit: Payer: Medicare PPO | Admitting: Orthopaedic Surgery

## 2023-05-19 ENCOUNTER — Ambulatory Visit
Admission: EM | Admit: 2023-05-19 | Discharge: 2023-05-19 | Disposition: A | Discharge status: Home / Self Care | Payer: Medicare PPO | Attending: Internal Medicine | Admitting: Internal Medicine

## 2023-05-19 ENCOUNTER — Other Ambulatory Visit: Payer: Self-pay | Admitting: Urgent Care

## 2023-05-19 ENCOUNTER — Ambulatory Visit: Payer: Medicare PPO

## 2023-05-19 DIAGNOSIS — Z1152 Encounter for screening for COVID-19: Secondary | ICD-10-CM | POA: Diagnosis not present

## 2023-05-19 DIAGNOSIS — R918 Other nonspecific abnormal finding of lung field: Secondary | ICD-10-CM | POA: Diagnosis not present

## 2023-05-19 DIAGNOSIS — Z87891 Personal history of nicotine dependence: Secondary | ICD-10-CM | POA: Diagnosis not present

## 2023-05-19 DIAGNOSIS — R110 Nausea: Secondary | ICD-10-CM | POA: Diagnosis present

## 2023-05-19 DIAGNOSIS — R059 Cough, unspecified: Secondary | ICD-10-CM | POA: Diagnosis not present

## 2023-05-19 DIAGNOSIS — R0902 Hypoxemia: Secondary | ICD-10-CM | POA: Insufficient documentation

## 2023-05-19 DIAGNOSIS — R9389 Abnormal findings on diagnostic imaging of other specified body structures: Secondary | ICD-10-CM | POA: Diagnosis not present

## 2023-05-19 DIAGNOSIS — J189 Pneumonia, unspecified organism: Secondary | ICD-10-CM | POA: Insufficient documentation

## 2023-05-19 DIAGNOSIS — J168 Pneumonia due to other specified infectious organisms: Secondary | ICD-10-CM | POA: Diagnosis not present

## 2023-05-19 DIAGNOSIS — J441 Chronic obstructive pulmonary disease with (acute) exacerbation: Secondary | ICD-10-CM | POA: Diagnosis not present

## 2023-05-19 DIAGNOSIS — B349 Viral infection, unspecified: Secondary | ICD-10-CM | POA: Diagnosis not present

## 2023-05-19 MED ORDER — AZITHROMYCIN 250 MG PO TABS: ORAL_TABLET | ORAL | 0 refills | Status: DC

## 2023-05-19 MED ORDER — AMOXICILLIN-POT CLAVULANATE 875-125 MG PO TABS: 1.0000 | ORAL_TABLET | Freq: Two times a day (BID) | ORAL | 0 refills | Status: DC

## 2023-05-19 MED ORDER — PREDNISONE 20 MG PO TABS: ORAL_TABLET | ORAL | 0 refills | Status: DC

## 2023-05-19 MED ORDER — FLUCONAZOLE 150 MG PO TABS: 150.0000 mg | ORAL_TABLET | ORAL | 0 refills | Status: DC

## 2023-05-19 MED ORDER — PROMETHAZINE-DM 6.25-15 MG/5ML PO SYRP: 5.0000 mL | ORAL_SOLUTION | Freq: Three times a day (TID) | ORAL | 0 refills | Status: DC | PRN

## 2023-05-19 NOTE — Discharge Instructions (Signed)
We will notify you of your test results as they arrive and may take between about 24 hours.  I encourage you to sign up for MyChart if you have not already done so as this can be the easiest way for [place] to communicate results to you online or through a phone app.  Generally, we only contact you if it is a positive test result.  In the meantime, if you develop worsening symptoms including fever, chest pain, shortness of breath despite our current treatment plan then please report to the emergency room as this may be a sign of worsening status from possible viral infection.  Otherwise, we will manage this as a viral syndrome. For sore throat or cough try using a honey-based tea. Use 3 teaspoons of honey with juice squeezed from half lemon. Place shaved pieces of ginger into 1/2-1 cup of water and warm over stove top. Then mix the ingredients and repeat every 4 hours as needed. Please take Tylenol 650mg  every 6 hours for aches and pains, fevers. Hydrate very well with at least 2 liters of water. Eat light meals such as soups to replenish electrolytes and soft fruits, veggies. Keep taking Zyrtec for postnasal drainage, sinus congestion.  You can take this together with prednisone.  Use albuterol as needed.  Use the cough medications as needed.

## 2023-05-19 NOTE — ED Triage Notes (Signed)
Pt reports cough, nausea, low appetite, fever 102.0 F and chills x 2 days. Tylenol and Motrin gives some relief.

## 2023-05-19 NOTE — ED Provider Notes (Addendum)
[facility]  Note:  This document was prepared using Conservation officer, historic buildings and may include unintentional dictation errors.  MRN: [id number] DOB: 06-14-1946  Subjective:   [name] is a 77 y.o. female presenting for 2-day history of acute onset nausea, decreased appetite, fever, coughing, chills.  Has used Tylenol and Motrin with some relief.  Takes Zyrtec daily for allergies.  Does not need refills on her inhalers.  She does have a history of COPD, uses 2-3 L of oxygen at home as needed.  Has not started using it currently.  Would like a COVID test.  She does use a narcotic cough medication as needed as well.  No longer has a gastric band.  No current facility-administered medications for this encounter.  Current Outpatient Medications:    acidophilus (RISAQUAD) CAPS capsule, Take 1 capsule by mouth daily. As needed (Patient not taking: Reported on 12/15/2022), Disp: , Rfl:    albuterol (PROVENTIL) (2.5 MG/3ML) 0.083% nebulizer solution, Take 3 mLs (2.5 mg total) by nebulization every 8 (eight) hours as needed for wheezing or shortness of breath. USE 1 VIAL IN NEBULIZER EVERY 8 HOURS AS NEEDED FOR WHEEZING OR SHORTNESS OF BREATH, Disp: 375 mL, Rfl: 0   albuterol (VENTOLIN HFA) 108 (90 Base) MCG/ACT inhaler, Inhale 2 puffs into the lungs every 4 (four) hours as needed for wheezing or shortness of breath., Disp: 8.5 each, Rfl: 5   azithromycin (ZITHROMAX) 250 MG tablet, 2 today then one daily, Disp: 6 tablet, Rfl: 0   benzonatate (TESSALON) 200 MG capsule, Take 1 capsule (200 mg total) by mouth 3 (three) times daily as needed for cough., Disp: 30 capsule, Rfl: 1   cetirizine (ZYRTEC) 10 MG tablet, Take 10 mg by mouth daily., Disp: , Rfl:    chlorpheniramine-HYDROcodone (TUSSIONEX) 10-8 MG/5ML, Take 5 mLs by mouth every 12 (twelve) hours as needed for cough., Disp: 115 mL, Rfl: 0   Cholecalciferol (VITAMIN D3) 125 MCG (5000 UT) TABS, Take 10,000  Units by mouth daily., Disp: , Rfl:    CVS ASPIRIN LOW DOSE 81 MG tablet, TAKE 1 TABLET (81 MG TOTAL) BY MOUTH DAILY. SWALLOW WHOLE., Disp: 90 tablet, Rfl: 3   Cyanocobalamin (B-12) 5000 MCG CAPS, Take 5,000 mcg by mouth daily., Disp: , Rfl:    diphenhydrAMINE (BENADRYL) 12.5 MG/5ML liquid, 25 mg every 6 (six) hours as needed for allergies or itching., Disp: , Rfl:    FREESTYLE INSULINX TEST test strip, AS DIRECTED TO OBTAIN BLOOD TWICE A DAY AS NEEDED DX CODE E11.69 90 DAYS, Disp: , Rfl:    furosemide (LASIX) 20 MG tablet, Take 20 mg by mouth daily as needed for edema or fluid., Disp: , Rfl:    gabapentin (NEURONTIN) 100 MG capsule, Take 3 capsules (300 mg total) by mouth 3 (three) times daily., Disp: 90 capsule, Rfl: 1   glucose 4 GM chewable tablet, Chew 1-3 tablets by mouth as needed for low blood sugar., Disp: , Rfl:    ketoconazole (NIZORAL) 2 % cream, Apply 1 Application topically daily as needed for irritation., Disp: 60 g, Rfl: 2   Lancets (FREESTYLE) lancets, , Disp: , Rfl:    lidocaine (LIDODERM) 5 %, Place 1 patch onto the skin daily. Remove & Discard patch within 12 hours or as directed by MD, Disp: 30 patch, Rfl: 0   Menthol-Methyl Salicylate (SALONPAS PAIN RELIEF PATCH) 3-10 % PTCH, Place 1 patch onto the skin daily as needed (pain)., Disp: , Rfl:  Nebulizers (COMPRESSOR/NEBULIZER) MISC, Use as directed, Disp: 1 each, Rfl: prn   nitroGLYCERIN (NITROSTAT) 0.4 MG SL tablet, Place 1 tablet (0.4 mg total) under the tongue every 5 (five) minutes as needed for chest pain. If you require more than two tablets five minutes apart go to the nearest ER via EMS., Disp: 30 tablet, Rfl: 0   olmesartan-hydrochlorothiazide (BENICAR HCT) 20-12.5 MG tablet, TAKE 1 TABLET BY MOUTH EVERY DAY (Patient taking differently: Take 1 tablet by mouth every other day.), Disp: 30 tablet, Rfl: 0   Polyethyl Glycol-Propyl Glycol (SYSTANE OP), Place 1 drop into both eyes 3 (three) times daily as needed (dry eyes).,  Disp: , Rfl:    rosuvastatin (CRESTOR) 10 MG tablet, Take 1 tablet (10 mg total) by mouth at bedtime., Disp: 90 tablet, Rfl: 0   Tiotropium Bromide-Olodaterol (STIOLTO RESPIMAT) 2.5-2.5 MCG/ACT AERS, Inhale 2 puffs into the lungs daily., Disp: 1 each, Rfl: 12   trolamine salicylate (ASPERCREME) 10 % cream, Apply 1 Application topically as needed for muscle pain., Disp: , Rfl:    No Known Allergies  Past Medical History:  Diagnosis Date   Abdominal pain    around naval   Anxiety    Arthritis    Bipolar disorder (HCC)    Breast pain in female    Bruises easily    Cancer (HCC) 2003   left lung   Cervical disc disease    Chronic kidney disease    COPD (chronic obstructive pulmonary disease) (HCC)    O2 as needed.   Depression    DJD (degenerative joint disease)    Dyspnea    uses O2 via Gisela 2-3L PRN   Gum disease    Hernia    Hypoglycemic episode in patient with diabetes mellitus (HCC) 2021   Diet controlled diabetes with occasional hypoglycemia at night   Hypothyroidism    no meds   IDA (iron deficiency anemia) 03/27/2021   in [facility]   Insomnia    not currently a problem   Obstructive sleep apnea 08/06/2013   Phlebitis    Pneumonia 03/2022   mild case   Pre-diabetes    diet controlled, no meds   Thyroid disease    Walker as ambulation aid    Wears glasses    Weight increase      Past Surgical History:  Procedure Laterality Date   ABDOMINAL HYSTERECTOMY     partial   ANTERIOR CERVICAL DECOMP/DISCECTOMY FUSION N/A 06/05/2022   Procedure: C3-4 ANTERIOR CERVICAL DISCECTOMY FUSION, ALLOGRAFT, PLATE;  Surgeon: Eldred Manges, MD;  Location: [facility] OR;  Service: Orthopedics;  Laterality: N/A;   CATARACT EXTRACTION  04/2022   FOOT SURGERY     right   HERNIA REPAIR     LAPAROSCOPIC GASTRIC BANDING  08/2008   LAPAROSCOPIC ROUX-EN-Y GASTRIC BYPASS WITH UPPER ENDOSCOPY AND REMOVAL OF LAP BAND  2018   LEFT HEART CATHETERIZATION WITH CORONARY ANGIOGRAM N/A 05/02/2012   Procedure:  LEFT HEART CATHETERIZATION WITH CORONARY ANGIOGRAM;  Surgeon: Pamella Pert, MD;  Location: [facility] CATH LAB;  Service: Cardiovascular;  Laterality: N/A;   LUNG LOBECTOMY  2003   left   MASS EXCISION Right 06/06/2020   Procedure: REMOVAL MASS ON RIGHT BUTTOCK;  Surgeon: Karie Soda, MD;  Location: [facility] ORS;  Service: General;  Laterality: Right;   SPINE SURGERY     fusion on the neck   THYROIDECTOMY     partial   TONSILLECTOMY     VENTRAL HERNIA REPAIR N/A 06/06/2020  Procedure: LAPAROSCOPIC INCARCERATED INCISIONAL HERNIA REPAIR WITH MESH AND TAP BLOCK;  Surgeon: Karie Soda, MD;  Location: [facility] ORS;  Service: General;  Laterality: N/A;    Family History  Problem Relation Age of Onset   Stroke Mother    Stroke Father     Social History   Tobacco Use   Smoking status: Former    Current packs/day: 0.00    Average packs/day: 2.0 packs/day for 37.0 years (74.0 ttl pk-yrs)    Types: Cigarettes    Start date: 06/21/1965    Quit date: 06/21/2002    Years since quitting: 20.9   Smokeless tobacco: Never  Vaping Use   Vaping status: Never Used  Substance Use Topics   Alcohol use: Yes    Comment: [employer]   Drug use: No    ROS   Objective:   Vitals: BP 106/69 (BP Location: Right Arm)   Pulse (!) 110   Temp 99.8 F (37.7 C) (Oral)   SpO2 91%   Pulse oximetry maintained 94%-95% on 3 L of oxygen.  Physical Exam Constitutional:      General: She is not in acute distress.    Appearance: Normal appearance. She is well-developed and normal weight. She is ill-appearing. She is not toxic-appearing or diaphoretic.  HENT:     Head: Normocephalic and atraumatic.     Right Ear: Tympanic membrane, ear canal and external ear normal. No drainage or tenderness. No middle ear effusion. There is no impacted cerumen. Tympanic membrane is not erythematous or bulging.     Left Ear: Tympanic membrane, ear canal and external ear normal. No drainage or tenderness.  No middle ear  effusion. There is no impacted cerumen. Tympanic membrane is not erythematous or bulging.     Nose: Congestion present. No rhinorrhea.     Mouth/Throat:     Mouth: Mucous membranes are moist. No oral lesions.     Pharynx: No pharyngeal swelling, oropharyngeal exudate, posterior oropharyngeal erythema or uvula swelling.     Tonsils: No tonsillar exudate or tonsillar abscesses.  Eyes:     General: No scleral icterus.       Right eye: No discharge.        Left eye: No discharge.     Extraocular Movements: Extraocular movements intact.     Right eye: Normal extraocular motion.     Left eye: Normal extraocular motion.     Conjunctiva/sclera: Conjunctivae normal.  Cardiovascular:     Rate and Rhythm: Normal rate and regular rhythm.     Heart sounds: Normal heart sounds. No murmur heard.    No friction rub. No gallop.  Pulmonary:     Effort: Pulmonary effort is normal. No respiratory distress.     Breath sounds: No stridor. Rhonchi (mild over mid to upper lung fields bilaterally) present. No wheezing or rales.  Chest:     Chest wall: No tenderness.  Musculoskeletal:     Cervical back: Normal range of motion and neck supple.  Lymphadenopathy:     Cervical: No cervical adenopathy.  Skin:    General: Skin is warm and dry.  Neurological:     General: No focal deficit present.     Mental Status: She is alert and oriented to person, place, and time.  Psychiatric:        Mood and Affect: Mood normal.        Behavior: Behavior normal.     Assessment and Plan :   PDMP not reviewed this encounter.  1. Acute viral syndrome   2. COPD exacerbation (HCC)    X-ray over-read was pending at time of discharge, recommended follow up with only abnormal results. Otherwise will not call for negative over-read. Patient was in agreement.  Recommended an oral prednisone course for mild COPD exacerbation.  Start using oxygen at home.  Maintain inhalers, albuterol treatments.  Patient should undergo COVID  antiviral treatment should she test positive for COVID-19.  Maintain strict ER precautions.  Counseled patient on potential for adverse effects with medications prescribed today, patient verbalized understanding.    Wallis Bamberg, [place] 05/19/23 2130   UPDATE: DG Chest 2 View  Result Date: 05/19/2023 CLINICAL DATA:  Cough, hypoxia. EXAM: CHEST - 2 VIEW COMPARISON:  October 30 2022. FINDINGS: The heart size and mediastinal contours are within normal limits. New right basilar opacity is noted concerning for pneumonia or possibly atelectasis. Elevated left hemidiaphragm is noted with minimal left basilar atelectasis or scarring. The visualized skeletal structures are unremarkable. IMPRESSION: New right basilar opacity is noted concerning for pneumonia. Followup PA and lateral chest X-ray is recommended in 3-4 weeks following trial of antibiotic therapy to ensure resolution and exclude underlying malignancy. Electronically Signed   By: Lupita Raider M.D.   On: 05/19/2023 10:43    1. Pneumonia of right lower lobe due to infectious organism   2. Acute viral syndrome   3. COPD exacerbation (HCC)     Creatinine clearance calculated at 67 mL/min using the creatinine level from November 09, 2022.  Will plan to use double coverage as recommended with Augmentin and azithromycin.  Maintain treatment plan as discussed in clinic otherwise.    Wallis Bamberg, PA-C 05/19/23 1140

## 2023-05-20 ENCOUNTER — Telehealth: Payer: Self-pay | Admitting: Internal Medicine

## 2023-05-20 LAB — SARS CORONAVIRUS 2 (TAT 6-24 HRS): SARS Coronavirus 2: NEGATIVE

## 2023-05-20 NOTE — Telephone Encounter (Signed)
PT just release from the ER (Pneumonia). HAS made a HFU  appt but wants to speak to a nurse regarding medication. Her # is [phone]  Side is sore and blood in phlegm.

## 2023-05-21 NOTE — Telephone Encounter (Signed)
Called and spoke to patient.  She stated that she was seen at [facility] 10/30 and dx with PNA. Tx with prednisone, promethazine and azithromycin. According to chart, she was also prescribed Augmentin, however she stated that she did not pick up Augmentin. She is scheduled to see Dr. Isaiah Serge for HFU 11/8. C/o fatigued, runny nose, right sided back discomfort and increased SOB. Temp 97.9 yesterday. She has not measured her temp today. Yesterday she coughed up a small amount of blood. Today sputum is clear with no specs of blood. She increased oxygen to 3L. She does not monitor oxygen levels. She has not used albuterol since being sick. She is using Stiolto 2 puffs once daily.  Dr. Maple Hudson, please advise. Thanks   Current Outpatient Medications on File Prior to Visit  Medication Sig Dispense Refill   acidophilus (RISAQUAD) CAPS capsule Take 1 capsule by mouth daily. As needed (Patient not taking: Reported on 12/15/2022)     albuterol (PROVENTIL) (2.5 MG/3ML) 0.083% nebulizer solution Take 3 mLs (2.5 mg total) by nebulization every 8 (eight) hours as needed for wheezing or shortness of breath. USE 1 VIAL IN NEBULIZER EVERY 8 HOURS AS NEEDED FOR WHEEZING OR SHORTNESS OF BREATH 375 mL 0   albuterol (VENTOLIN HFA) 108 (90 Base) MCG/ACT inhaler Inhale 2 puffs into the lungs every 4 (four) hours as needed for wheezing or shortness of breath. 8.5 each 5   amoxicillin-clavulanate (AUGMENTIN) 875-125 MG tablet Take 1 tablet by mouth 2 (two) times daily. 20 tablet 0   azithromycin (ZITHROMAX) 250 MG tablet Day 1: take 2 tablets. Day 2-5: Take 1 tablet daily. 6 tablet 0   benzonatate (TESSALON) 200 MG capsule Take 1 capsule (200 mg total) by mouth 3 (three) times daily as needed for cough. 30 capsule 1   cetirizine (ZYRTEC) 10 MG tablet Take 10 mg by mouth daily.     chlorpheniramine-HYDROcodone (TUSSIONEX) 10-8 MG/5ML Take 5 mLs by mouth every 12 (twelve) hours as needed for cough. 115 mL 0   Cholecalciferol (VITAMIN  D3) 125 MCG (5000 UT) TABS Take 10,000 Units by mouth daily.     CVS ASPIRIN LOW DOSE 81 MG tablet TAKE 1 TABLET (81 MG TOTAL) BY MOUTH DAILY. SWALLOW WHOLE. 90 tablet 3   Cyanocobalamin (B-12) 5000 MCG CAPS Take 5,000 mcg by mouth daily.     diphenhydrAMINE (BENADRYL) 12.5 MG/5ML liquid 25 mg every 6 (six) hours as needed for allergies or itching.     fluconazole (DIFLUCAN) 150 MG tablet Take 1 tablet (150 mg total) by mouth every 3 (three) days. 3 tablet 0   FREESTYLE INSULINX TEST test strip AS DIRECTED TO OBTAIN BLOOD TWICE A DAY AS NEEDED DX CODE E11.69 90 DAYS     furosemide (LASIX) 20 MG tablet Take 20 mg by mouth daily as needed for edema or fluid.     gabapentin (NEURONTIN) 100 MG capsule Take 3 capsules (300 mg total) by mouth 3 (three) times daily. 90 capsule 1   glucose 4 GM chewable tablet Chew 1-3 tablets by mouth as needed for low blood sugar.     ketoconazole (NIZORAL) 2 % cream Apply 1 Application topically daily as needed for irritation. 60 g 2   Lancets (FREESTYLE) lancets      lidocaine (LIDODERM) 5 % Place 1 patch onto the skin daily. Remove & Discard patch within 12 hours or as directed by MD 30 patch 0   Menthol-Methyl Salicylate (SALONPAS PAIN RELIEF PATCH) 3-10 % PTCH Place 1 patch  onto the skin daily as needed (pain).     Nebulizers (COMPRESSOR/NEBULIZER) MISC Use as directed 1 each prn   nitroGLYCERIN (NITROSTAT) 0.4 MG SL tablet Place 1 tablet (0.4 mg total) under the tongue every 5 (five) minutes as needed for chest pain. If you require more than two tablets five minutes apart go to the nearest ER via EMS. 30 tablet 0   olmesartan-hydrochlorothiazide (BENICAR HCT) 20-12.5 MG tablet TAKE 1 TABLET BY MOUTH EVERY DAY (Patient taking differently: Take 1 tablet by mouth every other day.) 30 tablet 0   Polyethyl Glycol-Propyl Glycol (SYSTANE OP) Place 1 drop into both eyes 3 (three) times daily as needed (dry eyes).     predniSONE (DELTASONE) 20 MG tablet Take 2 tablets daily  with breakfast. 10 tablet 0   promethazine-dextromethorphan (PROMETHAZINE-DM) 6.25-15 MG/5ML syrup Take 5 mLs by mouth 3 (three) times daily as needed for cough. 200 mL 0   rosuvastatin (CRESTOR) 10 MG tablet Take 1 tablet (10 mg total) by mouth at bedtime. 90 tablet 0   Tiotropium Bromide-Olodaterol (STIOLTO RESPIMAT) 2.5-2.5 MCG/ACT AERS Inhale 2 puffs into the lungs daily. 1 each 12   trolamine salicylate (ASPERCREME) 10 % cream Apply 1 Application topically as needed for muscle pain.     No current facility-administered medications on file prior to visit.    No Known Allergies

## 2023-05-21 NOTE — Telephone Encounter (Signed)
I would suggest adding back the augmentin. Do we know why she didn't pick it up? Remind her to stay well-hydrated.

## 2023-05-21 NOTE — Telephone Encounter (Signed)
Patient is aware of below message/recommendations and voiced her understanding.  She will contact the pharmacy and ask that they fill the Augmentin.  Nothing further needed.

## 2023-05-25 ENCOUNTER — Ambulatory Visit: Payer: Medicare PPO | Admitting: Cardiology

## 2023-05-28 ENCOUNTER — Ambulatory Visit (INDEPENDENT_AMBULATORY_CARE_PROVIDER_SITE_OTHER): Payer: Medicare PPO | Admitting: Pulmonary Disease

## 2023-05-28 ENCOUNTER — Encounter: Payer: Self-pay | Admitting: Pulmonary Disease

## 2023-05-28 VITALS — BP 129/71 | HR 55 | Temp 98.7°F | Ht 60.0 in | Wt 223.8 lb

## 2023-05-28 DIAGNOSIS — J181 Lobar pneumonia, unspecified organism: Secondary | ICD-10-CM

## 2023-05-28 DIAGNOSIS — Z23 Encounter for immunization: Secondary | ICD-10-CM | POA: Diagnosis not present

## 2023-05-28 NOTE — Patient Instructions (Signed)
VISIT SUMMARY:  During today's visit, we discussed your recent illness and ongoing health concerns. You were diagnosed with pneumonia and had to stop antibiotics due to a reaction. We also reviewed your COPD management, oral discomfort, and chronic swelling in your legs. Additionally, we addressed general health maintenance and vaccinations.  YOUR PLAN:  -PNEUMONIA: Pneumonia is an infection that inflames the air sacs in one or both lungs. You were treated with antibiotics for right-sided pneumonia, and we will order a chest x-ray in one month to ensure it has resolved.  -COPD WITH CHRONIC HYPOXIA: Chronic Obstructive Pulmonary Disease (COPD) is a chronic inflammatory lung disease that causes obstructed airflow from the lungs. You are experiencing increased shortness of breath and lower oxygen levels. Continue using your current medications, albuterol and Stiolto, and ensure consistent use of your home oxygen.  -ORAL DISCOMFORT: You experienced oral discomfort and swelling of the tongue after antibiotic therapy. Continue using magic mouthwash as needed for relief.  -LOWER EXTREMITY EDEMA: Edema is swelling caused by excess fluid trapped in your body's tissues. You have chronic swelling in your legs. Continue taking Lasix as prescribed and consider using compression stockings and elevating your legs to manage the swelling.  -GENERAL HEALTH MAINTENANCE: You received the influenza vaccine today. It is also recommended that you receive the RSV, COVID, and pneumococcal vaccines. Please follow up with Dr. Maple Hudson for ongoing management.  INSTRUCTIONS:  Please schedule a chest x-ray in one month to check the resolution of your pneumonia. Follow up with Dr. Maple Hudson for ongoing management of your health conditions.

## 2023-05-28 NOTE — Progress Notes (Signed)
[name]    [id number]    12/28/1945  Primary Care Physician:Clelland, Marlaine Hind, PA  Referring Physician: Delma Officer, PA [address] [place],  [place] [address]  Chief complaint:   Acute visit for pneumonia  HPI: 77 y.o. who  has a past medical history of Abdominal pain, Anxiety, Arthritis, Bipolar disorder (HCC), Breast pain in female, Bruises easily, Cancer (HCC) (2003), Cervical disc disease, Chronic kidney disease, COPD (chronic obstructive pulmonary disease) (HCC), Depression, DJD (degenerative joint disease), Dyspnea, Gum disease, Hernia, Hypoglycemic episode in patient with diabetes mellitus (HCC) (2021), Hypothyroidism, IDA (iron deficiency anemia) (03/27/2021), Insomnia, Obstructive sleep apnea (08/06/2013), Phlebitis, Pneumonia (03/2022), Pre-diabetes, Thyroid disease, Walker as ambulation aid, Wears glasses, and Weight increase.   Discussed the use of AI scribe software for clinical note transcription with the patient, who gave verbal consent to proceed.  The patient, with a history of COPD, chronic hypoxic respiratory failure, left lower lobe resections due to lung cancer.   presents with a week-long illness characterized by an upset stomach, fever, achiness, and lightheadedness. The patient sought care at an emergency clinic on 10/30 where she was diagnosed with pneumonia and prescribed Augmentin, azithromycin and prednisone.  Chest x-ray showed right lower lobe infiltrate.  She finished the prednisone, azithromycin but had to discontinue the Augmentin after 8 days due to a reaction that caused mouth sores.  ED records reviewed in detail  The patient also reports feeling very lightheaded when moving around and has been monitoring her oxygen levels at home, which have been in the high eighties. The patient has also been experiencing swelling in her ankles and feet, which started earlier this year and has not been resolved despite various  tests and treatments. The patient's energy levels are slowly recovering, but she still feels weak and has little appetite.    Outpatient Encounter Medications as of 05/28/2023  Medication Sig   acidophilus (RISAQUAD) CAPS capsule Take 1 capsule by mouth daily. As needed   albuterol (PROVENTIL) (2.5 MG/3ML) 0.083% nebulizer solution Take 3 mLs (2.5 mg total) by nebulization every 8 (eight) hours as needed for wheezing or shortness of breath. USE 1 VIAL IN NEBULIZER EVERY 8 HOURS AS NEEDED FOR WHEEZING OR SHORTNESS OF BREATH   albuterol (VENTOLIN HFA) 108 (90 Base) MCG/ACT inhaler Inhale 2 puffs into the lungs every 4 (four) hours as needed for wheezing or shortness of breath.   cetirizine (ZYRTEC) 10 MG tablet Take 10 mg by mouth daily.   Cholecalciferol (VITAMIN D3) 125 MCG (5000 UT) TABS Take 10,000 Units by mouth daily.   [employer] ASPIRIN LOW DOSE 81 MG tablet TAKE 1 TABLET (81 MG TOTAL) BY MOUTH DAILY. SWALLOW WHOLE.   Cyanocobalamin (B-12) 5000 MCG CAPS Take 5,000 mcg by mouth daily.   diphenhydrAMINE (BENADRYL) 12.5 MG/5ML liquid 25 mg every 6 (six) hours as needed for allergies or itching.   FREESTYLE INSULINX TEST test strip AS DIRECTED TO OBTAIN BLOOD TWICE A DAY AS NEEDED DX CODE E11.69 90 DAYS   furosemide (LASIX) 20 MG tablet Take 20 mg by mouth daily as needed for edema or fluid.   gabapentin (NEURONTIN) 100 MG capsule Take 3 capsules (300 mg total) by mouth 3 (three) times daily.   glucose 4 GM chewable tablet Chew 1-3 tablets by mouth as needed for low blood sugar.   ketoconazole (NIZORAL) 2 % cream Apply 1 Application topically daily as needed for irritation.   Lancets (  FREESTYLE) lancets    lidocaine (LIDODERM) 5 % Place 1 patch onto the skin daily. Remove & Discard patch within 12 hours or as directed by MD   Menthol-Methyl Salicylate (SALONPAS PAIN RELIEF PATCH) 3-10 % PTCH Place 1 patch onto the skin daily as needed (pain).   Nebulizers (COMPRESSOR/NEBULIZER) MISC Use as directed    olmesartan-hydrochlorothiazide (BENICAR HCT) 20-12.5 MG tablet TAKE 1 TABLET BY MOUTH EVERY DAY (Patient taking differently: Take 1 tablet by mouth every other day.)   Polyethyl Glycol-Propyl Glycol (SYSTANE OP) Place 1 drop into both eyes 3 (three) times daily as needed (dry eyes).   promethazine-dextromethorphan (PROMETHAZINE-DM) 6.25-15 MG/5ML syrup Take 5 mLs by mouth 3 (three) times daily as needed for cough.   Tiotropium Bromide-Olodaterol (STIOLTO RESPIMAT) 2.5-2.5 MCG/ACT AERS Inhale 2 puffs into the lungs daily.   trolamine salicylate (ASPERCREME) 10 % cream Apply 1 Application topically as needed for muscle pain.   nitroGLYCERIN (NITROSTAT) 0.4 MG SL tablet Place 1 tablet (0.4 mg total) under the tongue every 5 (five) minutes as needed for chest pain. If you require more than two tablets five minutes apart go to the nearest ER via EMS.   rosuvastatin (CRESTOR) 10 MG tablet Take 1 tablet (10 mg total) by mouth at bedtime.   [DISCONTINUED] amoxicillin-clavulanate (AUGMENTIN) 875-125 MG tablet Take 1 tablet by mouth 2 (two) times daily.   [DISCONTINUED] azithromycin (ZITHROMAX) 250 MG tablet Day 1: take 2 tablets. Day 2-5: Take 1 tablet daily.   [DISCONTINUED] benzonatate (TESSALON) 200 MG capsule Take 1 capsule (200 mg total) by mouth 3 (three) times daily as needed for cough.   [DISCONTINUED] chlorpheniramine-HYDROcodone (TUSSIONEX) 10-8 MG/5ML Take 5 mLs by mouth every 12 (twelve) hours as needed for cough.   [DISCONTINUED] fluconazole (DIFLUCAN) 150 MG tablet Take 1 tablet (150 mg total) by mouth every 3 (three) days.   [DISCONTINUED] predniSONE (DELTASONE) 20 MG tablet Take 2 tablets daily with breakfast.   No facility-administered encounter medications on file as of 05/28/2023.   Physical Exam: Blood pressure 129/71, pulse (!) 55, temperature 98.7 F (37.1 C), temperature source Oral, height 5' (1.524 m), weight 223 lb 12.8 oz (101.5 kg), SpO2 92%. Gen:      No acute distress HEENT:   EOMI, sclera anicteric Neck:     No masses; no thyromegaly Lungs:    Clear to auscultation bilaterally; normal respiratory effort CV:         Regular rate and rhythm; no murmurs Abd:      + bowel sounds; soft, non-tender; no palpable masses, no distension Ext:    No edema; adequate peripheral perfusion Skin:      Warm and dry; no rash Neuro: alert and oriented x 3 Psych: normal mood and affect   Assessment:  Acute visit for pneumonia Recent diagnosis of right-sided pneumonia treated with Augmentin and azithromycin. Completed at least 7 days of antibiotics. Current symptoms include slow recovery of energy and right-sided chest soreness. No current wheezing noted on examination. -Order chest x-ray in one month to ensure resolution of pneumonia.  COPD with chronic hypoxia Patient on home oxygen, recently experienced increased shortness of breath with activity and decreased oxygen saturation levels in the high 80s. Currently not wheezing on examination. -Continue current COPD management with albuterol and Stiolto. -Encourage consistent use of home oxygen.  Oral discomfort Patient reports oral discomfort and swelling of the tongue after antibiotic therapy. No evidence of oral thrush on examination. -Continue use of magic mouthwash as needed for symptomatic  relief.  Lower extremity edema Chronic bilateral lower extremity edema, cause unknown. Cardiac workup including ultrasound showed normal heart function and no evidence of pulmonary hypertension. -Continue Lasix as prescribed. -Consider use of compression stockings and leg elevation to manage swelling.  General Health Maintenance -Administer influenza vaccine today. -Recommend patient to receive RSV, COVID, and pneumococcal vaccines. -Follow up with Dr. Maple Hudson for ongoing management.   Chilton Greathouse MD [facility] 05/28/2023, 11:34 AM  CC: Clelland, Marlaine Hind, PA

## 2023-06-01 DIAGNOSIS — M170 Bilateral primary osteoarthritis of knee: Secondary | ICD-10-CM | POA: Diagnosis not present

## 2023-06-01 DIAGNOSIS — M25561 Pain in right knee: Secondary | ICD-10-CM | POA: Diagnosis not present

## 2023-06-01 DIAGNOSIS — M25562 Pain in left knee: Secondary | ICD-10-CM | POA: Diagnosis not present

## 2023-06-04 DIAGNOSIS — Z1330 Encounter for screening examination for mental health and behavioral disorders, unspecified: Secondary | ICD-10-CM | POA: Diagnosis not present

## 2023-06-04 DIAGNOSIS — J9611 Chronic respiratory failure with hypoxia: Secondary | ICD-10-CM | POA: Diagnosis not present

## 2023-06-04 DIAGNOSIS — M1711 Unilateral primary osteoarthritis, right knee: Secondary | ICD-10-CM | POA: Diagnosis not present

## 2023-06-04 DIAGNOSIS — J449 Chronic obstructive pulmonary disease, unspecified: Secondary | ICD-10-CM | POA: Diagnosis not present

## 2023-06-04 DIAGNOSIS — M47816 Spondylosis without myelopathy or radiculopathy, lumbar region: Secondary | ICD-10-CM | POA: Diagnosis not present

## 2023-06-04 DIAGNOSIS — R531 Weakness: Secondary | ICD-10-CM | POA: Diagnosis not present

## 2023-06-09 ENCOUNTER — Telehealth: Payer: Self-pay | Admitting: Internal Medicine

## 2023-06-09 NOTE — Telephone Encounter (Signed)
We received a form from [facility] requesting information to support the patient's request for reasonable accommodations under the ADA.   I called the patient and she is requesting she be moved into a handicap accessible apartment that meets all the needs for her disabilities and this requires supporting documentation from her physician.  She is on oxygen and uses a walker.  She is unable to step in and out of her bathtub and needs a walk-in shower.  She also needs a high commode and grab bars.   The patient stated these apartments are available in her living community, but she is being told she does not qualify.  I have provided the form to Dr. Maple Hudson and asked him to complete the questions.  The patient has asked that I email her a copy once it is signed because the complex manager will not show her even the blank form that was submitted to Dr. Maple Hudson.

## 2023-06-11 NOTE — Telephone Encounter (Signed)
Faxed signed accommodation request form to [facility] fax# [phone], attn: Felipe Drone.   Emailed copy to patient.

## 2023-06-21 DIAGNOSIS — Z0000 Encounter for general adult medical examination without abnormal findings: Secondary | ICD-10-CM | POA: Diagnosis not present

## 2023-06-21 DIAGNOSIS — J9611 Chronic respiratory failure with hypoxia: Secondary | ICD-10-CM | POA: Diagnosis not present

## 2023-06-21 DIAGNOSIS — Z9981 Dependence on supplemental oxygen: Secondary | ICD-10-CM | POA: Diagnosis not present

## 2023-06-21 DIAGNOSIS — Z6841 Body Mass Index (BMI) 40.0 and over, adult: Secondary | ICD-10-CM | POA: Diagnosis not present

## 2023-06-21 DIAGNOSIS — N1831 Chronic kidney disease, stage 3a: Secondary | ICD-10-CM | POA: Diagnosis not present

## 2023-06-21 DIAGNOSIS — F321 Major depressive disorder, single episode, moderate: Secondary | ICD-10-CM | POA: Diagnosis not present

## 2023-06-21 DIAGNOSIS — J449 Chronic obstructive pulmonary disease, unspecified: Secondary | ICD-10-CM | POA: Diagnosis not present

## 2023-06-21 DIAGNOSIS — I700 Atherosclerosis of aorta: Secondary | ICD-10-CM | POA: Diagnosis not present

## 2023-06-21 DIAGNOSIS — Z9181 History of falling: Secondary | ICD-10-CM | POA: Diagnosis not present

## 2023-06-21 DIAGNOSIS — E7800 Pure hypercholesterolemia, unspecified: Secondary | ICD-10-CM | POA: Diagnosis not present

## 2023-06-21 DIAGNOSIS — Z1331 Encounter for screening for depression: Secondary | ICD-10-CM | POA: Diagnosis not present

## 2023-06-21 DIAGNOSIS — E1122 Type 2 diabetes mellitus with diabetic chronic kidney disease: Secondary | ICD-10-CM | POA: Diagnosis not present

## 2023-06-24 NOTE — Telephone Encounter (Signed)
Patient called about the accommodation form - I let her know we faxed it on 06/11/23 and that I emailed her a copy.   She did not get the email, so I sent it again while she was on the phone.  It went to her SPAM box and then she was not able to successfully open it.  I have mailed her a hard copy and faxed the form again to [employer].

## 2023-07-04 DIAGNOSIS — J9611 Chronic respiratory failure with hypoxia: Secondary | ICD-10-CM | POA: Diagnosis not present

## 2023-07-04 DIAGNOSIS — J449 Chronic obstructive pulmonary disease, unspecified: Secondary | ICD-10-CM | POA: Diagnosis not present

## 2023-07-06 ENCOUNTER — Emergency Department (HOSPITAL_BASED_OUTPATIENT_CLINIC_OR_DEPARTMENT_OTHER): Payer: Medicare PPO

## 2023-07-06 ENCOUNTER — Emergency Department (HOSPITAL_BASED_OUTPATIENT_CLINIC_OR_DEPARTMENT_OTHER)
Admission: EM | Admit: 2023-07-06 | Discharge: 2023-07-06 | Disposition: A | Discharge status: Home / Self Care | Payer: Medicare PPO | Attending: Emergency Medicine | Admitting: Emergency Medicine

## 2023-07-06 ENCOUNTER — Encounter (HOSPITAL_BASED_OUTPATIENT_CLINIC_OR_DEPARTMENT_OTHER): Payer: Self-pay | Admitting: Urology

## 2023-07-06 ENCOUNTER — Other Ambulatory Visit: Payer: Self-pay

## 2023-07-06 ENCOUNTER — Other Ambulatory Visit (HOSPITAL_BASED_OUTPATIENT_CLINIC_OR_DEPARTMENT_OTHER): Payer: Self-pay

## 2023-07-06 DIAGNOSIS — M170 Bilateral primary osteoarthritis of knee: Secondary | ICD-10-CM | POA: Diagnosis not present

## 2023-07-06 DIAGNOSIS — Z85118 Personal history of other malignant neoplasm of bronchus and lung: Secondary | ICD-10-CM | POA: Diagnosis not present

## 2023-07-06 DIAGNOSIS — N189 Chronic kidney disease, unspecified: Secondary | ICD-10-CM | POA: Diagnosis not present

## 2023-07-06 DIAGNOSIS — Z87891 Personal history of nicotine dependence: Secondary | ICD-10-CM | POA: Diagnosis not present

## 2023-07-06 DIAGNOSIS — J449 Chronic obstructive pulmonary disease, unspecified: Secondary | ICD-10-CM | POA: Insufficient documentation

## 2023-07-06 DIAGNOSIS — G8929 Other chronic pain: Secondary | ICD-10-CM

## 2023-07-06 DIAGNOSIS — I82812 Embolism and thrombosis of superficial veins of left lower extremities: Secondary | ICD-10-CM | POA: Diagnosis not present

## 2023-07-06 DIAGNOSIS — M25561 Pain in right knee: Secondary | ICD-10-CM | POA: Diagnosis not present

## 2023-07-06 DIAGNOSIS — I8392 Asymptomatic varicose veins of left lower extremity: Secondary | ICD-10-CM | POA: Diagnosis not present

## 2023-07-06 DIAGNOSIS — M79605 Pain in left leg: Secondary | ICD-10-CM | POA: Diagnosis not present

## 2023-07-06 DIAGNOSIS — E039 Hypothyroidism, unspecified: Secondary | ICD-10-CM | POA: Insufficient documentation

## 2023-07-06 DIAGNOSIS — M25562 Pain in left knee: Secondary | ICD-10-CM | POA: Diagnosis not present

## 2023-07-06 LAB — GLUCOSE, CAPILLARY: Glucose-Capillary: 93 mg/dL (ref 70–99)

## 2023-07-06 MED ORDER — IBUPROFEN 600 MG PO TABS
600.0000 mg | ORAL_TABLET | Freq: Four times a day (QID) | ORAL | 0 refills | Status: DC | PRN
  Filled 2023-07-06: qty 30, 8d supply, fill #0

## 2023-07-06 MED ORDER — IBUPROFEN 800 MG PO TABS
800.0000 mg | ORAL_TABLET | Freq: Once | ORAL | Status: AC
  Administered 2023-07-06: 800 mg via ORAL
  Filled 2023-07-06: qty 1

## 2023-07-06 MED ORDER — ACETAMINOPHEN 500 MG PO TABS
1000.0000 mg | ORAL_TABLET | Freq: Once | ORAL | Status: AC
  Administered 2023-07-06: 1000 mg via ORAL
  Filled 2023-07-06: qty 2

## 2023-07-06 MED ORDER — OXYCODONE HCL 5 MG PO TABS
5.0000 mg | ORAL_TABLET | Freq: Once | ORAL | Status: AC
  Administered 2023-07-06: 5 mg via ORAL
  Filled 2023-07-06: qty 1

## 2023-07-06 MED ORDER — CEPHALEXIN 500 MG PO CAPS
500.0000 mg | ORAL_CAPSULE | Freq: Three times a day (TID) | ORAL | 0 refills | Status: AC
  Filled 2023-07-06: qty 21, 7d supply, fill #0

## 2023-07-06 MED ORDER — OXYCODONE-ACETAMINOPHEN 5-325 MG PO TABS: 1.0000 | ORAL_TABLET | Freq: Once | ORAL | Status: DC

## 2023-07-06 MED ORDER — ACETAMINOPHEN 325 MG PO TABS
650.0000 mg | ORAL_TABLET | Freq: Four times a day (QID) | ORAL | 0 refills | Status: AC | PRN
  Filled 2023-07-06: qty 100, 13d supply, fill #0

## 2023-07-06 MED ORDER — OXYCODONE HCL 5 MG PO TABS
2.5000 mg | ORAL_TABLET | ORAL | 0 refills | Status: DC | PRN
  Filled 2023-07-06: qty 10, 2d supply, fill #0

## 2023-07-06 NOTE — ED Notes (Signed)
RT asked to see pt for shortness of breath moving from wheelchair to bed. SpO2 as low as 86% during. Upon arrival to pt room, pt resting comfortably, SpO2 96% on Room air, Diminished bilateral breath sounds. Pt able to speak in complete sentences at this time and maintain SpO2 above 92%. Pt endorses hx of COPD, sees Dr. Maple Hudson (pulmonary) and wears 2-3L oxygen as needed at home. Pt takes albuterol prn at home and has not taken any today and does not feel she needs it at this time. RT will continue to monitor and be available as needed.

## 2023-07-06 NOTE — ED Triage Notes (Signed)
Pt states left knee pain  States tender and burning that started Friday  Pt states right severe pain  Very minimal weight bearing at this time    Renette Butters on 12/13

## 2023-07-06 NOTE — Discharge Instructions (Addendum)
It was a pleasure caring for you today in the emergency department.  Recommend you follow-up orthopedics in regards to chronic pain of your knees.  Also follow-up with your PCP for further monitoring of superficial venous thrombosis of your left leg.  Recommend repeat ultrasound in the outpatient setting to evaluate if the clot is propagating or getting worse. Discuss with your PCP  Please return to the emergency department for any worsening or worrisome symptoms.

## 2023-07-06 NOTE — ED Provider Notes (Signed)
[facility] Provider Note  CSN: [id number] Arrival date & time: 07/06/23 4098  Chief Complaint(s) Knee Pain  HPI [name] is a 77 y.o. female with past medical history as below, significant for anxiety, bipolar disorder, CKD, COPD, IDA, obesity, arthritis who presents to the ED with complaint of bilateral knee pain  Knee pain over the last few days bilateral.  Worsened than prior knee pain.  Noticed some redness and swelling distal to her left knee.  Difficulty with ambulation secondary to knee pain bilateral.  She uses a walker at home.  Follows orthopedics, has received injections in the past steroid/gels without much relief previously.  She has been taking Motrin/Tylenol at home without much relief.  No fevers, chills, nausea or vomiting, no abdominal pain.  No wounds, no recent travel, no history of DVT  Past Medical History Past Medical History:  Diagnosis Date   Abdominal pain    around naval   Anxiety    Arthritis    Bipolar disorder (HCC)    Breast pain in female    Bruises easily    Cancer (HCC) 2003   left lung   Cervical disc disease    Chronic kidney disease    COPD (chronic obstructive pulmonary disease) (HCC)    O2 as needed.   Depression    DJD (degenerative joint disease)    Dyspnea    uses O2 via [place] 2-3L PRN   Gum disease    Hernia    Hypoglycemic episode in patient with diabetes mellitus (HCC) 2021   Diet controlled diabetes with occasional hypoglycemia at night   Hypothyroidism    no meds   IDA (iron deficiency anemia) 03/27/2021   in [place]   Insomnia    not currently a problem   Obstructive sleep apnea 08/06/2013   Phlebitis    Pneumonia 03/2022   mild case   Pre-diabetes    diet controlled, no meds   Thyroid disease    Walker as ambulation aid    Wears glasses    Weight increase    Patient Active Problem List   Diagnosis Date Noted   Rib pain on left side 11/24/2022   Chronic back  pain 11/24/2022   Depression 11/24/2022   Costochondritis 10/30/2022   Impingement syndrome of right shoulder 09/22/2022   S/P cervical spinal fusion 06/21/2022   Cervical stenosis of spine 06/05/2022   Sinusitis, acute maxillary 03/31/2022   Left lower lobe pneumonia 03/31/2022   Change in bowel habit 11/24/2021   Epigastric pain 11/24/2021   Internal hemorrhoids 11/24/2021   History of colonic polyps 11/24/2021   Urticaria 11/24/2021   Spinal stenosis of cervical region 11/05/2021   Pincer nail deformity 09/05/2021   History of diabetes mellitus 03/20/2021   Iron deficiency 03/20/2021   Pruritus 01/09/2021   Pain in left leg 12/23/2020   Prediabetes 11/18/2020   Incarcerated incisional hernia s/p lap repair w mesh 06/06/2020 06/06/2020   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   Polyp of colon 10/23/2019   Chronic respiratory failure with hypoxia (HCC) 04/01/2019   Medication management 02/28/2019   Combined forms of age-related cataract of both eyes 12/01/2018   Early stage nonexudative age-related macular degeneration of both eyes 12/01/2018   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   PVD (posterior vitreous detachment), left 11/28/2018   Dysphagia 12/30/2017   Asthma 04/12/2017   BMI 40.0-44.9, adult (HCC) 04/12/2017   Irritable bowel syndrome 04/12/2017  Osteopenia 04/12/2017   Umbilical hernia without obstruction and without gangrene 03/18/2017   Hiatal hernia 01/26/2017   Vitamin D deficiency 06/16/2016   Encounter for preoperative examination for general surgical procedure 03/06/2016   Hypercholesterolemia 02/25/2016   Hypertensive disorder 02/25/2016   Cough 08/02/2015   Obstructive sleep apnea 08/06/2013   Neoplasm of breast 07/20/2013   COPD with acute bronchitis (HCC) 06/18/2013   Myalgia 06/18/2013   History of laparoscopic adjustable gastric banding, APS. 09/04/2008. 04/08/2011   History of lung cancer 06/27/2010   SKIN RASH 12/13/2009   DYSPNEA  07/05/2008   DIABETES, TYPE 2 07/01/2008   Obesity, morbid (HCC) 07/01/2008   Type 2 diabetes mellitus without complications (HCC) 07/01/2008   COPD mixed type (HCC) 06/26/2008   Malignant neoplasm of lung (HCC) 07/20/2001   Home Medication(s) Prior to Admission medications   Medication Sig Start Date End Date Taking? Authorizing Provider  acetaminophen (TYLENOL) 325 MG tablet Take 2 tablets (650 mg total) by mouth every 6 (six) hours as needed. 07/06/23  Yes Tanda Rockers A, DO  cephALEXin (KEFLEX) 500 MG capsule Take 1 capsule (500 mg total) by mouth 3 (three) times daily for 7 days. 07/06/23 07/13/23 Yes Tanda Rockers A, DO  ibuprofen (ADVIL) 600 MG tablet Take 1 tablet (600 mg total) by mouth every 6 (six) hours as needed. 07/06/23  Yes Tanda Rockers A, DO  oxyCODONE (ROXICODONE) 5 MG immediate release tablet Take 0.5-1 tablets (2.5-5 mg total) by mouth every 4 (four) hours as needed for severe pain (pain score 7-10). 07/06/23  Yes Tanda Rockers A, DO  acidophilus (RISAQUAD) CAPS capsule Take 1 capsule by mouth daily. As needed    [provider]  albuterol (PROVENTIL) (2.5 MG/3ML) 0.083% nebulizer solution Take 3 mLs (2.5 mg total) by nebulization every 8 (eight) hours as needed for wheezing or shortness of breath. USE 1 VIAL IN NEBULIZER EVERY 8 HOURS AS NEEDED FOR WHEEZING OR SHORTNESS OF BREATH 02/23/23   Jetty Duhamel D, MD  albuterol (VENTOLIN HFA) 108 (90 Base) MCG/ACT inhaler Inhale 2 puffs into the lungs every 4 (four) hours as needed for wheezing or shortness of breath. 03/04/23   Waymon Budge, MD  cetirizine (ZYRTEC) 10 MG tablet Take 10 mg by mouth daily.    [provider]  Cholecalciferol (VITAMIN D3) 125 MCG (5000 UT) TABS Take 10,000 Units by mouth daily.    [provider]  [employer] ASPIRIN LOW DOSE 81 MG tablet TAKE 1 TABLET (81 MG TOTAL) BY MOUTH DAILY. SWALLOW WHOLE. 11/13/22   Tolia, Sunit, DO  Cyanocobalamin (B-12) 5000 MCG CAPS Take 5,000 mcg by mouth  daily.    [provider]  diphenhydrAMINE (BENADRYL) 12.5 MG/5ML liquid 25 mg every 6 (six) hours as needed for allergies or itching.    [provider]  FREESTYLE INSULINX TEST test strip AS DIRECTED TO OBTAIN BLOOD TWICE A DAY AS NEEDED DX CODE E11.69 90 DAYS 12/31/20   [provider]  furosemide (LASIX) 20 MG tablet Take 20 mg by mouth daily as needed for edema or fluid.    [provider]  gabapentin (NEURONTIN) 100 MG capsule Take 3 capsules (300 mg total) by mouth 3 (three) times daily. 06/19/22   Eldred Manges, MD  glucose 4 GM chewable tablet Chew 1-3 tablets by mouth as needed for low blood sugar.    [provider]  ketoconazole (NIZORAL) 2 % cream Apply 1 Application topically daily as needed for irritation. 06/04/22  Delories Heinz, DPM  Lancets (FREESTYLE) lancets  12/31/20   [provider]  lidocaine (LIDODERM) 5 % Place 1 patch onto the skin daily. Remove & Discard patch within 12 hours or as directed by MD 10/30/22   Allison Quarry, Ruby Cola, NP  Menthol-Methyl Salicylate (SALONPAS PAIN RELIEF PATCH) 3-10 % PTCH Place 1 patch onto the skin daily as needed (pain).    [provider]  Nebulizers (COMPRESSOR/NEBULIZER) MISC Use as directed 06/28/14   Jetty Duhamel D, MD  nitroGLYCERIN (NITROSTAT) 0.4 MG SL tablet Place 1 tablet (0.4 mg total) under the tongue every 5 (five) minutes as needed for chest pain. If you require more than two tablets five minutes apart go to the nearest ER via EMS. 11/11/21 10/22/22  Tolia, Sunit, DO  olmesartan-hydrochlorothiazide (BENICAR HCT) 20-12.5 MG tablet TAKE 1 TABLET BY MOUTH EVERY DAY Patient taking differently: Take 1 tablet by mouth every other day. 12/04/21   Tolia, Sunit, DO  Polyethyl Glycol-Propyl Glycol (SYSTANE OP) Place 1 drop into both eyes 3 (three) times daily as needed (dry eyes).    [provider]  promethazine-dextromethorphan (PROMETHAZINE-DM) 6.25-15 MG/5ML syrup Take  5 mLs by mouth 3 (three) times daily as needed for cough. 05/19/23   Wallis Bamberg, PA-C  rosuvastatin (CRESTOR) 10 MG tablet Take 1 tablet (10 mg total) by mouth at bedtime. 11/11/21 10/22/22  Tolia, Sunit, DO  Tiotropium Bromide-Olodaterol (STIOLTO RESPIMAT) 2.5-2.5 MCG/ACT AERS Inhale 2 puffs into the lungs daily. 09/21/22   Jetty Duhamel D, MD  trolamine salicylate (ASPERCREME) 10 % cream Apply 1 Application topically as needed for muscle pain.    [provider]                                                                                                                                    Past Surgical History Past Surgical History:  Procedure Laterality Date   ABDOMINAL HYSTERECTOMY     partial   ANTERIOR CERVICAL DECOMP/DISCECTOMY FUSION N/A 06/05/2022   Procedure: C3-4 ANTERIOR CERVICAL DISCECTOMY FUSION, ALLOGRAFT, PLATE;  Surgeon: Eldred Manges, MD;  Location: [facility] OR;  Service: Orthopedics;  Laterality: N/A;   CATARACT EXTRACTION  04/2022   FOOT SURGERY     right   HERNIA REPAIR     LAPAROSCOPIC GASTRIC BANDING  08/2008   LAPAROSCOPIC ROUX-EN-Y GASTRIC BYPASS WITH UPPER ENDOSCOPY AND REMOVAL OF LAP BAND  2018   LEFT HEART CATHETERIZATION WITH CORONARY ANGIOGRAM N/A 05/02/2012   Procedure: LEFT HEART CATHETERIZATION WITH CORONARY ANGIOGRAM;  Surgeon: Pamella Pert, MD;  Location: [facility] CATH LAB;  Service: Cardiovascular;  Laterality: N/A;   LUNG LOBECTOMY  2003   left   MASS EXCISION Right 06/06/2020   Procedure: REMOVAL MASS ON RIGHT BUTTOCK;  Surgeon: Karie Soda, MD;  Location: [facility] ORS;  Service: General;  Laterality: Right;   SPINE SURGERY     fusion on the neck   THYROIDECTOMY  partial   TONSILLECTOMY     VENTRAL HERNIA REPAIR N/A 06/06/2020   Procedure: LAPAROSCOPIC INCARCERATED INCISIONAL HERNIA REPAIR WITH MESH AND TAP BLOCK;  Surgeon: Karie Soda, MD;  Location: [facility] ORS;  Service: General;  Laterality: N/A;   Family History Family History  Problem  Relation Age of Onset   Stroke Mother    Stroke Father     Social History Social History   Tobacco Use   Smoking status: Former    Current packs/day: 0.00    Average packs/day: 2.0 packs/day for 37.0 years (74.0 ttl pk-yrs)    Types: Cigarettes    Start date: 06/21/1965    Quit date: 06/21/2002    Years since quitting: 21.0   Smokeless tobacco: Never  Vaping Use   Vaping status: Never Used  Substance Use Topics   Alcohol use: Yes    Comment: [place] Liquor/WIne   Drug use: No   Allergies Amoxicillin  Review of Systems Review of Systems  Constitutional:  Negative for appetite change and fever.  Respiratory:  Negative for chest tightness and shortness of breath.   Cardiovascular:  Positive for leg swelling.  Gastrointestinal:  Negative for abdominal pain, nausea and vomiting.  Genitourinary:  Negative for dysuria.  Musculoskeletal:  Positive for arthralgias and gait problem.  Neurological:  Negative for numbness.  All other systems reviewed and are negative.   Physical Exam Vital Signs  I have reviewed the triage vital signs BP (!) 154/84 (BP Location: Left Arm)   Pulse 70   Temp 98.4 F (36.9 C)   Resp 18   Ht 5' (1.524 m)   Wt 101.5 kg   SpO2 94%   BMI 43.70 kg/m  Physical Exam Vitals and nursing note reviewed.  Constitutional:      General: She is not in acute distress.    Appearance: Normal appearance. She is well-developed. She is not ill-appearing.  HENT:     Head: Normocephalic and atraumatic.     Right Ear: External ear normal.     Left Ear: External ear normal.     Nose: Nose normal.     Mouth/Throat:     Mouth: Mucous membranes are moist.  Eyes:     General: No scleral icterus.       Right eye: No discharge.        Left eye: No discharge.  Cardiovascular:     Rate and Rhythm: Normal rate.  Pulmonary:     Effort: Pulmonary effort is normal. No respiratory distress.     Breath sounds: No stridor.  Abdominal:     General: Abdomen is flat.  There is no distension.     Tenderness: There is no guarding.  Musculoskeletal:        General: No deformity.     Cervical back: No rigidity.       Legs:     Comments: There is moderate erythema, tenderness posterior aspect of calf, pulses intact bilateral DP/ PT  Skin:    General: Skin is warm and dry.     Coloration: Skin is not cyanotic, jaundiced or pale.  Neurological:     Mental Status: She is alert and oriented to person, place, and time.     GCS: GCS eye subscore is 4. GCS verbal subscore is 5. GCS motor subscore is 6.  Psychiatric:        Speech: Speech normal.        Behavior: Behavior normal. Behavior is cooperative.     ED  Results and Treatments Labs (all labs ordered are listed, but only abnormal results are displayed) Labs Reviewed  GLUCOSE, CAPILLARY                                                                                                                          Radiology DG Knee Complete 4 Views Left Result Date: 07/06/2023 CLINICAL DATA:  Bilateral knee pain, greater on the right. EXAM: LEFT KNEE - COMPLETE 4+ VIEW; RIGHT KNEE - COMPLETE 4+ VIEW COMPARISON:  None available FINDINGS: Left knee: Minimal spurring of the patellofemoral and medial compartments. No fracture or dislocation. Soft tissues are unremarkable. Right knee: Mild joint space loss and spurring of the medial and lateral compartments. Mild spurring of the patellofemoral compartment. No fracture or dislocation. Soft tissues are unremarkable. IMPRESSION: 1. No acute abnormality of the knees. 2. Bilateral knee osteoarthrosis, worse on the right. Electronically Signed   By: Acquanetta Belling M.D.   On: 07/06/2023 11:18   DG Knee Complete 4 Views Right Result Date: 07/06/2023 CLINICAL DATA:  Bilateral knee pain, greater on the right. EXAM: LEFT KNEE - COMPLETE 4+ VIEW; RIGHT KNEE - COMPLETE 4+ VIEW COMPARISON:  None available FINDINGS: Left knee: Minimal spurring of the patellofemoral and medial  compartments. No fracture or dislocation. Soft tissues are unremarkable. Right knee: Mild joint space loss and spurring of the medial and lateral compartments. Mild spurring of the patellofemoral compartment. No fracture or dislocation. Soft tissues are unremarkable. IMPRESSION: 1. No acute abnormality of the knees. 2. Bilateral knee osteoarthrosis, worse on the right. Electronically Signed   By: Acquanetta Belling M.D.   On: 07/06/2023 11:18   US Venous Img Lower  Left (DVT Study) Result Date: 07/06/2023 CLINICAL DATA:  Lower extremity pain EXAM: LEFT LOWER EXTREMITY VENOUS DOPPLER ULTRASOUND TECHNIQUE: Annisten [place]-scale sonography with compression, as well as color and duplex ultrasound, were performed to evaluate the deep venous system(s) from the level of the common femoral vein through the popliteal and proximal calf veins. COMPARISON:  None available FINDINGS: VENOUS Acutely thrombosed greater saphenous vein at the level of the knee. Acutely thrombosed greater saphenous vein and adjacent varicose vein at the level of the mid thigh. Normal compressibility of the common femoral, superficial femoral, and popliteal veins, as well as the visualized calf veins. Visualized portions of profunda femoral vein are unremarkable. No filling defects to suggest DVT on grayscale or color Doppler imaging. Deep vein doppler waveforms show normal direction of venous flow, normal respiratory plasticity and response to augmentation. Limited views of the contralateral common femoral vein are unremarkable. OTHER None. Limitations: none IMPRESSION: 1. No left lower extremity DVT. 2. Acute superficial venous thrombosis of the greater saphenous at the level of the knee. Acute superficial venous thrombosis of the greater saphenous vein and adjacent varicose vein at the level of the mid thigh. Electronically Signed   By: Acquanetta Belling M.D.   On: 07/06/2023 11:16    Pertinent labs & imaging results that were  available during my care of the  patient were reviewed by me and considered in my medical decision making (see MDM for details).  Medications Ordered in ED Medications  ibuprofen (ADVIL) tablet 800 mg (has no administration in time range)  oxyCODONE (Oxy IR/ROXICODONE) immediate release tablet 5 mg (has no administration in time range)  acetaminophen (TYLENOL) tablet 1,000 mg (has no administration in time range)                                                                                                                                     Procedures Procedures  (including critical care time)  Medical Decision Making / ED Course    Medical Decision Making:    Primrose A Pinheiro is a 77 y.o. female with past medical history as below, significant for anxiety, bipolar disorder, CKD, COPD, IDA, obesity, arthritis who presents to the ED with complaint of bilateral knee pain. The complaint involves an extensive differential diagnosis and also carries with it a high risk of complications and morbidity.  Serious etiology was considered. Ddx includes but is not limited to: Osteoarthritis, fracture, sprain, strain, dislocation, DVT, SVT, etc.  Complete initial physical exam performed, notably the patient was in no acute distress, sitting in stretcher comfortably.    Reviewed and confirmed nursing documentation for past medical history, family history, social history.  Vital signs reviewed.        Brief summary: 77 year old female here with bilateral knee pain, left worse than right with distal left LE swelling.  X-ray with apparent chronic arthritic changes.  Duplex left lower extremity with differential venous thrombosis of saphenous vein at knee and mid thigh.  No evidence of DVT.  No respiratory complaints.  There is some erythema, warmth at the affected areas.  No drainage.  Will cover for possible concurrent thrombophlebitis, analgesia for home, advise she follow-up with orthopedics regarding chronic knee pain.  The  patient improved significantly and was discharged in stable condition. Detailed discussions were had with the patient regarding current findings, and need for close f/u with PCP or on call doctor. The patient has been instructed to return immediately if the symptoms worsen in any way for re-evaluation. Patient verbalized understanding and is in agreement with current care plan. All questions answered prior to discharge.         Additional history obtained: -Additional history obtained from family -External records from outside source obtained and reviewed including: Chart review including previous notes, labs, imaging, consultation notes including  Medications, primary care documentation, prior urgent care documentation   Lab Tests: -I ordered, reviewed, and interpreted labs.   The pertinent results include:   Labs Reviewed  GLUCOSE, CAPILLARY    Notable for Lucas stable  EKG   EKG Interpretation Date/Time:    Ventricular Rate:    PR Interval:    QRS Duration:    QT Interval:    QTC Calculation:  R Axis:      Text Interpretation:           Imaging Studies ordered: I ordered imaging studies including x-ray knee bilateral, LE duplex I independently visualized the following imaging with scope of interpretation limited to determining acute life threatening conditions related to emergency care; findings noted above I independently visualized and interpreted imaging. I agree with the radiologist interpretation   Medicines ordered and prescription drug management: Meds ordered this encounter  Medications   DISCONTD: oxyCODONE-acetaminophen (PERCOCET/ROXICET) 5-325 MG per tablet 1 tablet    Refill:  0   ibuprofen (ADVIL) tablet 800 mg   oxyCODONE (Oxy IR/ROXICODONE) immediate release tablet 5 mg    Refill:  0   acetaminophen (TYLENOL) tablet 1,000 mg   oxyCODONE (ROXICODONE) 5 MG immediate release tablet    Sig: Take 0.5-1 tablets (2.5-5 mg total) by mouth every 4  (four) hours as needed for severe pain (pain score 7-10).    Dispense:  10 tablet    Refill:  0   ibuprofen (ADVIL) 600 MG tablet    Sig: Take 1 tablet (600 mg total) by mouth every 6 (six) hours as needed.    Dispense:  30 tablet    Refill:  0   acetaminophen (TYLENOL) 325 MG tablet    Sig: Take 2 tablets (650 mg total) by mouth every 6 (six) hours as needed.    Dispense:  36 tablet    Refill:  0   cephALEXin (KEFLEX) 500 MG capsule    Sig: Take 1 capsule (500 mg total) by mouth 3 (three) times daily for 7 days.    Dispense:  21 capsule    Refill:  0    -I have reviewed the patients home medicines and have made adjustments as needed   Consultations Obtained: na   Cardiac Monitoring: Continuous pulse oximetry interpreted by myself, 97% on RA.    Social Determinants of Health:  Diagnosis or treatment significantly limited by social determinants of health: former smoker and obesity   Reevaluation: After the interventions noted above, I reevaluated the patient and found that they have improved  Co morbidities that complicate the patient evaluation  Past Medical History:  Diagnosis Date   Abdominal pain    around naval   Anxiety    Arthritis    Bipolar disorder (HCC)    Breast pain in female    Bruises easily    Cancer (HCC) 2003   left lung   Cervical disc disease    Chronic kidney disease    COPD (chronic obstructive pulmonary disease) (HCC)    O2 as needed.   Depression    DJD (degenerative joint disease)    Dyspnea    uses O2 via [place] 2-3L PRN   Gum disease    Hernia    Hypoglycemic episode in patient with diabetes mellitus (HCC) 2021   Diet controlled diabetes with occasional hypoglycemia at night   Hypothyroidism    no meds   IDA (iron deficiency anemia) 03/27/2021   in [place]   Insomnia    not currently a problem   Obstructive sleep apnea 08/06/2013   Phlebitis    Pneumonia 03/2022   mild case   Pre-diabetes    diet controlled, no meds   Thyroid  disease    Walker as ambulation aid    Wears glasses    Weight increase       Dispostion: Disposition decision including need for hospitalization was considered, and patient discharged  from emergency department.    Final Clinical Impression(s) / ED Diagnoses Final diagnoses:  Chronic pain of both knees  Acute superficial venous thrombosis of lower extremity, left        Sloan Leiter, DO 07/06/23 1343

## 2023-07-07 DIAGNOSIS — M1711 Unilateral primary osteoarthritis, right knee: Secondary | ICD-10-CM | POA: Diagnosis not present

## 2023-07-19 ENCOUNTER — Ambulatory Visit: Payer: BC Managed Care – PPO | Admitting: Podiatry

## 2023-07-19 DIAGNOSIS — R609 Edema, unspecified: Secondary | ICD-10-CM | POA: Diagnosis not present

## 2023-07-19 DIAGNOSIS — M79606 Pain in leg, unspecified: Secondary | ICD-10-CM | POA: Diagnosis not present

## 2023-07-20 ENCOUNTER — Ambulatory Visit (HOSPITAL_COMMUNITY)
Admission: RE | Admit: 2023-07-20 | Discharge: 2023-07-20 | Disposition: A | Discharge status: Home / Self Care | Payer: Medicare PPO | Source: Ambulatory Visit | Attending: Vascular Surgery | Admitting: Vascular Surgery

## 2023-07-20 ENCOUNTER — Other Ambulatory Visit (HOSPITAL_COMMUNITY): Payer: Self-pay | Admitting: Internal Medicine

## 2023-07-20 DIAGNOSIS — M79605 Pain in left leg: Secondary | ICD-10-CM | POA: Diagnosis not present

## 2023-07-20 DIAGNOSIS — M79606 Pain in leg, unspecified: Secondary | ICD-10-CM | POA: Diagnosis not present

## 2023-07-29 DIAGNOSIS — Z23 Encounter for immunization: Secondary | ICD-10-CM | POA: Diagnosis not present

## 2023-07-29 DIAGNOSIS — M51361 Other intervertebral disc degeneration, lumbar region with lower extremity pain only: Secondary | ICD-10-CM | POA: Diagnosis not present

## 2023-07-29 DIAGNOSIS — E1165 Type 2 diabetes mellitus with hyperglycemia: Secondary | ICD-10-CM | POA: Diagnosis not present

## 2023-07-29 DIAGNOSIS — I82890 Acute embolism and thrombosis of other specified veins: Secondary | ICD-10-CM | POA: Diagnosis not present

## 2023-08-02 DIAGNOSIS — R531 Weakness: Secondary | ICD-10-CM | POA: Diagnosis not present

## 2023-08-02 DIAGNOSIS — M1711 Unilateral primary osteoarthritis, right knee: Secondary | ICD-10-CM | POA: Diagnosis not present

## 2023-08-02 DIAGNOSIS — M47816 Spondylosis without myelopathy or radiculopathy, lumbar region: Secondary | ICD-10-CM | POA: Diagnosis not present

## 2023-08-04 DIAGNOSIS — J449 Chronic obstructive pulmonary disease, unspecified: Secondary | ICD-10-CM | POA: Diagnosis not present

## 2023-08-04 DIAGNOSIS — J9611 Chronic respiratory failure with hypoxia: Secondary | ICD-10-CM | POA: Diagnosis not present

## 2023-08-09 ENCOUNTER — Ambulatory Visit: Payer: BC Managed Care – PPO | Admitting: Podiatry

## 2023-08-17 DIAGNOSIS — M170 Bilateral primary osteoarthritis of knee: Secondary | ICD-10-CM | POA: Diagnosis not present

## 2023-08-24 ENCOUNTER — Ambulatory Visit: Payer: Medicare PPO | Admitting: Cardiology

## 2023-08-25 DIAGNOSIS — M170 Bilateral primary osteoarthritis of knee: Secondary | ICD-10-CM | POA: Diagnosis not present

## 2023-08-27 ENCOUNTER — Telehealth: Payer: Self-pay

## 2023-08-27 NOTE — Progress Notes (Signed)
 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 FEV1 1.75/87%, FEV1/FVC 0.53, DLCO 49 Office Spirometry 07/04/2015-mild airway obstruction. FEV1/FVC 0.55 Office Spirometry 03/06/2016- mild obstructive airways disease-FVC 2.01/104%, FEV1 1.16/78%, ratio 0.58, FEF 25-75% 0.61/ 43% NPSG 07/03/13- AHI 5.3/ hr, weight 234 pounds PFT 05/03/17-mild obstructive airways disease, no response to bronchodilator.  Diffusion moderately reduced.  FEV1/FVC 0.59, DLCO 59% Walk Test O2 qualifying 06/05/21- desaturated to 87%, improving to 94% on pulse 2L ----------------------------------------------------------------------------------------------------------- .  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 Minimal occasional cough now with clear sputum.  Feels at baseline.  Asked to change from Trelegy to Breztri  although she is not sure how much difference either makes.  We discussed this. -She is clear from pulmonary standpoint for cervical spine surgery, but has oxygen  dependent COPD and history of left lower lobectomy. CXR 07/27/22-  IMPRESSION: 1. No acute cardiopulmonary process. 2. Apical emphysematous change.  08/30/23-77  female former smoker followed for  COPD GOLD III complicated by abdominal pain, Anxiety, Arthritis, Bipolar  disorder (HCC), Breast pain in female, Bruises easily, Cancer (HCC) (2003), Cervical disc disease, Chronic kidney disease, COPD (chronic obstructive pulmonary disease) (HCC), Depression, DJD (degenerative joint disease), Dyspnea, Gum disease, Hernia, Hypoglycemic episode in patient with diabetes mellitus (HCC) (2021), Hypothyroidism, IDA (iron deficiency anemia) (03/27/2021), Insomnia, Obstructive sleep apnea (08/06/2013), Phlebitis, Pneumonia (03/2022), Pre-diabetes, Thyroid  disease, Walker as ambulation aid,  O2 2L/ [employer]   -Neb albuterol , Trelegy 100 , Ventolin  hfa,    Last seen in November by Dr Waylan Haggard for R Lobar Pneumonia.  Discussed the use of AI scribe software for clinical note transcription with the patient, who gave verbal consent to proceed. History of Present Illness   The patient, with a history of pneumonia in November, reports feeling well overall, but has difficulty walking. She uses oxygen  at home for sleep and during the day as needed, checking her oxygen  levels periodically. If her oxygen  level drops below ninety-two, she uses supplemental oxygen . She reports using oxygen  more frequently than in the past. She also uses a nebulizer machine, Trelegy inhaler, and albuterol  inhaler for her respiratory issues. She reports minimal coughing and phlegm production, mostly in the morning. She had a follow-up chest x-ray in January that was clear.                                                                          //NEEDS CXR Next Visit// ROS-see HPI + = positive Constitutional:   No-   weight loss, night sweats, fevers, chills,+ fatigue, lassitude. HEENT:   No-  headaches, +difficulty swallowing, tooth/dental problems, sore throat,  No-  sneezing, itching, ear ache, +nasal congestion, post nasal drip,  CV:  No-   chest pain, orthopnea, PND, swelling in lower extremities, anasarca, dizziness, palpitations Resp: +  shortness of breath with exertion or at rest.        productive  cough,  non-productive cough,   coughing up of blood.              No-   change in color of mucus. wheezing.   Skin: No-   rash or lesions. GI:  No-   heartburn, indigestion, abdominal pain, nausea, vomiting, GU: . MS:  No-   joint pain or swelling.   cramping myalgias Neuro-     nothing unusual Psych:  No- change in mood or affect. No depression or anxiety.  No memory loss.  OBJ General- Alert, Oriented, Affect-appropriate, Distress- none acute, +obese +POC here, not using. Skin- rash-none, lesions- none, excoriation- none.  Lymphadenopathy- none Head- atraumatic            Eyes- Gross vision intact, PERRLA, conjunctivae clear secretions            Ears- Hearing, canals-normal            Nose- Clear, no-Septal dev, mucus, polyps, erosion, perforation             Throat- Mallampati III-IV , mucosa , drainage- none, tonsils- atrophic Neck- flexible , trachea midline, +no stridor , thyroid  nl, carotid no bruit Chest - symmetrical excursion , unlabored           Heart/CV- RRR , no murmur , no gallop  , no rub, nl s1 s2                           - JVD- none , edema- none, stasis changes- none, varices- none           Lung- clear+, wheeze- none, cough-none , dullness-none, rub- none           Chest wall- scar left chest Port-A-Cath Abd-  Br/ Gen/ Rectal- Not done, not indicated Extrem- cyanosis- none, clubbing, none, atrophy- none, strength- nl Neuro- grossly intact to observation   Assessment and Plan    Chronic Obstructive Pulmonary Disease (COPD) Stable with home oxygen  use for sleep and occasionally during the day when oxygen  saturation drops below 92%. Minimal productive cough. Using nebulizer, Trelegy inhaler, and albuterol  inhaler. No current need for medication refills. -Continue current medications and oxygen  use as needed. -Encouraged to avoid exposure to individuals with respiratory infections.  Pneumonia (resolved) Had pneumonia in November, but follow-up chest x-ray in  January showed resolution. -No changes to current treatment plan.  General Health Maintenance -Continue to monitor oxygen  saturation and adjust oxygen  use as needed. -Contact office or pharmacy for medication refills as needed.     CXR next visit

## 2023-08-30 ENCOUNTER — Encounter: Payer: Self-pay | Admitting: Internal Medicine

## 2023-08-30 ENCOUNTER — Ambulatory Visit (INDEPENDENT_AMBULATORY_CARE_PROVIDER_SITE_OTHER): Payer: Medicare PPO | Admitting: Internal Medicine

## 2023-08-30 VITALS — BP 140/84 | HR 68 | Ht 60.0 in | Wt 203.8 lb

## 2023-08-30 DIAGNOSIS — J449 Chronic obstructive pulmonary disease, unspecified: Secondary | ICD-10-CM

## 2023-08-30 DIAGNOSIS — J9611 Chronic respiratory failure with hypoxia: Secondary | ICD-10-CM

## 2023-08-30 NOTE — Patient Instructions (Signed)
 We can continue Oxygen  at 2 liters, and the current medicines.

## 2023-09-01 ENCOUNTER — Ambulatory Visit: Payer: BC Managed Care – PPO | Admitting: Podiatry

## 2023-09-01 ENCOUNTER — Ambulatory Visit (INDEPENDENT_AMBULATORY_CARE_PROVIDER_SITE_OTHER): Payer: Medicare PPO | Admitting: Podiatry

## 2023-09-01 DIAGNOSIS — B351 Tinea unguium: Secondary | ICD-10-CM

## 2023-09-01 DIAGNOSIS — M79676 Pain in unspecified toe(s): Secondary | ICD-10-CM

## 2023-09-01 DIAGNOSIS — E1151 Type 2 diabetes mellitus with diabetic peripheral angiopathy without gangrene: Secondary | ICD-10-CM

## 2023-09-01 DIAGNOSIS — M170 Bilateral primary osteoarthritis of knee: Secondary | ICD-10-CM | POA: Diagnosis not present

## 2023-09-01 NOTE — Progress Notes (Signed)
  Subjective:  Patient ID: [name], female    DOB: [birth year],   MRN: [id number]  No chief complaint on file.   78 y.o. female presents for concern of thickened elongated and painful nails that are difficult to trim. Requesting to have them trimmed today. Relates burning and tingling in their feet. Patient is diabetic and last A1c was  Lab Results  Component Value Date   HGBA1C 6.4 (H) 05/31/2020   .   PCP:  Delma Officer, PA   .  Denies any other pedal complaints. Denies n/v/f/c.   PCP Renford Dills MD   Past Medical History:  Diagnosis Date   Abdominal pain    around naval   Anxiety    Arthritis    Bipolar disorder [facility])    Breast pain in female    Bruises easily    Cancer (HCC) 2003   left lung   Cervical disc disease    Chronic kidney disease    COPD (chronic obstructive pulmonary disease) (HCC)    O2 as needed.   Depression    DJD (degenerative joint disease)    Dyspnea    uses O2 via [place] 2-3L PRN   Gum disease    Hernia    Hypoglycemic episode in patient with diabetes mellitus (HCC) 2021   Diet controlled diabetes with occasional hypoglycemia at night   Hypothyroidism    no meds   IDA (iron deficiency anemia) 03/27/2021   in [place]   Insomnia    not currently a problem   Obstructive sleep apnea 08/06/2013   Phlebitis    Pneumonia 03/2022   mild case   Pre-diabetes    diet controlled, no meds   Thyroid disease    Walker as ambulation aid    Wears glasses    Weight increase     Objective:  Physical Exam: Vascular: DP/PT pulses 2/4 bilateral. CFT <3 seconds. Normal hair growth on digits. No edema.  Skin. No lacerations or abrasions bilateral feet. Nails 1-5 are thickened discolored and elongated with subungual debris.   Musculoskeletal: MMT 5/5 bilateral lower extremities in DF, PF, Inversion and Eversion. Deceased ROM in DF of ankle joint.  Neurological: Sensation intact to light touch.   Assessment:   1. Pain due to onychomycosis of  toenail   2. Type 2 diabetes mellitus with peripheral vascular disease (HCC)          Plan:  Patient was evaluated and treated and all questions answered. -Discussed and educated patient on diabetic foot care, especially with  regards to the vascular, neurological and musculoskeletal systems.  -Stressed the importance of good glycemic control and the detriment of not  controlling glucose levels in relation to the foot. -Discussed supportive shoes at all times and checking feet regularly.  -Mechanically debrided all nails 1-5 bilateral using sterile nail nipper and filed with dremel without incident  -Answered all patient questions -Patient to return  in 3 months for at risk foot care -Patient advised to call the office if any problems or questions arise in the meantime.   Louann Sjogren, DPM

## 2023-09-03 DIAGNOSIS — M47816 Spondylosis without myelopathy or radiculopathy, lumbar region: Secondary | ICD-10-CM | POA: Diagnosis not present

## 2023-09-04 DIAGNOSIS — J449 Chronic obstructive pulmonary disease, unspecified: Secondary | ICD-10-CM | POA: Diagnosis not present

## 2023-09-04 DIAGNOSIS — J9611 Chronic respiratory failure with hypoxia: Secondary | ICD-10-CM | POA: Diagnosis not present

## 2023-09-14 ENCOUNTER — Ambulatory Visit: Payer: Medicare PPO | Attending: Cardiology | Admitting: Cardiology

## 2023-09-14 VITALS — BP 112/72 | HR 85 | Resp 16 | Ht 60.0 in | Wt 202.0 lb

## 2023-09-14 DIAGNOSIS — Z87898 Personal history of other specified conditions: Secondary | ICD-10-CM

## 2023-09-14 DIAGNOSIS — Z760 Encounter for issue of repeat prescription: Secondary | ICD-10-CM | POA: Diagnosis not present

## 2023-09-14 DIAGNOSIS — Z85118 Personal history of other malignant neoplasm of bronchus and lung: Secondary | ICD-10-CM

## 2023-09-14 DIAGNOSIS — I10 Essential (primary) hypertension: Secondary | ICD-10-CM | POA: Diagnosis not present

## 2023-09-14 DIAGNOSIS — E782 Mixed hyperlipidemia: Secondary | ICD-10-CM

## 2023-09-14 DIAGNOSIS — R072 Precordial pain: Secondary | ICD-10-CM

## 2023-09-14 MED ORDER — NITROGLYCERIN 0.4 MG SL SUBL: 0.4000 mg | SUBLINGUAL_TABLET | SUBLINGUAL | 0 refills | Status: AC | PRN

## 2023-09-14 NOTE — Patient Instructions (Signed)
 Medication Instructions:  Your physician recommends that you continue on your current medications as directed. Please refer to the Current Medication list given to you today.  Refill of nitroglycerin placed and sent to pharmacy.   *If you need a refill on your cardiac medications before your next appointment, please call your pharmacy*  Lab Work: None ordered today. If you have labs (blood work) drawn today and your tests are completely normal, you will receive your results only by: MyChart Message (if you have MyChart) OR A paper copy in the mail If you have any lab test that is abnormal or we need to change your treatment, we will call you to review the results.  Testing/Procedures: None ordered today.  Follow-Up: At [facility], you and your health needs are our priority.  As part of our continuing mission to provide you with exceptional heart care, we have created designated Provider Care Teams.  These Care Teams include your primary Cardiologist (physician) and Advanced Practice Providers (APPs -  Physician Assistants and Nurse Practitioners) who all work together to provide you with the care you need, when you need it.  We recommend signing up for the patient portal called "MyChart".  Sign up information is provided on this After Visit Summary.  MyChart is used to connect with patients for Virtual Visits (Telemedicine).  Patients are able to view lab/test results, encounter notes, upcoming appointments, etc.  Non-urgent messages can be sent to your provider as well.   To learn more about what you can do with MyChart, go to [email or web address].    Your next appointment:   1 year(s)  The format for your next appointment:   In Person  Provider:   Tessa Lerner, DO {

## 2023-09-14 NOTE — Progress Notes (Signed)
 Cardiology Office Note:  .    ID:  [name], DOB 1946-03-03, MRN [id number] PCP:  Delma Officer, PA  Former Cardiology Providers: NA [facility] Providers Cardiologist:  Tessa Lerner, DO , [facility] (established care 11/11/2021) Electrophysiologist:  None  Click to update primary MD,subspecialty MD or APP then REFRESH:1}    Chief Complaint  Patient presents with   Follow-up    1 year follow-up    History of Present Illness: .   [name] is a 78 y.o. African-American female whose past medical history and cardiovascular risk factors includes: hx of lung cancer s/p chemotherapy, emphysema/COPD on supplemental oxygen, prediabetes, hypertension, hyperlipidemia, obesity due to excess calories, history of gastric banding/gastric bypass surgery.   Patient presents today for almost 1 year follow-up visit.  During her last office visit in April 2024 she was experiencing chest pain which was felt to be noncardiac based on symptoms.  She had a prior ischemic workup which illustrated preserved LVEF and a total coronary calcium score of 0 and a nuclear stress test which was low risk.  However due to the acute visit and concerns she underwent a repeat echocardiogram which read illustrated preserved LVEF without regional wall motion normalities.  Since then she has done well and she is not complaining of any chest pain or heart failure symptoms.  She still has shortness of breath which is chronic and stable and she now uses oxygen on as needed basis.  She was started on Mounjaro over the last 10 weeks and has lost approximately 36 pounds.   Review of Systems: .   Review of Systems  Cardiovascular:  Positive for dyspnea on exertion (chronic). Negative for chest pain, claudication, irregular heartbeat, leg swelling, near-syncope, orthopnea, palpitations, paroxysmal nocturnal dyspnea and syncope.  Respiratory:  Negative for shortness of breath.   Hematologic/Lymphatic:  Negative for bleeding problem.  Musculoskeletal:  Positive for joint pain.    Studies Reviewed:   EKG: EKG Interpretation Date/Time:  Tuesday September 14 2023 13:34:18 EST Ventricular Rate:  67 PR Interval:  168 QRS Duration:  102 QT Interval:  406 QTC Calculation: 429 R Axis:   -71  Text Interpretation: Normal sinus rhythm Left anterior fascicular block Minimal voltage criteria for LVH, may be normal variant ( Cornell product ) Nonspecific T wave abnormality When compared with ECG of 31-May-2020 10:59, No significant change was found Confirmed by Tessa Lerner [phone]) on 09/14/2023 1:44:44 PM  Echocardiogram: 11/04/2022:  Normal LV systolic function with visual EF 60-65%. Left ventricle cavity is normal in size. Normal left ventricular wall thickness. Normal global wall motion. Normal diastolic filling pattern, normal LAP. Calculated EF 69%. Structurally normal tricuspid valve with trace regurgitation. No evidence of pulmonary hypertension. No significant change compared to 04/2022.   Stress Testing: Lexiscan Tetrofosmin stress test 12/08/2021: Lexiscan nuclear stress test performed using 1-day protocol. Normal myocardial perfusion. Stress LVEF 55%. Low risk study.   Heart Catheterization: 05/02/2012: Right coronary artery: The vessel is smooth, normal, Very small with a very tiny PD branch. It is Co- Dominant. Normal   Left main coronary artery is large and normal.   Circumflex coronary artery: A large vessel giving origin to a large obtuse marginal 1. It is dominant. Normal   LAD:  LAD gives origin to a large diagonal-1. LAD Ends before reaching the apex. Normal   Ramus intermediate: It is a very large branch. It is smooth and normal.   LV gram: 55-60%  Coronary calcium score:  12/10/2021  No visible coronary artery calcifications. Total coronary calcium score of 0. No acute extra cardiac abnormality.  RADIOLOGY: NA   Labs:       Latest Ref Rng & Units 05/28/2022     2:23 PM 10/30/2020   10:28 AM 10/30/2020   10:21 AM  CBC  WBC 4.0 - 10.5 K/uL 6.0  4.4  CANCELED   Hemoglobin 12.0 - 15.0 g/dL 40.9  81.1    Hematocrit 36.0 - 46.0 % 49.6  44.3    Platelets 150 - 400 K/uL 187  197.0         Latest Ref Rng & Units 11/09/2022   11:21 AM 05/12/2022    4:08 PM 03/31/2022    3:13 PM  BMP  Glucose 70 - 99 mg/dL 914  76  86   BUN 8 - 27 mg/dL 31  17  30    Creatinine 0.57 - 1.00 mg/dL 7.82  9.56  2.13   BUN/Creat Ratio 12 - 28 27  13     Sodium 134 - 144 mmol/L 143  145  144   Potassium 3.5 - 5.2 mmol/L 5.0  4.7  4.2   Chloride 96 - 106 mmol/L 103  103  106   CO2 20 - 29 mmol/L 26  26  29    Calcium 8.7 - 10.3 mg/dL 9.3  9.6  9.3       Latest Ref Rng & Units 11/09/2022   11:21 AM 05/12/2022    4:08 PM 03/31/2022    3:13 PM  CMP  Glucose 70 - 99 mg/dL 086  76  86   BUN 8 - 27 mg/dL 31  17  30    Creatinine 0.57 - 1.00 mg/dL 5.78  4.69  6.29   Sodium 134 - 144 mmol/L 143  145  144   Potassium 3.5 - 5.2 mmol/L 5.0  4.7  4.2   Chloride 96 - 106 mmol/L 103  103  106   CO2 20 - 29 mmol/L 26  26  29    Calcium 8.7 - 10.3 mg/dL 9.3  9.6  9.3   Total Protein 6.0 - 8.5 g/dL 6.4  7.2    Total Bilirubin 0.0 - 1.2 mg/dL 0.4  0.3    Alkaline Phos 44 - 121 IU/L 72  77    AST 0 - 40 IU/L 24  26    ALT 0 - 32 IU/L 15  16      Lab Results  Component Value Date   CHOL 163 11/09/2022   HDL 64 11/09/2022   LDLCALC 77 11/09/2022   LDLDIRECT 72 11/09/2022   TRIG 127 11/09/2022   CHOLHDL 2.6 05/01/2012   No results for input(s): "LIPOA" in the last 8760 hours. No components found for: "NTPROBNP" No results for input(s): "PROBNP" in the last 8760 hours. No results for input(s): "TSH" in the last 8760 hours.  External Labs: Collected: 11/14/2020. Total cholesterol 204, triglycerides 93, HDL 59, LDL 128. Hemoglobin A1c 6.4%   Collected: October 29, 2021 provided by the patient. Hemoglobin 15.1 g/dL, hematocrit 52.8%. BUN 20, creatinine 1.15. eGFR 49. Sodium  145, potassium 4.8, chloride 105, bicarb 25, AST 27, ALT 16. Total cholesterol 224, triglycerides 114, HDL 69, LDL 140. A1c 6.3  External Labs: Collected: November 09, 2022 from [facility] database  Total cholesterol 163, HDL 64, LDL 72, triglycerides 127. Serum creatinine 1.15. Potassium 5  Physical Exam:    Today's Vitals   09/14/23 1330  BP: 112/72  Pulse: 85  Resp: 16  SpO2: 95%  Weight: 202 lb (91.6 kg)  Height: 5' (1.524 m)   Body mass index is 39.45 kg/m. Wt Readings from Last 3 Encounters:  09/14/23 202 lb (91.6 kg)  08/30/23 203 lb 12.8 oz (92.4 kg)  07/06/23 223 lb 12.3 oz (101.5 kg)    Physical Exam  Constitutional: No distress.  hemodynamically stable  Neck: No JVD present.  Cardiovascular: Normal rate, regular rhythm, S1 normal and S2 normal. Exam reveals no gallop, no S3 and no S4.  No murmur heard. Pulmonary/Chest: Effort normal and breath sounds normal. No stridor. She has no wheezes. She has no rales.  Musculoskeletal:        General: No edema.     Cervical back: Neck supple.  Skin: Skin is warm.   Impression & Recommendation(s):  Impression:   ICD-10-CM   1. Benign hypertension  I10 EKG 12-Lead    2. Mixed hyperlipidemia  E78.2     3. Medication refill  Z76.0 nitroGLYCERIN (NITROSTAT) 0.4 MG SL tablet       Recommendation(s):  Benign hypertension Office blood pressures are well-controlled. Continue Lasix 20 mg as needed. Reemphasized importance of low-salt diet. Cardiology following peripherally, currently managed by PCP  Mixed hyperlipidemia Prior LDL levels were as high as 140 mg/dL. Currently on Mounjaro. Outside labs from April 2024 via [facility] database reviewed.  LDL 72 mg/dL Cardiology is following peripherally.  Medication refill Refill sublingual nitroglycerin tablets.  Since last office visit she has not had any reoccurrence of precordial pain.  She is working on improving her modifiable cardiovascular risk factors.  With pharmacological  therapy she has lost weight since last office visit and she is congratulated on her efforts.  No further cardiovascular testing warranted at this time.   Discussed management of at least 2 chronic comorbid conditions, EKG ordered and independently reviewed, outside labs from [facility] database from 2024 independently reviewed as stated above, and prescription drug management.  Orders Placed:  Orders Placed This Encounter  Procedures   EKG 12-Lead   Final Medication List:    Meds ordered this encounter  Medications   nitroGLYCERIN (NITROSTAT) 0.4 MG SL tablet    Sig: Place 1 tablet (0.4 mg total) under the tongue every 5 (five) minutes as needed for chest pain. If you require more than two tablets five minutes apart go to the nearest ER via EMS.    Dispense:  30 tablet    Refill:  0    Medications Discontinued During This Encounter  Medication Reason   nitroGLYCERIN (NITROSTAT) 0.4 MG SL tablet Reorder     Current Outpatient Medications:    acetaminophen (TYLENOL) 325 MG tablet, Take 2 tablets (650 mg total) by mouth every 6 (six) hours as needed., Disp: 100 tablet, Rfl: 0   albuterol (PROVENTIL) (2.5 MG/3ML) 0.083% nebulizer solution, Take 3 mLs (2.5 mg total) by nebulization every 8 (eight) hours as needed for wheezing or shortness of breath. USE 1 VIAL IN NEBULIZER EVERY 8 HOURS AS NEEDED FOR WHEEZING OR SHORTNESS OF BREATH, Disp: 375 mL, Rfl: 0   albuterol (VENTOLIN HFA) 108 (90 Base) MCG/ACT inhaler, Inhale 2 puffs into the lungs every 4 (four) hours as needed for wheezing or shortness of breath., Disp: 8.5 each, Rfl: 5   cetirizine (ZYRTEC) 10 MG tablet, Take 10 mg by mouth daily., Disp: , Rfl:    [employer] ASPIRIN LOW DOSE 81 MG tablet, TAKE 1 TABLET (81 MG TOTAL) BY MOUTH DAILY. SWALLOW WHOLE., Disp: 90 tablet,  Rfl: 3   diphenhydrAMINE (BENADRYL) 12.5 MG/5ML liquid, 25 mg every 6 (six) hours as needed for allergies or itching., Disp: , Rfl:    FREESTYLE INSULINX TEST test strip, AS DIRECTED  TO OBTAIN BLOOD TWICE A DAY AS NEEDED DX CODE E11.69 90 DAYS, Disp: , Rfl:    furosemide (LASIX) 20 MG tablet, Take 20 mg by mouth daily as needed for edema or fluid., Disp: , Rfl:    gabapentin (NEURONTIN) 100 MG capsule, Take 3 capsules (300 mg total) by mouth 3 (three) times daily., Disp: 90 capsule, Rfl: 1   glucose 4 GM chewable tablet, Chew 1-3 tablets by mouth as needed for low blood sugar., Disp: , Rfl:    ketoconazole (NIZORAL) 2 % cream, Apply 1 Application topically daily as needed for irritation., Disp: 60 g, Rfl: 2   Lancets (FREESTYLE) lancets, , Disp: , Rfl:    lidocaine (LIDODERM) 5 %, Place 1 patch onto the skin daily. Remove & Discard patch within 12 hours or as directed by MD, Disp: 30 patch, Rfl: 0   Nebulizers (COMPRESSOR/NEBULIZER) MISC, Use as directed, Disp: 1 each, Rfl: prn   Polyethyl Glycol-Propyl Glycol (SYSTANE OP), Place 1 drop into both eyes 3 (three) times daily as needed (dry eyes)., Disp: , Rfl:    promethazine-dextromethorphan (PROMETHAZINE-DM) 6.25-15 MG/5ML syrup, Take 5 mLs by mouth 3 (three) times daily as needed for cough., Disp: 200 mL, Rfl: 0   Tiotropium Bromide-Olodaterol (STIOLTO RESPIMAT) 2.5-2.5 MCG/ACT AERS, Inhale 2 puffs into the lungs daily., Disp: 1 each, Rfl: 12   tirzepatide (MOUNJARO) 5 MG/0.5ML Pen, Inject 5 mg into the skin once a week., Disp: , Rfl:    trolamine salicylate (ASPERCREME) 10 % cream, Apply 1 Application topically as needed for muscle pain., Disp: , Rfl:    ibuprofen (ADVIL) 600 MG tablet, Take 1 tablet (600 mg total) by mouth every 6 (six) hours as needed. (Patient not taking: Reported on 09/14/2023), Disp: 30 tablet, Rfl: 0   nitroGLYCERIN (NITROSTAT) 0.4 MG SL tablet, Place 1 tablet (0.4 mg total) under the tongue every 5 (five) minutes as needed for chest pain. If you require more than two tablets five minutes apart go to the nearest ER via EMS., Disp: 30 tablet, Rfl: 0  Consent:   NA  Disposition:   1 year follow-up sooner  if needed  Patient may be asked to follow-up sooner based on the results of the above-mentioned testing.  Her questions and concerns were addressed to her satisfaction. She voices understanding of the recommendations provided during this encounter.    Signed, Tessa Lerner, DO, [facility]  [facility]  [address] [place], [place] [address]

## 2023-09-17 DIAGNOSIS — M47816 Spondylosis without myelopathy or radiculopathy, lumbar region: Secondary | ICD-10-CM | POA: Diagnosis not present

## 2023-09-17 DIAGNOSIS — E119 Type 2 diabetes mellitus without complications: Secondary | ICD-10-CM | POA: Diagnosis not present

## 2023-09-19 ENCOUNTER — Encounter: Payer: Self-pay | Admitting: Cardiology

## 2023-09-27 DIAGNOSIS — E785 Hyperlipidemia, unspecified: Secondary | ICD-10-CM | POA: Diagnosis not present

## 2023-09-27 DIAGNOSIS — J9610 Chronic respiratory failure, unspecified whether with hypoxia or hypercapnia: Secondary | ICD-10-CM | POA: Diagnosis not present

## 2023-09-27 DIAGNOSIS — M1990 Unspecified osteoarthritis, unspecified site: Secondary | ICD-10-CM | POA: Diagnosis not present

## 2023-09-27 DIAGNOSIS — Z7982 Long term (current) use of aspirin: Secondary | ICD-10-CM | POA: Diagnosis not present

## 2023-09-27 DIAGNOSIS — K219 Gastro-esophageal reflux disease without esophagitis: Secondary | ICD-10-CM | POA: Diagnosis not present

## 2023-09-27 DIAGNOSIS — F325 Major depressive disorder, single episode, in full remission: Secondary | ICD-10-CM | POA: Diagnosis not present

## 2023-09-27 DIAGNOSIS — J439 Emphysema, unspecified: Secondary | ICD-10-CM | POA: Diagnosis not present

## 2023-09-27 DIAGNOSIS — I10 Essential (primary) hypertension: Secondary | ICD-10-CM | POA: Diagnosis not present

## 2023-09-27 DIAGNOSIS — E1151 Type 2 diabetes mellitus with diabetic peripheral angiopathy without gangrene: Secondary | ICD-10-CM | POA: Diagnosis not present

## 2023-09-29 NOTE — Telephone Encounter (Signed)
 08/27/2023 LMFP. If patient is using a CPAP machine, left msg to bring in SD card for mondays visit.

## 2023-10-02 DIAGNOSIS — J449 Chronic obstructive pulmonary disease, unspecified: Secondary | ICD-10-CM | POA: Diagnosis not present

## 2023-10-02 DIAGNOSIS — J9611 Chronic respiratory failure with hypoxia: Secondary | ICD-10-CM | POA: Diagnosis not present

## 2023-10-06 ENCOUNTER — Other Ambulatory Visit: Payer: Self-pay | Admitting: Internal Medicine

## 2023-10-06 NOTE — Telephone Encounter (Signed)
 Please advise if appropriate to continue I see in med list but dont see to continue in last office visit summary only the Trelegy

## 2023-10-07 DIAGNOSIS — M1711 Unilateral primary osteoarthritis, right knee: Secondary | ICD-10-CM | POA: Diagnosis not present

## 2023-10-07 DIAGNOSIS — R350 Frequency of micturition: Secondary | ICD-10-CM | POA: Diagnosis not present

## 2023-10-07 NOTE — Telephone Encounter (Signed)
 I sent refill for Stiolto inhaler. I think the Trelegy was just a sample.

## 2023-10-28 DIAGNOSIS — E119 Type 2 diabetes mellitus without complications: Secondary | ICD-10-CM | POA: Diagnosis not present

## 2023-10-28 DIAGNOSIS — J4489 Other specified chronic obstructive pulmonary disease: Secondary | ICD-10-CM | POA: Diagnosis not present

## 2023-10-28 DIAGNOSIS — M47816 Spondylosis without myelopathy or radiculopathy, lumbar region: Secondary | ICD-10-CM | POA: Diagnosis not present

## 2023-10-28 DIAGNOSIS — G4733 Obstructive sleep apnea (adult) (pediatric): Secondary | ICD-10-CM | POA: Diagnosis not present

## 2023-10-28 DIAGNOSIS — Z85118 Personal history of other malignant neoplasm of bronchus and lung: Secondary | ICD-10-CM | POA: Diagnosis not present

## 2023-10-28 DIAGNOSIS — E559 Vitamin D deficiency, unspecified: Secondary | ICD-10-CM | POA: Diagnosis not present

## 2023-10-28 DIAGNOSIS — E7800 Pure hypercholesterolemia, unspecified: Secondary | ICD-10-CM | POA: Diagnosis not present

## 2023-10-28 DIAGNOSIS — D509 Iron deficiency anemia, unspecified: Secondary | ICD-10-CM | POA: Diagnosis not present

## 2023-11-02 DIAGNOSIS — J449 Chronic obstructive pulmonary disease, unspecified: Secondary | ICD-10-CM | POA: Diagnosis not present

## 2023-11-02 DIAGNOSIS — J9611 Chronic respiratory failure with hypoxia: Secondary | ICD-10-CM | POA: Diagnosis not present

## 2023-11-11 DIAGNOSIS — L299 Pruritus, unspecified: Secondary | ICD-10-CM | POA: Diagnosis not present

## 2023-11-11 DIAGNOSIS — Z85118 Personal history of other malignant neoplasm of bronchus and lung: Secondary | ICD-10-CM | POA: Diagnosis not present

## 2023-11-11 DIAGNOSIS — E7800 Pure hypercholesterolemia, unspecified: Secondary | ICD-10-CM | POA: Diagnosis not present

## 2023-11-11 DIAGNOSIS — R350 Frequency of micturition: Secondary | ICD-10-CM | POA: Diagnosis not present

## 2023-11-11 DIAGNOSIS — E1165 Type 2 diabetes mellitus with hyperglycemia: Secondary | ICD-10-CM | POA: Diagnosis not present

## 2023-11-11 DIAGNOSIS — M1711 Unilateral primary osteoarthritis, right knee: Secondary | ICD-10-CM | POA: Diagnosis not present

## 2023-11-11 DIAGNOSIS — N1831 Chronic kidney disease, stage 3a: Secondary | ICD-10-CM | POA: Diagnosis not present

## 2023-11-11 DIAGNOSIS — J449 Chronic obstructive pulmonary disease, unspecified: Secondary | ICD-10-CM | POA: Diagnosis not present

## 2023-11-29 ENCOUNTER — Ambulatory Visit (INDEPENDENT_AMBULATORY_CARE_PROVIDER_SITE_OTHER): Payer: Medicare PPO | Admitting: Podiatry

## 2023-11-29 DIAGNOSIS — Z91199 Patient's noncompliance with other medical treatment and regimen due to unspecified reason: Secondary | ICD-10-CM

## 2023-11-29 NOTE — Progress Notes (Signed)
 No show

## 2023-11-30 DIAGNOSIS — M461 Sacroiliitis, not elsewhere classified: Secondary | ICD-10-CM | POA: Diagnosis not present

## 2023-11-30 DIAGNOSIS — M47816 Spondylosis without myelopathy or radiculopathy, lumbar region: Secondary | ICD-10-CM | POA: Diagnosis not present

## 2023-11-30 DIAGNOSIS — M1711 Unilateral primary osteoarthritis, right knee: Secondary | ICD-10-CM | POA: Diagnosis not present

## 2023-12-02 DIAGNOSIS — J9611 Chronic respiratory failure with hypoxia: Secondary | ICD-10-CM | POA: Diagnosis not present

## 2023-12-02 DIAGNOSIS — J449 Chronic obstructive pulmonary disease, unspecified: Secondary | ICD-10-CM | POA: Diagnosis not present

## 2024-01-02 DIAGNOSIS — J449 Chronic obstructive pulmonary disease, unspecified: Secondary | ICD-10-CM | POA: Diagnosis not present

## 2024-01-02 DIAGNOSIS — J9611 Chronic respiratory failure with hypoxia: Secondary | ICD-10-CM | POA: Diagnosis not present

## 2024-01-05 ENCOUNTER — Encounter (HOSPITAL_BASED_OUTPATIENT_CLINIC_OR_DEPARTMENT_OTHER): Payer: Self-pay

## 2024-01-14 ENCOUNTER — Other Ambulatory Visit: Payer: Self-pay

## 2024-01-14 MED ORDER — STIOLTO RESPIMAT 2.5-2.5 MCG/ACT IN AERS: 2.0000 | INHALATION_SPRAY | Freq: Every day | RESPIRATORY_TRACT | 0 refills | Status: DC

## 2024-01-14 MED ORDER — ALBUTEROL SULFATE HFA 108 (90 BASE) MCG/ACT IN AERS: 2.0000 | INHALATION_SPRAY | RESPIRATORY_TRACT | 1 refills | Status: DC | PRN

## 2024-01-14 NOTE — Telephone Encounter (Signed)
 OK refill Stiolto Respimat  2.5 mg and albuterol  to [facility].  Last OV 08/30/2023.  Per Dr. Neysa refilled Stiolto 10/07/2023 and patient has upcoming OV on 02/28/2024.

## 2024-01-18 ENCOUNTER — Other Ambulatory Visit: Payer: Self-pay | Admitting: Physician Assistant

## 2024-01-18 DIAGNOSIS — Z1231 Encounter for screening mammogram for malignant neoplasm of breast: Secondary | ICD-10-CM

## 2024-01-20 DIAGNOSIS — H04123 Dry eye syndrome of bilateral lacrimal glands: Secondary | ICD-10-CM | POA: Diagnosis not present

## 2024-01-20 DIAGNOSIS — H43392 Other vitreous opacities, left eye: Secondary | ICD-10-CM | POA: Diagnosis not present

## 2024-01-20 DIAGNOSIS — H40013 Open angle with borderline findings, low risk, bilateral: Secondary | ICD-10-CM | POA: Diagnosis not present

## 2024-01-20 DIAGNOSIS — E119 Type 2 diabetes mellitus without complications: Secondary | ICD-10-CM | POA: Diagnosis not present

## 2024-01-20 DIAGNOSIS — Z961 Presence of intraocular lens: Secondary | ICD-10-CM | POA: Diagnosis not present

## 2024-01-20 DIAGNOSIS — H18513 Endothelial corneal dystrophy, bilateral: Secondary | ICD-10-CM | POA: Diagnosis not present

## 2024-01-20 DIAGNOSIS — D3132 Benign neoplasm of left choroid: Secondary | ICD-10-CM | POA: Diagnosis not present

## 2024-01-20 DIAGNOSIS — D3131 Benign neoplasm of right choroid: Secondary | ICD-10-CM | POA: Diagnosis not present

## 2024-01-26 DIAGNOSIS — F4542 Pain disorder with related psychological factors: Secondary | ICD-10-CM | POA: Diagnosis not present

## 2024-01-28 ENCOUNTER — Ambulatory Visit
Admission: RE | Admit: 2024-01-28 | Discharge: 2024-01-28 | Disposition: A | Discharge status: Home / Self Care | Source: Ambulatory Visit | Attending: Physician Assistant | Admitting: Physician Assistant

## 2024-01-28 DIAGNOSIS — Z1231 Encounter for screening mammogram for malignant neoplasm of breast: Secondary | ICD-10-CM

## 2024-01-31 DIAGNOSIS — M519 Unspecified thoracic, thoracolumbar and lumbosacral intervertebral disc disorder: Secondary | ICD-10-CM | POA: Diagnosis not present

## 2024-01-31 DIAGNOSIS — G629 Polyneuropathy, unspecified: Secondary | ICD-10-CM | POA: Diagnosis not present

## 2024-02-01 DIAGNOSIS — J9611 Chronic respiratory failure with hypoxia: Secondary | ICD-10-CM | POA: Diagnosis not present

## 2024-02-01 DIAGNOSIS — J449 Chronic obstructive pulmonary disease, unspecified: Secondary | ICD-10-CM | POA: Diagnosis not present

## 2024-02-07 ENCOUNTER — Ambulatory Visit: Admission: EM | Admit: 2024-02-07 | Discharge: 2024-02-07 | Disposition: A | Discharge status: Home / Self Care

## 2024-02-07 ENCOUNTER — Ambulatory Visit

## 2024-02-07 DIAGNOSIS — W19XXXA Unspecified fall, initial encounter: Secondary | ICD-10-CM

## 2024-02-07 DIAGNOSIS — R0781 Pleurodynia: Secondary | ICD-10-CM

## 2024-02-07 NOTE — ED Triage Notes (Addendum)
 Pt present with lt side pain when taking deep breaths. States she fell on concrete and landed on her side Saturday. Denies other injuries. Pt is unaware if she lost her balance when she fell. Has been taking Tramadol  with no relief.

## 2024-02-07 NOTE — Discharge Instructions (Signed)
 The radiologist did not see any fractures or concerns on your x-ray today.  You can take Tylenol  as needed for pain relief.  You can try over-the-counter topical creams such as Bengay, IcyHot, or Tiger balm.  You can continue to use heat if that has been helping with your pain.  We recommend following up with your primary care provider within the next 1 to 2 weeks if symptoms are not improving.  Please return or go to the emergency department if you develop any chest pain, shortness of breath, worsening of symptoms, or if you have any other concerns.

## 2024-02-07 NOTE — ED Provider Notes (Signed)
 [facility]    CSN: [id number] Arrival date & time: 02/07/24  1147      History   Chief Complaint Chief Complaint  Patient presents with   Fall    HPI [name] is a 78 y.o. female.   HPI Patient is a 78 year old female who presents to the urgent care today with her daughter for concerns of left-sided rib pain and pain with deep breaths.  She reports that on Saturday, she accidentally fell and landed on her left side.  She denies hitting her head or losing consciousness.  She has been taking some tramadol  and Motrin  to try and help with her symptoms.  She reports that heat has been helping the most.  She denies any chest pain, shortness of breath, hemoptysis, fever, vomiting, or other concerns at this time. Past Medical History:  Diagnosis Date   Abdominal pain    around naval   Anxiety    Arthritis    Bipolar disorder (HCC)    Breast pain in female    Bruises easily    Cancer (HCC) 2003   left lung   Cervical disc disease    Chronic kidney disease    COPD (chronic obstructive pulmonary disease) (HCC)    O2 as needed.   Depression    DJD (degenerative joint disease)    Dyspnea    uses O2 via [place] 2-3L PRN   Gum disease    Hernia    Hypoglycemic episode in patient with diabetes mellitus (HCC) 2021   Diet controlled diabetes with occasional hypoglycemia at night   Hypothyroidism    no meds   IDA (iron deficiency anemia) 03/27/2021   in [place]   Insomnia    not currently a problem   Obstructive sleep apnea 08/06/2013   Phlebitis    Pneumonia 03/2022   mild case   Pre-diabetes    diet controlled, no meds   Thyroid  disease    Walker as ambulation aid    Wears glasses    Weight increase     Patient Active Problem List   Diagnosis Date Noted   Rib pain on left side 11/24/2022   Chronic back pain 11/24/2022   Depression 11/24/2022   Costochondritis 10/30/2022   Impingement syndrome of right shoulder 09/22/2022   S/P cervical spinal fusion  06/21/2022   Cervical stenosis of spine 06/05/2022   Sinusitis, acute maxillary 03/31/2022   Left lower lobe pneumonia 03/31/2022   Change in bowel habit 11/24/2021   Epigastric pain 11/24/2021   Internal hemorrhoids 11/24/2021   History of colonic polyps 11/24/2021   Urticaria 11/24/2021   Spinal stenosis of cervical region 11/05/2021   Pincer nail deformity 09/05/2021   History of diabetes mellitus 03/20/2021   Iron deficiency 03/20/2021   Pruritus 01/09/2021   Pain in left leg 12/23/2020   Prediabetes 11/18/2020   Incarcerated incisional hernia s/p lap repair w mesh 06/06/2020 06/06/2020   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   Polyp of colon 10/23/2019   Chronic respiratory failure with hypoxia (HCC) 04/01/2019   Medication management 02/28/2019   Combined forms of age-related cataract of both eyes 12/01/2018   Early stage nonexudative age-related macular degeneration of both eyes 12/01/2018   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   PVD (posterior vitreous detachment), left 11/28/2018   Dysphagia 12/30/2017   Asthma 04/12/2017   BMI 40.0-44.9, adult (HCC) 04/12/2017   Irritable bowel syndrome 04/12/2017   Osteopenia 04/12/2017   Umbilical hernia without  obstruction and without gangrene 03/18/2017   Hiatal hernia 01/26/2017   Vitamin D deficiency 06/16/2016   Encounter for preoperative examination for general surgical procedure 03/06/2016   Hypercholesterolemia 02/25/2016   Hypertensive disorder 02/25/2016   Cough 08/02/2015   Obstructive sleep apnea 08/06/2013   Neoplasm of breast 07/20/2013   COPD with acute bronchitis (HCC) 06/18/2013   Myalgia 06/18/2013   History of laparoscopic adjustable gastric banding, APS. 09/04/2008. 04/08/2011   History of lung cancer 06/27/2010   SKIN RASH 12/13/2009   DYSPNEA 07/05/2008   DIABETES, TYPE 2 07/01/2008   Obesity, morbid (HCC) 07/01/2008   Type 2 diabetes mellitus without complications (HCC) 07/01/2008   COPD  mixed type (HCC) 06/26/2008   Malignant neoplasm of lung (HCC) 07/20/2001    Past Surgical History:  Procedure Laterality Date   ABDOMINAL HYSTERECTOMY     partial   ANTERIOR CERVICAL DECOMP/DISCECTOMY FUSION N/A 06/05/2022   Procedure: C3-4 ANTERIOR CERVICAL DISCECTOMY FUSION, ALLOGRAFT, PLATE;  Surgeon: Barbarann Oneil BROCKS, MD;  Location: [facility] OR;  Service: Orthopedics;  Laterality: N/A;   CATARACT EXTRACTION  04/2022   FOOT SURGERY     right   HERNIA REPAIR     LAPAROSCOPIC GASTRIC BANDING  08/2008   LAPAROSCOPIC ROUX-EN-Y GASTRIC BYPASS WITH UPPER ENDOSCOPY AND REMOVAL OF LAP BAND  2018   LEFT HEART CATHETERIZATION WITH CORONARY ANGIOGRAM N/A 05/02/2012   Procedure: LEFT HEART CATHETERIZATION WITH CORONARY ANGIOGRAM;  Surgeon: Erick JONELLE Bergamo, MD;  Location: [facility] CATH LAB;  Service: Cardiovascular;  Laterality: N/A;   LUNG LOBECTOMY  2003   left   MASS EXCISION Right 06/06/2020   Procedure: REMOVAL MASS ON RIGHT BUTTOCK;  Surgeon: Sheldon Standing, MD;  Location: [facility] ORS;  Service: General;  Laterality: Right;   SPINE SURGERY     fusion on the neck   THYROIDECTOMY     partial   TONSILLECTOMY     VENTRAL HERNIA REPAIR N/A 06/06/2020   Procedure: LAPAROSCOPIC INCARCERATED INCISIONAL HERNIA REPAIR WITH MESH AND TAP BLOCK;  Surgeon: Sheldon Standing, MD;  Location: [facility];  Service: General;  Laterality: N/A;    OB History   No obstetric history on file.      Home Medications    Prior to Admission medications   Medication Sig Start Date End Date Taking? Authorizing Provider  acetaminophen  (TYLENOL ) 325 MG tablet Take 2 tablets (650 mg total) by mouth every 6 (six) hours as needed. 07/06/23   Elnor Jayson LABOR, DO  albuterol  (PROVENTIL ) (2.5 MG/3ML) 0.083% nebulizer solution Take 3 mLs (2.5 mg total) by nebulization every 8 (eight) hours as needed for wheezing or shortness of breath. USE 1 VIAL IN NEBULIZER EVERY 8 HOURS AS NEEDED FOR WHEEZING OR SHORTNESS OF BREATH 02/23/23   Neysa Reggy BIRCH,  MD  albuterol  (VENTOLIN  HFA) 108 [phone] Base) MCG/ACT inhaler Inhale 2 puffs into the lungs every 4 (four) hours as needed for wheezing or shortness of breath. 01/14/24   Neysa Reggy D, MD  cetirizine (ZYRTEC) 10 MG tablet Take 10 mg by mouth daily.    [provider]  [facility] ASPIRIN  LOW DOSE 81 MG tablet TAKE 1 TABLET (81 MG TOTAL) BY MOUTH DAILY. SWALLOW WHOLE. 11/13/22   Tolia, Sunit, DO  diphenhydrAMINE  (BENADRYL ) 12.5 MG/5ML liquid 25 mg every 6 (six) hours as needed for allergies or itching.    [provider]  FREESTYLE INSULINX TEST test strip AS DIRECTED TO OBTAIN BLOOD TWICE A DAY AS NEEDED DX CODE E11.69 90 DAYS 12/31/20  [provider]  furosemide  (LASIX ) 20 MG tablet Take 20 mg by mouth daily as needed for edema or fluid.    [provider]  gabapentin  (NEURONTIN ) 100 MG capsule Take 3 capsules (300 mg total) by mouth 3 (three) times daily. 06/19/22   Barbarann Oneil BROCKS, MD  glucose 4 GM chewable tablet Chew 1-3 tablets by mouth as needed for low blood sugar.    [provider]  ketoconazole  (NIZORAL ) 2 % cream Apply 1 Application topically daily as needed for irritation. 06/04/22   Scherrie Lamarr SQUIBB, DPM  Lancets (FREESTYLE) lancets  12/31/20   [provider]  lidocaine  (LIDODERM ) 5 % Place 1 patch onto the skin daily. Remove & Discard patch within 12 hours or as directed by MD 10/30/22   Malachy Comer GAILS, NP  Nebulizers (COMPRESSOR/NEBULIZER) MISC Use as directed 06/28/14   Neysa Reggy BIRCH, MD  nitroGLYCERIN  (NITROSTAT ) 0.4 MG SL tablet Place 1 tablet (0.4 mg total) under the tongue every 5 (five) minutes as needed for chest pain. If you require more than two tablets five minutes apart go to the nearest ER via EMS. 09/14/23 10/14/23  Michele, Sunit, DO  Polyethyl Glycol-Propyl Glycol (SYSTANE OP) Place 1 drop into both eyes 3 (three) times daily as needed (dry eyes).    [provider]  promethazine -dextromethorphan (PROMETHAZINE -DM)  6.25-15 MG/5ML syrup Take 5 mLs by mouth 3 (three) times daily as needed for cough. 05/19/23   Christopher Savannah, PA-C  Tiotropium Bromide -Olodaterol (STIOLTO RESPIMAT ) 2.5-2.5 MCG/ACT AERS Inhale 2 puffs into the lungs daily. 01/14/24   Neysa Reggy BIRCH, MD  tirzepatide [facility]) 5 MG/0.5ML Pen Inject 5 mg into the skin once a week.    [provider]  trolamine salicylate (ASPERCREME) 10 % cream Apply 1 Application topically as needed for muscle pain.    [provider]    Family History Family History  Problem Relation Age of Onset   Stroke Mother    Stroke Father     Social History Social History   Tobacco Use   Smoking status: Former    Current packs/day: 0.00    Average packs/day: 2.0 packs/day for 37.0 years (74.0 ttl pk-yrs)    Types: Cigarettes    Start date: 06/21/1965    Quit date: 06/21/2002    Years since quitting: 21.6   Smokeless tobacco: Never  Vaping Use   Vaping status: Never Used  Substance Use Topics   Alcohol  use: Yes    Comment: [employer]isonal Liquor/WIne   Drug use: No     Allergies   Amoxicillin    Review of Systems Review of Systems See HPI for relevant ROS.  Physical Exam Triage Vital Signs ED Triage Vitals  Encounter Vitals Group     BP 02/07/24 1327 109/72     Girls Systolic BP Percentile --      Girls Diastolic BP Percentile --      Boys Systolic BP Percentile --      Boys Diastolic BP Percentile --      Pulse Rate 02/07/24 1327 71     Resp --      Temp 02/07/24 1327 98.2 F (36.8 C)     Temp Source 02/07/24 1327 Oral     SpO2 02/07/24 1327 92 %     Weight --      Height --      Head Circumference --      Peak Flow --      Pain Score 02/07/24 1326 8  Pain Loc --      Pain Education --      Exclude from Growth Chart --    No data found.  Updated Vital Signs BP 109/72 (BP Location: Right Arm)   Pulse 71   Temp 98.2 F (36.8 C) (Oral)   SpO2 92%   Visual Acuity Right Eye Distance:   Left Eye Distance:    Bilateral Distance:    Right Eye Near:   Left Eye Near:    Bilateral Near:     Physical Exam General: Alert and oriented, well-developed/well-nourished, calm, cooperative, no acute distress HEENT: Normocephalic atraumatic, moist mucous membranes, no scleral icterus, trachea midline Lungs: Speaking full sentences, non-labored respirations, no distress, clear to auscultation bilaterally Heart: Regular rate and rhythm Abdomen:  Soft, nondistended Musculoskeletal: Pain to palpation of the left posterior ribs  Neurologic: Awake, A&O x4, gait normal Integumentary: Warm, dry, normal for ethnicity, intact, no rash Psychiatric: Appropriate mood & affect  UC Treatments / Results  Labs (all labs ordered are listed, but only abnormal results are displayed) Labs Reviewed - No data to display  EKG   Radiology DG Ribs Unilateral W/Chest Left Result Date: 02/07/2024 CLINICAL DATA:  Fall, posterior left rib pain. EXAM: LEFT RIBS AND CHEST - 3+ VIEW COMPARISON:  05/19/2023 and CT chest 12/10/2022. FINDINGS: Trachea is midline. Heart size normal. Mild patchy scarring in the right perihilar region. Lungs are emphysematous. Pleuroparenchymal scarring in the lower left hemithorax overall volume loss. No pleural fluid. Old left rib fractures. IMPRESSION: No acute findings. Electronically Signed   By: Newell Eke M.D.   On: 02/07/2024 15:00    Procedures Procedures (including critical care time)  Medications Ordered in UC Medications - No data to display  Initial Impression / Assessment and Plan / UC Course  I have reviewed the triage vital signs and the nursing notes.  Pertinent labs & imaging results that were available during my care of the patient were reviewed by me and considered in my medical decision making (see chart for details).    Presents with rib pain and pain with deep breaths after recent fall.  Differential diagnosis includes: Sprain, fracture, dislocation, contusion,  hematoma, ICH, pneumothorax, ACS, PE, including other diagnoses.  History obtained from: Patient.  Plan at this time/rationale: X-ray of the left ribs.  All ordered tests including imaging and labs were independently reviewed and interpreted by myself. Notable findings: Old fracture noted.  Plan: Patient presents with rib pain after a recent fall.  X-ray was obtained and radiologist reports old left rib fractures with no acute findings.  Discussed over-the-counter options patient can use for symptomatic improvement. We discussed return precautions, symptomatic treatment, and follow up with primary care doctor within 1 week as needed for further evaluation, and the patient demonstrated understanding and agreement with this plan.   Disposition: Stable to discharge home.   All questions answered to the best of this examiner's ability. Advised to f/u with PCP for further eval and/or reassessment. Patient agrees to plan.  An appropriate evaluation has been performed, and in my medical judgment there is currently no evidence of an immediate life-threatening or surgical condition. Discharge is therefore indicated at this time.  This document was created using the aid of voice recognition Scientist, clinical (histocompatibility and immunogenetics).  Final Clinical Impressions(s) / UC Diagnoses   Final diagnoses:  Rib pain on left side  Fall, initial encounter     Discharge Instructions      The radiologist did not see any  fractures or concerns on your x-ray today.  You can take Tylenol  as needed for pain relief.  You can try over-the-counter topical creams such as Bengay, IcyHot, or Tiger balm.  You can continue to use heat if that has been helping with your pain.  We recommend following up with your primary care provider within the next 1 to 2 weeks if symptoms are not improving.  Please return or go to the emergency department if you develop any chest pain, shortness of breath, worsening of symptoms, or if you have any other  concerns.     ED Prescriptions   None    PDMP not reviewed this encounter.   Melonie Locus, PA-C 02/07/24 1520

## 2024-02-11 DIAGNOSIS — M4712 Other spondylosis with myelopathy, cervical region: Secondary | ICD-10-CM | POA: Diagnosis not present

## 2024-02-11 DIAGNOSIS — R531 Weakness: Secondary | ICD-10-CM | POA: Diagnosis not present

## 2024-02-11 DIAGNOSIS — Z6833 Body mass index (BMI) 33.0-33.9, adult: Secondary | ICD-10-CM | POA: Diagnosis not present

## 2024-02-14 ENCOUNTER — Encounter: Admitting: Nurse Practitioner

## 2024-02-15 ENCOUNTER — Other Ambulatory Visit: Payer: Self-pay | Admitting: Neurosurgery

## 2024-02-15 ENCOUNTER — Encounter: Payer: Self-pay | Admitting: Neurosurgery

## 2024-02-15 DIAGNOSIS — M4712 Other spondylosis with myelopathy, cervical region: Secondary | ICD-10-CM

## 2024-02-15 DIAGNOSIS — R531 Weakness: Secondary | ICD-10-CM

## 2024-02-16 ENCOUNTER — Other Ambulatory Visit: Payer: Self-pay | Admitting: Internal Medicine

## 2024-02-17 NOTE — Telephone Encounter (Signed)
 OK refill Stiolto SEE REFILL ENCOUNTER FROM 10/06/2023 FROM DR YOUNG REF SENT IN STIOLTO  Patient has OV on 02/28/2024.  Will ok refill Stiolto x one.

## 2024-02-18 NOTE — Therapy (Unsigned)
 OUTPATIENT PHYSICAL THERAPY CERVICAL EVALUATION   Patient Name: [name] MRN: [id number] DOB:05/11/1946, 78 y.o., female Today's Date: 02/22/2024  END OF SESSION:  PT End of Session - 02/22/24 1241     Visit Number 1    Number of Visits 6    Date for PT Re-Evaluation 04/22/24    Authorization Type [employer] MCR    PT Start Time 1445    PT Stop Time 1530    PT Time Calculation (min) 45 min    Activity Tolerance Patient tolerated treatment well    Behavior During Therapy WFL for tasks assessed/performed          Past Medical History:  Diagnosis Date   Abdominal pain    around naval   Anxiety    Arthritis    Bipolar disorder (HCC)    Breast pain in female    Bruises easily    Cancer (HCC) 2003   left lung   Cervical disc disease    Chronic kidney disease    COPD (chronic obstructive pulmonary disease) (HCC)    O2 as needed.   Depression    DJD (degenerative joint disease)    Dyspnea    uses O2 via [place] 2-3L PRN   Gum disease    Hernia    Hypoglycemic episode in patient with diabetes mellitus (HCC) 2021   Diet controlled diabetes with occasional hypoglycemia at night   Hypothyroidism    no meds   IDA (iron deficiency anemia) 03/27/2021   in [place]   Insomnia    not currently a problem   Obstructive sleep apnea 08/06/2013   Phlebitis    Pneumonia 03/2022   mild case   Pre-diabetes    diet controlled, no meds   Thyroid  disease    Walker as ambulation aid    Wears glasses    Weight increase    Past Surgical History:  Procedure Laterality Date   ABDOMINAL HYSTERECTOMY     partial   ANTERIOR CERVICAL DECOMP/DISCECTOMY FUSION N/A 06/05/2022   Procedure: C3-4 ANTERIOR CERVICAL DISCECTOMY FUSION, ALLOGRAFT, PLATE;  Surgeon: Barbarann Oneil BROCKS, MD;  Location: [facility] OR;  Service: Orthopedics;  Laterality: N/A;   CATARACT EXTRACTION  04/2022   FOOT SURGERY     right   HERNIA REPAIR     LAPAROSCOPIC GASTRIC BANDING  08/2008   LAPAROSCOPIC ROUX-EN-Y GASTRIC BYPASS  WITH UPPER ENDOSCOPY AND REMOVAL OF LAP BAND  2018   LEFT HEART CATHETERIZATION WITH CORONARY ANGIOGRAM N/A 05/02/2012   Procedure: LEFT HEART CATHETERIZATION WITH CORONARY ANGIOGRAM;  Surgeon: Erick JONELLE Bergamo, MD;  Location: [facility] CATH LAB;  Service: Cardiovascular;  Laterality: N/A;   LUNG LOBECTOMY  2003   left   MASS EXCISION Right 06/06/2020   Procedure: REMOVAL MASS ON RIGHT BUTTOCK;  Surgeon: Sheldon Standing, MD;  Location: [facility] ORS;  Service: General;  Laterality: Right;   SPINE SURGERY     fusion on the neck   THYROIDECTOMY     partial   TONSILLECTOMY     VENTRAL HERNIA REPAIR N/A 06/06/2020   Procedure: LAPAROSCOPIC INCARCERATED INCISIONAL HERNIA REPAIR WITH MESH AND TAP BLOCK;  Surgeon: Sheldon Standing, MD;  Location: [facility] ORS;  Service: General;  Laterality: N/A;   Patient Active Problem List   Diagnosis Date Noted   Rib pain on left side 11/24/2022   Chronic back pain 11/24/2022   Depression 11/24/2022   Costochondritis 10/30/2022   Impingement syndrome of right shoulder 09/22/2022   S/P cervical spinal fusion 06/21/2022  Cervical stenosis of spine 06/05/2022   Sinusitis, acute maxillary 03/31/2022   Left lower lobe pneumonia 03/31/2022   Change in bowel habit 11/24/2021   Epigastric pain 11/24/2021   Internal hemorrhoids 11/24/2021   History of colonic polyps 11/24/2021   Urticaria 11/24/2021   Spinal stenosis of cervical region 11/05/2021   Pincer nail deformity 09/05/2021   History of diabetes mellitus 03/20/2021   Iron deficiency 03/20/2021   Pruritus 01/09/2021   Pain in left leg 12/23/2020   Prediabetes 11/18/2020   Incarcerated incisional hernia s/p lap repair w mesh 06/06/2020 06/06/2020   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   Polyp of colon 10/23/2019   Chronic respiratory failure with hypoxia (HCC) 04/01/2019   Medication management 02/28/2019   Combined forms of age-related cataract of both eyes 12/01/2018   Early stage nonexudative age-related  macular degeneration of both eyes 12/01/2018   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   PVD (posterior vitreous detachment), left 11/28/2018   Dysphagia 12/30/2017   Asthma 04/12/2017   BMI 40.0-44.9, adult (HCC) 04/12/2017   Irritable bowel syndrome 04/12/2017   Osteopenia 04/12/2017   Umbilical hernia without obstruction and without gangrene 03/18/2017   Hiatal hernia 01/26/2017   Vitamin D deficiency 06/16/2016   Encounter for preoperative examination for general surgical procedure 03/06/2016   Hypercholesterolemia 02/25/2016   Hypertensive disorder 02/25/2016   Cough 08/02/2015   Obstructive sleep apnea 08/06/2013   Neoplasm of breast 07/20/2013   COPD with acute bronchitis (HCC) 06/18/2013   Myalgia 06/18/2013   History of laparoscopic adjustable gastric banding, APS. 09/04/2008. 04/08/2011   History of lung cancer 06/27/2010   SKIN RASH 12/13/2009   DYSPNEA 07/05/2008   DIABETES, TYPE 2 07/01/2008   Obesity, morbid (HCC) 07/01/2008   Type 2 diabetes mellitus without complications (HCC) 07/01/2008   COPD mixed type (HCC) 06/26/2008   Malignant neoplasm of lung (HCC) 07/20/2001    PCP: Alys Schuyler HERO, PA   REFERRING PROVIDER: Debby Dorn MATSU, MD  REFERRING DIAG: [phone] (ICD-10-CM) - Other spondylosis with myelopathy, cervical region  THERAPY DIAG:  Cervicalgia  Cervical disc disease with myelopathy  Abnormal posture  Rationale for Evaluation and Treatment: Rehabilitation  ONSET DATE: chronic  SUBJECTIVE:                                                                                                                                                                                                         SUBJECTIVE STATEMENT:  Hand dominance: Right  PERTINENT HISTORY:    PAIN:  Are you having pain? Yes:  NPRS scale: 3-4/10 Pain location: neck Pain description: sore Aggravating factors: activity involving  Relieving factors: heat  PRECAUTIONS:  Other: cervical fusion C3-6  RED FLAGS: S&S of myelopathy  WEIGHT BEARING RESTRICTIONS: No  FALLS:  Has patient fallen in last 6 months? 2 in 6 weeks  LIVING ENVIRONMENT: Lives with: lives with their family Lives in: House/apartment Stairs: No Has following equipment at home: Environmental consultant - 4 wheeled  OCCUPATION: retired  PLOF: Independent  PATIENT GOALS: to get more steady on my feet  NEXT MD VISIT: TBD  OBJECTIVE:  Note: Objective measures were completed at Evaluation unless otherwise noted.  DIAGNOSTIC FINDINGS:    PATIENT SURVEYS:  NDI: 25/50 50% perceived disability  POSTURE: rounded shoulders and forward head  PALPATION:  Not tested  CERVICAL ROM:   Active ROM A/PROM (deg) eval  Flexion WFL  Extension 10%  Right lateral flexion 10%  Left lateral flexion 10%  Right rotation 50%  Left rotation 25%   (Blank rows = not tested)  UPPER EXTREMITY ROM:  Active ROM Right eval Left eval  Shoulder flexion 60d 80d  Shoulder extension    Shoulder abduction 50d 80d  Shoulder adduction    Shoulder extension    Shoulder internal rotation    Shoulder external rotation    Elbow flexion    Elbow extension    Wrist flexion    Wrist extension    Wrist ulnar deviation    Wrist radial deviation    Wrist pronation    Wrist supination     (Blank rows = not tested)  UPPER EXTREMITY MMT:  MMT Right eval Left eval  Shoulder flexion 2 3  Shoulder extension 3 4  Shoulder abduction 2 3  Shoulder adduction    Shoulder extension    Shoulder internal rotation    Shoulder external rotation    Middle trapezius    Lower trapezius    Elbow flexion 4 4  Elbow extension 4 4  Wrist flexion    Wrist extension    Wrist ulnar deviation    Wrist radial deviation    Wrist pronation    Wrist supination    Grip strength 24 34  Key Pinch 6 8   (Blank rows = not tested)  CERVICAL SPECIAL TESTS:  deferred  FUNCTIONAL TESTS:  30 seconds chair stand test  1  TREATMENT:               [facility] Adult PT Treatment:                                                DATE: 02/21/24 Eval and HEP Self Care: Additional minutes spent for educating on updated Therapeutic Home Exercise Program as well as comparing current status to condition at start of symptoms. This included exercises focusing on stretching, strengthening, with focus on eccentric aspects. Long term goals include an improvement in range of motion, strength, endurance as well as avoiding reinjury. Patient's frequency would include in 1-2 times a day, 3-5 times a week for a duration of 6-12 weeks. Proper technique shown and discussed handout in great detail. All questions were discussed and addressed.  PATIENT EDUCATION:  Education details: Discussed eval findings, rehab rationale and POC and patient is in agreement  Person educated: Patient Education method: Explanation and Handouts Education comprehension: verbalized understanding and needs further education  HOME EXERCISE PROGRAM: Access Code: [email or web address] URL: [email or web address] Date: 02/21/2024 Prepared by: Reyes Kohut  Exercises - Sit to Stand with Armchair  - 1-2 x daily - 5 x weekly - 1 sets - 5 reps - Seated Heel Toe Raises  - 1-2 x daily - 5 x weekly - 2 sets - 10 reps - Seated Long Arc Quad  - 1-2 x daily - 5 x weekly - 2 sets - 10 reps - Seated Heel Slide  - 1-2 x daily - 5 x weekly - 2 sets - 10 reps  ASSESSMENT:  CLINICAL IMPRESSION: Patient is a 78 y.o. female who was seen today for physical therapy evaluation and treatment for neck pain and B UE strength deficits.  Patient has a history of 2 cervical fusions.  She adopts a forward flexed posture and cannot stand erect due to anterior thoracic pain.  30s chair stand test limited to 1 rep.  Grip and pinch strengths functional B, elbow ROM and  strength functional B but patient unable to flex or abduct OH due to weakness.  PROM of B shoulders functional in flexion and abduction.  Patient goals include increasing her mobility status and is scheduled for further imaging in cervical region to address progressive weakness in B shoulders.  OBJECTIVE IMPAIRMENTS: Abnormal gait, decreased activity tolerance, decreased balance, decreased knowledge of condition, decreased mobility, difficulty walking, decreased ROM, decreased strength, improper body mechanics, and postural dysfunction.   ACTIVITY LIMITATIONS: carrying, lifting, standing, stairs, transfers, and bed mobility  PERSONAL FACTORS: Age, Fitness, Past/current experiences, Time since onset of injury/illness/exacerbation, and 1 comorbidity: cervical fusions are also affecting patient's functional outcome.   REHAB POTENTIAL: Fair based on chronicity, progressive degenerative changes and previous cervical fusions  CLINICAL DECISION MAKING: Evolving/moderate complexity  EVALUATION COMPLEXITY: Low   GOALS: Goals reviewed with patient? No    SHORT TERM GOALS=LONG TERM GOALS: Target date: 04/22/24  Patient to demonstrate independence in HEP  Baseline: [id number] Goal status: INITIAL  2.  Patient will increase 30s chair stand reps from 1 to 4 with arms to demonstrate and improved functional ability with less pain/difficulty as well as reduce fall risk.  Baseline: 1 Goal status: INITIAL  3.  Patient will score at least 17/50 on ODI to signify clinically meaningful improvement in functional abilities.   Baseline: 25/50 Goal status: INITIAL  4.  Increase AROM B shoulders to 90d flexion and abduction Baseline:  Active ROM Right eval Left eval  Shoulder flexion 60d 80d  Shoulder extension    Shoulder abduction 50d 80d   Goal status: INITIAL  5.  Assess 2 MWT and establish baseline Baseline: TBD Goal status: INITIAL     PLAN:  PT FREQUENCY: 1x/week  PT DURATION: 6  weeks  PLANNED INTERVENTIONS: 97110-Therapeutic exercises, 97530- Therapeutic activity, 97112- Neuromuscular re-education, 97535- Self Care, [id number]- Manual therapy, [phone]- Gait training, and Patient/Family education  PLAN FOR NEXT SESSION: HEP review and update, manual techniques as appropriate, aerobic tasks, ROM and flexibility activities, strengthening and PREs, TPDN, gait and balance training as needed     Reyes CHRISTELLA Kohut, PT 02/22/2024, 12:43 PM  Referring diagnosis? M47.12 (ICD-10-CM) - Other spondylosis with myelopathy, cervical region Treatment diagnosis? (if different than referring diagnosis) M47.12 (ICD-10-CM) - Other spondylosis with myelopathy, cervical region What was this (referring  dx) caused by? [x]  Surgery []  Fall []  Ongoing issue []  Arthritis []  Other: ____________  Laterality: []  Rt []  Lt [x]  Both  Check all possible CPT codes:  *CHOOSE 10 OR LESS*    See Planned Interventions listed in the Plan section of the Evaluation.

## 2024-02-21 ENCOUNTER — Encounter: Payer: Self-pay | Admitting: Podiatry

## 2024-02-21 ENCOUNTER — Ambulatory Visit (INDEPENDENT_AMBULATORY_CARE_PROVIDER_SITE_OTHER): Admitting: Podiatry

## 2024-02-21 ENCOUNTER — Other Ambulatory Visit: Payer: Self-pay

## 2024-02-21 ENCOUNTER — Ambulatory Visit: Attending: Neurosurgery

## 2024-02-21 DIAGNOSIS — E1151 Type 2 diabetes mellitus with diabetic peripheral angiopathy without gangrene: Secondary | ICD-10-CM

## 2024-02-21 DIAGNOSIS — M79676 Pain in unspecified toe(s): Secondary | ICD-10-CM | POA: Diagnosis not present

## 2024-02-21 DIAGNOSIS — M5000 Cervical disc disorder with myelopathy, unspecified cervical region: Secondary | ICD-10-CM | POA: Diagnosis not present

## 2024-02-21 DIAGNOSIS — M542 Cervicalgia: Secondary | ICD-10-CM | POA: Diagnosis not present

## 2024-02-21 DIAGNOSIS — B351 Tinea unguium: Secondary | ICD-10-CM

## 2024-02-21 DIAGNOSIS — R293 Abnormal posture: Secondary | ICD-10-CM | POA: Diagnosis not present

## 2024-02-21 NOTE — Progress Notes (Signed)
  Subjective:  Patient ID: Winton DELENA Presto, female    DOB: [birth year],   MRN: [id number]  Chief Complaint  Patient presents with   Diabetes    She's going to cut my toenails. Saw Alexus [place], [place] - 1 month ago; A1c - 5.9    78 y.o. female presents for concern of thickened elongated and painful nails that are difficult to trim. Requesting to have them trimmed today. Relates burning and tingling in their feet. Patient is diabetic and last A1c was  Lab Results  Component Value Date   HGBA1C 6.4 (H) 05/31/2020   .   PCP:  Alys Schuyler HERO, PA   .  Denies any other pedal complaints. Denies n/v/f/c.   PCP Tanda Bame MD   Past Medical History:  Diagnosis Date   Abdominal pain    around naval   Anxiety    Arthritis    Bipolar disorder [facility])    Breast pain in female    Bruises easily    Cancer (HCC) 2003   left lung   Cervical disc disease    Chronic kidney disease    COPD (chronic obstructive pulmonary disease) (HCC)    O2 as needed.   Depression    DJD (degenerative joint disease)    Dyspnea    uses O2 via [place] 2-3L PRN   Gum disease    Hernia    Hypoglycemic episode in patient with diabetes mellitus (HCC) 2021   Diet controlled diabetes with occasional hypoglycemia at night   Hypothyroidism    no meds   IDA (iron deficiency anemia) 03/27/2021   in [place]   Insomnia    not currently a problem   Obstructive sleep apnea 08/06/2013   Phlebitis    Pneumonia 03/2022   mild case   Pre-diabetes    diet controlled, no meds   Thyroid  disease    Walker as ambulation aid    Wears glasses    Weight increase     Objective:  Physical Exam: Vascular: DP/PT pulses 2/4 bilateral. CFT <3 seconds. Normal hair growth on digits. No edema.  Skin. No lacerations or abrasions bilateral feet. Nails 1-5 are thickened discolored and elongated with subungual debris.   Musculoskeletal: MMT 5/5 bilateral lower extremities in DF, PF, Inversion and Eversion. Deceased ROM in DF of  ankle joint.  Neurological: Sensation intact to light touch.   Assessment:   1. Pain due to onychomycosis of toenail   2. Type 2 diabetes mellitus with peripheral vascular disease (HCC)          Plan:  Patient was evaluated and treated and all questions answered. -Discussed and educated patient on diabetic foot care, especially with  regards to the vascular, neurological and musculoskeletal systems.  -Stressed the importance of good glycemic control and the detriment of not  controlling glucose levels in relation to the foot. -Discussed supportive shoes at all times and checking feet regularly.  -Mechanically debrided all nails 1-5 bilateral using sterile nail nipper and filed with dremel without incident  -Answered all patient questions -Patient to return  in 3 months for at risk foot care -Patient advised to call the office if any problems or questions arise in the meantime.   Asberry Failing, DPM

## 2024-02-23 ENCOUNTER — Telehealth: Payer: Self-pay

## 2024-02-23 NOTE — Telephone Encounter (Signed)
 Copied from CRM #[id number]. Topic: Clinical - Order For Equipment >> Feb 22, 2024  9:09 AM Benton O wrote: Reason for CRM:  glen davis with [employer]  calling because Arienna is ordering a new portable oxygen  concentrator so she can carry with her and i faxed over a order and wanted to check on it  Resending fax for order . He just wanted a status but believe it was sent to wrong fax number . He is sending the fax right now . Fairly easy form doctor just need circle flow level and sign and date it >> Feb 23, 2024 10:31 AM Corean SAUNDERS wrote: Addie is requesting an update on the POC as the patient needs it as soon a possible.     Spoke with patient she states looking to purchase a new  Poc outta pocket because ins claim s they wont pay for both home & poc. Waiting on fax from [employer] for provider to sign    NFN

## 2024-02-28 ENCOUNTER — Telehealth: Payer: Self-pay

## 2024-02-28 ENCOUNTER — Ambulatory Visit: Payer: Medicare PPO | Admitting: Internal Medicine

## 2024-02-28 NOTE — Telephone Encounter (Signed)
 Copied from CRM [id number]. Topic: General - Other >> Feb 28, 2024 10:05 AM Rilla NOVAK wrote: Samule @ [employer], Order for portable oxygen  was faxed on 8/01 and 8/05.  Calling to check the status of the order. States patient is getting impatient.   Please call Kimberlee: [phone]  Received order signed by Dr. Neysa today.  Will fax orders now.  Called [place] and relayed information.  Faxed to [employer], attn: Kimberlee Moats at [phone]

## 2024-03-01 ENCOUNTER — Ambulatory Visit: Admitting: Physical Therapy

## 2024-03-01 ENCOUNTER — Encounter: Payer: Self-pay | Admitting: Physical Therapy

## 2024-03-01 DIAGNOSIS — M542 Cervicalgia: Secondary | ICD-10-CM | POA: Diagnosis not present

## 2024-03-01 DIAGNOSIS — M5000 Cervical disc disorder with myelopathy, unspecified cervical region: Secondary | ICD-10-CM | POA: Diagnosis not present

## 2024-03-01 DIAGNOSIS — R293 Abnormal posture: Secondary | ICD-10-CM | POA: Diagnosis not present

## 2024-03-01 NOTE — Therapy (Signed)
 OUTPATIENT PHYSICAL THERAPY CERVICAL EVALUATION   Patient Name: [name] MRN: [id number] DOB:07-09-1946, 78 y.o., female Today's Date: 03/01/2024  END OF SESSION:  PT End of Session - 03/01/24 1444     Visit Number 2    Number of Visits 6    Date for PT Re-Evaluation 04/22/24    PT Start Time 1445    PT Stop Time 1525    PT Time Calculation (min) 40 min    Activity Tolerance Patient tolerated treatment well    Behavior During Therapy [facility] for tasks assessed/performed           Past Medical History:  Diagnosis Date   Abdominal pain    around naval   Anxiety    Arthritis    Bipolar disorder (HCC)    Breast pain in female    Bruises easily    Cancer (HCC) 2003   left lung   Cervical disc disease    Chronic kidney disease    COPD (chronic obstructive pulmonary disease) (HCC)    O2 as needed.   Depression    DJD (degenerative joint disease)    Dyspnea    uses O2 via [place] 2-3L PRN   Gum disease    Hernia    Hypoglycemic episode in patient with diabetes mellitus (HCC) 2021   Diet controlled diabetes with occasional hypoglycemia at night   Hypothyroidism    no meds   IDA (iron deficiency anemia) 03/27/2021   in [place]   Insomnia    not currently a problem   Obstructive sleep apnea 08/06/2013   Phlebitis    Pneumonia 03/2022   mild case   Pre-diabetes    diet controlled, no meds   Thyroid  disease    Walker as ambulation aid    Wears glasses    Weight increase    Past Surgical History:  Procedure Laterality Date   ABDOMINAL HYSTERECTOMY     partial   ANTERIOR CERVICAL DECOMP/DISCECTOMY FUSION N/A 06/05/2022   Procedure: C3-4 ANTERIOR CERVICAL DISCECTOMY FUSION, ALLOGRAFT, PLATE;  Surgeon: Barbarann Oneil BROCKS, MD;  Location: [facility] OR;  Service: Orthopedics;  Laterality: N/A;   CATARACT EXTRACTION  04/2022   FOOT SURGERY     right   HERNIA REPAIR     LAPAROSCOPIC GASTRIC BANDING  08/2008   LAPAROSCOPIC ROUX-EN-Y GASTRIC BYPASS WITH UPPER ENDOSCOPY AND  REMOVAL OF LAP BAND  2018   LEFT HEART CATHETERIZATION WITH CORONARY ANGIOGRAM N/A 05/02/2012   Procedure: LEFT HEART CATHETERIZATION WITH CORONARY ANGIOGRAM;  Surgeon: Erick JONELLE Bergamo, MD;  Location: [facility] CATH LAB;  Service: Cardiovascular;  Laterality: N/A;   LUNG LOBECTOMY  2003   left   MASS EXCISION Right 06/06/2020   Procedure: REMOVAL MASS ON RIGHT BUTTOCK;  Surgeon: Sheldon Standing, MD;  Location: [facility] ORS;  Service: General;  Laterality: Right;   SPINE SURGERY     fusion on the neck   THYROIDECTOMY     partial   TONSILLECTOMY     VENTRAL HERNIA REPAIR N/A 06/06/2020   Procedure: LAPAROSCOPIC INCARCERATED INCISIONAL HERNIA REPAIR WITH MESH AND TAP BLOCK;  Surgeon: Sheldon Standing, MD;  Location: [facility] ORS;  Service: General;  Laterality: N/A;   Patient Active Problem List   Diagnosis Date Noted   Rib pain on left side 11/24/2022   Chronic back pain 11/24/2022   Depression 11/24/2022   Costochondritis 10/30/2022   Impingement syndrome of right shoulder 09/22/2022   S/P cervical spinal fusion 06/21/2022   Cervical stenosis of spine  06/05/2022   Sinusitis, acute maxillary 03/31/2022   Left lower lobe pneumonia 03/31/2022   Change in bowel habit 11/24/2021   Epigastric pain 11/24/2021   Internal hemorrhoids 11/24/2021   History of colonic polyps 11/24/2021   Urticaria 11/24/2021   Spinal stenosis of cervical region 11/05/2021   Pincer nail deformity 09/05/2021   History of diabetes mellitus 03/20/2021   Iron deficiency 03/20/2021   Pruritus 01/09/2021   Pain in left leg 12/23/2020   Prediabetes 11/18/2020   Incarcerated incisional hernia s/p lap repair w mesh 06/06/2020 06/06/2020   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   Polyp of colon 10/23/2019   Chronic respiratory failure with hypoxia (HCC) 04/01/2019   Medication management 02/28/2019   Combined forms of age-related cataract of both eyes 12/01/2018   Early stage nonexudative age-related macular degeneration of  both eyes 12/01/2018   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   PVD (posterior vitreous detachment), left 11/28/2018   Dysphagia 12/30/2017   Asthma 04/12/2017   BMI 40.0-44.9, adult (HCC) 04/12/2017   Irritable bowel syndrome 04/12/2017   Osteopenia 04/12/2017   Umbilical hernia without obstruction and without gangrene 03/18/2017   Hiatal hernia 01/26/2017   Vitamin D deficiency 06/16/2016   Encounter for preoperative examination for general surgical procedure 03/06/2016   Hypercholesterolemia 02/25/2016   Hypertensive disorder 02/25/2016   Cough 08/02/2015   Obstructive sleep apnea 08/06/2013   Neoplasm of breast 07/20/2013   COPD with acute bronchitis (HCC) 06/18/2013   Myalgia 06/18/2013   History of laparoscopic adjustable gastric banding, APS. 09/04/2008. 04/08/2011   History of lung cancer 06/27/2010   SKIN RASH 12/13/2009   DYSPNEA 07/05/2008   DIABETES, TYPE 2 07/01/2008   Obesity, morbid (HCC) 07/01/2008   Type 2 diabetes mellitus without complications (HCC) 07/01/2008   COPD mixed type (HCC) 06/26/2008   Malignant neoplasm of lung (HCC) 07/20/2001    PCP: Alys Schuyler HERO, PA   REFERRING PROVIDER: Debby Dorn MATSU, MD  REFERRING DIAG: [phone] (ICD-10-CM) - Other spondylosis with myelopathy, cervical region  THERAPY DIAG:  Cervicalgia  Cervical disc disease with myelopathy  Abnormal posture  Rationale for Evaluation and Treatment: Rehabilitation  ONSET DATE: chronic  SUBJECTIVE:                                                                                                                                                                                                         SUBJECTIVE STATEMENT: Pt attended today's session with reports of 0/10 pain. Pt stated that they have maintained good compliance with current HEP.  Hand dominance: Right  PERTINENT HISTORY:    PAIN:  Are you having pain? Yes: NPRS scale: 3-4/10 Pain location:  neck Pain description: sore Aggravating factors: activity involving  Relieving factors: heat  PRECAUTIONS: Other: cervical fusion C3-6  RED FLAGS: S&S of myelopathy  WEIGHT BEARING RESTRICTIONS: No  FALLS:  Has patient fallen in last 6 months? 2 in 6 weeks  LIVING ENVIRONMENT: Lives with: lives with their family Lives in: House/apartment Stairs: No Has following equipment at home: Environmental consultant - 4 wheeled  OCCUPATION: retired  PLOF: Independent  PATIENT GOALS: to get more steady on my feet  NEXT MD VISIT: TBD  OBJECTIVE:  Note: Objective measures were completed at Evaluation unless otherwise noted.  DIAGNOSTIC FINDINGS:    PATIENT SURVEYS:  NDI: 25/50 50% perceived disability  POSTURE: rounded shoulders and forward head  PALPATION:  Not tested  CERVICAL ROM:   Active ROM A/PROM (deg) eval  Flexion WFL  Extension 10%  Right lateral flexion 10%  Left lateral flexion 10%  Right rotation 50%  Left rotation 25%   (Blank rows = not tested)  UPPER EXTREMITY ROM:  Active ROM Right eval Left eval  Shoulder flexion 60d 80d  Shoulder extension    Shoulder abduction 50d 80d  Shoulder adduction    Shoulder extension    Shoulder internal rotation    Shoulder external rotation    Elbow flexion    Elbow extension    Wrist flexion    Wrist extension    Wrist ulnar deviation    Wrist radial deviation    Wrist pronation    Wrist supination     (Blank rows = not tested)  UPPER EXTREMITY MMT:  MMT Right eval Left eval  Shoulder flexion 2 3  Shoulder extension 3 4  Shoulder abduction 2 3  Shoulder adduction    Shoulder extension    Shoulder internal rotation    Shoulder external rotation    Middle trapezius    Lower trapezius    Elbow flexion 4 4  Elbow extension 4 4  Wrist flexion    Wrist extension    Wrist ulnar deviation    Wrist radial deviation    Wrist pronation    Wrist supination    Grip strength 24 34  Key Pinch 6 8   (Blank rows = not  tested)  CERVICAL SPECIAL TESTS:  deferred  FUNCTIONAL TESTS:  30 seconds chair stand test 1  TREATMENT:    [facility] Adult PT Treatment:                                                DATE: 03/01/2024 Therapeutic Activity: NuStep 6' for activity tolerance  Standing flexion with UE ranger (set at 20) 1x20 Supine reciprocal OH flexion with marching 2x15 ea Supine chest press with UE  ranger 1x30  Supine ABD with dowel 1x20               [facility] Adult PT Treatment:                                                DATE: 02/21/24 Eval and HEP Self Care: Additional minutes spent for educating on updated Therapeutic Home Exercise Program as  well as comparing current status to condition at start of symptoms. This included exercises focusing on stretching, strengthening, with focus on eccentric aspects. Long term goals include an improvement in range of motion, strength, endurance as well as avoiding reinjury. Patient's frequency would include in 1-2 times a day, 3-5 times a week for a duration of 6-12 weeks. Proper technique shown and discussed handout in great detail. All questions were discussed and addressed.                                                                                                                     PATIENT EDUCATION:  Education details: Discussed eval findings, rehab rationale and POC and patient is in agreement  Person educated: Patient Education method: Explanation and Handouts Education comprehension: verbalized understanding and needs further education  HOME EXERCISE PROGRAM: Access Code: [email or web address] URL: [email or web address] Date: 02/21/2024 Prepared by: Reyes Kohut  Exercises - Sit to Stand with Armchair  - 1-2 x daily - 5 x weekly - 1 sets - 5 reps - Seated Heel Toe Raises  - 1-2 x daily - 5 x weekly - 2 sets - 10 reps - Seated Long Arc Quad  - 1-2 x daily - 5 x weekly - 2 sets - 10 reps - Seated Heel Slide  - 1-2 x daily - 5 x weekly - 2  sets - 10 reps  ASSESSMENT:  CLINICAL IMPRESSION: Pt attended physical therapy session for continuation of treatment regarding BUE dysfunction with R>L. Today's treatment focused on improvement of  OH mobility, BUE strength, and posterior chain endurance/strength. Pt demonstrated RUE impairment inline with a degenerative supraspinatus tear, however, has no recent imaging to rule this dx in. Pt showed good tolerance to administered treatment with no adverse effects by the end of session. Skilled intervention was utilized via activity modification for pt tolerance with task completion, functional progression/regression promoting best outcomes inline with current rehab goals, as well as minimal verbal/tactile cuing alongside no physical assistance for safe and appropriate performance of today's activities. Continue to progress with OH mobility and global strengthening, and gait/transfer quality as tolerated.   Patient is a 78 y.o. female who was seen today for physical therapy evaluation and treatment for neck pain and B UE strength deficits.  Patient has a history of 2 cervical fusions.  She adopts a forward flexed posture and cannot stand erect due to anterior thoracic pain.  30s chair stand test limited to 1 rep.  Grip and pinch strengths functional B, elbow ROM and strength functional B but patient unable to flex or abduct OH due to weakness.  PROM of B shoulders functional in flexion and abduction.  Patient goals include increasing her mobility status and is scheduled for further imaging in cervical region to address progressive weakness in B shoulders.  OBJECTIVE IMPAIRMENTS: Abnormal gait, decreased activity tolerance, decreased balance, decreased knowledge of condition, decreased mobility, difficulty walking, decreased ROM, decreased strength, improper body mechanics, and postural dysfunction.   ACTIVITY LIMITATIONS: carrying, lifting, standing,  stairs, transfers, and bed mobility  PERSONAL  FACTORS: Age, Fitness, Past/current experiences, Time since onset of injury/illness/exacerbation, and 1 comorbidity: cervical fusions are also affecting patient's functional outcome.   REHAB POTENTIAL: Fair based on chronicity, progressive degenerative changes and previous cervical fusions  CLINICAL DECISION MAKING: Evolving/moderate complexity  EVALUATION COMPLEXITY: Low   GOALS: Goals reviewed with patient? No    SHORT TERM GOALS=LONG TERM GOALS: Target date: 04/22/24  Patient to demonstrate independence in HEP  Baseline: [id number] Goal status: INITIAL  2.  Patient will increase 30s chair stand reps from 1 to 4 with arms to demonstrate and improved functional ability with less pain/difficulty as well as reduce fall risk.  Baseline: 1 Goal status: INITIAL  3.  Patient will score at least 17/50 on ODI to signify clinically meaningful improvement in functional abilities.   Baseline: 25/50 Goal status: INITIAL  4.  Increase AROM B shoulders to 90d flexion and abduction Baseline:  Active ROM Right eval Left eval  Shoulder flexion 60d 80d  Shoulder extension    Shoulder abduction 50d 80d   Goal status: INITIAL  5.  Assess 2 MWT and establish baseline Baseline: TBD Goal status: INITIAL    PLAN:  PT FREQUENCY: 1x/week  PT DURATION: 6 weeks  PLANNED INTERVENTIONS: 97110-Therapeutic exercises, 97530- Therapeutic activity, 97112- Neuromuscular re-education, 97535- Self Care, [id number]- Manual therapy, [phone]- Gait training, and Patient/Family education  PLAN FOR NEXT SESSION: HEP review and update, manual techniques as appropriate, aerobic tasks, ROM and flexibility activities, strengthening and PREs, TPDN, gait and balance training as needed     Mabel Kiang, PT, DPT 03/01/2024, 3:27 PM   Referring diagnosis? M47.12 (ICD-10-CM) - Other spondylosis with myelopathy, cervical region Treatment diagnosis? (if different than referring diagnosis) M47.12 (ICD-10-CM) - Other  spondylosis with myelopathy, cervical region What was this (referring dx) caused by? [x]  Surgery []  Fall []  Ongoing issue []  Arthritis []  Other: ____________  Laterality: []  Rt []  Lt [x]  Both  Check all possible CPT codes:  *CHOOSE 10 OR LESS*    See Planned Interventions listed in the Plan section of the Evaluation.

## 2024-03-02 ENCOUNTER — Other Ambulatory Visit

## 2024-03-03 ENCOUNTER — Telehealth: Payer: Self-pay

## 2024-03-03 DIAGNOSIS — J9611 Chronic respiratory failure with hypoxia: Secondary | ICD-10-CM | POA: Diagnosis not present

## 2024-03-03 DIAGNOSIS — J449 Chronic obstructive pulmonary disease, unspecified: Secondary | ICD-10-CM | POA: Diagnosis not present

## 2024-03-03 NOTE — Telephone Encounter (Signed)
 Received CMN via fax from [employer]. Placed on Dr. Saundra desk in C Pod for his signature. Once signed, will fax back to [employer] at [phone]

## 2024-03-07 ENCOUNTER — Ambulatory Visit
Admission: RE | Admit: 2024-03-07 | Discharge: 2024-03-07 | Disposition: A | Discharge status: Home / Self Care | Source: Ambulatory Visit | Attending: Neurosurgery | Admitting: Neurosurgery

## 2024-03-07 DIAGNOSIS — M50223 Other cervical disc displacement at C6-C7 level: Secondary | ICD-10-CM | POA: Diagnosis not present

## 2024-03-07 DIAGNOSIS — R531 Weakness: Secondary | ICD-10-CM

## 2024-03-07 DIAGNOSIS — M50221 Other cervical disc displacement at C4-C5 level: Secondary | ICD-10-CM | POA: Diagnosis not present

## 2024-03-07 DIAGNOSIS — M4712 Other spondylosis with myelopathy, cervical region: Secondary | ICD-10-CM

## 2024-03-07 DIAGNOSIS — R29818 Other symptoms and signs involving the nervous system: Secondary | ICD-10-CM | POA: Diagnosis not present

## 2024-03-07 DIAGNOSIS — M4802 Spinal stenosis, cervical region: Secondary | ICD-10-CM | POA: Diagnosis not present

## 2024-03-08 ENCOUNTER — Ambulatory Visit

## 2024-03-08 DIAGNOSIS — R293 Abnormal posture: Secondary | ICD-10-CM

## 2024-03-08 DIAGNOSIS — M5000 Cervical disc disorder with myelopathy, unspecified cervical region: Secondary | ICD-10-CM

## 2024-03-08 DIAGNOSIS — M542 Cervicalgia: Secondary | ICD-10-CM

## 2024-03-08 NOTE — Therapy (Signed)
 OUTPATIENT PHYSICAL THERAPY TREATMENT NOTE   Patient Name: [name] MRN: [id number] DOB:Apr 02, 1946, 78 y.o., female Today's Date: 03/08/2024  END OF SESSION:  PT End of Session - 03/08/24 1403     Visit Number 3    Number of Visits 6    Date for PT Re-Evaluation 04/22/24    Authorization Type [employer] MCR    PT Start Time 1402    PT Stop Time 1440    PT Time Calculation (min) 38 min    Activity Tolerance Patient tolerated treatment well    Behavior During Therapy WFL for tasks assessed/performed          Past Medical History:  Diagnosis Date   Abdominal pain    around naval   Anxiety    Arthritis    Bipolar disorder (HCC)    Breast pain in female    Bruises easily    Cancer (HCC) 2003   left lung   Cervical disc disease    Chronic kidney disease    COPD (chronic obstructive pulmonary disease) (HCC)    O2 as needed.   Depression    DJD (degenerative joint disease)    Dyspnea    uses O2 via Warrior 2-3L PRN   Gum disease    Hernia    Hypoglycemic episode in patient with diabetes mellitus (HCC) 2021   Diet controlled diabetes with occasional hypoglycemia at night   Hypothyroidism    no meds   IDA (iron deficiency anemia) 03/27/2021   in [place]   Insomnia    not currently a problem   Obstructive sleep apnea 08/06/2013   Phlebitis    Pneumonia 03/2022   mild case   Pre-diabetes    diet controlled, no meds   Thyroid  disease    Walker as ambulation aid    Wears glasses    Weight increase    Past Surgical History:  Procedure Laterality Date   ABDOMINAL HYSTERECTOMY     partial   ANTERIOR CERVICAL DECOMP/DISCECTOMY FUSION N/A 06/05/2022   Procedure: C3-4 ANTERIOR CERVICAL DISCECTOMY FUSION, ALLOGRAFT, PLATE;  Surgeon: Barbarann Oneil BROCKS, MD;  Location: [facility] OR;  Service: Orthopedics;  Laterality: N/A;   CATARACT EXTRACTION  04/2022   FOOT SURGERY     right   HERNIA REPAIR     LAPAROSCOPIC GASTRIC BANDING  08/2008   LAPAROSCOPIC ROUX-EN-Y GASTRIC BYPASS  WITH UPPER ENDOSCOPY AND REMOVAL OF LAP BAND  2018   LEFT HEART CATHETERIZATION WITH CORONARY ANGIOGRAM N/A 05/02/2012   Procedure: LEFT HEART CATHETERIZATION WITH CORONARY ANGIOGRAM;  Surgeon: Erick JONELLE Bergamo, MD;  Location: [facility] CATH LAB;  Service: Cardiovascular;  Laterality: N/A;   LUNG LOBECTOMY  2003   left   MASS EXCISION Right 06/06/2020   Procedure: REMOVAL MASS ON RIGHT BUTTOCK;  Surgeon: Sheldon Standing, MD;  Location: [facility] ORS;  Service: General;  Laterality: Right;   SPINE SURGERY     fusion on the neck   THYROIDECTOMY     partial   TONSILLECTOMY     VENTRAL HERNIA REPAIR N/A 06/06/2020   Procedure: LAPAROSCOPIC INCARCERATED INCISIONAL HERNIA REPAIR WITH MESH AND TAP BLOCK;  Surgeon: Sheldon Standing, MD;  Location: [facility] ORS;  Service: General;  Laterality: N/A;   Patient Active Problem List   Diagnosis Date Noted   Rib pain on left side 11/24/2022   Chronic back pain 11/24/2022   Depression 11/24/2022   Costochondritis 10/30/2022   Impingement syndrome of right shoulder 09/22/2022   S/P cervical spinal fusion 06/21/2022  Cervical stenosis of spine 06/05/2022   Sinusitis, acute maxillary 03/31/2022   Left lower lobe pneumonia 03/31/2022   Change in bowel habit 11/24/2021   Epigastric pain 11/24/2021   Internal hemorrhoids 11/24/2021   History of colonic polyps 11/24/2021   Urticaria 11/24/2021   Spinal stenosis of cervical region 11/05/2021   Pincer nail deformity 09/05/2021   History of diabetes mellitus 03/20/2021   Iron deficiency 03/20/2021   Pruritus 01/09/2021   Pain in left leg 12/23/2020   Prediabetes 11/18/2020   Incarcerated incisional hernia s/p lap repair w mesh 06/06/2020 06/06/2020   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   Polyp of colon 10/23/2019   Chronic respiratory failure with hypoxia (HCC) 04/01/2019   Medication management 02/28/2019   Combined forms of age-related cataract of both eyes 12/01/2018   Early stage nonexudative age-related  macular degeneration of both eyes 12/01/2018   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   PVD (posterior vitreous detachment), left 11/28/2018   Dysphagia 12/30/2017   Asthma 04/12/2017   BMI 40.0-44.9, adult (HCC) 04/12/2017   Irritable bowel syndrome 04/12/2017   Osteopenia 04/12/2017   Umbilical hernia without obstruction and without gangrene 03/18/2017   Hiatal hernia 01/26/2017   Vitamin D deficiency 06/16/2016   Encounter for preoperative examination for general surgical procedure 03/06/2016   Hypercholesterolemia 02/25/2016   Hypertensive disorder 02/25/2016   Cough 08/02/2015   Obstructive sleep apnea 08/06/2013   Neoplasm of breast 07/20/2013   COPD with acute bronchitis (HCC) 06/18/2013   Myalgia 06/18/2013   History of laparoscopic adjustable gastric banding, APS. 09/04/2008. 04/08/2011   History of lung cancer 06/27/2010   SKIN RASH 12/13/2009   DYSPNEA 07/05/2008   DIABETES, TYPE 2 07/01/2008   Obesity, morbid (HCC) 07/01/2008   Type 2 diabetes mellitus without complications (HCC) 07/01/2008   COPD mixed type (HCC) 06/26/2008   Malignant neoplasm of lung (HCC) 07/20/2001    PCP: Alys Schuyler HERO, PA   REFERRING PROVIDER: Debby Dorn MATSU, MD  REFERRING DIAG: [phone] (ICD-10-CM) - Other spondylosis with myelopathy, cervical region  THERAPY DIAG:  Cervicalgia  Cervical disc disease with myelopathy  Abnormal posture  Rationale for Evaluation and Treatment: Rehabilitation  ONSET DATE: chronic  SUBJECTIVE:                                                                                                                                                                                                         SUBJECTIVE STATEMENT: Patient reports no current pain, states she has had some off and on lightheadedness. She states she  had an MRI yesterday, is awaiting results.    Hand dominance: Right  PERTINENT HISTORY:    PAIN:  Are you having pain? Yes: NPRS  scale: 3-4/10 Pain location: neck Pain description: sore Aggravating factors: activity involving  Relieving factors: heat  PRECAUTIONS: Other: cervical fusion C3-6  RED FLAGS: S&S of myelopathy  WEIGHT BEARING RESTRICTIONS: No  FALLS:  Has patient fallen in last 6 months? 2 in 6 weeks  LIVING ENVIRONMENT: Lives with: lives with their family Lives in: House/apartment Stairs: No Has following equipment at home: Environmental consultant - 4 wheeled  OCCUPATION: retired  PLOF: Independent  PATIENT GOALS: to get more steady on my feet  NEXT MD VISIT: TBD  OBJECTIVE:  Note: Objective measures were completed at Evaluation unless otherwise noted.  DIAGNOSTIC FINDINGS:    PATIENT SURVEYS:  NDI: 25/50 50% perceived disability  POSTURE: rounded shoulders and forward head  PALPATION:  Not tested  CERVICAL ROM:   Active ROM A/PROM (deg) eval  Flexion WFL  Extension 10%  Right lateral flexion 10%  Left lateral flexion 10%  Right rotation 50%  Left rotation 25%   (Blank rows = not tested)  UPPER EXTREMITY ROM:  Active ROM Right eval Left eval  Shoulder flexion 60d 80d  Shoulder extension    Shoulder abduction 50d 80d  Shoulder adduction    Shoulder extension    Shoulder internal rotation    Shoulder external rotation    Elbow flexion    Elbow extension    Wrist flexion    Wrist extension    Wrist ulnar deviation    Wrist radial deviation    Wrist pronation    Wrist supination     (Blank rows = not tested)  UPPER EXTREMITY MMT:  MMT Right eval Left eval  Shoulder flexion 2 3  Shoulder extension 3 4  Shoulder abduction 2 3  Shoulder adduction    Shoulder extension    Shoulder internal rotation    Shoulder external rotation    Middle trapezius    Lower trapezius    Elbow flexion 4 4  Elbow extension 4 4  Wrist flexion    Wrist extension    Wrist ulnar deviation    Wrist radial deviation    Wrist pronation    Wrist supination    Grip strength 24 34  Key  Pinch 6 8   (Blank rows = not tested)  CERVICAL SPECIAL TESTS:  deferred  FUNCTIONAL TESTS:  30 seconds chair stand test 1  TREATMENT:    [facility] Adult PT Treatment:                                                DATE: 03/08/24 Therapeutic Exercise: NuStep level 4 x 8' for activity tolerance  Seated elbow flex/ext AROM 2x15 Supine reciprocal OH flexion with marching 2x15 ea Supine chest press with dowel 1x30  Seated scapular retraction x10   [facility] Adult PT Treatment:                                                DATE: 03/01/2024 Therapeutic Activity: NuStep 6' for activity tolerance  Standing flexion with UE ranger (set at 20) 1x20 Supine reciprocal OH flexion with marching  2x15 ea Supine chest press with UE  ranger 1x30  Supine ABD with dowel 1x20               [facility] Adult PT Treatment:                                                DATE: 02/21/24 Eval and HEP Self Care: Additional minutes spent for educating on updated Therapeutic Home Exercise Program as well as comparing current status to condition at start of symptoms. This included exercises focusing on stretching, strengthening, with focus on eccentric aspects. Long term goals include an improvement in range of motion, strength, endurance as well as avoiding reinjury. Patient's frequency would include in 1-2 times a day, 3-5 times a week for a duration of 6-12 weeks. Proper technique shown and discussed handout in great detail. All questions were discussed and addressed.                                                                                                                     PATIENT EDUCATION:  Education details: Discussed eval findings, rehab rationale and POC and patient is in agreement  Person educated: Patient Education method: Explanation and Handouts Education comprehension: verbalized understanding and needs further education  HOME EXERCISE PROGRAM: Access Code: [email or web address] URL:  [email or web address] Date: 02/21/2024 Prepared by: Reyes Kohut  Exercises - Sit to Stand with Armchair  - 1-2 x daily - 5 x weekly - 1 sets - 5 reps - Seated Heel Toe Raises  - 1-2 x daily - 5 x weekly - 2 sets - 10 reps - Seated Long Arc Quad  - 1-2 x daily - 5 x weekly - 2 sets - 10 reps - Seated Heel Slide  - 1-2 x daily - 5 x weekly - 2 sets - 10 reps  ASSESSMENT:  CLINICAL IMPRESSION: Patient presents to PT reporting no current pain, states she has some off and on lightheadedness today that she attributes to having some low blood sugar last night. She states that she has not checked her blood sugar today, but feels well enough to complete PT. Session today continued to focus on UE strengthening as able. Patient was able to tolerate all prescribed exercises with no adverse effects. Patient continues to benefit from skilled PT services and should be progressed as able to improve functional independence.   EVAL: Patient is a 78 y.o. female who was seen today for physical therapy evaluation and treatment for neck pain and B UE strength deficits.  Patient has a history of 2 cervical fusions.  She adopts a forward flexed posture and cannot stand erect due to anterior thoracic pain.  30s chair stand test limited to 1 rep.  Grip and pinch strengths functional B, elbow ROM and strength functional B but patient unable to flex or abduct OH due to weakness.  PROM  of B shoulders functional in flexion and abduction.  Patient goals include increasing her mobility status and is scheduled for further imaging in cervical region to address progressive weakness in B shoulders.  OBJECTIVE IMPAIRMENTS: Abnormal gait, decreased activity tolerance, decreased balance, decreased knowledge of condition, decreased mobility, difficulty walking, decreased ROM, decreased strength, improper body mechanics, and postural dysfunction.   ACTIVITY LIMITATIONS: carrying, lifting, standing, stairs, transfers, and  bed mobility  PERSONAL FACTORS: Age, Fitness, Past/current experiences, Time since onset of injury/illness/exacerbation, and 1 comorbidity: cervical fusions are also affecting patient's functional outcome.   REHAB POTENTIAL: Fair based on chronicity, progressive degenerative changes and previous cervical fusions  CLINICAL DECISION MAKING: Evolving/moderate complexity  EVALUATION COMPLEXITY: Low   GOALS: Goals reviewed with patient? No    SHORT TERM GOALS=LONG TERM GOALS: Target date: 04/22/24  Patient to demonstrate independence in HEP  Baseline: [id number] Goal status: INITIAL  2.  Patient will increase 30s chair stand reps from 1 to 4 with arms to demonstrate and improved functional ability with less pain/difficulty as well as reduce fall risk.  Baseline: 1 Goal status: INITIAL  3.  Patient will score at least 17/50 on ODI to signify clinically meaningful improvement in functional abilities.   Baseline: 25/50 Goal status: INITIAL  4.  Increase AROM B shoulders to 90d flexion and abduction Baseline:  Active ROM Right eval Left eval  Shoulder flexion 60d 80d  Shoulder extension    Shoulder abduction 50d 80d   Goal status: INITIAL  5.  Assess 2 MWT and establish baseline Baseline: TBD Goal status: INITIAL    PLAN:  PT FREQUENCY: 1x/week  PT DURATION: 6 weeks  PLANNED INTERVENTIONS: 97110-Therapeutic exercises, 97530- Therapeutic activity, 97112- Neuromuscular re-education, 97535- Self Care, [id number]- Manual therapy, [phone]- Gait training, and Patient/Family education  PLAN FOR NEXT SESSION: HEP review and update, manual techniques as appropriate, aerobic tasks, ROM and flexibility activities, strengthening and PREs, TPDN, gait and balance training as needed     Mabel Kiang, PT, DPT 03/08/2024, 2:43 PM   Referring diagnosis? M47.12 (ICD-10-CM) - Other spondylosis with myelopathy, cervical region Treatment diagnosis? (if different than referring diagnosis)  M47.12 (ICD-10-CM) - Other spondylosis with myelopathy, cervical region What was this (referring dx) caused by? [x]  Surgery []  Fall []  Ongoing issue []  Arthritis []  Other: ____________  Laterality: []  Rt []  Lt [x]  Both  Check all possible CPT codes:  *CHOOSE 10 OR LESS*    See Planned Interventions listed in the Plan section of the Evaluation.

## 2024-03-14 DIAGNOSIS — R350 Frequency of micturition: Secondary | ICD-10-CM | POA: Diagnosis not present

## 2024-03-14 DIAGNOSIS — E7800 Pure hypercholesterolemia, unspecified: Secondary | ICD-10-CM | POA: Diagnosis not present

## 2024-03-14 DIAGNOSIS — E1169 Type 2 diabetes mellitus with other specified complication: Secondary | ICD-10-CM | POA: Diagnosis not present

## 2024-03-14 DIAGNOSIS — L989 Disorder of the skin and subcutaneous tissue, unspecified: Secondary | ICD-10-CM | POA: Diagnosis not present

## 2024-03-15 ENCOUNTER — Ambulatory Visit

## 2024-03-15 DIAGNOSIS — M542 Cervicalgia: Secondary | ICD-10-CM | POA: Diagnosis not present

## 2024-03-15 DIAGNOSIS — M5000 Cervical disc disorder with myelopathy, unspecified cervical region: Secondary | ICD-10-CM | POA: Diagnosis not present

## 2024-03-15 DIAGNOSIS — R293 Abnormal posture: Secondary | ICD-10-CM

## 2024-03-15 NOTE — Therapy (Signed)
 OUTPATIENT PHYSICAL THERAPY TREATMENT NOTE   Patient Name: [name] MRN: [id number] DOB:1946-05-26, 78 y.o., female Today's Date: 03/15/2024  END OF SESSION:  PT End of Session - 03/15/24 1359     Visit Number 4    Number of Visits 6    Date for PT Re-Evaluation 04/22/24    Authorization Type [employer] MCR    PT Start Time 1400    PT Stop Time 1440    PT Time Calculation (min) 40 min    Activity Tolerance Patient tolerated treatment well    Behavior During Therapy WFL for tasks assessed/performed           Past Medical History:  Diagnosis Date   Abdominal pain    around naval   Anxiety    Arthritis    Bipolar disorder (HCC)    Breast pain in female    Bruises easily    Cancer (HCC) 2003   left lung   Cervical disc disease    Chronic kidney disease    COPD (chronic obstructive pulmonary disease) (HCC)    O2 as needed.   Depression    DJD (degenerative joint disease)    Dyspnea    uses O2 via [place] 2-3L PRN   Gum disease    Hernia    Hypoglycemic episode in patient with diabetes mellitus (HCC) 2021   Diet controlled diabetes with occasional hypoglycemia at night   Hypothyroidism    no meds   IDA (iron deficiency anemia) 03/27/2021   in [place]   Insomnia    not currently a problem   Obstructive sleep apnea 08/06/2013   Phlebitis    Pneumonia 03/2022   mild case   Pre-diabetes    diet controlled, no meds   Thyroid  disease    Walker as ambulation aid    Wears glasses    Weight increase    Past Surgical History:  Procedure Laterality Date   ABDOMINAL HYSTERECTOMY     partial   ANTERIOR CERVICAL DECOMP/DISCECTOMY FUSION N/A 06/05/2022   Procedure: C3-4 ANTERIOR CERVICAL DISCECTOMY FUSION, ALLOGRAFT, PLATE;  Surgeon: Barbarann Oneil BROCKS, MD;  Location: [facility] OR;  Service: Orthopedics;  Laterality: N/A;   CATARACT EXTRACTION  04/2022   FOOT SURGERY     right   HERNIA REPAIR     LAPAROSCOPIC GASTRIC BANDING  08/2008   LAPAROSCOPIC ROUX-EN-Y GASTRIC BYPASS  WITH UPPER ENDOSCOPY AND REMOVAL OF LAP BAND  2018   LEFT HEART CATHETERIZATION WITH CORONARY ANGIOGRAM N/A 05/02/2012   Procedure: LEFT HEART CATHETERIZATION WITH CORONARY ANGIOGRAM;  Surgeon: Erick JONELLE Bergamo, MD;  Location: [facility] CATH LAB;  Service: Cardiovascular;  Laterality: N/A;   LUNG LOBECTOMY  2003   left   MASS EXCISION Right 06/06/2020   Procedure: REMOVAL MASS ON RIGHT BUTTOCK;  Surgeon: Sheldon Standing, MD;  Location: [facility] ORS;  Service: General;  Laterality: Right;   SPINE SURGERY     fusion on the neck   THYROIDECTOMY     partial   TONSILLECTOMY     VENTRAL HERNIA REPAIR N/A 06/06/2020   Procedure: LAPAROSCOPIC INCARCERATED INCISIONAL HERNIA REPAIR WITH MESH AND TAP BLOCK;  Surgeon: Sheldon Standing, MD;  Location: [facility] ORS;  Service: General;  Laterality: N/A;   Patient Active Problem List   Diagnosis Date Noted   Rib pain on left side 11/24/2022   Chronic back pain 11/24/2022   Depression 11/24/2022   Costochondritis 10/30/2022   Impingement syndrome of right shoulder 09/22/2022   S/P cervical spinal fusion  06/21/2022   Cervical stenosis of spine 06/05/2022   Sinusitis, acute maxillary 03/31/2022   Left lower lobe pneumonia 03/31/2022   Change in bowel habit 11/24/2021   Epigastric pain 11/24/2021   Internal hemorrhoids 11/24/2021   History of colonic polyps 11/24/2021   Urticaria 11/24/2021   Spinal stenosis of cervical region 11/05/2021   Pincer nail deformity 09/05/2021   History of diabetes mellitus 03/20/2021   Iron deficiency 03/20/2021   Pruritus 01/09/2021   Pain in left leg 12/23/2020   Prediabetes 11/18/2020   Incarcerated incisional hernia s/p lap repair w mesh 06/06/2020 06/06/2020   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   Polyp of colon 10/23/2019   Chronic respiratory failure with hypoxia (HCC) 04/01/2019   Medication management 02/28/2019   Combined forms of age-related cataract of both eyes 12/01/2018   Early stage nonexudative age-related  macular degeneration of both eyes 12/01/2018   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   PVD (posterior vitreous detachment), left 11/28/2018   Dysphagia 12/30/2017   Asthma 04/12/2017   BMI 40.0-44.9, adult (HCC) 04/12/2017   Irritable bowel syndrome 04/12/2017   Osteopenia 04/12/2017   Umbilical hernia without obstruction and without gangrene 03/18/2017   Hiatal hernia 01/26/2017   Vitamin D deficiency 06/16/2016   Encounter for preoperative examination for general surgical procedure 03/06/2016   Hypercholesterolemia 02/25/2016   Hypertensive disorder 02/25/2016   Cough 08/02/2015   Obstructive sleep apnea 08/06/2013   Neoplasm of breast 07/20/2013   COPD with acute bronchitis (HCC) 06/18/2013   Myalgia 06/18/2013   History of laparoscopic adjustable gastric banding, APS. 09/04/2008. 04/08/2011   History of lung cancer 06/27/2010   SKIN RASH 12/13/2009   DYSPNEA 07/05/2008   DIABETES, TYPE 2 07/01/2008   Obesity, morbid (HCC) 07/01/2008   Type 2 diabetes mellitus without complications (HCC) 07/01/2008   COPD mixed type (HCC) 06/26/2008   Malignant neoplasm of lung (HCC) 07/20/2001    PCP: Alys Schuyler HERO, PA   REFERRING PROVIDER: Debby Dorn MATSU, MD  REFERRING DIAG: [phone] (ICD-10-CM) - Other spondylosis with myelopathy, cervical region  THERAPY DIAG:  Cervicalgia  Cervical disc disease with myelopathy  Abnormal posture  Rationale for Evaluation and Treatment: Rehabilitation  ONSET DATE: chronic  SUBJECTIVE:                                                                                                                                                                                                         SUBJECTIVE STATEMENT: Patient reports minimal pain today. Still waiting on MRI results.   Hand dominance:  Right  PERTINENT HISTORY:    PAIN:  Are you having pain? Yes: NPRS scale: 3-4/10 Pain location: neck Pain description: sore Aggravating  factors: activity involving  Relieving factors: heat  PRECAUTIONS: Other: cervical fusion C3-6  RED FLAGS: S&S of myelopathy  WEIGHT BEARING RESTRICTIONS: No  FALLS:  Has patient fallen in last 6 months? 2 in 6 weeks  LIVING ENVIRONMENT: Lives with: lives with their family Lives in: House/apartment Stairs: No Has following equipment at home: Environmental consultant - 4 wheeled  OCCUPATION: retired  PLOF: Independent  PATIENT GOALS: to get more steady on my feet  NEXT MD VISIT: TBD  OBJECTIVE:  Note: Objective measures were completed at Evaluation unless otherwise noted.  DIAGNOSTIC FINDINGS:    PATIENT SURVEYS:  NDI: 25/50 50% perceived disability  POSTURE: rounded shoulders and forward head  PALPATION:  Not tested  CERVICAL ROM:   Active ROM A/PROM (deg) eval  Flexion WFL  Extension 10%  Right lateral flexion 10%  Left lateral flexion 10%  Right rotation 50%  Left rotation 25%   (Blank rows = not tested)  UPPER EXTREMITY ROM:  Active ROM Right eval Left eval  Shoulder flexion 60d 80d  Shoulder extension    Shoulder abduction 50d 80d  Shoulder adduction    Shoulder extension    Shoulder internal rotation    Shoulder external rotation    Elbow flexion    Elbow extension    Wrist flexion    Wrist extension    Wrist ulnar deviation    Wrist radial deviation    Wrist pronation    Wrist supination     (Blank rows = not tested)  UPPER EXTREMITY MMT:  MMT Right eval Left eval  Shoulder flexion 2 3  Shoulder extension 3 4  Shoulder abduction 2 3  Shoulder adduction    Shoulder extension    Shoulder internal rotation    Shoulder external rotation    Middle trapezius    Lower trapezius    Elbow flexion 4 4  Elbow extension 4 4  Wrist flexion    Wrist extension    Wrist ulnar deviation    Wrist radial deviation    Wrist pronation    Wrist supination    Grip strength 24 34  Key Pinch 6 8   (Blank rows = not tested)  CERVICAL SPECIAL TESTS:   deferred  FUNCTIONAL TESTS:  30 seconds chair stand test 1  TREATMENT:    [facility] Adult PT Treatment:                                                DATE: 03/15/24 Therapeutic Exercise: NuStep level 4 x 8' for activity tolerance  Seated scapular retraction 3 hold x10 Seated elbow flex/ext AROM 2x15 Reclined table: Supine reciprocal OH flexion with marching 2x15 ea Supine chest press with dowel 1x30  Flexion AAROM hands clasped x10   [facility] Adult PT Treatment:                                                DATE: 03/08/24 Therapeutic Exercise: NuStep level 4 x 8' for activity tolerance  Seated elbow flex/ext AROM 2x15 Supine reciprocal OH flexion with marching 2x15 ea Supine chest  press with dowel 1x30  Seated scapular retraction x10   [facility] Adult PT Treatment:                                                DATE: 03/01/2024 Therapeutic Activity: NuStep 6' for activity tolerance  Standing flexion with UE ranger (set at 20) 1x20 Supine reciprocal OH flexion with marching 2x15 ea Supine chest press with UE  ranger 1x30  Supine ABD with dowel 1x20   PATIENT EDUCATION:  Education details: Discussed eval findings, rehab rationale and POC and patient is in agreement  Person educated: Patient Education method: Explanation and Handouts Education comprehension: verbalized understanding and needs further education  HOME EXERCISE PROGRAM: Access Code: [email or web address] URL: [email or web address] Date: 02/21/2024 Prepared by: Reyes Kohut  Exercises - Sit to Stand with Armchair  - 1-2 x daily - 5 x weekly - 1 sets - 5 reps - Seated Heel Toe Raises  - 1-2 x daily - 5 x weekly - 2 sets - 10 reps - Seated Long Arc Quad  - 1-2 x daily - 5 x weekly - 2 sets - 10 reps - Seated Heel Slide  - 1-2 x daily - 5 x weekly - 2 sets - 10 reps  ASSESSMENT:  CLINICAL IMPRESSION: Patient presents to PT reporting minimal pain currently, is still waiting on her MRI results. Session  today continued to focus on UE strengthening in reclined position as she is unable to get supine due to COPD. She is still unable to perform shoulder flexion or abduction against gravity. Patient was able to tolerate all prescribed exercises with no adverse effects. Patient continues to benefit from skilled PT services and should be progressed as able to improve functional independence.   EVAL: Patient is a 78 y.o. female who was seen today for physical therapy evaluation and treatment for neck pain and B UE strength deficits.  Patient has a history of 2 cervical fusions.  She adopts a forward flexed posture and cannot stand erect due to anterior thoracic pain.  30s chair stand test limited to 1 rep.  Grip and pinch strengths functional B, elbow ROM and strength functional B but patient unable to flex or abduct OH due to weakness.  PROM of B shoulders functional in flexion and abduction.  Patient goals include increasing her mobility status and is scheduled for further imaging in cervical region to address progressive weakness in B shoulders.  OBJECTIVE IMPAIRMENTS: Abnormal gait, decreased activity tolerance, decreased balance, decreased knowledge of condition, decreased mobility, difficulty walking, decreased ROM, decreased strength, improper body mechanics, and postural dysfunction.   ACTIVITY LIMITATIONS: carrying, lifting, standing, stairs, transfers, and bed mobility  PERSONAL FACTORS: Age, Fitness, Past/current experiences, Time since onset of injury/illness/exacerbation, and 1 comorbidity: cervical fusions are also affecting patient's functional outcome.   REHAB POTENTIAL: Fair based on chronicity, progressive degenerative changes and previous cervical fusions  CLINICAL DECISION MAKING: Evolving/moderate complexity  EVALUATION COMPLEXITY: Low   GOALS: Goals reviewed with patient? No    SHORT TERM GOALS=LONG TERM GOALS: Target date: 04/22/24  Patient to demonstrate independence in HEP   Baseline: [id number] Goal status: INITIAL  2.  Patient will increase 30s chair stand reps from 1 to 4 with arms to demonstrate and improved functional ability with less pain/difficulty as well as reduce fall risk.  Baseline: 1 Goal status: INITIAL  3.  Patient will score at least 17/50 on ODI to signify clinically meaningful improvement in functional abilities.   Baseline: 25/50 Goal status: INITIAL  4.  Increase AROM B shoulders to 90d flexion and abduction Baseline:  Active ROM Right eval Left eval  Shoulder flexion 60d 80d  Shoulder extension    Shoulder abduction 50d 80d   Goal status: INITIAL  5.  Assess 2 MWT and establish baseline Baseline: TBD Goal status: INITIAL    PLAN:  PT FREQUENCY: 1x/week  PT DURATION: 6 weeks  PLANNED INTERVENTIONS: 97110-Therapeutic exercises, 97530- Therapeutic activity, 97112- Neuromuscular re-education, 97535- Self Care, [id number]- Manual therapy, [phone]- Gait training, and Patient/Family education  PLAN FOR NEXT SESSION: HEP review and update, manual techniques as appropriate, aerobic tasks, ROM and flexibility activities, strengthening and PREs, TPDN, gait and balance training as needed     Corean Pouch PTA  03/15/2024, 2:41 PM   Referring diagnosis? M47.12 (ICD-10-CM) - Other spondylosis with myelopathy, cervical region Treatment diagnosis? (if different than referring diagnosis) M47.12 (ICD-10-CM) - Other spondylosis with myelopathy, cervical region What was this (referring dx) caused by? [x]  Surgery []  Fall []  Ongoing issue []  Arthritis []  Other: ____________  Laterality: []  Rt []  Lt [x]  Both  Check all possible CPT codes:  *CHOOSE 10 OR LESS*    See Planned Interventions listed in the Plan section of the Evaluation.

## 2024-03-21 NOTE — Therapy (Unsigned)
 OUTPATIENT PHYSICAL THERAPY TREATMENT NOTE   Patient Name: [name] MRN: [id number] DOB:02-19-1946, 78 y.o., female Today's Date: 03/22/2024  END OF SESSION:  PT End of Session - 03/22/24 1428     Visit Number 5    Number of Visits 6    Date for PT Re-Evaluation 04/22/24    Authorization Type [employer] MCR    PT Start Time 1400    PT Stop Time 1438    PT Time Calculation (min) 38 min    Activity Tolerance Patient tolerated treatment well    Behavior During Therapy WFL for tasks assessed/performed            Past Medical History:  Diagnosis Date   Abdominal pain    around naval   Anxiety    Arthritis    Bipolar disorder (HCC)    Breast pain in female    Bruises easily    Cancer (HCC) 2003   left lung   Cervical disc disease    Chronic kidney disease    COPD (chronic obstructive pulmonary disease) (HCC)    O2 as needed.   Depression    DJD (degenerative joint disease)    Dyspnea    uses O2 via [place] 2-3L PRN   Gum disease    Hernia    Hypoglycemic episode in patient with diabetes mellitus (HCC) 2021   Diet controlled diabetes with occasional hypoglycemia at night   Hypothyroidism    no meds   IDA (iron deficiency anemia) 03/27/2021   in [place]   Insomnia    not currently a problem   Obstructive sleep apnea 08/06/2013   Phlebitis    Pneumonia 03/2022   mild case   Pre-diabetes    diet controlled, no meds   Thyroid  disease    Walker as ambulation aid    Wears glasses    Weight increase    Past Surgical History:  Procedure Laterality Date   ABDOMINAL HYSTERECTOMY     partial   ANTERIOR CERVICAL DECOMP/DISCECTOMY FUSION N/A 06/05/2022   Procedure: C3-4 ANTERIOR CERVICAL DISCECTOMY FUSION, ALLOGRAFT, PLATE;  Surgeon: Barbarann Oneil BROCKS, MD;  Location: [facility] OR;  Service: Orthopedics;  Laterality: N/A;   CATARACT EXTRACTION  04/2022   FOOT SURGERY     right   HERNIA REPAIR     LAPAROSCOPIC GASTRIC BANDING  08/2008   LAPAROSCOPIC ROUX-EN-Y GASTRIC BYPASS  WITH UPPER ENDOSCOPY AND REMOVAL OF LAP BAND  2018   LEFT HEART CATHETERIZATION WITH CORONARY ANGIOGRAM N/A 05/02/2012   Procedure: LEFT HEART CATHETERIZATION WITH CORONARY ANGIOGRAM;  Surgeon: Erick JONELLE Bergamo, MD;  Location: [facility] CATH LAB;  Service: Cardiovascular;  Laterality: N/A;   LUNG LOBECTOMY  2003   left   MASS EXCISION Right 06/06/2020   Procedure: REMOVAL MASS ON RIGHT BUTTOCK;  Surgeon: Sheldon Standing, MD;  Location: [facility] ORS;  Service: General;  Laterality: Right;   SPINE SURGERY     fusion on the neck   THYROIDECTOMY     partial   TONSILLECTOMY     VENTRAL HERNIA REPAIR N/A 06/06/2020   Procedure: LAPAROSCOPIC INCARCERATED INCISIONAL HERNIA REPAIR WITH MESH AND TAP BLOCK;  Surgeon: Sheldon Standing, MD;  Location: [facility] ORS;  Service: General;  Laterality: N/A;   Patient Active Problem List   Diagnosis Date Noted   Rib pain on left side 11/24/2022   Chronic back pain 11/24/2022   Depression 11/24/2022   Costochondritis 10/30/2022   Impingement syndrome of right shoulder 09/22/2022   S/P cervical spinal  fusion 06/21/2022   Cervical stenosis of spine 06/05/2022   Sinusitis, acute maxillary 03/31/2022   Left lower lobe pneumonia 03/31/2022   Change in bowel habit 11/24/2021   Epigastric pain 11/24/2021   Internal hemorrhoids 11/24/2021   History of colonic polyps 11/24/2021   Urticaria 11/24/2021   Spinal stenosis of cervical region 11/05/2021   Pincer nail deformity 09/05/2021   History of diabetes mellitus 03/20/2021   Iron deficiency 03/20/2021   Pruritus 01/09/2021   Pain in left leg 12/23/2020   Prediabetes 11/18/2020   Incarcerated incisional hernia s/p lap repair w mesh 06/06/2020 06/06/2020   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   Polyp of [place] 10/23/2019   Chronic respiratory failure with hypoxia (HCC) 04/01/2019   Medication management 02/28/2019   Combined forms of age-related cataract of both eyes 12/01/2018   Early stage nonexudative age-related  macular degeneration of both eyes 12/01/2018   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   PVD (posterior vitreous detachment), left 11/28/2018   Dysphagia 12/30/2017   Asthma 04/12/2017   BMI 40.0-44.9, adult (HCC) 04/12/2017   Irritable bowel syndrome 04/12/2017   Osteopenia 04/12/2017   Umbilical hernia without obstruction and without gangrene 03/18/2017   Hiatal hernia 01/26/2017   Vitamin D deficiency 06/16/2016   Encounter for preoperative examination for general surgical procedure 03/06/2016   Hypercholesterolemia 02/25/2016   Hypertensive disorder 02/25/2016   Cough 08/02/2015   Obstructive sleep apnea 08/06/2013   Neoplasm of breast 07/20/2013   COPD with acute bronchitis (HCC) 06/18/2013   Myalgia 06/18/2013   History of laparoscopic adjustable gastric banding, APS. 09/04/2008. 04/08/2011   History of lung cancer 06/27/2010   SKIN RASH 12/13/2009   DYSPNEA 07/05/2008   DIABETES, TYPE 2 07/01/2008   Obesity, morbid (HCC) 07/01/2008   Type 2 diabetes mellitus without complications (HCC) 07/01/2008   COPD mixed type (HCC) 06/26/2008   Malignant neoplasm of lung (HCC) 07/20/2001    PCP: Alys Schuyler HERO, PA   REFERRING PROVIDER: Debby Dorn MATSU, MD  REFERRING DIAG: [phone] (ICD-10-CM) - Other spondylosis with myelopathy, cervical region  THERAPY DIAG:  Cervicalgia  Cervical disc disease with myelopathy  Abnormal posture  Rationale for Evaluation and Treatment: Rehabilitation  ONSET DATE: chronic  SUBJECTIVE:                                                                                                                                                                                                         SUBJECTIVE STATEMENT: Continues with minimal c/o pain.  Overall mobility remains unchanged.   Had  MRI and is waiting for MD appointment to review.   Hand dominance: Right  PERTINENT HISTORY:    PAIN:  Are you having pain? Yes: NPRS scale: 3-4/10 Pain  location: neck Pain description: sore Aggravating factors: activity involving  Relieving factors: heat  PRECAUTIONS: Other: cervical fusion C3-6  RED FLAGS: S&S of myelopathy  WEIGHT BEARING RESTRICTIONS: No  FALLS:  Has patient fallen in last 6 months? 2 in 6 weeks  LIVING ENVIRONMENT: Lives with: lives with their family Lives in: House/apartment Stairs: No Has following equipment at home: Environmental consultant - 4 wheeled  OCCUPATION: retired  PLOF: Independent  PATIENT GOALS: to get more steady on my feet  NEXT MD VISIT: TBD  OBJECTIVE:  Note: Objective measures were completed at Evaluation unless otherwise noted.  DIAGNOSTIC FINDINGS:    PATIENT SURVEYS:  NDI: 25/50 50% perceived disability  POSTURE: rounded shoulders and forward head  PALPATION:  Not tested  CERVICAL ROM:   Active ROM A/PROM (deg) eval  Flexion WFL  Extension 10%  Right lateral flexion 10%  Left lateral flexion 10%  Right rotation 50%  Left rotation 25%   (Blank rows = not tested)  UPPER EXTREMITY ROM:  Active ROM Right eval Left eval  Shoulder flexion 60d 80d  Shoulder extension    Shoulder abduction 50d 80d  Shoulder adduction    Shoulder extension    Shoulder internal rotation    Shoulder external rotation    Elbow flexion    Elbow extension    Wrist flexion    Wrist extension    Wrist ulnar deviation    Wrist radial deviation    Wrist pronation    Wrist supination     (Blank rows = not tested)  UPPER EXTREMITY MMT:  MMT Right eval Left eval  Shoulder flexion 2 3  Shoulder extension 3 4  Shoulder abduction 2 3  Shoulder adduction    Shoulder extension    Shoulder internal rotation    Shoulder external rotation    Middle trapezius    Lower trapezius    Elbow flexion 4 4  Elbow extension 4 4  Wrist flexion    Wrist extension    Wrist ulnar deviation    Wrist radial deviation    Wrist pronation    Wrist supination    Grip strength 24 34  Key Pinch 6 8   (Blank  rows = not tested)  CERVICAL SPECIAL TESTS:  deferred  FUNCTIONAL TESTS:  30 seconds chair stand test 1  TREATMENT:    [facility] Adult PT Treatment:                                                DATE: 03/22/24 Therapeutic Exercise: Nustep L4 8 min Neuromuscular re-ed: ER YTB 15x Seated bicep curl YTB 15x Therapeutic Activity: Supine hip fallouts RTB 15x B, 15/15 SL Supine march 15/15  [facility] Adult PT Treatment:                                                DATE: 03/15/24 Therapeutic Exercise: NuStep level 4 x 8' for activity tolerance  Seated scapular retraction 3 hold x10 Seated elbow flex/ext AROM 2x15 Reclined table: Supine reciprocal OH flexion with  marching 2x15 ea Supine chest press with dowel 1x30  Flexion AAROM hands clasped x10   [facility] Adult PT Treatment:                                                DATE: 03/08/24 Therapeutic Exercise: NuStep level 4 x 8' for activity tolerance  Seated elbow flex/ext AROM 2x15 Supine reciprocal OH flexion with marching 2x15 ea Supine chest press with dowel 1x30  Seated scapular retraction x10   [facility] Adult PT Treatment:                                                DATE: 03/01/2024 Therapeutic Activity: NuStep 6' for activity tolerance  Standing flexion with UE ranger (set at 20) 1x20 Supine reciprocal OH flexion with marching 2x15 ea Supine chest press with UE  ranger 1x30  Supine ABD with dowel 1x20   PATIENT EDUCATION:  Education details: Discussed eval findings, rehab rationale and POC and patient is in agreement  Person educated: Patient Education method: Explanation and Handouts Education comprehension: verbalized understanding and needs further education  HOME EXERCISE PROGRAM: Access Code: [email or web address] URL: [email or web address] Date: 02/21/2024 Prepared by: Reyes Kohut  Exercises - Sit to Stand with Armchair  - 1-2 x daily - 5 x weekly - 1 sets - 5 reps - Seated Heel Toe Raises  - 1-2 x  daily - 5 x weekly - 2 sets - 10 reps - Seated Long Arc Quad  - 1-2 x daily - 5 x weekly - 2 sets - 10 reps - Seated Heel Slide  - 1-2 x daily - 5 x weekly - 2 sets - 10 reps  ASSESSMENT:  CLINICAL IMPRESSION: Focus of session was strength and stabilization of proximal hip and shoulder stabilizers.  Used resisted bands for hip and shoulder tasks.  Able to complete all requested tasks w/o setback.   EVAL: Patient is a 78 y.o. female who was seen today for physical therapy evaluation and treatment for neck pain and B UE strength deficits.  Patient has a history of 2 cervical fusions.  She adopts a forward flexed posture and cannot stand erect due to anterior thoracic pain.  30s chair stand test limited to 1 rep.  Grip and pinch strengths functional B, elbow ROM and strength functional B but patient unable to flex or abduct OH due to weakness.  PROM of B shoulders functional in flexion and abduction.  Patient goals include increasing her mobility status and is scheduled for further imaging in cervical region to address progressive weakness in B shoulders.  OBJECTIVE IMPAIRMENTS: Abnormal gait, decreased activity tolerance, decreased balance, decreased knowledge of condition, decreased mobility, difficulty walking, decreased ROM, decreased strength, improper body mechanics, and postural dysfunction.   ACTIVITY LIMITATIONS: carrying, lifting, standing, stairs, transfers, and bed mobility  PERSONAL FACTORS: Age, Fitness, Past/current experiences, Time since onset of injury/illness/exacerbation, and 1 comorbidity: cervical fusions are also affecting patient's functional outcome.   REHAB POTENTIAL: Fair based on chronicity, progressive degenerative changes and previous cervical fusions  CLINICAL DECISION MAKING: Evolving/moderate complexity  EVALUATION COMPLEXITY: Low   GOALS: Goals reviewed with patient? No    SHORT TERM GOALS=LONG TERM GOALS: Target date: 04/22/24  Patient to demonstrate  independence in HEP  Baseline: [id number] Goal status: INITIAL  2.  Patient will increase 30s chair stand reps from 1 to 4 with arms to demonstrate and improved functional ability with less pain/difficulty as well as reduce fall risk.  Baseline: 1 Goal status: INITIAL  3.  Patient will score at least 17/50 on ODI to signify clinically meaningful improvement in functional abilities.   Baseline: 25/50 Goal status: INITIAL  4.  Increase AROM B shoulders to 90d flexion and abduction Baseline:  Active ROM Right eval Left eval  Shoulder flexion 60d 80d  Shoulder extension    Shoulder abduction 50d 80d   Goal status: INITIAL  5.  Assess 2 MWT and establish baseline Baseline: TBD Goal status: INITIAL    PLAN:  PT FREQUENCY: 1x/week  PT DURATION: 6 weeks  PLANNED INTERVENTIONS: [id number]-Therapeutic exercises, 97530- Therapeutic activity, [id number]- Neuromuscular re-education, 97535- Self Care, [id number]- Manual therapy, [phone]- Gait training, and Patient/Family education  PLAN FOR NEXT SESSION: HEP review and update, manual techniques as appropriate, aerobic tasks, ROM and flexibility activities, strengthening and PREs, TPDN, gait and balance training as needed     Reyes CHRISTELLA Kohut PT  03/22/2024, 2:29 PM   Referring diagnosis? M47.12 (ICD-10-CM) - Other spondylosis with myelopathy, cervical region Treatment diagnosis? (if different than referring diagnosis) M47.12 (ICD-10-CM) - Other spondylosis with myelopathy, cervical region What was this (referring dx) caused by? [x]  Surgery []  Fall []  Ongoing issue []  Arthritis []  Other: ____________  Laterality: []  Rt []  Lt [x]  Both  Check all possible CPT codes:  *CHOOSE 10 OR LESS*    See Planned Interventions listed in the Plan section of the Evaluation.

## 2024-03-22 ENCOUNTER — Ambulatory Visit: Attending: Neurosurgery

## 2024-03-22 DIAGNOSIS — R293 Abnormal posture: Secondary | ICD-10-CM | POA: Diagnosis not present

## 2024-03-22 DIAGNOSIS — M542 Cervicalgia: Secondary | ICD-10-CM | POA: Diagnosis not present

## 2024-03-22 DIAGNOSIS — M5000 Cervical disc disorder with myelopathy, unspecified cervical region: Secondary | ICD-10-CM | POA: Insufficient documentation

## 2024-03-30 ENCOUNTER — Encounter

## 2024-03-30 NOTE — Therapy (Incomplete)
 OUTPATIENT PHYSICAL THERAPY TREATMENT NOTE   Patient Name: [name] MRN: [id number] DOB:1945/08/05, 78 y.o., female Today's Date: 03/30/2024  END OF SESSION:  Past Medical History:  Diagnosis Date   Abdominal pain    around naval   Anxiety    Arthritis    Bipolar disorder (HCC)    Breast pain in female    Bruises easily    Cancer (HCC) 2003   left lung   Cervical disc disease    Chronic kidney disease    COPD (chronic obstructive pulmonary disease) (HCC)    O2 as needed.   Depression    DJD (degenerative joint disease)    Dyspnea    uses O2 via [place] 2-3L PRN   Gum disease    Hernia    Hypoglycemic episode in patient with diabetes mellitus (HCC) 2021   Diet controlled diabetes with occasional hypoglycemia at night   Hypothyroidism    no meds   IDA (iron deficiency anemia) 03/27/2021   in [place]   Insomnia    not currently a problem   Obstructive sleep apnea 08/06/2013   Phlebitis    Pneumonia 03/2022   mild case   Pre-diabetes    diet controlled, no meds   Thyroid  disease    Walker as ambulation aid    Wears glasses    Weight increase    Past Surgical History:  Procedure Laterality Date   ABDOMINAL HYSTERECTOMY     partial   ANTERIOR CERVICAL DECOMP/DISCECTOMY FUSION N/A 06/05/2022   Procedure: C3-4 ANTERIOR CERVICAL DISCECTOMY FUSION, ALLOGRAFT, PLATE;  Surgeon: Barbarann Oneil BROCKS, MD;  Location: [facility] OR;  Service: Orthopedics;  Laterality: N/A;   CATARACT EXTRACTION  04/2022   FOOT SURGERY     right   HERNIA REPAIR     LAPAROSCOPIC GASTRIC BANDING  08/2008   LAPAROSCOPIC ROUX-EN-Y GASTRIC BYPASS WITH UPPER ENDOSCOPY AND REMOVAL OF LAP BAND  2018   LEFT HEART CATHETERIZATION WITH CORONARY ANGIOGRAM N/A 05/02/2012   Procedure: LEFT HEART CATHETERIZATION WITH CORONARY ANGIOGRAM;  Surgeon: Erick JONELLE Bergamo, MD;  Location: [facility] CATH LAB;  Service: Cardiovascular;  Laterality: N/A;   LUNG LOBECTOMY  2003   left   MASS EXCISION Right 06/06/2020   Procedure:  REMOVAL MASS ON RIGHT BUTTOCK;  Surgeon: Sheldon Standing, MD;  Location: [facility] ORS;  Service: General;  Laterality: Right;   SPINE SURGERY     fusion on the neck   THYROIDECTOMY     partial   TONSILLECTOMY     VENTRAL HERNIA REPAIR N/A 06/06/2020   Procedure: LAPAROSCOPIC INCARCERATED INCISIONAL HERNIA REPAIR WITH MESH AND TAP BLOCK;  Surgeon: Sheldon Standing, MD;  Location: [facility] ORS;  Service: General;  Laterality: N/A;   Patient Active Problem List   Diagnosis Date Noted   Rib pain on left side 11/24/2022   Chronic back pain 11/24/2022   Depression 11/24/2022   Costochondritis 10/30/2022   Impingement syndrome of right shoulder 09/22/2022   S/P cervical spinal fusion 06/21/2022   Cervical stenosis of spine 06/05/2022   Sinusitis, acute maxillary 03/31/2022   Left lower lobe pneumonia 03/31/2022   Change in bowel habit 11/24/2021   Epigastric pain 11/24/2021   Internal hemorrhoids 11/24/2021   History of colonic polyps 11/24/2021   Urticaria 11/24/2021   Spinal stenosis of cervical region 11/05/2021   Pincer nail deformity 09/05/2021   History of diabetes mellitus 03/20/2021   Iron deficiency 03/20/2021   Pruritus 01/09/2021   Pain in left leg 12/23/2020  Prediabetes 11/18/2020   Incarcerated incisional hernia s/p lap repair w mesh 06/06/2020 06/06/2020   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   Polyp of colon 10/23/2019   Chronic respiratory failure with hypoxia (HCC) 04/01/2019   Medication management 02/28/2019   Combined forms of age-related cataract of both eyes 12/01/2018   Early stage nonexudative age-related macular degeneration of both eyes 12/01/2018   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   PVD (posterior vitreous detachment), left 11/28/2018   Dysphagia 12/30/2017   Asthma 04/12/2017   BMI 40.0-44.9, adult (HCC) 04/12/2017   Irritable bowel syndrome 04/12/2017   Osteopenia 04/12/2017   Umbilical hernia without obstruction and without gangrene 03/18/2017    Hiatal hernia 01/26/2017   Vitamin D deficiency 06/16/2016   Encounter for preoperative examination for general surgical procedure 03/06/2016   Hypercholesterolemia 02/25/2016   Hypertensive disorder 02/25/2016   Cough 08/02/2015   Obstructive sleep apnea 08/06/2013   Neoplasm of breast 07/20/2013   COPD with acute bronchitis (HCC) 06/18/2013   Myalgia 06/18/2013   History of laparoscopic adjustable gastric banding, APS. 09/04/2008. 04/08/2011   History of lung cancer 06/27/2010   SKIN RASH 12/13/2009   DYSPNEA 07/05/2008   DIABETES, TYPE 2 07/01/2008   Obesity, morbid (HCC) 07/01/2008   Type 2 diabetes mellitus without complications (HCC) 07/01/2008   COPD mixed type (HCC) 06/26/2008   Malignant neoplasm of lung (HCC) 07/20/2001    PCP: Alys Schuyler HERO, PA   REFERRING PROVIDER: Debby Dorn MATSU, MD  REFERRING DIAG: [phone] (ICD-10-CM) - Other spondylosis with myelopathy, cervical region  THERAPY DIAG:  No diagnosis found.  Rationale for Evaluation and Treatment: Rehabilitation  ONSET DATE: chronic  SUBJECTIVE:                                                                                                                                                                                                         SUBJECTIVE STATEMENT: ***  Continues with minimal c/o pain.  Overall mobility remains unchanged.   Had MRI and is waiting for MD appointment to review.   Hand dominance: Right  PERTINENT HISTORY:    PAIN:  Are you having pain? Yes: NPRS scale: 3-4/10 Pain location: neck Pain description: sore Aggravating factors: activity involving  Relieving factors: heat  PRECAUTIONS: Other: cervical fusion C3-6  RED FLAGS: S&S of myelopathy  WEIGHT BEARING RESTRICTIONS: No  FALLS:  Has patient fallen in last 6 months? 2 in 6 weeks  LIVING ENVIRONMENT: Lives with: lives with their family Lives in: House/apartment Stairs: No Has following equipment at home:  Walker - 4 wheeled  OCCUPATION: retired  PLOF: Independent  PATIENT GOALS: to get more steady on my feet  NEXT MD VISIT: TBD  OBJECTIVE:  Note: Objective measures were completed at Evaluation unless otherwise noted.  DIAGNOSTIC FINDINGS:    PATIENT SURVEYS:  NDI: 25/50 50% perceived disability  POSTURE: rounded shoulders and forward head  PALPATION:  Not tested  CERVICAL ROM:   Active ROM A/PROM (deg) eval  Flexion WFL  Extension 10%  Right lateral flexion 10%  Left lateral flexion 10%  Right rotation 50%  Left rotation 25%   (Blank rows = not tested)  UPPER EXTREMITY ROM:  Active ROM Right eval Left eval  Shoulder flexion 60d 80d  Shoulder extension    Shoulder abduction 50d 80d  Shoulder adduction    Shoulder extension    Shoulder internal rotation    Shoulder external rotation    Elbow flexion    Elbow extension    Wrist flexion    Wrist extension    Wrist ulnar deviation    Wrist radial deviation    Wrist pronation    Wrist supination     (Blank rows = not tested)  UPPER EXTREMITY MMT:  MMT Right eval Left eval  Shoulder flexion 2 3  Shoulder extension 3 4  Shoulder abduction 2 3  Shoulder adduction    Shoulder extension    Shoulder internal rotation    Shoulder external rotation    Middle trapezius    Lower trapezius    Elbow flexion 4 4  Elbow extension 4 4  Wrist flexion    Wrist extension    Wrist ulnar deviation    Wrist radial deviation    Wrist pronation    Wrist supination    Grip strength 24 34  Key Pinch 6 8   (Blank rows = not tested)  CERVICAL SPECIAL TESTS:  deferred  FUNCTIONAL TESTS:  30 seconds chair stand test 1  TREATMENT:    [facility] Adult PT Treatment:                                                DATE: 03/30/24 Therapeutic Exercise: Nustep L4 8 min Neuromuscular re-ed: ER YTB 15x Seated bicep curl YTB 15x Therapeutic Activity: Supine hip fallouts RTB 15x B, 15/15 SL Supine march 15/15  [facility] Adult  PT Treatment:                                                DATE: 03/22/24 Therapeutic Exercise: Nustep L4 8 min Neuromuscular re-ed: ER YTB 15x Seated bicep curl YTB 15x Therapeutic Activity: Supine hip fallouts RTB 15x B, 15/15 SL Supine march 15/15  [facility] Adult PT Treatment:                                                DATE: 03/15/24 Therapeutic Exercise: NuStep level 4 x 8' for activity tolerance  Seated scapular retraction 3 hold x10 Seated elbow flex/ext AROM 2x15 Reclined table: Supine reciprocal OH flexion with marching 2x15 ea Supine chest press with dowel 1x30  Flexion AAROM  hands clasped x10    PATIENT EDUCATION:  Education details: Discussed eval findings, rehab rationale and POC and patient is in agreement  Person educated: Patient Education method: Explanation and Handouts Education comprehension: verbalized understanding and needs further education  HOME EXERCISE PROGRAM: Access Code: [email or web address] URL: [email or web address] Date: 02/21/2024 Prepared by: Reyes Kohut  Exercises - Sit to Stand with Armchair  - 1-2 x daily - 5 x weekly - 1 sets - 5 reps - Seated Heel Toe Raises  - 1-2 x daily - 5 x weekly - 2 sets - 10 reps - Seated Long Arc Quad  - 1-2 x daily - 5 x weekly - 2 sets - 10 reps - Seated Heel Slide  - 1-2 x daily - 5 x weekly - 2 sets - 10 reps  ASSESSMENT:  CLINICAL IMPRESSION:  ***  Focus of session was strength and stabilization of proximal hip and shoulder stabilizers.  Used resisted bands for hip and shoulder tasks.  Able to complete all requested tasks w/o setback.   EVAL: Patient is a 78 y.o. female who was seen today for physical therapy evaluation and treatment for neck pain and B UE strength deficits.  Patient has a history of 2 cervical fusions.  She adopts a forward flexed posture and cannot stand erect due to anterior thoracic pain.  30s chair stand test limited to 1 rep.  Grip and pinch strengths functional B, elbow  ROM and strength functional B but patient unable to flex or abduct OH due to weakness.  PROM of B shoulders functional in flexion and abduction.  Patient goals include increasing her mobility status and is scheduled for further imaging in cervical region to address progressive weakness in B shoulders.  OBJECTIVE IMPAIRMENTS: Abnormal gait, decreased activity tolerance, decreased balance, decreased knowledge of condition, decreased mobility, difficulty walking, decreased ROM, decreased strength, improper body mechanics, and postural dysfunction.   ACTIVITY LIMITATIONS: carrying, lifting, standing, stairs, transfers, and bed mobility  PERSONAL FACTORS: Age, Fitness, Past/current experiences, Time since onset of injury/illness/exacerbation, and 1 comorbidity: cervical fusions are also affecting patient's functional outcome.   REHAB POTENTIAL: Fair based on chronicity, progressive degenerative changes and previous cervical fusions  CLINICAL DECISION MAKING: Evolving/moderate complexity  EVALUATION COMPLEXITY: Low   GOALS: Goals reviewed with patient? No    SHORT TERM GOALS=LONG TERM GOALS: Target date: 04/22/24  Patient to demonstrate independence in HEP  Baseline: [id number] Goal status: INITIAL  2.  Patient will increase 30s chair stand reps from 1 to 4 with arms to demonstrate and improved functional ability with less pain/difficulty as well as reduce fall risk.  Baseline: 1 Goal status: INITIAL  3.  Patient will score at least 17/50 on ODI to signify clinically meaningful improvement in functional abilities.   Baseline: 25/50 Goal status: INITIAL  4.  Increase AROM B shoulders to 90d flexion and abduction Baseline:  Active ROM Right eval Left eval  Shoulder flexion 60d 80d  Shoulder extension    Shoulder abduction 50d 80d   Goal status: INITIAL  5.  Assess 2 MWT and establish baseline Baseline: TBD Goal status: INITIAL    PLAN:  PT FREQUENCY: 1x/week  PT DURATION:  6 weeks  PLANNED INTERVENTIONS: 97110-Therapeutic exercises, 97530- Therapeutic activity, [id number]- Neuromuscular re-education, 97535- Self Care, [id number]- Manual therapy, [phone]- Gait training, and Patient/Family education  PLAN FOR NEXT SESSION: HEP review and update, manual techniques as appropriate, aerobic tasks, ROM and flexibility activities, strengthening and PREs, TPDN, gait and balance training as needed  Corean Pouch PTA  03/30/2024, 7:18 AM   Referring diagnosis? M47.12 (ICD-10-CM) - Other spondylosis with myelopathy, cervical region Treatment diagnosis? (if different than referring diagnosis) M47.12 (ICD-10-CM) - Other spondylosis with myelopathy, cervical region What was this (referring dx) caused by? [x]  Surgery []  Fall []  Ongoing issue []  Arthritis []  Other: ____________  Laterality: []  Rt []  Lt [x]  Both  Check all possible CPT codes:  *CHOOSE 10 OR LESS*    See Planned Interventions listed in the Plan section of the Evaluation.

## 2024-04-03 DIAGNOSIS — J9611 Chronic respiratory failure with hypoxia: Secondary | ICD-10-CM | POA: Diagnosis not present

## 2024-04-03 DIAGNOSIS — J449 Chronic obstructive pulmonary disease, unspecified: Secondary | ICD-10-CM | POA: Diagnosis not present

## 2024-04-07 DIAGNOSIS — M4712 Other spondylosis with myelopathy, cervical region: Secondary | ICD-10-CM | POA: Diagnosis not present

## 2024-04-10 DIAGNOSIS — D4101 Neoplasm of uncertain behavior of right kidney: Secondary | ICD-10-CM | POA: Diagnosis not present

## 2024-04-10 DIAGNOSIS — D49512 Neoplasm of unspecified behavior of left kidney: Secondary | ICD-10-CM | POA: Diagnosis not present

## 2024-04-10 DIAGNOSIS — D49511 Neoplasm of unspecified behavior of right kidney: Secondary | ICD-10-CM | POA: Diagnosis not present

## 2024-04-10 DIAGNOSIS — D4102 Neoplasm of uncertain behavior of left kidney: Secondary | ICD-10-CM | POA: Diagnosis not present

## 2024-04-11 ENCOUNTER — Other Ambulatory Visit: Payer: Self-pay | Admitting: Internal Medicine

## 2024-04-14 NOTE — Telephone Encounter (Signed)
 Received refill request for Stiolto 2.5  from [facility].  Patient's last OV 08/30/2023 Dr. Neysa. Per chart note refill encounter note from 10/06/2023 by Dr. Neysa:   Neysa Reggy BIRCH, MD    10/07/23  1:52 PM Note I sent refill for Stiolto inhaler. I think the Trelegy was just a sample.     Return in about 6 months (around 02/27/2024).    Will okay refill on Stiolto x 2 refills.  Patient will need OV.

## 2024-04-19 ENCOUNTER — Ambulatory Visit: Admitting: Nurse Practitioner

## 2024-04-19 ENCOUNTER — Encounter: Payer: Self-pay | Admitting: Nurse Practitioner

## 2024-04-19 VITALS — BP 120/82 | HR 58 | Temp 98.1°F | Ht 60.0 in | Wt 158.6 lb

## 2024-04-19 DIAGNOSIS — J9611 Chronic respiratory failure with hypoxia: Secondary | ICD-10-CM

## 2024-04-19 DIAGNOSIS — J449 Chronic obstructive pulmonary disease, unspecified: Secondary | ICD-10-CM | POA: Diagnosis not present

## 2024-04-19 DIAGNOSIS — C3432 Malignant neoplasm of lower lobe, left bronchus or lung: Secondary | ICD-10-CM

## 2024-04-19 DIAGNOSIS — Z85118 Personal history of other malignant neoplasm of bronchus and lung: Secondary | ICD-10-CM

## 2024-04-19 DIAGNOSIS — Z23 Encounter for immunization: Secondary | ICD-10-CM

## 2024-04-19 MED ORDER — UMECLIDINIUM-VILANTEROL 62.5-25 MCG/ACT IN AEPB: 1.0000 | INHALATION_SPRAY | Freq: Every day | RESPIRATORY_TRACT | 5 refills | Status: DC

## 2024-04-19 NOTE — Patient Instructions (Addendum)
 Continue Albuterol  inhaler 2 puffs or 3 mL neb every 6 hours as needed for shortness of breath or wheezing. Notify if symptoms persist despite rescue inhaler/neb use.  Continue cetirizine 1 tab daily Switch Stiolto to Anoro 1 puff daily  Continue supplemental oxygen  2 lpm for goal oxygen  >88-90%  Flu shot today  Pneumonia shot next year since it will be 10 years    Follow up with Dr. Neysa in 6 months. If symptoms do not improve or worsen, please contact office for sooner follow up or seek emergency care.

## 2024-04-19 NOTE — Progress Notes (Signed)
 "  @Patient  ID: [name], female    DOB: 08-May-1946, 78 y.o.   MRN: [id number]  Chief Complaint  Patient presents with   Follow-up    6 month follow up    Referring provider: Alys Schuyler HERO, PA  HPI: 78 year old female, former smoker followed for COPD mixed type and chronic respiratory failure with hypoxia on supplemental oxygen .  She is a patient of Dr. Saundra and last seen in office 08/30/2023.  Past medical history significant for hypertension, IBS, diabetes, obesity, HLD.  She has a history of lung cancer status post left lower lobectomy in 2003.  TEST/EVENTS:  05/03/2017 PFT: FVC 144, FEV1 105, ratio 59, TLC 104, DLCO 59. 05/13/2022 echo: EF 60 to 65%.  RV size and function normal.  Normal diastolic filling parameters.  No significant valvular heart disease. 05/14/2022 CTA chest: No evidence of PE.  No LAD.  Postsurgical changes in the left lower lobe.  High-grade right upper lobe and left lower lobe cystic emphysematous changes again seen.  Interstitial thickening and curvilinear scarring within the postsurgical left lung.  No suspicious pulmonary nodules. 11/04/2022 echo: EF 60-65%. Normal diastolic filling pattern. No evidence of PH.  12/10/2022 CT chest wo con: prior left lobectomy. Remaining airways patent. Severe centrilobular emphysema. Hyperdense exophytic lesion of mid region of the kidney, measuring 5 mm. Partially visualized indeterminate calcified lesion of kidney measuring 2.1 cm. Atherosclerosis.   08/30/2023: OV with Dr. Neysa. Last seen November 2024 by Dr. Theophilus for R Lobar pna. Uses oxygen  at home for sleep, periodically during the day. If O2 levels drop below 92%, she uses her oxygen . Reports using oxygen  more frequently than in the past. Uses a nebulizer machin, Trelegy. Minimal coughing, phlegm production. She had f/u CXR in January that was clear.   04/19/2024: Today - follow up Discussed the use of AI scribe software for clinical note transcription  with the patient, who gave verbal consent to proceed.  History of Present Illness [name] is a 78 year old female who presents for a pulmonary follow-up.  She has not experienced a recurrence of pneumonia since her last episode in November. She reports that she now lives alone, which she feels has helped maintain her health and reduce illness. No recent respiratory issues have required the use of albuterol . Breathing feels stable. No cough, wheezing, chest congestion.   She reports difficulty walking due to neck issues, which she attributes to narrowing in the neck and previous surgery. She has a plate in her neck. Follows with neurosurgery. No worsening symptoms.   She has difficulty using her current inhaler, Stiolto, due to weakness in her hands, with significant loss of function in one hand. Wants to know if there's another option for her to use.   She requests a refill for her cough medication and has not yet received her flu shot for the year.  No increased O2 requirement.     Allergies  Allergen Reactions   Dust Mite Extract Itching   Amoxicillin      Tongue swelling   Clavulanic Acid    Grass Pollen(K-O-R-T-Swt Vern) Other (See Comments)    Other Reaction(s): Not available    Immunization History  Administered Date(s) Administered    sv, Bivalent, Protein Subunit Rsvpref,pf (Abrysvo) 07/29/2023   DT (Pediatric) 05/30/2012   Fluad Quad(high Dose 65+) 03/24/2019, 03/26/2020, 03/31/2022   Fluad Trivalent(High Dose 65+) 05/28/2023   Fluzone Influenza virus vaccine,trivalent (IIV3), split virus 03/24/2019   H1N1 06/26/2008  INFLUENZA, HIGH DOSE SEASONAL PF 05/03/2017, 04/29/2018, 05/15/2021, 04/19/2024   Influenza Split 05/03/2009, 05/11/2011, 05/24/2012, 04/19/2013, 05/15/2014   Influenza Whole 05/03/2009   Influenza, Seasonal, Injecte, Preservative Fre 05/15/2014   Influenza-Unspecified 05/11/2011, 05/24/2012, 04/19/2013, 05/15/2014, 05/21/2015, 03/24/2019,  03/26/2020   Novel Infuenza-h1n1-09 06/26/2008   PFIZER Comirnaty(Gray Top)Covid-19 Tri-Sucrose Vaccine 08/26/2019, 09/16/2019, 03/26/2020, 12/10/2020   PFIZER(Purple Top)SARS-COV-2 Vaccination 08/26/2019, 09/16/2019, 03/26/2020   Pfizer Covid-19 Vaccine Bivalent Booster 50yrs & up 05/15/2021   Pfizer(Comirnaty)Fall Seasonal Vaccine 12 years and older 09/04/2022, 06/21/2023   Pneumococcal Conjugate-13 07/04/2015   Pneumococcal Polysaccharide-23 01/03/2004   Tdap 03/21/2021   Varicella 06/29/2012, 08/04/2012   Zoster Recombinant(Shingrix) 03/21/2021, 11/18/2021   Zoster, Live 03/21/2021, 11/18/2021    Past Medical History:  Diagnosis Date   Abdominal pain    around naval   Anxiety    Arthritis    Bipolar disorder (HCC)    Breast pain in female    Bruises easily    Cancer (HCC) 2003   left lung   Cervical disc disease    Chronic kidney disease    COPD (chronic obstructive pulmonary disease) (HCC)    O2 as needed.   Depression    DJD (degenerative joint disease)    Dyspnea    uses O2 via [place] 2-3L PRN   Gum disease    Hernia    Hypoglycemic episode in patient with diabetes mellitus (HCC) 2021   Diet controlled diabetes with occasional hypoglycemia at night   Hypothyroidism    no meds   IDA (iron deficiency anemia) 03/27/2021   in [place]   Insomnia    not currently a problem   Obstructive sleep apnea 08/06/2013   Phlebitis    Pneumonia 03/2022   mild case   Pre-diabetes    diet controlled, no meds   Thyroid  disease    Walker as ambulation aid    Wears glasses    Weight increase     Tobacco History: Social History   Tobacco Use  Smoking Status Former   Current packs/day: 0.00   Average packs/day: 2.0 packs/day for 37.0 years (74.0 ttl pk-yrs)   Types: Cigarettes   Start date: 06/21/1965   Quit date: 06/21/2002   Years since quitting: 21.8  Smokeless Tobacco Never   Counseling given: Not Answered   Outpatient Medications Prior to Visit  Medication Sig  Dispense Refill   acetaminophen  (TYLENOL ) 325 MG tablet Take 2 tablets (650 mg total) by mouth every 6 (six) hours as needed. 100 tablet 0   albuterol  (PROVENTIL ) (2.5 MG/3ML) 0.083% nebulizer solution Take 3 mLs (2.5 mg total) by nebulization every 8 (eight) hours as needed for wheezing or shortness of breath. USE 1 VIAL IN NEBULIZER EVERY 8 HOURS AS NEEDED FOR WHEEZING OR SHORTNESS OF BREATH 375 mL 0   albuterol  (VENTOLIN  HFA) 108 (90 Base) MCG/ACT inhaler Inhale 2 puffs into the lungs every 4 (four) hours as needed for wheezing or shortness of breath. 24 g 1   cetirizine (ZYRTEC) 10 MG tablet Take 10 mg by mouth daily.     [facility] ASPIRIN  LOW DOSE 81 MG tablet TAKE 1 TABLET (81 MG TOTAL) BY MOUTH DAILY. SWALLOW WHOLE. 90 tablet 3   diphenhydrAMINE  (BENADRYL ) 12.5 MG/5ML liquid 25 mg every 6 (six) hours as needed for allergies or itching.     DULoxetine (CYMBALTA) 60 MG capsule Take 60 mg by mouth daily.     FREESTYLE INSULINX TEST test strip AS DIRECTED TO OBTAIN BLOOD TWICE A DAY AS NEEDED DX  CODE E11.69 90 DAYS     furosemide  (LASIX ) 20 MG tablet Take 20 mg by mouth daily as needed for edema or fluid.     gabapentin  (NEURONTIN ) 100 MG capsule Take 3 capsules (300 mg total) by mouth 3 (three) times daily. 90 capsule 1   glucose 4 GM chewable tablet Chew 1-3 tablets by mouth as needed for low blood sugar.     ketoconazole  (NIZORAL ) 2 % cream Apply 1 Application topically daily as needed for irritation. 60 g 2   Lancets (FREESTYLE) lancets      Nebulizers (COMPRESSOR/NEBULIZER) MISC Use as directed 1 each prn   nitroGLYCERIN  (NITROSTAT ) 0.4 MG SL tablet Place 1 tablet (0.4 mg total) under the tongue every 5 (five) minutes as needed for chest pain. If you require more than two tablets five minutes apart go to the nearest ER via EMS. 30 tablet 0   Polyethyl Glycol-Propyl Glycol (SYSTANE OP) Place 1 drop into both eyes 3 (three) times daily as needed (dry eyes).     rosuvastatin  (CRESTOR ) 10 MG tablet  Take 10 mg by mouth daily.     tirzepatide (MOUNJARO) 5 MG/0.5ML Pen Inject 5 mg into the skin once a week.     trolamine salicylate (ASPERCREME) 10 % cream Apply 1 Application topically as needed for muscle pain.     HYDROCODONE -CHLORPHENIRAMINE  PO Take by mouth.     Tiotropium Bromide -Olodaterol (STIOLTO RESPIMAT ) 2.5-2.5 MCG/ACT AERS INHALE 2 PUFFS EVERY DAY 4 g 1   lidocaine  (LIDODERM ) 5 % Place 1 patch onto the skin daily. Remove & Discard patch within 12 hours or as directed by MD 30 patch 0   promethazine -dextromethorphan (PROMETHAZINE -DM) 6.25-15 MG/5ML syrup Take 5 mLs by mouth 3 (three) times daily as needed for cough. 200 mL 0   No facility-administered medications prior to visit.     Review of Systems: As above    Physical Exam:  BP 120/82   Pulse (!) 58   Temp 98.1 F (36.7 C) (Oral)   Ht 5' (1.524 m)   Wt 158 lb 9.6 oz (71.9 kg)   SpO2 97%   BMI 30.97 kg/m   GEN: Pleasant, interactive, chronically ill appearing; obese; in no acute distress. HEENT:  Normocephalic and atraumatic. PERRLA. Sclera white. Nasal turbinates pink, moist and patent bilaterally. No rhinorrhea present. Oropharynx pink and moist, without exudate or edema. No lesions, ulcerations, or postnasal drip.  NECK:  Supple w/ fair ROM. No JVD present. Normal carotid impulses w/o bruits. Thyroid  symmetrical with no goiter or nodules palpated. No lymphadenopathy.   CV: RRR, no m/r/g PULMONARY:  Unlabored, regular breathing. Clear bilaterally A&P w/o wheezes/rales/rhonchi. No accessory muscle use.  GI: BS present and normoactive. Soft, non-tender to palpation.  MSK: No tenderness, erythema, warmth. Cap refil <2 sec all extrem.  Neuro: A/Ox3. No focal deficits noted.   Skin: Warm, no lesions or rashe Psych: Normal affect and behavior. Judgement and thought content appropriate.     Lab Results:  CBC    Component Value Date/Time   WBC 6.0 05/28/2022 1423   RBC 5.74 (H) 05/28/2022 1423   HGB 15.6 (H)  05/28/2022 1423   HGB 15.0 01/31/2007 1028   HCT 49.6 (H) 05/28/2022 1423   HCT 44.6 01/31/2007 1028   PLT 187 05/28/2022 1423   PLT 214 01/31/2007 1028   MCV 86.4 05/28/2022 1423   MCV 80.1 (L) 01/31/2007 1028   MCH 27.2 05/28/2022 1423   MCHC 31.5 05/28/2022 1423   RDW 16.0 (  H) 05/28/2022 1423   RDW 15.7 (H) 01/31/2007 1028   LYMPHSABS 0.9 10/30/2020 1028   LYMPHSABS 1.0 01/31/2007 1028   MONOABS 0.7 10/30/2020 1028   MONOABS 0.5 01/31/2007 1028   EOSABS 0.1 10/30/2020 1028   EOSABS 0.2 01/31/2007 1028   BASOSABS 0.0 10/30/2020 1028   BASOSABS 0.0 01/31/2007 1028    BMET    Component Value Date/Time   NA 143 11/09/2022 1121   K 5.0 11/09/2022 1121   CL 103 11/09/2022 1121   CO2 26 11/09/2022 1121   GLUCOSE 109 (H) 11/09/2022 1121   GLUCOSE 86 03/31/2022 1513   BUN 31 (H) 11/09/2022 1121   CREATININE 1.15 (H) 11/09/2022 1121   CALCIUM  9.3 11/09/2022 1121   GFRNONAA 55 (L) 05/31/2020 1130   GFRAA >60 09/23/2016 1950    BNP No results found for: BNP   Imaging:  No results found.  Administration History     None           Latest Ref Rng & Units 05/03/2017   12:48 PM  PFT Results  FVC-Pre L 2.70   FVC-Predicted Pre % 144   FVC-Post L 2.70   FVC-Predicted Post % 144   Pre FEV1/FVC % % 56   Post FEV1/FCV % % 59   FEV1-Pre L 1.52   FEV1-Predicted Pre % 105   FEV1-Post L 1.58   DLCO uncorrected ml/min/mmHg 11.19   DLCO UNC% % 59   DLCO corrected ml/min/mmHg 11.19   DLCO COR %Predicted % 59   DLVA Predicted % 60   TLC L 4.64   TLC % Predicted % 104   RV % Predicted % 85     No results found for: NITRICOXIDE      Assessment & Plan:   COPD mixed type (HCC) Compensated on current regimen. DOE multifactorial related to COPD, deconditioning, obesity. Will trial change from Stiolto to Anoro as this will be easier for her to manage with arthritis in her hands. Action plan in place. Encouraged to work on graded exercises.  Patient  Instructions  Continue Albuterol  inhaler 2 puffs or 3 mL neb every 6 hours as needed for shortness of breath or wheezing. Notify if symptoms persist despite rescue inhaler/neb use.  Continue cetirizine 1 tab daily Switch Stiolto to Anoro 1 puff daily  Continue supplemental oxygen  2 lpm for goal oxygen  >88-90%  Flu shot today  Pneumonia shot next year since it will be 10 years    Follow up with Dr. Neysa in 6 months. If symptoms do not improve or worsen, please contact office for sooner follow up or seek emergency care.   Chronic respiratory failure with hypoxia (HCC) Stable without increased O2 requirement. She was able to maintain saturations on room air at OV. Goal >88-90%  Malignant neoplasm of lung (HCC) No evidence of recurrence.       I spent 25 minutes of dedicated to the care of this patient on the date of this encounter to include pre-visit review of records, face-to-face time with the patient discussing conditions above, post visit ordering of testing, clinical documentation with the electronic health record, making appropriate referrals as documented, and communicating necessary findings to members of the patients care team.  Adrienne LULLA Rouleau, NP 04/21/2024  Pt aware and understands NP's role.  "

## 2024-04-21 ENCOUNTER — Other Ambulatory Visit (HOSPITAL_BASED_OUTPATIENT_CLINIC_OR_DEPARTMENT_OTHER): Payer: Self-pay

## 2024-04-21 ENCOUNTER — Encounter: Payer: Self-pay | Admitting: Nurse Practitioner

## 2024-04-21 MED ORDER — HYDROCOD POLI-CHLORPHE POLI ER 10-8 MG/5ML PO SUER
5.0000 mL | Freq: Two times a day (BID) | ORAL | 0 refills | Status: DC | PRN
  Filled 2024-04-21: qty 180, 18d supply, fill #0

## 2024-04-21 NOTE — Assessment & Plan Note (Signed)
 No evidence of recurrence

## 2024-04-21 NOTE — Assessment & Plan Note (Signed)
 Compensated on current regimen. DOE multifactorial related to COPD, deconditioning, obesity. Will trial change from Stiolto to Anoro as this will be easier for her to manage with arthritis in her hands. Action plan in place. Encouraged to work on graded exercises.  Patient Instructions  Continue Albuterol  inhaler 2 puffs or 3 mL neb every 6 hours as needed for shortness of breath or wheezing. Notify if symptoms persist despite rescue inhaler/neb use.  Continue cetirizine 1 tab daily Switch Stiolto to Anoro 1 puff daily  Continue supplemental oxygen  2 lpm for goal oxygen  >88-90%  Flu shot today  Pneumonia shot next year since it will be 10 years    Follow up with Dr. Neysa in 6 months. If symptoms do not improve or worsen, please contact office for sooner follow up or seek emergency care.

## 2024-04-21 NOTE — Assessment & Plan Note (Signed)
Stable without increased O2 requirement. She was able to maintain saturations on room air at OV. Goal >88-90%

## 2024-04-24 ENCOUNTER — Other Ambulatory Visit (HOSPITAL_BASED_OUTPATIENT_CLINIC_OR_DEPARTMENT_OTHER): Payer: Self-pay

## 2024-04-26 ENCOUNTER — Other Ambulatory Visit: Payer: Self-pay | Admitting: Nurse Practitioner

## 2024-04-26 DIAGNOSIS — J449 Chronic obstructive pulmonary disease, unspecified: Secondary | ICD-10-CM

## 2024-04-26 NOTE — Telephone Encounter (Unsigned)
 Copied from CRM [id number]. Topic: Clinical - Medication Refill >> Apr 26, 2024  4:13 PM Rozanna G wrote: Medication: chlorpheniramine -HYDROcodone  (TUSSIONEX) 10-8 MG/5ML  Has the patient contacted their pharmacy? Yes (Agent: If no, request that the patient contact the pharmacy for the refill. If patient does not wish to contact the pharmacy document the reason why and proceed with request.) (Agent: If yes, when and what did the pharmacy advise?)  This is the patient's preferred pharmacy:  [id number] [place], [place] - [address] AT [facility] [address] [place] [address] Phone: [phone] Fax: [phone]    Is this the correct pharmacy for this prescription? Yes If no, delete pharmacy and type the correct one.   Has the prescription been filled recently? No  Is the patient out of the medication? No  Has the patient been seen for an appointment in the last year OR does the patient have an upcoming appointment? Yes  Can we respond through MyChart? No  Agent: Please be advised that Rx refills may take up to 3 business days. We ask that you follow-up with your pharmacy.

## 2024-04-27 ENCOUNTER — Other Ambulatory Visit (HOSPITAL_COMMUNITY): Payer: Self-pay

## 2024-04-27 ENCOUNTER — Telehealth: Payer: Self-pay

## 2024-04-27 NOTE — Telephone Encounter (Signed)
*  Pulm  Pharmacy Patient Advocate Encounter   Received notification from Pt Calls Messages that prior authorization for Anoro Ellipta  is required/requested.   Insurance verification completed.   The patient is insured through [employer].   Per test claim: PA required; PA submitted to above mentioned insurance via Latent Key/confirmation #/EOC [id number] Status is pending   *added to PA: She is unable to operate the Respimat style inhaler and turning the device due to arthritis, which was why we changed to the DPI as this will be easy for her to operate.

## 2024-04-27 NOTE — Telephone Encounter (Signed)
 noted

## 2024-04-27 NOTE — Telephone Encounter (Signed)
 PA team, please submit Anoro PA.

## 2024-04-27 NOTE — Telephone Encounter (Signed)
 Pharmacy Patient Advocate Encounter  Received notification from [employer] that Prior Authorization for Anoro Ellipta  has been APPROVED from 04/27/2024 to 07/19/2025

## 2024-04-27 NOTE — Telephone Encounter (Signed)
 She is unable to operate the Respimat style inhaler and turning the device due to arthritis, which was why we changed to the DPI as this will be easy for her to operate. May need to send PA with these details. Thanks.

## 2024-04-27 NOTE — Telephone Encounter (Signed)
 Katie, please advise.   Stiolto is preferred with a zero dollar co pay, however patient was previously taking stiolto.  How would you like to proceed?

## 2024-04-28 MED ORDER — HYDROCOD POLI-CHLORPHE POLI ER 10-8 MG/5ML PO SUER: 5.0000 mL | Freq: Two times a day (BID) | ORAL | 0 refills | Status: AC | PRN

## 2024-04-28 NOTE — Telephone Encounter (Signed)
 Copied from CRM [id number]. Topic: Clinical - Prescription Issue >> Apr 24, 2024  1:27 PM Rozanna MATSU wrote: Reason for CRM: Pt stated she did not ask for her Rx #: [id number]  chlorpheniramine -HYDROcodone  (TUSSIONEX) 10-8 MG/5ML [[id number]]   [facility]  [address] [place], [place] [address] [phone] >> Apr 26, 2024  1:22 PM Russell PARAS wrote: Pt is contacting clinic regarding her refill request for Tussionex. Refill was sent to [facility]; however, pt [facility] [address] [facility] on [facility] in her chart  Left a message 10/6 and had not had a response from clinic. Reviewed chart and [facility]  Pt requested call back for status update, she is frustrated her message on 10/6 was not responded to.   [phone]   Katie can you please advise, pt is requesting different pharmacy.  Rx pended

## 2024-04-28 NOTE — Telephone Encounter (Signed)
 [facility]. PDMP reviewed and prior rx never filled at [facility]. Please call them to cancel rx. Thanks.

## 2024-05-03 ENCOUNTER — Telehealth: Payer: Self-pay | Admitting: *Deleted

## 2024-05-03 DIAGNOSIS — J9611 Chronic respiratory failure with hypoxia: Secondary | ICD-10-CM | POA: Diagnosis not present

## 2024-05-03 DIAGNOSIS — J449 Chronic obstructive pulmonary disease, unspecified: Secondary | ICD-10-CM | POA: Diagnosis not present

## 2024-05-03 NOTE — Telephone Encounter (Signed)
 Returned call to [facility], spoke with [employer] and notified of auth. She was unable to run test claim but if anything further needed they will call back. Closing message. Miller, Juliana M, CPhT    04/27/24  1:22 PM Note Pharmacy Patient Advocate Encounter   Received notification from [employer] that Prior Authorization for Anoro Ellipta  has been APPROVED from 04/27/2024 to 07/19/2025          Copied from CRM #[id number]. Topic: General - Other >> May 01, 2024 10:42 AM Rilla NOVAK wrote: Reason for CRM:  Danelle @ [facility] calling regarding umeclidinium-vilanterol (ANORO ELLIPTA ) 62.5-25 MCG/ACT AEPB.  States medication is non formulary for patient.  Needs approval for alternative.  Please call [phone].

## 2024-05-10 NOTE — Telephone Encounter (Signed)
 [facility], was left on hold for 13 minutes.

## 2024-05-22 ENCOUNTER — Ambulatory Visit: Admitting: Podiatry

## 2024-05-28 ENCOUNTER — Other Ambulatory Visit: Payer: Self-pay | Admitting: Internal Medicine

## 2024-05-29 ENCOUNTER — Ambulatory Visit (INDEPENDENT_AMBULATORY_CARE_PROVIDER_SITE_OTHER): Admitting: Podiatry

## 2024-05-29 DIAGNOSIS — Z91199 Patient's noncompliance with other medical treatment and regimen due to unspecified reason: Secondary | ICD-10-CM

## 2024-05-29 NOTE — Progress Notes (Signed)
 Cancel 24 hours

## 2024-06-03 DIAGNOSIS — J449 Chronic obstructive pulmonary disease, unspecified: Secondary | ICD-10-CM | POA: Diagnosis not present

## 2024-08-23 ENCOUNTER — Ambulatory Visit (INDEPENDENT_AMBULATORY_CARE_PROVIDER_SITE_OTHER): Payer: Self-pay | Admitting: Podiatry

## 2024-08-23 DIAGNOSIS — Z91199 Patient's noncompliance with other medical treatment and regimen due to unspecified reason: Secondary | ICD-10-CM

## 2024-08-23 NOTE — Progress Notes (Signed)
 No show

## 2024-10-24 ENCOUNTER — Ambulatory Visit: Admitting: Dermatology
